# Patient Record
Sex: Male | Born: 1950 | Race: Black or African American | Hispanic: No | Marital: Single | Smoking: Current every day smoker
Health system: Southern US, Community
[De-identification: ages and names within clinical notes are randomized; demographics above are authoritative.]

## PROBLEM LIST (undated history)

## (undated) DIAGNOSIS — I10 Essential (primary) hypertension: Secondary | ICD-10-CM

## (undated) DIAGNOSIS — J9611 Chronic respiratory failure with hypoxia: Secondary | ICD-10-CM

## (undated) DIAGNOSIS — F209 Schizophrenia, unspecified: Secondary | ICD-10-CM

## (undated) DIAGNOSIS — J449 Chronic obstructive pulmonary disease, unspecified: Secondary | ICD-10-CM

## (undated) HISTORY — PX: OTHER SURGICAL HISTORY: SHX169

## (undated) HISTORY — DX: Essential (primary) hypertension: I10

## (undated) HISTORY — PX: COLONOSCOPY: SHX174

## (undated) HISTORY — DX: Schizophrenia, unspecified: F20.9

---

## 2000-12-11 ENCOUNTER — Encounter: Payer: Self-pay | Admitting: Cardiology

## 2000-12-11 ENCOUNTER — Ambulatory Visit (HOSPITAL_COMMUNITY): Admission: RE | Admit: 2000-12-11 | Discharge: 2000-12-11 | Payer: Self-pay | Admitting: Cardiology

## 2001-03-12 ENCOUNTER — Encounter: Payer: Self-pay | Admitting: Emergency Medicine

## 2001-03-12 ENCOUNTER — Emergency Department (HOSPITAL_COMMUNITY): Admission: EM | Admit: 2001-03-12 | Discharge: 2001-03-12 | Payer: Self-pay | Admitting: Emergency Medicine

## 2001-03-12 ENCOUNTER — Encounter: Payer: Self-pay | Admitting: Orthopedic Surgery

## 2001-03-28 ENCOUNTER — Emergency Department (HOSPITAL_COMMUNITY): Admission: EM | Admit: 2001-03-28 | Discharge: 2001-03-28 | Payer: Self-pay | Admitting: Emergency Medicine

## 2001-04-09 ENCOUNTER — Inpatient Hospital Stay (HOSPITAL_COMMUNITY): Admission: EM | Admit: 2001-04-09 | Discharge: 2001-04-10 | Payer: Self-pay

## 2001-06-17 ENCOUNTER — Encounter (HOSPITAL_COMMUNITY): Admission: RE | Admit: 2001-06-17 | Discharge: 2001-07-17 | Payer: Self-pay | Admitting: Orthopedic Surgery

## 2002-04-02 ENCOUNTER — Ambulatory Visit (HOSPITAL_COMMUNITY): Admission: RE | Admit: 2002-04-02 | Discharge: 2002-04-02 | Payer: Self-pay | Admitting: Internal Medicine

## 2002-12-24 ENCOUNTER — Other Ambulatory Visit: Payer: Self-pay

## 2003-02-14 ENCOUNTER — Other Ambulatory Visit: Payer: Self-pay

## 2003-05-03 ENCOUNTER — Other Ambulatory Visit: Payer: Self-pay

## 2003-11-27 ENCOUNTER — Emergency Department: Payer: Self-pay | Admitting: Unknown Physician Specialty

## 2003-12-11 ENCOUNTER — Emergency Department: Payer: Self-pay | Admitting: Emergency Medicine

## 2004-01-24 ENCOUNTER — Emergency Department: Payer: Self-pay | Admitting: Emergency Medicine

## 2005-10-22 ENCOUNTER — Emergency Department (HOSPITAL_COMMUNITY): Admission: EM | Admit: 2005-10-22 | Discharge: 2005-10-23 | Payer: Self-pay | Admitting: Emergency Medicine

## 2005-10-23 ENCOUNTER — Emergency Department (HOSPITAL_COMMUNITY): Admission: EM | Admit: 2005-10-23 | Discharge: 2005-10-23 | Payer: Self-pay | Admitting: Emergency Medicine

## 2008-04-27 ENCOUNTER — Emergency Department: Payer: Self-pay | Admitting: Emergency Medicine

## 2008-06-21 ENCOUNTER — Ambulatory Visit: Payer: Self-pay | Admitting: Family Medicine

## 2008-12-01 ENCOUNTER — Inpatient Hospital Stay: Payer: Self-pay | Admitting: Psychiatry

## 2009-07-23 ENCOUNTER — Emergency Department: Payer: Self-pay | Admitting: Emergency Medicine

## 2012-01-31 ENCOUNTER — Emergency Department: Payer: Self-pay | Admitting: Emergency Medicine

## 2012-01-31 LAB — RAPID INFLUENZA A&B ANTIGENS

## 2013-05-22 ENCOUNTER — Emergency Department: Payer: Self-pay | Admitting: Emergency Medicine

## 2013-05-22 LAB — COMPREHENSIVE METABOLIC PANEL
Albumin: 3.7 g/dL (ref 3.4–5.0)
Alkaline Phosphatase: 114 U/L
Anion Gap: 3 — ABNORMAL LOW (ref 7–16)
BUN: 9 mg/dL (ref 7–18)
Bilirubin,Total: 0.3 mg/dL (ref 0.2–1.0)
CO2: 32 mmol/L (ref 21–32)
CREATININE: 0.92 mg/dL (ref 0.60–1.30)
Calcium, Total: 9.5 mg/dL (ref 8.5–10.1)
Chloride: 107 mmol/L (ref 98–107)
EGFR (Non-African Amer.): >60
Glucose: 109 mg/dL — ABNORMAL HIGH (ref 65–99)
Osmolality: 282 (ref 275–301)
POTASSIUM: 4.4 mmol/L (ref 3.5–5.1)
SGOT(AST): 34 U/L (ref 15–37)
SGPT (ALT): 44 U/L (ref 12–78)
SODIUM: 142 mmol/L (ref 136–145)
Total Protein: 7.3 g/dL (ref 6.4–8.2)

## 2013-05-22 LAB — CBC
HCT: 47.4 % (ref 40.0–52.0)
HGB: 15.2 g/dL (ref 13.0–18.0)
MCH: 29.1 pg (ref 26.0–34.0)
MCHC: 32.1 g/dL (ref 32.0–36.0)
MCV: 91 fL (ref 80–100)
PLATELETS: 237 10*3/uL (ref 150–440)
RBC: 5.23 10*6/uL (ref 4.40–5.90)
RDW: 13.1 % (ref 11.5–14.5)
WBC: 8.7 10*3/uL (ref 3.8–10.6)

## 2013-05-22 LAB — ACETAMINOPHEN LEVEL

## 2013-05-22 LAB — ETHANOL: Ethanol %: <0.003 % (ref 0.000–0.080)

## 2013-05-22 LAB — SALICYLATE LEVEL: SALICYLATES, SERUM: 3.3 mg/dL — AB

## 2013-05-23 LAB — DRUG SCREEN, URINE

## 2013-05-23 LAB — URINALYSIS, COMPLETE
Bacteria: NONE SEEN
Bilirubin,UR: NEGATIVE
Blood: NEGATIVE
Glucose,UR: NEGATIVE mg/dL (ref 0–75)
KETONE: NEGATIVE
Leukocyte Esterase: NEGATIVE
NITRITE: NEGATIVE
PROTEIN: NEGATIVE
Ph: 6 (ref 4.5–8.0)
RBC,UR: NONE SEEN /HPF (ref 0–5)
Specific Gravity: 1.013 (ref 1.003–1.030)

## 2013-11-09 LAB — COMPREHENSIVE METABOLIC PANEL
Albumin: 4.1 g/dL (ref 3.4–5.0)
Alkaline Phosphatase: 113 U/L
Anion Gap: 5 — ABNORMAL LOW (ref 7–16)
BUN: 9 mg/dL (ref 7–18)
Bilirubin,Total: 0.3 mg/dL (ref 0.2–1.0)
Calcium, Total: 9.3 mg/dL (ref 8.5–10.1)
Chloride: 107 mmol/L (ref 98–107)
Co2: 31 mmol/L (ref 21–32)
Creatinine: 1.1 mg/dL (ref 0.60–1.30)
EGFR (African American): >60
EGFR (Non-African Amer.): >60
Glucose: 94 mg/dL (ref 65–99)
Osmolality: 283 (ref 275–301)
Potassium: 4.3 mmol/L (ref 3.5–5.1)
SGOT(AST): 56 U/L — ABNORMAL HIGH (ref 15–37)
SGPT (ALT): 36 U/L
Sodium: 143 mmol/L (ref 136–145)
Total Protein: 7.2 g/dL (ref 6.4–8.2)

## 2013-11-09 LAB — ETHANOL: Ethanol: <3 mg/dL

## 2013-11-09 LAB — DRUG SCREEN, URINE

## 2013-11-09 LAB — CBC
HCT: 51.5 % (ref 40.0–52.0)
HGB: 16.8 g/dL (ref 13.0–18.0)
MCH: 29.8 pg (ref 26.0–34.0)
MCHC: 32.5 g/dL (ref 32.0–36.0)
MCV: 92 fL (ref 80–100)
Platelet: 220 10*3/uL (ref 150–440)
RBC: 5.62 10*6/uL (ref 4.40–5.90)
RDW: 13.1 % (ref 11.5–14.5)
WBC: 9 10*3/uL (ref 3.8–10.6)

## 2013-11-09 LAB — URINALYSIS, COMPLETE
BACTERIA: NONE SEEN
Bilirubin,UR: NEGATIVE
Blood: NEGATIVE
GLUCOSE, UR: NEGATIVE mg/dL (ref 0–75)
KETONE: NEGATIVE
Leukocyte Esterase: NEGATIVE
Nitrite: NEGATIVE
Ph: 6 (ref 4.5–8.0)
Protein: NEGATIVE
RBC,UR: 1 /HPF (ref 0–5)
Specific Gravity: 1.014 (ref 1.003–1.030)
Squamous Epithelial: <1
WBC UR: <1 /HPF (ref 0–5)

## 2013-11-09 LAB — ACETAMINOPHEN LEVEL

## 2013-11-09 LAB — SALICYLATE LEVEL: Salicylates, Serum: 2.5 mg/dL

## 2013-11-10 ENCOUNTER — Inpatient Hospital Stay: Payer: Self-pay | Admitting: Psychiatry

## 2013-11-17 LAB — LIPID PANEL
Cholesterol: 134 mg/dL (ref 0–200)
HDL Cholesterol: 56 mg/dL (ref 40–60)
Ldl Cholesterol, Calc: 51 mg/dL (ref 0–100)
Triglycerides: 135 mg/dL (ref 0–200)
VLDL CHOLESTEROL, CALC: 27 mg/dL (ref 5–40)

## 2013-11-17 LAB — HEMOGLOBIN A1C: Hemoglobin A1C: 5.5 % (ref 4.2–6.3)

## 2013-11-17 LAB — TSH: Thyroid Stimulating Horm: 0.931 u[IU]/mL

## 2014-04-16 ENCOUNTER — Ambulatory Visit
Admit: 2014-04-16 | Disposition: A | Discharge status: Home / Self Care | Payer: Self-pay | Attending: Family Medicine | Admitting: Family Medicine

## 2014-05-08 NOTE — Consult Note (Signed)
PATIENT NAME:  Carlos Nelson, Carlos Nelson MR#:  [id number] DATE OF BIRTH:  08/25/1950  DATE OF CONSULTATION:  05/23/2013  CONSULTING PHYSICIAN:  Mariavictoria Nottingham K. Franchot Mimes, MD  PLACE OF DICTATION: [facility] Emergency Room/BHU, room #2  AGE: 64 years.  SEX: Male.  RACE: African-American.  SUBJECTIVE: The patient is a 64 year old African-American male, not employed, and has been having mental illness for many years. The patient has been living in his current group home for 5 years. The patient reports that he got into an argument with a staff member. He likes the place that he lives. He reports that he refused to take trazodone because Seroquel helps him and he says Seroquel is a good medicine that helps him rest at night, since we were talking about the same.  He had aggressive behavior at the group home.  OBJECTIVE: The patient is alert and oriented, with prompting and help. Calm, pleasant, and cooperative. Cheerful and smiling. No agitation. He admits that sometimes he hears voices, but he is not going to hurt himself or others. Admits that people try to ask him to do things and according to information, he did have delusional thoughts. Denies ideas or plans to hurt himself or others, but does admit getting agitated and upset when people ask him to do things that he does like. Insight and judgment guarded versus poor.  IMPRESSION: Schizophrenia, chronic, paranoid, in partial remission .  PLAN: Continue to adjust medications as follows: Will add Seroquel 200 mg p.o. q.a.m. in addition to Seroquel 400 mg at bedtime. Mr. Sharen Hones is to evaluate the patient on Monday; that is, 05/25/2013, and call back the group home, as he must be stabilized by that time with the help of increasing Seroquel.     ____________________________ Wallace Cullens. Franchot Mimes, MD skc:lm D: 05/23/2013 15:56:20 ET T: 05/23/2013 21:18:26 ET JOB#: [id number]  cc: Arlyn Leak K. Franchot Mimes, MD, <Dictator> Dewain Penning MD ELECTRONICALLY SIGNED 05/25/2013 1:57

## 2014-05-08 NOTE — H&P (Signed)
PATIENT NAME:  LAVIN, PETTEWAY MR#:  [id number] DATE OF BIRTH:  1950-01-26  DATE OF ADMISSION:  11/10/2013   REFERRING PHYSICIAN:  Emergency room M.D.   ATTENDING PHYSICIAN:  Orson Slick, M.D.   IDENTIFYING DATA:  Mr. Shankman is a 64 year old male with history of schizophrenia.   CHIEF COMPLAINT:  "I'm fine."   HISTORY OF PRESENT ILLNESS:  Mr. Mikhail has been a resident of a group home for the past 5 years. He reportedly stopped taking some of his medications at the group home, namely Haldol, but continued to take Seroquel for the past 60 days. He gradually decompensated and became increasingly psychotic with erratic behavior, hallucinating, argumentative, trying to leave the group home in the middle of the night and hallucinating. The patient has been talking to himself constantly yelling at his voices. He was hard to redirect. The patient also complained of depression and feeling anxious. He denied any thoughts of hurting himself or others. The patient was brought to [facility] Emergency Room and was admitted to psychiatry unit. In the hospital he continues to refuse Haldol, but is ready to take the Seroquel. He says that Haldol makes his mind fuzzy. The patient in general denies any problems. He feels that he is ready to return to the group home any time.   PAST PSYCHIATRIC HISTORY: The patient has multiple hospitalizations at [facility]; last time in 2007. He has been in the care of Dr. Kasandra Knudsen in the past, but is unable to tell me who his psychiatrist is now. He was hospitalized at [facility] in 2010 for a similar presentation, psychotic in the context of treatment noncompliance. At that time he was discharged on a combination of Risperdal, trazodone and metoprolol.  The patient was treated with Geodon in the hospital, but his insurance company did not cover the [place] at that time.  The patient is currently on a combination of Seroquel  and Haldol which he refuses.  There is a history of other medication trials, but the patient is unable to name his medicines. There is a history of substance abuse remote. 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   FAMILY PSYCHIATRIC HISTORY: None reported.   PAST MEDICAL HISTORY: Hypertension, history of positive PPD treated with isoniazid.     ALLERGIES: No known drug allergies.   MEDICATIONS ON ADMISSION: Haldol 10 mg twice daily, Norvasc 10 mg daily, Seroquel 100 mg twice daily plus 400 at bedtime.   SOCIAL HISTORY:  Prior to moving into a group home 5 or so years ago, the patient used to live with his sister and his children, but during times of noncompliance he was aggressive towards his family and he ended up in a group home. Apparently, he has a guardian now, which is our department of social services. He lives on [facility] and is allowed to return there.   REVIEW OF SYSTEMS: CONSTITUTIONAL: No fevers or chills. No weight changes.  EYES: No double or blurred vision.  ENT: No hearing loss.  RESPIRATORY: No shortness of breath or cough.  CARDIOVASCULAR: No chest pain or orthopnea.  GASTROINTESTINAL: No abdominal pain, nausea, vomiting or diarrhea.  GENITOURINARY: No incontinence or frequency.  ENDOCRINE: No heat or cold intolerance.  LYMPHATIC: No anemia or easy bruising.  INTEGUMENTARY: No acne or rash.  MUSCULOSKELETAL: No muscle or joint pain.  NEUROLOGIC: No tingling or weakness.  PSYCHIATRIC:  See history of  present illness for details.   PHYSICAL EXAMINATION: VITAL SIGNS: Blood pressure 131/84, pulse 109, respirations 20, temperature 98.5.  GENERAL: This is a well-developed elderly gentleman in no acute distress.  HEENT: The pupils are equal, round and reactive to light. Sclerae anicteric.  NECK: Supple. No thyromegaly.  LUNGS: Clear to auscultation. No dullness to percussion.  HEART: Regular rhythm and rate. No murmurs,  rubs or gallops.  ABDOMEN: Soft, nontender, nondistended. Positive bowel sounds.  MUSCULOSKELETAL:  Normal muscle strength in all extremities.  SKIN: No rashes or bruises.  LYMPHATIC: No cervical adenopathy.  NEUROLOGIC: Cranial nerves II through XII are intact.   LABORATORY DATA: Chemistries are within normal limits. Blood alcohol level is 0. LFTs within normal limits except for AST of 56. Urine tox screen negative for substances.   CBC within normal limits.   Urinalysis is not suggestive of urinary tract infection. Serum acetaminophen and salicylates are low.   MENTAL STATUS EXAMINATION:  On admission the patient is alert and oriented to person and place only. He is super disorganized, rambling. Speech is very difficult to understand. Thinking is disorganized. The patient is really unable to answer most of our questions.  He makes it sure that we understand that he does not want to take Haldol. Seroquel is okay.  He will negotiate other medicines, but never Haldol. He denies thoughts of hurting himself or others. He denies delusions, paranoia or hallucinations but has been observed talking to himself. He is rather restless on the unit walking up and down the hall. Tries to participate in groups, but it does not go well. He complains of feeling anxious and believes that Haldol makes it even worse. His cognition is impossible to assess. He is probably of average intelligence and fund of knowledge. His insight and judgment are extremely poor.   SUICIDE RISK ASSESSMENT:  This is a patient with a long history of psychosis, possibly mood instability, who came to the hospital floridly psychotic in the context of medication noncompliance.   INITIAL DIAGNOSIS:  AXIS I: Schizophrenia.   AXIS II: None.   AXIS III: Hypertension.   PLAN: The patient was admitted to [facility] for safety, stabilization and medication management. He was initially placed on  suicide precautions and was closely monitored for any unsafe behaviors. He underwent full psychiatric and risk assessment. He receives pharmacotherapy, individual and group psychotherapy, substance abuse counseling, and support from therapeutic milieu.    1.  Psychosis: We will continue Seroquel and restart Risperdal that was useful in the past.  It is not Haldol.  2.  Insomnia: In the past the patient had enormous difficulty sleeping.  Will monitor and recommend other sleeping aid or increase Seroquel at night if necessary.  3.  Hypertension. We will continue antihypertensive.   DISPOSITION: He will likely return to the group home. Will have to contact the guardian.    ____________________________ Wardell Honour. Bary Leriche, MD jbp:jw D: 11/10/2013 17:42:00 ET T: 11/10/2013 19:32:34 ET JOB#: [id number]  cc: Jolanta B. Bary Leriche, MD, <Dictator> Clovis Fredrickson MD ELECTRONICALLY SIGNED 11/23/2013 6:27

## 2014-05-08 NOTE — Consult Note (Signed)
Brief Consult Note: Diagnosis: Schizophrenia, Paranoid Type.   Patient was seen by consultant.   Recommend further assessment or treatment.   Orders entered.   Comments: Pt seen for follow up. He appeared anxious and has somewhat rambling speech. Stated that he was able to sleep well last night. Does not like the Trazodone. He has some conflict at the group home. He is not having any aggressive behavior at this time. He will be evaluated by the staff from group home. He denied Si/HI or plans.   Plan.  Adjust his meds Seroquel 100mg  po BID and 400mg  po qhs.  D/C Trazodone.  Will d/c when stable.  Electronic Signatures: Jeronimo Norma (MD)  (Signed 12-May-15 10:34)  Authored: Brief Consult Note   Last Updated: 12-May-15 10:34 by Jeronimo Norma (MD)

## 2015-05-24 ENCOUNTER — Ambulatory Visit (INDEPENDENT_AMBULATORY_CARE_PROVIDER_SITE_OTHER): Payer: Medicare Other | Admitting: Urology

## 2015-05-24 ENCOUNTER — Encounter: Payer: Self-pay | Admitting: Urology

## 2015-05-24 VITALS — BP 174/100 | HR 102 | Ht 64.0 in | Wt 127.3 lb

## 2015-05-24 DIAGNOSIS — R972 Elevated prostate specific antigen [PSA]: Secondary | ICD-10-CM

## 2015-05-24 MED ORDER — FLEET ENEMA 7-19 GM/118ML RE ENEM: 1.0000 | ENEMA | Freq: Once | RECTAL | Status: DC

## 2015-05-24 MED ORDER — SULFAMETHOXAZOLE-TRIMETHOPRIM 800-160 MG PO TABS: 1.0000 | ORAL_TABLET | Freq: Two times a day (BID) | ORAL | Status: DC

## 2015-05-24 NOTE — Progress Notes (Signed)
05/24/2015 3:49 PM   [name] 1950-10-20 [id number]  Referring provider: No referring provider defined for this encounter.  Chief Complaint  Patient presents with  . New Patient (Initial Visit)    elevated PSA    HPI:  1 - Elevated PSA - No FHX prostate cancer. PSA 4.9 03/2015 by PCP labs, recheck still elevated at 4.8 04/2015. DRE 05/2015 20gm and diffusely firm.   PMH sig for schizophrenia / bipolar / lives in assisted living (only for med monitoring, independent in all ADL's and is his own POA), COPD (not limiting), HTN. NO CV disease / blood thinners. No chest or abdominal surgeries.  Today "Nicole Kindred" is seen as new patient for above.     PMH: Past Medical History  Diagnosis Date  . Hypertension   . Schizophrenia [facility])     Surgical History: No past surgical history on file.  Home Medications:    Medication List       This list is accurate as of: 05/24/15  3:49 PM.  Always use your most recent med list.               albuterol 108 (90 Base) MCG/ACT inhaler  Commonly known as:  PROVENTIL HFA;VENTOLIN HFA  Inhale 1 puff into the lungs every 6 (six) hours as needed for wheezing or shortness of breath.     amLODipine 10 MG tablet  Commonly known as:  NORVASC  Take 10 mg by mouth daily.     lisinopril 10 MG tablet  Commonly known as:  PRINIVIL,ZESTRIL  Take 10 mg by mouth daily.     LORazepam 1 MG tablet  Commonly known as:  ATIVAN  Take 1 mg by mouth every 6 (six) hours as needed for anxiety.     metoprolol 50 MG tablet  Commonly known as:  LOPRESSOR  Take 50 mg by mouth 2 (two) times daily.     QUEtiapine 25 MG tablet  Commonly known as:  SEROQUEL  Take 25 mg by mouth at bedtime.     QUEtiapine 400 MG 24 hr tablet  Commonly known as:  SEROQUEL XR  Take 400 mg by mouth at bedtime.     traZODone 50 MG tablet  Commonly known as:  DESYREL  Take 50 mg by mouth at bedtime.     trihexyphenidyl 5 MG tablet  Commonly known as:  ARTANE  Take 5 mg  by mouth 3 (three) times daily with meals.     umeclidinium-vilanterol 62.5-25 MCG/INH Aepb  Commonly known as:  ANORO ELLIPTA  Inhale 1 puff into the lungs daily.        Allergies: No Known Allergies  Family History: No family history on file.  Social History:  has no tobacco, alcohol, and drug history on file.    Review of Systems  Gastrointestinal (upper)  : Negative for upper GI symptoms  Gastrointestinal (lower) : Negative for lower GI symptoms  Constitutional : Negative for symptoms  Skin: Negative for skin symptoms  Eyes: Negative for eye symptoms  Ear/Nose/Throat : Negative for Ear/Nose/Throat symptoms  Hematologic/Lymphatic: Negative for Hematologic/Lymphatic symptoms  Cardiovascular : Negative for cardiovascular symptoms  Respiratory : Negative for respiratory symptoms  Endocrine: Negative for endocrine symptoms  Musculoskeletal: Negative for musculoskeletal symptoms  Neurological: Negative for neurological symptoms  Psychologic: Negative for psychiatric symptoms  Physical Exam: There were no vitals taken for this visit.  Constitutional:  Alert and oriented, No acute distress. HEENT: [place] AT, moist mucus membranes.  Trachea midline, no masses. Cardiovascular: No clubbing, cyanosis, or edema. Respiratory: Normal respiratory effort, no increased work of breathing. GI: Abdomen is soft, nontender, nondistended, no abdominal masses GU: No CVA tenderness. DRE 20gm difusely firm, no frank nodules. Non-fixed.  Skin: No rashes, bruises or suspicious lesions. Lymph: No cervical or inguinal adenopathy. Neurologic: Grossly intact, no focal deficits, moving all 4 extremities. Psychiatric: Normal mood and affect.  Laboratory Data: Lab Results  Component Value Date   WBC 9.0 11/09/2013   HGB 16.8 11/09/2013   HCT 51.5 11/09/2013   MCV 92 11/09/2013   PLT 220 11/09/2013    Lab Results   Component Value Date   CREATININE 1.10 11/09/2013    No results found for: PSA  No results found for: TESTOSTERONE  Lab Results  Component Value Date   HGBA1C 5.5 11/17/2013    Urinalysis    Component Value Date/Time   COLORURINE Yellow 11/09/2013 1525   APPEARANCEUR Clear 11/09/2013 1525   LABSPEC 1.014 11/09/2013 1525   PHURINE 6.0 11/09/2013 1525   GLUCOSEU Negative 11/09/2013 1525   HGBUR Negative 11/09/2013 1525   BILIRUBINUR Negative 11/09/2013 1525   KETONESUR Negative 11/09/2013 1525   PROTEINUR Negative 11/09/2013 1525   NITRITE Negative 11/09/2013 1525   LEUKOCYTESUR Negative 11/09/2013 1525     Assessment & Plan:    1 - Elevated PSA - significant and persistant elevation in man with well controlled comorbidity. Dissussed options of surveillance, biopsy and he opts for biopsy. RIsks, benefits discussed as well as BX course and natural history of prostate cancer.  Bactrim and fleet's [id number] as he will get through facility.  2 - RTC next avail prostate biopsy.     Alexis Frock, [facility] [address] [place], [place] [address] [phone]

## 2015-06-16 ENCOUNTER — Ambulatory Visit: Payer: Medicare Other

## 2015-06-21 ENCOUNTER — Encounter: Payer: Self-pay | Admitting: Urology

## 2015-06-21 ENCOUNTER — Other Ambulatory Visit: Payer: Self-pay | Admitting: Urology

## 2015-06-21 ENCOUNTER — Ambulatory Visit (INDEPENDENT_AMBULATORY_CARE_PROVIDER_SITE_OTHER): Payer: Medicare Other | Admitting: Urology

## 2015-06-21 VITALS — BP 130/83 | HR 91 | Ht 64.0 in | Wt 120.0 lb

## 2015-06-21 DIAGNOSIS — R972 Elevated prostate specific antigen [PSA]: Secondary | ICD-10-CM

## 2015-06-21 MED ORDER — GENTAMICIN SULFATE 40 MG/ML IJ SOLN
80.0000 mg | Freq: Once | INTRAMUSCULAR | Status: AC
  Administered 2015-06-21: 80 mg via INTRAMUSCULAR

## 2015-06-21 NOTE — Progress Notes (Signed)
05/24/2015 3:49 PM   Carlos Nelson 1950/03/28 [id number]  Referring provider: No referring provider defined for this encounter.  Chief Complaint  Patient presents with  . New Patient (Initial Visit)    elevated PSA    HPI:  1 - Elevated PSA - No FHX prostate cancer. PSA 4.9 03/2015 by PCP labs, recheck still elevated at 4.8 04/2015. DRE 05/2015 20gm and diffusely firm. PBX 06/2015 TRUS 35 gm no median lobe.   PMH sig for schizophrenia / bipolar / lives in assisted living (only for med monitoring, independent in all ADL's and is his own POA), COPD (not limiting), HTN. NO CV disease / blood thinners. No chest or abdominal surgeries.  Today "Carlos Nelson" is seen to proceed with prostate biopsy. He took ABX and enema as RX'd.     PMH: Past Medical History  Diagnosis Date  . Hypertension   . Schizophrenia [facility])     Surgical History: No past surgical history on file.  Home Medications:    Medication List       This list is accurate as of: 05/24/15  3:49 PM.  Always use your most recent med list.               albuterol 108 (90 Base) MCG/ACT inhaler  Commonly known as:  PROVENTIL HFA;VENTOLIN HFA  Inhale 1 puff into the lungs every 6 (six) hours as needed for wheezing or shortness of breath.     amLODipine 10 MG tablet  Commonly known as:  NORVASC  Take 10 mg by mouth daily.     lisinopril 10 MG tablet  Commonly known as:  PRINIVIL,ZESTRIL  Take 10 mg by mouth daily.     LORazepam 1 MG tablet  Commonly known as:  ATIVAN  Take 1 mg by mouth every 6 (six) hours as needed for anxiety.     metoprolol 50 MG tablet  Commonly known as:  LOPRESSOR  Take 50 mg by mouth 2 (two) times daily.     QUEtiapine 25 MG tablet  Commonly known as:  SEROQUEL  Take 25 mg by mouth at bedtime.     QUEtiapine 400 MG 24 hr tablet  Commonly known as:  SEROQUEL XR  Take 400 mg by mouth at bedtime.     traZODone 50 MG tablet  Commonly known as:  DESYREL  Take 50 mg by mouth at  bedtime.     trihexyphenidyl 5 MG tablet  Commonly known as:  ARTANE  Take 5 mg by mouth 3 (three) times daily with meals.     umeclidinium-vilanterol 62.5-25 MCG/INH Aepb  Commonly known as:  ANORO ELLIPTA  Inhale 1 puff into the lungs daily.        Allergies: No Known Allergies  Family History: No family history on file.  Social History:  has no tobacco, alcohol, and drug history on file.    Review of Systems  Gastrointestinal (upper)  : Negative for upper GI symptoms  Gastrointestinal (lower) : Negative for lower GI symptoms  Constitutional : Negative for symptoms  Skin: Negative for skin symptoms  Eyes: Negative for eye symptoms  Ear/Nose/Throat : Negative for Ear/Nose/Throat symptoms  Hematologic/Lymphatic: Negative for Hematologic/Lymphatic symptoms  Cardiovascular : Negative for cardiovascular symptoms  Respiratory : Negative for respiratory symptoms  Endocrine: Negative for endocrine symptoms  Musculoskeletal: Negative for musculoskeletal symptoms  Neurological: Negative for neurological symptoms  Psychologic: Negative for psychiatric symptoms            Physical Exam: There  were no vitals taken for this visit.  Constitutional:  Alert and oriented, No acute distress. HEENT: [place] AT, moist mucus membranes.  Trachea midline, no masses. Cardiovascular: No clubbing, cyanosis, or edema. Respiratory: Normal respiratory effort, no increased work of breathing. GI: Abdomen is soft, nontender, nondistended, no abdominal masses GU: No CVA tenderness. DRE 20gm difusely firm, no frank nodules. Non-fixed.  Skin: No rashes, bruises or suspicious lesions. Lymph: No cervical or inguinal adenopathy. Neurologic: Grossly intact, no focal deficits, moving all 4 extremities. Psychiatric: Normal mood and affect.   Prostate Biopsy Procedure   Informed consent was obtained after discussing risks/benefits of the procedure.  A time out was performed to  ensure correct patient identity.  Pre-Procedure: - Last PSA Level: No results found for: PSA - Gentamicin given prophylactically  -Transrectal Ultrasound performed revealing a 35 gm prostate with no median lobe -No significant hypoechoic or median lobe noted  Procedure: - Prostate block performed using 10 cc 1% lidocaine and biopsies taken from sextant areas, a total of 12 under ultrasound guidance.  Post-Procedure: - Patient tolerated the procedure well - He was counseled to seek immediate medical attention if experiences any severe pain, significant bleeding, or fevers - Return in one week to discuss biopsy results    Assessment & Plan:    1 - Elevated PSA - s/p BX today as per above. Warned to contact MD ofr new fevers or urinary retnetion. Expected hematuria / hematochezia discussed.   2 - RTC 2-3 weeks any MD for post-biopsy discussion    Alexis Frock, [place] [address], [place], [place] [address] [phone]

## 2015-06-24 LAB — PATHOLOGY REPORT

## 2015-06-27 ENCOUNTER — Other Ambulatory Visit: Payer: Self-pay | Admitting: Urology

## 2015-07-05 ENCOUNTER — Telehealth: Payer: Self-pay

## 2015-07-05 ENCOUNTER — Ambulatory Visit: Payer: Medicare Other | Admitting: Urology

## 2015-07-05 NOTE — Telephone Encounter (Signed)
Per Dr. Louis Meckel called patient to notify of prostate biopsy results, patient is in [facility], spoke with Mariann Laster (patient's RN) and notified her that results were negative with one core showing High grade PIN. Patient's apt today can be cancelled and rescheduled for 70months with a PSA. She verbalized understanding and stated that Shanon Brow patient's driver would call back to schedule follow up apt.

## 2015-12-13 ENCOUNTER — Other Ambulatory Visit: Payer: Self-pay

## 2015-12-13 DIAGNOSIS — R972 Elevated prostate specific antigen [PSA]: Secondary | ICD-10-CM

## 2015-12-15 ENCOUNTER — Encounter: Payer: Self-pay | Admitting: Urology

## 2015-12-15 ENCOUNTER — Other Ambulatory Visit: Payer: Medicare Other

## 2015-12-19 ENCOUNTER — Ambulatory Visit: Payer: Medicare Other | Admitting: Urology

## 2015-12-19 ENCOUNTER — Encounter: Payer: Self-pay | Admitting: Urology

## 2016-12-12 ENCOUNTER — Encounter: Payer: Self-pay | Admitting: *Deleted

## 2016-12-13 ENCOUNTER — Ambulatory Visit
Admission: RE | Admit: 2016-12-13 | Discharge: 2016-12-13 | Disposition: A | Discharge status: Home / Self Care | Payer: Medicare Other | Source: Ambulatory Visit | Attending: Gastroenterology | Admitting: Gastroenterology

## 2016-12-13 ENCOUNTER — Ambulatory Visit: Payer: Medicare Other | Admitting: Anesthesiology

## 2016-12-13 ENCOUNTER — Encounter: Payer: Self-pay | Admitting: *Deleted

## 2016-12-13 ENCOUNTER — Encounter
Admission: RE | Disposition: A | Discharge status: Home / Self Care | Payer: Self-pay | Source: Ambulatory Visit | Attending: Gastroenterology

## 2016-12-13 DIAGNOSIS — F209 Schizophrenia, unspecified: Secondary | ICD-10-CM | POA: Insufficient documentation

## 2016-12-13 DIAGNOSIS — D124 Benign neoplasm of descending colon: Secondary | ICD-10-CM | POA: Diagnosis not present

## 2016-12-13 DIAGNOSIS — I10 Essential (primary) hypertension: Secondary | ICD-10-CM | POA: Insufficient documentation

## 2016-12-13 DIAGNOSIS — I6930 Unspecified sequelae of cerebral infarction: Secondary | ICD-10-CM | POA: Insufficient documentation

## 2016-12-13 DIAGNOSIS — Q438 Other specified congenital malformations of intestine: Secondary | ICD-10-CM | POA: Insufficient documentation

## 2016-12-13 DIAGNOSIS — F17200 Nicotine dependence, unspecified, uncomplicated: Secondary | ICD-10-CM | POA: Insufficient documentation

## 2016-12-13 DIAGNOSIS — K635 Polyp of colon: Secondary | ICD-10-CM | POA: Diagnosis not present

## 2016-12-13 DIAGNOSIS — K210 Gastro-esophageal reflux disease with esophagitis: Secondary | ICD-10-CM | POA: Diagnosis not present

## 2016-12-13 DIAGNOSIS — Z1211 Encounter for screening for malignant neoplasm of colon: Secondary | ICD-10-CM | POA: Diagnosis present

## 2016-12-13 DIAGNOSIS — J449 Chronic obstructive pulmonary disease, unspecified: Secondary | ICD-10-CM | POA: Insufficient documentation

## 2016-12-13 DIAGNOSIS — Z79899 Other long term (current) drug therapy: Secondary | ICD-10-CM | POA: Diagnosis not present

## 2016-12-13 HISTORY — PX: ESOPHAGOGASTRODUODENOSCOPY (EGD) WITH PROPOFOL: SHX5813

## 2016-12-13 HISTORY — PX: COLONOSCOPY WITH PROPOFOL: SHX5780

## 2016-12-13 SURGERY — ESOPHAGOGASTRODUODENOSCOPY (EGD) WITH PROPOFOL
Anesthesia: General

## 2016-12-13 MED ORDER — PROPOFOL 500 MG/50ML IV EMUL
INTRAVENOUS | Status: AC
  Filled 2016-12-13: qty 50

## 2016-12-13 MED ORDER — EPHEDRINE SULFATE 50 MG/ML IJ SOLN
INTRAMUSCULAR | Status: DC | PRN
  Administered 2016-12-13: 10 mg via INTRAVENOUS

## 2016-12-13 MED ORDER — PROPOFOL 500 MG/50ML IV EMUL
INTRAVENOUS | Status: DC | PRN
  Administered 2016-12-13: 10 ug/kg/min via INTRAVENOUS

## 2016-12-13 MED ORDER — FENTANYL CITRATE (PF) 100 MCG/2ML IJ SOLN
INTRAMUSCULAR | Status: DC | PRN
  Administered 2016-12-13: 50 ug via INTRAVENOUS

## 2016-12-13 MED ORDER — LIDOCAINE HCL (CARDIAC) 20 MG/ML IV SOLN
INTRAVENOUS | Status: DC | PRN
  Administered 2016-12-13: 100 mg via INTRAVENOUS

## 2016-12-13 MED ORDER — MIDAZOLAM HCL 2 MG/2ML IJ SOLN
INTRAMUSCULAR | Status: DC | PRN
  Administered 2016-12-13: 1 mg via INTRAVENOUS

## 2016-12-13 MED ORDER — MIDAZOLAM HCL 2 MG/2ML IJ SOLN
INTRAMUSCULAR | Status: AC
Start: 2016-12-13 — End: ?
  Filled 2016-12-13: qty 2

## 2016-12-13 MED ORDER — PROPOFOL 10 MG/ML IV BOLUS
INTRAVENOUS | Status: DC | PRN
  Administered 2016-12-13: 40 mg via INTRAVENOUS

## 2016-12-13 MED ORDER — EPHEDRINE SULFATE 50 MG/ML IJ SOLN
INTRAMUSCULAR | Status: AC
  Filled 2016-12-13: qty 1

## 2016-12-13 MED ORDER — METOPROLOL TARTRATE 5 MG/5ML IV SOLN
INTRAVENOUS | Status: AC
  Filled 2016-12-13: qty 5

## 2016-12-13 MED ORDER — SODIUM CHLORIDE 0.9 % IV SOLN
INTRAVENOUS | Status: DC
  Administered 2016-12-13 (×2): via INTRAVENOUS

## 2016-12-13 MED ORDER — FENTANYL CITRATE (PF) 100 MCG/2ML IJ SOLN
INTRAMUSCULAR | Status: AC
  Filled 2016-12-13: qty 2

## 2016-12-13 NOTE — Op Note (Signed)
[facility] Patient Name: Carlos Nelson Procedure Date: 12/13/2016 1:22 PM MRN: [id number] Account #: [id number] Date of Birth: 12-14-1950 Admit Type: Outpatient Age: 66 Room: [facility] ENDO ROOM 4 Gender: Male Note Status: Finalized Procedure:            Colonoscopy Indications:          Screening for colorectal malignant neoplasm Providers:            Lollie Sails, MD Referring MD:         Venetia Maxon. Elijio Miles, MD (Referring MD) Medicines:            Monitored Anesthesia Care Complications:        No immediate complications. Procedure:            Pre-Anesthesia Assessment:                       - ASA Grade Assessment: III - A patient with severe                        systemic disease.                       After obtaining informed consent, the colonoscope was                        passed under direct vision. Throughout the procedure,                        the patient's blood pressure, pulse, and oxygen                        saturations were monitored continuously. The                        Colonoscope was introduced through the anus and                        advanced to the the cecum, identified by appendiceal                        orifice and ileocecal valve. The colonoscopy was                        unusually difficult due to poor bowel prep with stool                        present and significant looping. Successful completion                        of the procedure was aided by changing the patient to a                        supine position, changing the patient to a prone                        position, using manual pressure, withdrawing and                        reinserting the scope and lavage. The patient tolerated  the procedure well. The quality of the bowel                        preparation was poor. Findings:      A 2 mm polyp was found in the distal sigmoid colon. The polyp was       sessile.  The polyp was removed with a cold biopsy forceps. Resection and       retrieval were complete.      A 4 mm polyp was found in the descending colon. The polyp was       semi-pedunculated. The polyp was removed with a cold biopsy forceps.       Resection and retrieval were complete. Impression:           - Preparation of the colon was poor.                       - One 2 mm polyp in the distal sigmoid colon, removed                        with a cold biopsy forceps. Resected and retrieved.                       - One 4 mm polyp in the descending colon, removed with                        a cold biopsy forceps. Resected and retrieved. Recommendation:       - Discharge patient to home.                       - Await pathology results.                       - Telephone GI clinic for pathology results in 1 week. Procedure Code(s):    --- Professional ---                       [phone], Colonoscopy, flexible; with biopsy, single or                        multiple Diagnosis Code(s):    --- Professional ---                       Z12.11, Encounter for screening for malignant neoplasm                        of colon                       D12.5, Benign neoplasm of sigmoid colon                       D12.4, Benign neoplasm of descending colon CPT copyright 2016 American Medical Association. All rights reserved. The codes documented in this report are preliminary and upon coder review may  be revised to meet current compliance requirements. Lollie Sails, MD 12/13/2016 2:36:21 PM This report has been signed electronically. Number of Addenda: 0 Note Initiated On: 12/13/2016 1:22 PM Scope Withdrawal Time: 0 hours 12 minutes 44 seconds  Total Procedure Duration: 0 hours 44 minutes 19 seconds       [name]  [facility]

## 2016-12-13 NOTE — Anesthesia Preprocedure Evaluation (Signed)
Anesthesia Evaluation  Patient identified by MRN, date of birth, ID band Patient awake    Reviewed: Allergy & Precautions, H&P , NPO status , Patient's Chart, lab work & pertinent test results  History of Anesthesia Complications Negative for: history of anesthetic complications  Airway Mallampati: III  TM Distance: >3 FB Neck ROM: full    Dental  (+) Chipped, Poor Dentition, Missing   Pulmonary neg shortness of breath, COPD, Current Smoker,           Cardiovascular Exercise Tolerance: Good hypertension, (-) angina(-) Past MI and (-) DOE      Neuro/Psych PSYCHIATRIC DISORDERS Schizophrenia CVA, Residual Symptoms    GI/Hepatic negative GI ROS, Neg liver ROS, neg GERD  ,  Endo/Other  negative endocrine ROS  Renal/GU negative Renal ROS  negative genitourinary   Musculoskeletal   Abdominal   Peds  Hematology negative hematology ROS (+)   Anesthesia Other Findings Past Medical History: No date: Hypertension No date: Schizophrenia ([place])  Past Surgical History: No date: COLONOSCOPY No date: left arm surgery  BMI    Body Mass Index:  23.34 kg/m      Reproductive/Obstetrics negative OB ROS                             Anesthesia Physical Anesthesia Plan  ASA: III  Anesthesia Plan: General   Post-op Pain Management:    Induction: Intravenous  PONV Risk Score and Plan: Propofol infusion  Airway Management Planned: Natural Airway and Nasal Cannula  Additional Equipment:   Intra-op Plan:   Post-operative Plan:   Informed Consent: I have reviewed the patients History and Physical, chart, labs and discussed the procedure including the risks, benefits and alternatives for the proposed anesthesia with the patient or authorized representative who has indicated his/her understanding and acceptance.   Dental Advisory Given  Plan Discussed with: Anesthesiologist, CRNA and  Surgeon  Anesthesia Plan Comments: (Patient consented for risks of anesthesia including but not limited to:  - adverse reactions to medications - risk of intubation if required - damage to teeth, lips or other oral mucosa - sore throat or hoarseness - Damage to heart, brain, lungs or loss of life  Patient voiced understanding.)        Anesthesia Quick Evaluation

## 2016-12-13 NOTE — Anesthesia Procedure Notes (Signed)
Date/Time: 12/13/2016 1:15 PM Performed by: Allean Found, CRNA Pre-anesthesia Checklist: Patient identified, Emergency Drugs available, Suction available, Patient being monitored and Timeout performed Patient Re-evaluated:Patient Re-evaluated prior to induction Oxygen Delivery Method: Nasal cannula Placement Confirmation: positive ETCO2

## 2016-12-13 NOTE — Op Note (Signed)
[facility] Patient Name: Carlos Nelson Procedure Date: 12/13/2016 1:23 PM MRN: [id number] Account #: [id number] Date of Birth: [birth year] Admit Type: Outpatient Age: 66 Room: [facility] ENDO ROOM 4 Gender: Male Note Status: Finalized Procedure:            Upper GI endoscopy Indications:          Follow-up of gastro-esophageal reflux disease Providers:            Lollie Sails, MD Referring MD:         Venetia Maxon. Elijio Miles, MD (Referring MD) Medicines:            Monitored Anesthesia Care Complications:        No immediate complications. Procedure:            Pre-Anesthesia Assessment:                       - ASA Grade Assessment: III - A patient with severe                        systemic disease.                       After obtaining informed consent, the endoscope was                        passed under direct vision. Throughout the procedure,                        the patient's blood pressure, pulse, and oxygen                        saturations were monitored continuously. The Endoscope                        was introduced through the mouth, and advanced to the                        third part of duodenum. The upper GI endoscopy was                        accomplished without difficulty. The patient tolerated                        the procedure well. Findings:      The Z-line was regular. Biopsies were taken with a cold forceps for       histology.      The exam of the esophagus was otherwise normal.      The entire examined stomach was normal.      The cardia and gastric fundus were normal on retroflexion.      The examined duodenum was normal. Impression:           - Z-line regular. Biopsied.                       - Normal stomach.                       - Normal examined duodenum. Recommendation:       - Await pathology results. Procedure Code(s):    --- Professional ---  86825, Esophagogastroduodenoscopy,  flexible, transoral;                        with biopsy, single or multiple Diagnosis Code(s):    --- Professional ---                       K21.9, Gastro-esophageal reflux disease without                        esophagitis CPT copyright 2016 American Medical Association. All rights reserved. The codes documented in this report are preliminary and upon coder review may  be revised to meet current compliance requirements. Lollie Sails, MD 12/13/2016 1:43:47 PM This report has been signed electronically. Number of Addenda: 0 Note Initiated On: 12/13/2016 1:23 PM      [facility]

## 2016-12-13 NOTE — Transfer of Care (Signed)
Immediate Anesthesia Transfer of Care Note  Patient: Carlos Nelson  Procedure(s) Performed: ESOPHAGOGASTRODUODENOSCOPY (EGD) WITH PROPOFOL (N/A ) COLONOSCOPY WITH PROPOFOL (N/A )  Patient Location: PACU  Anesthesia Type:MAC  Level of Consciousness: awake  Airway & Oxygen Therapy: Patient Spontanous Breathing  Post-op Assessment: Report given to RN and Post -op Vital signs reviewed and stable  Post vital signs: Reviewed and stable  Last Vitals:  Vitals:   12/13/16 1238 12/13/16 1440  BP: (!) 124/95 114/76  Pulse: 80 83  Resp: 20 17  Temp: (!) 35.9 C 36.6 C  SpO2: 99% 96%    Last Pain:  Vitals:   12/13/16 1440  TempSrc: Tympanic  PainSc: Asleep         Complications: No apparent anesthesia complications

## 2016-12-13 NOTE — Anesthesia Post-op Follow-up Note (Signed)
Anesthesia QCDR form completed.        

## 2016-12-13 NOTE — H&P (Signed)
Outpatient short stay form Pre-procedure 12/13/2016 1:06 PM Lollie Sails MD  Primary Physician: Dr Elijio Miles  Reason for visit:  EGD and colonoscopy  History of present illness:  Patient is a 66 year old male presenting today as above. His personal history of Esophageal reflux and he is presenting for colon cancer screening as well. Patient does take a daily NSAID, meloxicam, but is not currently taking a proton pump inhibitor. He tolerated his prep well. He takes no aspirin or blood thinning agents.    Current Facility-Administered Medications:  .  0.9 %  sodium chloride infusion, , Intravenous, Continuous, Lollie Sails, MD, Last Rate: 20 mL/hr at 12/13/16 1257  Medications Prior to Admission  Medication Sig Dispense Refill Last Dose  . albuterol (PROVENTIL HFA;VENTOLIN HFA) 108 (90 Base) MCG/ACT inhaler Inhale 1 puff into the lungs every 6 (six) hours as needed for wheezing or shortness of breath.   12/12/2016 at Unknown time  . amLODipine (NORVASC) 10 MG tablet Take 10 mg by mouth daily.   12/12/2016 at Unknown time  . lisinopril (PRINIVIL,ZESTRIL) 10 MG tablet Take 10 mg by mouth daily.   12/12/2016 at Unknown time  . LORazepam (ATIVAN) 1 MG tablet Take 1 mg by mouth every 6 (six) hours as needed for anxiety.   12/12/2016 at Unknown time  . metoprolol (LOPRESSOR) 50 MG tablet Take 50 mg by mouth 2 (two) times daily.   12/12/2016 at Unknown time  . QUEtiapine (SEROQUEL XR) 400 MG 24 hr tablet Take 400 mg by mouth at bedtime.   12/12/2016 at Unknown time  . umeclidinium-vilanterol (ANORO ELLIPTA) 62.5-25 MCG/INH AEPB Inhale 1 puff into the lungs daily.   12/12/2016 at Unknown time  . QUEtiapine (SEROQUEL) 25 MG tablet Take 25 mg by mouth at bedtime.   Taking  . sodium phosphate (FLEET) 7-19 GM/118ML ENEM Place 133 mLs (1 enema total) rectally once. On morning of prostate biopsy (Patient not taking: Reported on 06/21/2015) 1 enema 0 Not Taking  . sulfamethoxazole-trimethoprim  (BACTRIM DS,SEPTRA DS) 800-160 MG tablet Take 1 tablet by mouth 2 (two) times daily. X 3 days. Begin day prior to prostate biopsy 6 tablet 0 Taking  . traZODone (DESYREL) 50 MG tablet Take 50 mg by mouth at bedtime.   Not Taking at Unknown time  . trihexyphenidyl (ARTANE) 5 MG tablet Take 5 mg by mouth 3 (three) times daily with meals.   Taking     No Known Allergies   Past Medical History:  Diagnosis Date  . Hypertension   . Schizophrenia ([place])     Review of systems:      Physical Exam    Heart and lungs: Regular rate and rhythm without rub or gallop, lungs are bilaterally clear.    HEENT: Normocephalic atraumatic eyes are anicteric    Other:     Pertinant exam for procedure: Soft mild fullness sensation to patient however his abdomen is soft nontender bowel sounds are positive normoactive.    Planned proceedures: EGD, colonoscopy and indicated procedures. I have discussed the risks benefits and complications of procedures to include not limited to bleeding, infection, perforation and the risk of sedation and the patient wishes to proceed.    Lollie Sails, MD Gastroenterology 12/13/2016  1:06 PM

## 2016-12-14 NOTE — Anesthesia Postprocedure Evaluation (Signed)
Anesthesia Post Note  Patient: Carlos Nelson  Procedure(s) Performed: ESOPHAGOGASTRODUODENOSCOPY (EGD) WITH PROPOFOL (N/A ) COLONOSCOPY WITH PROPOFOL (N/A )  Patient location during evaluation: PACU Anesthesia Type: General Level of consciousness: awake and alert Pain management: pain level controlled Vital Signs Assessment: post-procedure vital signs reviewed and stable Respiratory status: spontaneous breathing, nonlabored ventilation, respiratory function stable and patient connected to nasal cannula oxygen Cardiovascular status: blood pressure returned to baseline and stable Postop Assessment: no apparent nausea or vomiting Anesthetic complications: no     Last Vitals:  Vitals:   12/13/16 1510 12/13/16 1520  BP: (!) 138/96 (!) 162/88  Pulse: 74 74  Resp: 20 (!) 21  Temp:    SpO2: 94% 96%    Last Pain:  Vitals:   12/13/16 1450  TempSrc:   PainSc: Asleep                 Precious Haws [name]

## 2016-12-27 LAB — SURGICAL PATHOLOGY

## 2018-04-05 ENCOUNTER — Encounter: Payer: Self-pay | Admitting: Emergency Medicine

## 2018-04-05 ENCOUNTER — Emergency Department: Payer: Medicare Other

## 2018-04-05 ENCOUNTER — Emergency Department
Admission: EM | Admit: 2018-04-05 | Discharge: 2018-04-05 | Disposition: A | Discharge status: Home / Self Care | Payer: Medicare Other | Attending: Emergency Medicine | Admitting: Emergency Medicine

## 2018-04-05 ENCOUNTER — Other Ambulatory Visit: Payer: Self-pay

## 2018-04-05 DIAGNOSIS — T783XXA Angioneurotic edema, initial encounter: Secondary | ICD-10-CM | POA: Insufficient documentation

## 2018-04-05 DIAGNOSIS — R600 Localized edema: Secondary | ICD-10-CM | POA: Diagnosis present

## 2018-04-05 DIAGNOSIS — I10 Essential (primary) hypertension: Secondary | ICD-10-CM | POA: Insufficient documentation

## 2018-04-05 DIAGNOSIS — F17210 Nicotine dependence, cigarettes, uncomplicated: Secondary | ICD-10-CM | POA: Insufficient documentation

## 2018-04-05 HISTORY — DX: Chronic obstructive pulmonary disease, unspecified: J44.9

## 2018-04-05 LAB — CBC WITH DIFFERENTIAL/PLATELET
ABS IMMATURE GRANULOCYTES: 0.04 10*3/uL (ref 0.00–0.07)
BASOS PCT: 0 %
Basophils Absolute: 0 10*3/uL (ref 0.0–0.1)
Eosinophils Absolute: 0 10*3/uL (ref 0.0–0.5)
Eosinophils Relative: 0 %
HCT: 44.6 % (ref 39.0–52.0)
HEMOGLOBIN: 14.8 g/dL (ref 13.0–17.0)
Immature Granulocytes: 0 %
LYMPHS PCT: 17 %
Lymphs Abs: 2.1 10*3/uL (ref 0.7–4.0)
MCH: 30 pg (ref 26.0–34.0)
MCHC: 33.2 g/dL (ref 30.0–36.0)
MCV: 90.5 fL (ref 80.0–100.0)
MONO ABS: 0.8 10*3/uL (ref 0.1–1.0)
MONOS PCT: 6 %
NEUTROS PCT: 77 %
Neutro Abs: 8.9 10*3/uL — ABNORMAL HIGH (ref 1.7–7.7)
PLATELETS: 205 10*3/uL (ref 150–400)
RBC: 4.93 MIL/uL (ref 4.22–5.81)
RDW: 12.3 % (ref 11.5–15.5)
WBC: 11.8 10*3/uL — AB (ref 4.0–10.5)
nRBC: 0 % (ref 0.0–0.2)

## 2018-04-05 LAB — COMPREHENSIVE METABOLIC PANEL
ALBUMIN: 4 g/dL (ref 3.5–5.0)
ALK PHOS: 54 U/L (ref 38–126)
ALT: 32 U/L (ref 0–44)
AST: 32 U/L (ref 15–41)
Anion gap: 9 (ref 5–15)
BUN: 19 mg/dL (ref 8–23)
CALCIUM: 9.4 mg/dL (ref 8.9–10.3)
CHLORIDE: 101 mmol/L (ref 98–111)
CO2: 26 mmol/L (ref 22–32)
CREATININE: 1.46 mg/dL — AB (ref 0.61–1.24)
GFR calc Af Amer: 56 mL/min — ABNORMAL LOW (ref >60)
GFR calc non Af Amer: 49 mL/min — ABNORMAL LOW (ref >60)
GLUCOSE: 133 mg/dL — AB (ref 70–99)
Potassium: 4.4 mmol/L (ref 3.5–5.1)
SODIUM: 136 mmol/L (ref 135–145)
Total Bilirubin: 1.1 mg/dL (ref 0.3–1.2)
Total Protein: 7 g/dL (ref 6.5–8.1)

## 2018-04-05 MED ORDER — DIPHENHYDRAMINE HCL 25 MG PO TABS: 25.0000 mg | ORAL_TABLET | Freq: Four times a day (QID) | ORAL | 0 refills | Status: DC | PRN

## 2018-04-05 MED ORDER — PREDNISONE 10 MG (21) PO TBPK: ORAL_TABLET | ORAL | 0 refills | Status: DC

## 2018-04-05 MED ORDER — METHYLPREDNISOLONE SODIUM SUCC 125 MG IJ SOLR
125.0000 mg | Freq: Once | INTRAMUSCULAR | Status: AC
  Administered 2018-04-05: 125 mg via INTRAVENOUS
  Filled 2018-04-05: qty 2

## 2018-04-05 MED ORDER — IPRATROPIUM-ALBUTEROL 0.5-2.5 (3) MG/3ML IN SOLN
3.0000 mL | Freq: Once | RESPIRATORY_TRACT | Status: AC
  Administered 2018-04-05: 3 mL via RESPIRATORY_TRACT
  Filled 2018-04-05: qty 3

## 2018-04-05 MED ORDER — FAMOTIDINE IN NACL 20-0.9 MG/50ML-% IV SOLN
20.0000 mg | Freq: Once | INTRAVENOUS | Status: AC
  Administered 2018-04-05: 20 mg via INTRAVENOUS
  Filled 2018-04-05: qty 50

## 2018-04-05 MED ORDER — AMLODIPINE BESYLATE 10 MG PO TABS: 10.0000 mg | ORAL_TABLET | Freq: Every day | ORAL | 11 refills | Status: DC

## 2018-04-05 MED ORDER — DIPHENHYDRAMINE HCL 50 MG/ML IJ SOLN
50.0000 mg | Freq: Once | INTRAMUSCULAR | Status: AC
Start: 2018-04-05 — End: 2018-04-05
  Administered 2018-04-05: 50 mg via INTRAVENOUS
  Filled 2018-04-05: qty 1

## 2018-04-05 NOTE — ED Triage Notes (Signed)
Pt to ED via POV with caregiver from family care home. Pt has swelling in his lips face. Pt states that he is short of breath but that this has been going on for a while. Pt is in NAD.

## 2018-04-05 NOTE — ED Provider Notes (Signed)
[facility] Emergency Department Provider Note       Time seen: ----------------------------------------- 2:48 PM on 04/05/2018 -----------------------------------------   I have reviewed the triage vital signs and the nursing notes.  HISTORY   Chief Complaint No chief complaint on file.    HPI Carlos Nelson is a 68 y.o. male with a history of hypertension and schizophrenia who presents to the ED for swelling to his lips and face.  Patient states he is short of breath that has been going on for a while.  He does take an ACE inhibitor, also is taking a new medicine over the past 2 weeks for tardive dyskinesia.  Family or caregivers are not sure if this could be the cause.  He has not had any recent illness or other complaints.  Past Medical History:  Diagnosis Date  . Hypertension   . Schizophrenia [facility])     Patient Active Problem List   Diagnosis Date Noted  . Elevated PSA 05/24/2015    Past Surgical History:  Procedure Laterality Date  . COLONOSCOPY    . COLONOSCOPY WITH PROPOFOL N/A 12/13/2016   Procedure: COLONOSCOPY WITH PROPOFOL;  Surgeon: Lollie Sails, MD;  Location[facility] ENDOSCOPY;  Service: Endoscopy;  Laterality: N/A;  . ESOPHAGOGASTRODUODENOSCOPY (EGD) WITH PROPOFOL N/A 12/13/2016   Procedure: ESOPHAGOGASTRODUODENOSCOPY (EGD) WITH PROPOFOL;  Surgeon: Lollie Sails, MD;  Location: [facility] ENDOSCOPY;  Service: Endoscopy;  Laterality: N/A;  . left arm surgery      Allergies Patient has no known allergies.  Social History Social History   Tobacco Use  . Smoking status: Current Every Day Smoker    Packs/day: 1.00    Types: Cigarettes  . Smokeless tobacco: Never Used  Substance Use Topics  . Alcohol use: Yes    Alcohol/week: 0.0 standard drinks    Comment: beer  . Drug use: Yes    Comment: marjuana   Review of Systems Constitutional: Negative for fever. ENT: Positive for lip swelling, facial  swelling Cardiovascular: Negative for chest pain. Respiratory: Positive for shortness of breath Gastrointestinal: Negative for abdominal pain, vomiting and diarrhea. Musculoskeletal: Negative for back pain. Skin: Positive for hives Neurological: Negative for headaches, focal weakness or numbness.  All systems negative/normal/unremarkable except as stated in the HPI  ____________________________________________   PHYSICAL EXAM:  VITAL SIGNS: ED Triage Vitals  Enc Vitals Group     BP      Pulse      Resp      Temp      Temp src      SpO2      Weight      Height      Head Circumference      Peak Flow      Pain Score      Pain Loc      Pain Edu?      Excl. in [place]?    Constitutional: Alert and oriented. Well appearing and in no distress. Eyes: Conjunctivae are normal. Normal extraocular movements. ENT      Head: Normocephalic and atraumatic.  Mild periorbital edema is noted, worse on the right      Nose: No congestion/rhinnorhea.      Mouth/Throat: Mucous membranes are moist.,  Lower lip diffuse swelling is noted      Neck: No stridor. Cardiovascular: Normal rate, regular rhythm. No murmurs, rubs, or gallops. Respiratory: Mild wheezing is noted, no tachypnea Gastrointestinal: Soft and nontender. Normal bowel sounds Musculoskeletal: Nontender with normal range of motion  in extremities. No lower extremity tenderness nor edema. Neurologic:  Normal speech and language. No gross focal neurologic deficits are appreciated.  Skin: Urticaria is appreciated on the skin Psychiatric: Mood and affect are normal. Speech and behavior are normal.  ___________________________________________  ED COURSE:  As part of my medical decision making, I reviewed the following data within the [place] History obtained from family if available, nursing notes, old chart and ekg, as well as notes from prior ED visits. Patient presented for angioedema, we will assess with labs and  imaging as indicated at this time.   Procedures ____________________________________________   LABS (pertinent positives/negatives)  Labs Reviewed  CBC WITH DIFFERENTIAL/PLATELET - Abnormal; Notable for the following components:      Result Value   WBC 11.8 (*)    Neutro Abs 8.9 (*)    All other components within normal limits  COMPREHENSIVE METABOLIC PANEL - Abnormal; Notable for the following components:   Glucose, Bld 133 (*)    Creatinine, Ser 1.46 (*)    GFR calc non Af Amer 49 (*)    GFR calc Af Amer 56 (*)    All other components within normal limits    RADIOLOGY Images were viewed by me  Chest x-ray IMPRESSION: No acute abnormality noted. ____________________________________________   DIFFERENTIAL DIAGNOSIS   Angioedema, allergic reaction, pneumonia  FINAL ASSESSMENT AND PLAN  Angioedema   Plan: The patient had presented for facial and lip swelling. Patient's labs did not reveal any acute process. Patient's imaging was reassuring.  He did not have any worsening in his angioedema.  I will stop his benazepril and also stop his new tardive dyskinesia medication in case that is worsening his symptoms.  He be discharged with Benadryl and steroids.   Laurence Aly, MD    Note: This note was generated in part or whole with voice recognition software. Voice recognition is usually quite accurate but there are transcription errors that can and very often do occur. I apologize for any typographical errors that were not detected and corrected.     Earleen Newport, MD 04/05/18 1739

## 2018-08-16 ENCOUNTER — Encounter: Payer: Self-pay | Admitting: Emergency Medicine

## 2018-08-16 ENCOUNTER — Other Ambulatory Visit: Payer: Self-pay

## 2018-08-16 ENCOUNTER — Emergency Department
Admission: EM | Admit: 2018-08-16 | Discharge: 2018-08-17 | Disposition: A | Discharge status: Home / Self Care | Payer: Medicare Other | Attending: Emergency Medicine | Admitting: Emergency Medicine

## 2018-08-16 DIAGNOSIS — Z79899 Other long term (current) drug therapy: Secondary | ICD-10-CM | POA: Diagnosis not present

## 2018-08-16 DIAGNOSIS — Z20828 Contact with and (suspected) exposure to other viral communicable diseases: Secondary | ICD-10-CM | POA: Insufficient documentation

## 2018-08-16 DIAGNOSIS — R440 Auditory hallucinations: Secondary | ICD-10-CM | POA: Diagnosis not present

## 2018-08-16 DIAGNOSIS — R462 Strange and inexplicable behavior: Secondary | ICD-10-CM

## 2018-08-16 DIAGNOSIS — F209 Schizophrenia, unspecified: Secondary | ICD-10-CM | POA: Diagnosis present

## 2018-08-16 DIAGNOSIS — F23 Brief psychotic disorder: Secondary | ICD-10-CM | POA: Diagnosis not present

## 2018-08-16 LAB — CBC
HCT: 47.1 % (ref 39.0–52.0)
Hemoglobin: 15.9 g/dL (ref 13.0–17.0)
MCH: 29.9 pg (ref 26.0–34.0)
MCHC: 33.8 g/dL (ref 30.0–36.0)
MCV: 88.7 fL (ref 80.0–100.0)
Platelets: 276 10*3/uL (ref 150–400)
RBC: 5.31 MIL/uL (ref 4.22–5.81)
RDW: 12.1 % (ref 11.5–15.5)
WBC: 11.4 10*3/uL — ABNORMAL HIGH (ref 4.0–10.5)
nRBC: 0 % (ref 0.0–0.2)

## 2018-08-16 LAB — COMPREHENSIVE METABOLIC PANEL
ALT: 87 U/L — ABNORMAL HIGH (ref 0–44)
AST: 47 U/L — ABNORMAL HIGH (ref 15–41)
Albumin: 4.4 g/dL (ref 3.5–5.0)
Alkaline Phosphatase: 76 U/L (ref 38–126)
Anion gap: 13 (ref 5–15)
BUN: 19 mg/dL (ref 8–23)
CO2: 29 mmol/L (ref 22–32)
Calcium: 10.1 mg/dL (ref 8.9–10.3)
Chloride: 98 mmol/L (ref 98–111)
Creatinine, Ser: 1.33 mg/dL — ABNORMAL HIGH (ref 0.61–1.24)
GFR calc Af Amer: >60 mL/min (ref >60)
GFR calc non Af Amer: 54 mL/min — ABNORMAL LOW (ref >60)
Glucose, Bld: 116 mg/dL — ABNORMAL HIGH (ref 70–99)
Potassium: 3.4 mmol/L — ABNORMAL LOW (ref 3.5–5.1)
Sodium: 140 mmol/L (ref 135–145)
Total Bilirubin: 1 mg/dL (ref 0.3–1.2)
Total Protein: 7.8 g/dL (ref 6.5–8.1)

## 2018-08-16 LAB — URINE DRUG SCREEN, QUALITATIVE (ARMC ONLY)
Amphetamines, Ur Screen: NOT DETECTED
Barbiturates, Ur Screen: NOT DETECTED
Benzodiazepine, Ur Scrn: NOT DETECTED
Cannabinoid 50 Ng, Ur ~~LOC~~: NOT DETECTED
Cocaine Metabolite,Ur ~~LOC~~: NOT DETECTED
MDMA (Ecstasy)Ur Screen: NOT DETECTED
Methadone Scn, Ur: NOT DETECTED
Opiate, Ur Screen: NOT DETECTED
Phencyclidine (PCP) Ur S: NOT DETECTED
Tricyclic, Ur Screen: POSITIVE — AB

## 2018-08-16 LAB — SARS CORONAVIRUS 2 BY RT PCR (HOSPITAL ORDER, PERFORMED IN ~~LOC~~ HOSPITAL LAB): SARS Coronavirus 2: NEGATIVE

## 2018-08-16 LAB — SALICYLATE LEVEL: Salicylate Lvl: <7 mg/dL (ref 2.8–30.0)

## 2018-08-16 LAB — ETHANOL: Alcohol, Ethyl (B): <10 mg/dL (ref <10)

## 2018-08-16 LAB — ACETAMINOPHEN LEVEL: Acetaminophen (Tylenol), Serum: <10 ug/mL — ABNORMAL LOW (ref 10–30)

## 2018-08-16 MED ORDER — TRIHEXYPHENIDYL HCL 5 MG PO TABS
5.0000 mg | ORAL_TABLET | Freq: Three times a day (TID) | ORAL | Status: DC
  Administered 2018-08-17: 5 mg via ORAL
  Filled 2018-08-16 (×4): qty 1

## 2018-08-16 MED ORDER — UMECLIDINIUM-VILANTEROL 62.5-25 MCG/INH IN AEPB
1.0000 | INHALATION_SPRAY | Freq: Every day | RESPIRATORY_TRACT | Status: DC
  Administered 2018-08-17: 1 via RESPIRATORY_TRACT
  Filled 2018-08-16: qty 14

## 2018-08-16 MED ORDER — LORAZEPAM 1 MG PO TABS: 1.0000 mg | ORAL_TABLET | Freq: Four times a day (QID) | ORAL | Status: DC | PRN

## 2018-08-16 MED ORDER — AMLODIPINE BESYLATE 5 MG PO TABS
10.0000 mg | ORAL_TABLET | Freq: Every day | ORAL | Status: DC
  Administered 2018-08-16 – 2018-08-17 (×2): 10 mg via ORAL
  Filled 2018-08-16 (×2): qty 2

## 2018-08-16 MED ORDER — SODIUM CHLORIDE 0.9 % IV BOLUS
1000.0000 mL | Freq: Once | INTRAVENOUS | Status: AC
  Administered 2018-08-16: 1000 mL via INTRAVENOUS

## 2018-08-16 MED ORDER — QUETIAPINE FUMARATE 25 MG PO TABS
25.0000 mg | ORAL_TABLET | Freq: Every day | ORAL | Status: DC
  Filled 2018-08-16: qty 1

## 2018-08-16 MED ORDER — ALBUTEROL SULFATE (2.5 MG/3ML) 0.083% IN NEBU: 2.5000 mg | INHALATION_SOLUTION | Freq: Four times a day (QID) | RESPIRATORY_TRACT | Status: DC | PRN

## 2018-08-16 MED ORDER — DIVALPROEX SODIUM ER 250 MG PO TB24
250.0000 mg | ORAL_TABLET | Freq: Every day | ORAL | Status: DC
  Administered 2018-08-16 – 2018-08-17 (×2): 250 mg via ORAL
  Filled 2018-08-16 (×2): qty 1

## 2018-08-16 MED ORDER — TRAZODONE HCL 50 MG PO TABS
50.0000 mg | ORAL_TABLET | Freq: Every day | ORAL | Status: DC
  Filled 2018-08-16: qty 1

## 2018-08-16 MED ORDER — ACETAMINOPHEN 325 MG PO TABS: 650.0000 mg | ORAL_TABLET | ORAL | Status: DC | PRN

## 2018-08-16 MED ORDER — METOPROLOL TARTRATE 50 MG PO TABS
50.0000 mg | ORAL_TABLET | Freq: Two times a day (BID) | ORAL | Status: DC
  Administered 2018-08-16 – 2018-08-17 (×2): 50 mg via ORAL
  Filled 2018-08-16 (×3): qty 1

## 2018-08-16 MED ORDER — QUETIAPINE FUMARATE ER 200 MG PO TB24
400.0000 mg | ORAL_TABLET | Freq: Every day | ORAL | Status: DC
  Filled 2018-08-16 (×2): qty 2

## 2018-08-16 MED ORDER — FLEET ENEMA 7-19 GM/118ML RE ENEM
1.0000 | ENEMA | Freq: Once | RECTAL | Status: AC
  Administered 2018-08-16: 1 via RECTAL
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
## 2018-08-16 NOTE — BH Assessment (Signed)
Writer made several attempts to contact patient's Group Home but have been unsuccessful. The numbers are not correct in the system. The one return phone call([phone]) was from someone who retired from group home work approximately two years ago.  Write called [facility] Emergency Dispatch [phone]) and request law enforcement to go to the home and have [place] staff to contact ER.
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
## 2018-08-16 NOTE — ED Notes (Signed)
Pt assisted to Toilet to have BM.
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
## 2018-08-16 NOTE — ED Notes (Signed)
IVC psych dispo

## 2018-08-16 NOTE — Consult Note (Signed)
[facility] Face-to-Face Psychiatry Consult   Reason for Consult:  Hallucinations Referring Physician:  EDP Patient Identification: SEMISI BIELA MRN:  [id number] Principal Diagnosis: <principal problem not specified> Diagnosis:  Active Problems:   * No active hospital problems. *   Total Time spent with patient: 45 minutes  Subjective:   Carlos Nelson is a 69 y.o. male patient reports that he came to the hospital because he is having some hallucinations.  He reports that he has had hallucinations in the past but it was in the 1970s.  Patient states "I have lost my mind.  I lost my mind back in the 70s to."  Patient reports that he has not slept in approximately 4 weeks but when he does sleep that is usually 4 at the max about 30 minutes.  He reports that he has been living at [facility] for the last 11 years.  Patient reports that he does not like dogs because dogs do not like him.  Patient feels that he needs to be admitted to the hospital due to his hallucinations and reports that he has not had any in a while and this scares him.  HPI:  Per Dr. Joni Fears: 68 y.o. male with a history of COPD hypertension and schizophrenia who is brought to the ED under involuntary commitment.  He reports last night "I think I went crazy, I tore the house up."  Reports that he does not drink.  Last cocaine use was 8 months ago.  He has been using marijuana daily for the past 4 months.  Denies any pain or other symptoms.  He endorses disorganized thoughts and auditory hallucinations.  Patient is seen by this provider face-to-face.  Patient is pleasant and cooperative during evaluation.  Patient makes minimal eye contact and his speech is very difficult to understand and he has to be asked repeatedly to repeat the same answers.  Due to patient's risk factors with age and reporting sudden onset of auditory hallucinations that are scaring him and is concerning to the patient feel that patient meets  inpatient criteria.  Past Psychiatric History: Hallucinations  Risk to Self:   Risk to Others:   Prior Inpatient Therapy:   Prior Outpatient Therapy:    Past Medical History:  Past Medical History:  Diagnosis Date  . COPD (chronic obstructive pulmonary disease) ([place])   . Hypertension   . Schizophrenia [facility])     Past Surgical History:  Procedure Laterality Date  . COLONOSCOPY    . COLONOSCOPY WITH PROPOFOL N/A 12/13/2016   Procedure: COLONOSCOPY WITH PROPOFOL;  Surgeon: Lollie Sails, MD;  Location: [facility] ENDOSCOPY;  Service: Endoscopy;  Laterality: N/A;  . ESOPHAGOGASTRODUODENOSCOPY (EGD) WITH PROPOFOL N/A 12/13/2016   Procedure: ESOPHAGOGASTRODUODENOSCOPY (EGD) WITH PROPOFOL;  Surgeon: Lollie Sails, MD;  Location: [facility] ENDOSCOPY;  Service: Endoscopy;  Laterality: N/A;  . left arm surgery     Family History:  Family History  Problem Relation Age of Onset  . Prostate cancer Neg Hx   . Bladder Cancer Neg Hx    Family Psychiatric  History: Denies Social History:  Social History   Substance and Sexual Activity  Alcohol Use Yes  . Alcohol/week: 0.0 standard drinks   Comment: beer     Social History   Substance and Sexual Activity  Drug Use Yes   Comment: marjuana    Social History   Socioeconomic History  . Marital status: Single    Spouse name: Not on file  . Number  of children: Not on file  . Years of education: Not on file  . Highest education level: Not on file  Occupational History  . Not on file  Social Needs  . Financial resource strain: Not on file  . Food insecurity    Worry: Not on file    Inability: Not on file  . Transportation needs    Medical: Not on file    Non-medical: Not on file  Tobacco Use  . Smoking status: Current Every Day Smoker    Packs/day: 1.00    Types: Cigarettes  . Smokeless tobacco: Never Used  Substance and Sexual Activity  . Alcohol use: Yes    Alcohol/week: 0.0 standard drinks    Comment: beer  . Drug use:  Yes    Comment: marjuana  . Sexual activity: Not on file  Lifestyle  . Physical activity    Days per week: Not on file    Minutes per session: Not on file  . Stress: Not on file  Relationships  . Social Herbalist on phone: Not on file    Gets together: Not on file    Attends religious service: Not on file    Active member of club or organization: Not on file    Attends meetings of clubs or organizations: Not on file    Relationship status: Not on file  Other Topics Concern  . Not on file  Social History Narrative  . Not on file   Additional Social History:    Allergies:   Allergies  Allergen Reactions  . Ace Inhibitors Swelling    Labs:  Results for orders placed or performed during the hospital encounter of 08/16/18 (from the past 48 hour(s))  Comprehensive metabolic panel     Status: Abnormal   Collection Time: 08/16/18 11:13 AM  Result Value Ref Range   Sodium 140 135 - 145 mmol/L   Potassium 3.4 (L) 3.5 - 5.1 mmol/L   Chloride 98 98 - 111 mmol/L   CO2 29 22 - 32 mmol/L   Glucose, Bld 116 (H) 70 - 99 mg/dL   BUN 19 8 - 23 mg/dL   Creatinine, Ser 1.33 (H) 0.61 - 1.24 mg/dL   Calcium 10.1 8.9 - 10.3 mg/dL   Total Protein 7.8 6.5 - 8.1 g/dL   Albumin 4.4 3.5 - 5.0 g/dL   AST 47 (H) 15 - 41 U/L   ALT 87 (H) 0 - 44 U/L   Alkaline Phosphatase 76 38 - 126 U/L   Total Bilirubin 1.0 0.3 - 1.2 mg/dL   GFR calc non Af Amer 54 (L) >60 mL/min   GFR calc Af Amer >60 >60 mL/min   Anion gap 13 5 - 15    Comment: Performed at [facility], [address] [place], [place] [address]  Ethanol     Status: None   Collection Time: 08/16/18 11:13 AM  Result Value Ref Range   Alcohol, Ethyl (B) <10 <10 mg/dL    Comment: (NOTE) Lowest detectable limit for serum alcohol is 10 mg/dL. For medical purposes only. Performed at [facility], [address] [place], [place] [address]   Salicylate level     Status: None   Collection Time: 08/16/18  11:13 AM  Result Value Ref Range   Salicylate Lvl <0.1 2.8 - 30.0 mg/dL    Comment: Performed at [facility], [place], [place] [address]  Acetaminophen level     Status: Abnormal  Collection Time: 08/16/18 11:13 AM  Result Value Ref Range   Acetaminophen (Tylenol), Serum <10 (L) 10 - 30 ug/mL    Comment: (NOTE) Therapeutic concentrations vary significantly. A range of 10-30 ug/mL  may be an effective concentration for many patients. However, some  are best treated at concentrations outside of this range. Acetaminophen concentrations >150 ug/mL at 4 hours after ingestion  and >50 ug/mL at 12 hours after ingestion are often associated with  toxic reactions. Performed at [facility], [address] [place], [place] [address]   cbc     Status: Abnormal   Collection Time: 08/16/18 11:13 AM  Result Value Ref Range   WBC 11.4 (H) 4.0 - 10.5 K/uL   RBC 5.31 4.22 - 5.81 MIL/uL   Hemoglobin 15.9 13.0 - 17.0 g/dL   HCT 47.1 39.0 - 52.0 %   MCV 88.7 80.0 - 100.0 fL   MCH 29.9 26.0 - 34.0 pg   MCHC 33.8 30.0 - 36.0 g/dL   RDW 12.1 11.5 - 15.5 %   Platelets 276 150 - 400 K/uL   nRBC 0.0 0.0 - 0.2 %    Comment: Performed at [facility], [address] [place], [place] [address]    No current facility-administered medications for this encounter.    Current Outpatient Medications  Medication Sig Dispense Refill  . acetaminophen (TYLENOL) 650 MG CR tablet Take 650 mg by mouth every 4 (four) hours as needed for pain.    Marland Kitchen albuterol (PROVENTIL HFA;VENTOLIN HFA) 108 (90 Base) MCG/ACT inhaler Inhale 1 puff into the lungs every 6 (six) hours as needed for wheezing or shortness of breath.    Marland Kitchen amLODipine (NORVASC) 10 MG tablet Take 1 tablet (10 mg total) by mouth daily. 30 tablet 11  . diphenhydrAMINE (BENADRYL) 25 MG tablet Take 1 tablet (25 mg total) by mouth every 6 (six) hours as needed. 30 tablet 0  . divalproex (DEPAKOTE ER) 250 MG 24 hr  tablet Take 250 mg by mouth daily.    Marland Kitchen LORazepam (ATIVAN) 1 MG tablet Take 1 mg by mouth every 6 (six) hours as needed for anxiety.    . metoprolol (LOPRESSOR) 50 MG tablet Take 50 mg by mouth 2 (two) times daily.    . predniSONE (STERAPRED UNI-PAK 21 TAB) 10 MG (21) TBPK tablet Dispense steroid taper pack as directed 21 tablet 0  . QUEtiapine (SEROQUEL XR) 400 MG 24 hr tablet Take 400 mg by mouth at bedtime.    Marland Kitchen QUEtiapine (SEROQUEL) 25 MG tablet Take 25 mg by mouth at bedtime.    . sodium phosphate (FLEET) 7-19 GM/118ML ENEM Place 133 mLs (1 enema total) rectally once. On morning of prostate biopsy (Patient not taking: Reported on 06/21/2015) 1 enema 0  . sulfamethoxazole-trimethoprim (BACTRIM DS,SEPTRA DS) 800-160 MG tablet Take 1 tablet by mouth 2 (two) times daily. X 3 days. Begin day prior to prostate biopsy 6 tablet 0  . traZODone (DESYREL) 50 MG tablet Take 50 mg by mouth at bedtime.    . trihexyphenidyl (ARTANE) 5 MG tablet Take 5 mg by mouth 3 (three) times daily with meals.    Marland Kitchen umeclidinium-vilanterol (ANORO ELLIPTA) 62.5-25 MCG/INH AEPB Inhale 1 puff into the lungs daily.      Musculoskeletal: Strength & Muscle Tone: decreased Gait & Station: Remained in bed during evaluation Patient leans: N/A  Psychiatric Specialty Exam: Physical Exam  Nursing note and vitals reviewed. Constitutional: He is oriented to person, place, and time. He appears  well-developed and well-nourished.  Respiratory: Effort normal.  Musculoskeletal: Normal range of motion.  Neurological: He is alert and oriented to person, place, and time.    Review of Systems  Constitutional: Negative.   HENT: Negative.   Eyes: Negative.   Respiratory: Negative.   Cardiovascular: Negative.   Gastrointestinal: Negative.   Genitourinary: Negative.   Musculoskeletal: Negative.   Skin: Negative.   Neurological: Negative.   Endo/Heme/Allergies: Negative.   Psychiatric/Behavioral: Positive for depression and  hallucinations.    Blood pressure (!) 115/97, pulse (!) 148, temperature 98 F (36.7 C), temperature source Oral, resp. rate 16, SpO2 94 %.There is no height or weight on file to calculate BMI.  General Appearance: Disheveled  Eye Contact:  Minimal  Speech:  Garbled and Slow  Volume:  Decreased  Mood:  Depressed  Affect:  Flat  Thought Process:  Linear and Descriptions of Associations: Circumstantial  Orientation:  Other:  person and place  Thought Content:  Hallucinations: Auditory  Suicidal Thoughts:  No  Homicidal Thoughts:  No  Memory:  Immediate;   Fair Recent;   Fair Remote;   Fair  Judgement:  Impaired  Insight:  Lacking  Psychomotor Activity:  Normal  Concentration:  Concentration: Fair  Recall:  Good  Fund of Knowledge:  Fair  Language:  Poor  Akathisia:  No  Handed:    AIMS (if indicated):     Assets:  Financial Resources/Insurance Housing Resilience  ADL's:  Unknown  Cognition:  Impaired,  Mild  Sleep:        Treatment Plan Summary: Daily contact with patient to assess and evaluate symptoms and progress in treatment and Medication management  Re-order home medications  Disposition: Recommend geri-psychiatric Inpatient admission when medically cleared.  [place], FNP 08/16/2018 12:26 PM

## 2018-08-16 NOTE — ED Notes (Signed)
Pt was "dressed out" while in triage. Items removed and bagged include one pair of socks, one pair of underwear, one tshirt, one pair of jeans, one belt, one pack of cigs with lighter, some loose change, $56 in cash. Pt was moved to [facility].

## 2018-08-17 DIAGNOSIS — F209 Schizophrenia, unspecified: Secondary | ICD-10-CM

## 2018-08-17 MED ORDER — LORAZEPAM 1 MG PO TABS: 1.0000 mg | ORAL_TABLET | Freq: Four times a day (QID) | ORAL | 0 refills | Status: DC | PRN

## 2018-08-17 MED ORDER — DIVALPROEX SODIUM ER 250 MG PO TB24: 250.0000 mg | ORAL_TABLET | Freq: Two times a day (BID) | ORAL | Status: DC

## 2018-08-17 MED ORDER — QUETIAPINE FUMARATE 25 MG PO TABS: 12.5000 mg | ORAL_TABLET | Freq: Two times a day (BID) | ORAL | 1 refills | Status: DC

## 2018-08-17 MED ORDER — DIVALPROEX SODIUM ER 250 MG PO TB24: 250.0000 mg | ORAL_TABLET | Freq: Two times a day (BID) | ORAL | 1 refills | Status: DC

## 2018-08-17 NOTE — ED Notes (Signed)
Pt currently in bed muttering to self and staring at ceiling

## 2018-08-17 NOTE — Consult Note (Signed)
[facility] Psych ED Discharge  08/17/2018 12:50 PM Carlos Nelson  MRN:  [id number] Principal Problem: Schizophrenia, chronic with acute exacerbation [facility]) Discharge Diagnoses: Principal Problem:   Schizophrenia, chronic with acute exacerbation ([place])  Subjective:  "I'm good".  Patient denies pain and hallucinations.  Calm and cooperative with no agitation.    Patient is seen by this provider face-to-face. Patient cooperative during evaluation, speech remains difficult to understand. Patient appearing stiff upon evaluation, asked if he needed help re-adjusting, he reported that he was fine and comfortable. Patient denies any suicidal or homicidal ideation at the time of this assessment. Patient denies any visual or auditory hallucinations at the time of this assessment. Medications recently adjusted, improvement followed adjustment. Plan to d/c home to group home, psychiatrically cleared.   Per TTS Notes Carlos Nelson is a 68 y.o. male patient reports that he came to the hospital because he is having some hallucinations.  He reports that he has had hallucinations in the past but it was in the 1970s.  Patient states "I have lost my mind.  I lost my mind back in the 70s to."  Patient reports that he has not slept in approximately 4 weeks but when he does sleep that is usually 4 at the max about 30 minutes.  He reports that he has been living at [place] [facility] for the last 11 years.  Patient reports that he does not like dogs because dogs do not like him.  Patient feels that he needs to be admitted to the hospital due to his hallucinations and reports that he has not had any in a while and this scares him.  HPI:  Per Dr. Joni Fears: 68 y.o.malewith a history of COPD hypertension and schizophrenia who is brought to the ED under involuntary commitment. He reports last night "I think Iwent crazy, I tore the house up."Reports that he does not drink. Last cocaine use was 8 months ago. He has  been using marijuana daily for the past 4 months. Denies any pain or other symptoms. He endorses disorganized thoughts and auditory hallucinations.  08/16/18: Patient is seen by this provider face-to-face.  Patient is pleasant and cooperative during evaluation.  Patient makes minimal eye contact and his speech is very difficult to understand and he has to be asked repeatedly to repeat the same answers.  Due to patient's risk factors with age and reporting sudden onset of auditory hallucinations that are scaring him and is concerning to the patient feel that patient meets inpatient criteria.  Total Time spent with patient: 30 minutes  Past Psychiatric History: Schizophrenia  Past Medical History:  Past Medical History:  Diagnosis Date  . COPD (chronic obstructive pulmonary disease) ([place])   . Hypertension   . Schizophrenia [facility])     Past Surgical History:  Procedure Laterality Date  . COLONOSCOPY    . COLONOSCOPY WITH PROPOFOL N/A 12/13/2016   Procedure: COLONOSCOPY WITH PROPOFOL;  Surgeon: Lollie Sails, MD;  Location: [facility] ENDOSCOPY;  Service: Endoscopy;  Laterality: N/A;  . ESOPHAGOGASTRODUODENOSCOPY (EGD) WITH PROPOFOL N/A 12/13/2016   Procedure: ESOPHAGOGASTRODUODENOSCOPY (EGD) WITH PROPOFOL;  Surgeon: Lollie Sails, MD;  Location: [facility] ENDOSCOPY;  Service: Endoscopy;  Laterality: N/A;  . left arm surgery     Family History:  Family History  Problem Relation Age of Onset  . Prostate cancer Neg Hx   . Bladder Cancer Neg Hx    Family Psychiatric  History: none  Social History:  Social History   Substance  and Sexual Activity  Alcohol Use Yes  . Alcohol/week: 0.0 standard drinks   Comment: beer     Social History   Substance and Sexual Activity  Drug Use Yes   Comment: marjuana    Social History   Socioeconomic History  . Marital status: Single    Spouse name: Not on file  . Number of children: Not on file  . Years of education: Not on file  . Highest  education level: Not on file  Occupational History  . Not on file  Social Needs  . Financial resource strain: Not on file  . Food insecurity    Worry: Not on file    Inability: Not on file  . Transportation needs    Medical: Not on file    Non-medical: Not on file  Tobacco Use  . Smoking status: Current Every Day Smoker    Packs/day: 1.00    Types: Cigarettes  . Smokeless tobacco: Never Used  Substance and Sexual Activity  . Alcohol use: Yes    Alcohol/week: 0.0 standard drinks    Comment: beer  . Drug use: Yes    Comment: marjuana  . Sexual activity: Not on file  Lifestyle  . Physical activity    Days per week: Not on file    Minutes per session: Not on file  . Stress: Not on file  Relationships  . Social Herbalist on phone: Not on file    Gets together: Not on file    Attends religious service: Not on file    Active member of club or organization: Not on file    Attends meetings of clubs or organizations: Not on file    Relationship status: Not on file  Other Topics Concern  . Not on file  Social History Narrative  . Not on file    Has this patient used any form of tobacco in the last 30 days? (Cigarettes, Smokeless Tobacco, Cigars, and/or Pipes) NA  Current Medications: Current Facility-Administered Medications  Medication Dose Route Frequency Provider Last Rate Last Dose  . acetaminophen (TYLENOL) tablet 650 mg  650 mg Oral Q4H PRN Carrie Mew, MD      . albuterol (PROVENTIL) (2.5 MG/3ML) 0.083% nebulizer solution 2.5 mg  2.5 mg Inhalation Q6H PRN Carrie Mew, MD      . amLODipine (NORVASC) tablet 10 mg  10 mg Oral Daily Carrie Mew, MD   10 mg at 08/17/18 1004  . divalproex (DEPAKOTE ER) 24 hr tablet 250 mg  250 mg Oral BID Patrecia Pour, NP      . LORazepam (ATIVAN) tablet 1 mg  1 mg Oral Q6H PRN Carrie Mew, MD      . metoprolol tartrate (LOPRESSOR) tablet 50 mg  50 mg Oral BID Carrie Mew, MD   50 mg at 08/17/18  1003  . QUEtiapine (SEROQUEL XR) 24 hr tablet 400 mg  400 mg Oral QHS Carrie Mew, MD      . QUEtiapine (SEROQUEL) tablet 25 mg  25 mg Oral QHS Carrie Mew, MD      . traZODone (DESYREL) tablet 50 mg  50 mg Oral QHS Carrie Mew, MD      . trihexyphenidyl (ARTANE) tablet 5 mg  5 mg Oral TID WC Carrie Mew, MD   5 mg at 08/17/18 1004  . umeclidinium-vilanterol (ANORO ELLIPTA) 62.5-25 MCG/INH 1 puff  1 puff Inhalation Daily Carrie Mew, MD   1 puff at 08/17/18 1007   Current Outpatient  Medications  Medication Sig Dispense Refill  . acetaminophen (TYLENOL) 650 MG CR tablet Take 650 mg by mouth every 4 (four) hours as needed for pain.    Marland Kitchen albuterol (PROVENTIL HFA;VENTOLIN HFA) 108 (90 Base) MCG/ACT inhaler Inhale 1 puff into the lungs every 6 (six) hours as needed for wheezing or shortness of breath.    Marland Kitchen amLODipine (NORVASC) 10 MG tablet Take 1 tablet (10 mg total) by mouth daily. (Patient not taking: Reported on 08/17/2018) 30 tablet 11  . diphenhydrAMINE (BENADRYL) 25 MG tablet Take 1 tablet (25 mg total) by mouth every 6 (six) hours as needed. (Patient not taking: Reported on 08/17/2018) 30 tablet 0  . divalproex (DEPAKOTE ER) 250 MG 24 hr tablet Take 250 mg by mouth daily.    Marland Kitchen LORazepam (ATIVAN) 1 MG tablet Take 1 mg by mouth every 6 (six) hours as needed for anxiety.    . metoprolol (LOPRESSOR) 50 MG tablet Take 50 mg by mouth 2 (two) times daily.    . predniSONE (STERAPRED UNI-PAK 21 TAB) 10 MG (21) TBPK tablet Dispense steroid taper pack as directed (Patient not taking: Reported on 08/17/2018) 21 tablet 0  . QUEtiapine (SEROQUEL XR) 400 MG 24 hr tablet Take 400 mg by mouth at bedtime.    Marland Kitchen QUEtiapine (SEROQUEL) 25 MG tablet Take 25 mg by mouth at bedtime.    . sodium phosphate (FLEET) 7-19 GM/118ML ENEM Place 133 mLs (1 enema total) rectally once. On morning of prostate biopsy (Patient not taking: Reported on 06/21/2015) 1 enema 0  . sulfamethoxazole-trimethoprim  (BACTRIM DS,SEPTRA DS) 800-160 MG tablet Take 1 tablet by mouth 2 (two) times daily. X 3 days. Begin day prior to prostate biopsy (Patient not taking: Reported on 08/16/2018) 6 tablet 0  . traZODone (DESYREL) 50 MG tablet Take 50 mg by mouth at bedtime.    . trihexyphenidyl (ARTANE) 5 MG tablet Take 5 mg by mouth 3 (three) times daily with meals.    Marland Kitchen umeclidinium-vilanterol (ANORO ELLIPTA) 62.5-25 MCG/INH AEPB Inhale 1 puff into the lungs daily.     PTA Medications: (Not in a hospital admission)   Musculoskeletal: Strength & Muscle Tone: within normal limits Gait & Station: normal, patient remained in bed during this assessment Patient leans: N/A  Psychiatric Specialty Exam: Physical Exam  Nursing note and vitals reviewed. Constitutional: He is oriented to person, place, and time. He appears well-developed and well-nourished.  HENT:  Head: Normocephalic.  Neck: Normal range of motion.  Respiratory: Effort normal.  Musculoskeletal: Normal range of motion.  Neurological: He is alert and oriented to person, place, and time.  Psychiatric: His behavior is normal. Thought content normal. His mood appears anxious. His affect is blunt. His speech is slurred. Cognition and memory are normal. He expresses impulsivity.    Review of Systems  Psychiatric/Behavioral: The patient is nervous/anxious.   All other systems reviewed and are negative.   Blood pressure (!) 156/106, pulse 91, temperature 98 F (36.7 C), temperature source Oral, resp. rate (!) 25, SpO2 93 %.There is no height or weight on file to calculate BMI.  General Appearance: Fairly Groomed  Eye Contact:  Good  Speech:  Slurred and missing teeth  Volume:  Normal  Mood:  Anxious, mild  Affect:  Blunt  Thought Process:  Coherent and Descriptions of Associations: Intact  Orientation:  Full (Time, Place, and Person)  Thought Content:  WDL and Logical  Suicidal Thoughts:  No  Homicidal Thoughts:  No  Memory:  Immediate;  Fair Recent;   Fair Remote;   Fair  Judgement:  Fair  Insight:  Fair  Psychomotor Activity:  Normal and slightly stiff posture in bed, pt reports "I feel fine"  Concentration:  Concentration: Good and Attention Span: Good  Recall:  [employer] of Knowledge:  appropriate for age and knowledge   Language:  difficult to understand speech otherwise appropriate   Akathisia:  No  Handed:  Right  AIMS (if indicated):     Assets:  Desire for Improvement Leisure Time Physical Health Resilience  ADL's:  At baseline  Cognition:  WNL  Sleep:      Demographic Factors:  Male and Age 65 or older  Loss Factors: NA  Historical Factors: NA  Risk Reduction Factors:   Positive social support and Positive therapeutic relationship  Continued Clinical Symptoms:  Schizophrenia:   Paranoid or undifferentiated type  Cognitive Features That Contribute To Risk:  None    Suicide Risk:  Minimal: No identifiable suicidal ideation.  Patients presenting with no risk factors but with morbid ruminations; may be classified as minimal risk based on the severity of the depressive symptoms   Plan Of Care/Follow-up recommendations:  Schizophrenia, chronic with acute exacerbation: -Increased Depakote 250 mg daily to BID -Continued Seroquel 400 mg at bedtime -Discontinued the additional 25 mg of Seroquel at bedtime -Started Seroquel 12.5 mg TID for stabilization of mood throughout the day  Insomnia: -Continued Trazodone 50 mg at bedtime   Activity:  as tolerated Diet:  heart healthy  Disposition: Plan for discharge home    Waylan Boga, NP 08/17/2018, 12:50 PM

## 2018-08-17 NOTE — ED Notes (Signed)
Called Carlos Nelson and told stated pt was ready to be discharged- she stated it would be 15 minutes until she got here

## 2018-08-17 NOTE — ED Notes (Signed)
Vett EDT at bedside to assist pt to bathroom

## 2018-08-17 NOTE — ED Notes (Signed)
TTS at bedside. 

## 2018-08-17 NOTE — ED Notes (Signed)
Pt given breakfast tray

## 2018-08-17 NOTE — ED Notes (Signed)
[phone] called this RN back- this RN spoke to [employer] about change in pt's plan of care and notified her that pt was ready for discharge- she states to call that number back when pt was ready

## 2018-08-17 NOTE — ED Notes (Signed)
Pt belongings returned- clothes, shoes, money, and a pack of cigarettes with a lighter

## 2018-08-17 NOTE — ED Notes (Signed)
Pt stated he is not hungry at this time- lunch tray placed at bedside- pt given more grape juice with ice

## 2018-08-17 NOTE — ED Notes (Signed)
Patient redirected to bed.

## 2018-08-17 NOTE — ED Notes (Signed)
Attempted to call number on sticky note attached to chart [phone]- [phone]- given a message that says the call could not be completed

## 2018-08-17 NOTE — Discharge Instructions (Signed)
Managing Schizophrenia If you have been diagnosed with schizophrenia, you may be relieved to know why you have felt or behaved a certain way. Still, you may have questions about the treatment ahead, how to get the support you need, and how to deal with the condition day-to-day. With care and support, you can learn to manage your symptoms and live with schizophrenia. How to manage lifestyle changes Managing stress Stress is your body's reaction to life changes and events, both good and bad. For people with schizophrenia, stress can sometimes cause an episode to start (can be a trigger), so it is important to learn ways to manage stress. Your health care provider, therapist, or counselor may suggest some techniques such as:  Meditation, muscle relaxation, and breathing exercises.  Music therapy. This can include creating music or listening to music.  Life skills training. This training is focused on work, self-care, money, house management, and social skills. Other things you can do to manage stress include:  Keeping a stress diary. This can help you learn what causes your stress to start and how you can control your response to those triggers.  Exercising. Even a short daily walk can help.  Getting enough sleep.  Making a schedule to manage your time. Knowing what you will do from day to day helps you avoid feeling overwhelmed by tasks and deadlines.  Spending time on hobbies you enjoy that help you relax.  Medicines Antipsychotic medicines are usually prescribed to help manage this condition. Make sure you:  Talk with your pharmacist or health care provider about all medicines that you take, the possible side effects, and which medicines are safe to take together.  Make it your goal to take part in all treatment decisions (shared decision-making). Ask about possible side effects of medicines that your health care provider recommends, and tell him or her how you feel about having those  side effects. It is best if shared decision-making with your health care provider is part of your total treatment plan. Relationships Having the support of your family and friends can play a major role in the success of your treatment. The following steps can help you maintain healthy relationships:  Think about going to couples therapy, family therapy, or family education classes.  Create a written plan for your treatment, and include close family members and friends in the process.  Consider bringing your partner or another family member or friend to the appointments you have with your health care provider. How to recognize changes in your condition If you find that your condition is getting worse, talk to your health care provider right away. Watch for these signs:  You hear, see, taste, and things that others do not (hallucinate).  You cannot set aside persistent, unusual thoughts or beliefs, no matter what others may believe.  Your speech becomes unclear.  You withdraw from friends and family members.  You have racing thoughts and have trouble thinking clearly or staying focused.  You have poor personal hygiene, weight gain or weight loss, or changes in how you are sleeping or eating. Follow these instructions at home:  Take over-the-counter and prescription medicines only as told by your health care provider. Do not start new medicines or stop taking medicines before you ask your health care provider if it is safe to make those changes.  Avoid caffeine, alcohol, and drugs. They can affect how your medicine works and can make your symptoms worse.  Eat a healthy diet.  Look for support groups  in your area so you can meet other people with your condition. You will learn different methods that others have used to manage schizophrenia.  Keep all follow-up visits as told by your health care provider, therapist, or counselor. This is important. Where to find support Talking to  others  Reach out to trusted friends or family members, explain your condition, and let them know that you are working with a health care team.  Consider giving educational materials to friends and family.  If you are having trouble telling your friends and family about your condition, keep in mind that honest and open communication can make these conversations easier. Finances Be sure to check with your insurance carrier to find out what treatment options are covered by your plan. You may also be able to find financial assistance through not-for-profit organizations or with local government-based resources. If you are taking medicines, you may be able to get the generic form, which may be less expensive than brand-name medicine. Some makers of prescription medicines also offer help to patients who cannot afford the medicines that they need. Therapy and support groups  Make sure you find a counselor or therapist who is familiar with schizophrenia. Meet with your counselor or therapist once a week or more often if needed.  Find support programs for people with schizophrenia. Where to find more information [employer] on Mental Illness: www.nami.org Contact a health care provider if:  You are not able to take your medicines as prescribed.  Your symptoms get worse. Get help right away if:  You have serious thoughts about hurting yourself or others. If you ever feel like you may hurt yourself or others, or have thoughts about taking your own life, get help right away. You can go to your nearest emergency department or call:  Your local emergency services (911 in the [place]  A suicide crisis helpline, such as the [facility] at (856)042-2473. This is open 24 hours a day. Summary  Schizophrenia is a lifelong illness. It is best controlled with continuous treatment that includes medicine and therapy.  Learning ways to deal with stress may help your treatment  work better.  Having the support of your family and friends can help to make your treatment a success.  If you find that your condition is getting worse, talk to your health care provider right away. This information is not intended to replace advice given to you by your health care provider. Make sure you discuss any questions you have with your health care provider. Document Released: 05/03/2016 Document Revised: 04/25/2018 Document Reviewed: 05/03/2016 Elsevier Patient Education  2020 [employer].   Follow up with outpatient provider

## 2018-08-17 NOTE — ED Notes (Signed)
Attempted to call Carlos Nelson back at number provided x2- message states that call cannot be completed at this time

## 2018-08-17 NOTE — ED Notes (Addendum)
Attempted to call group home at number in chart- [phone]- with no answer x2

## 2018-08-17 NOTE — ED Provider Notes (Signed)
-----------------------------------------   6:02 AM on 08/17/2018 -----------------------------------------   Blood pressure (!) 147/99, pulse (!) 111, temperature 98 F (36.7 C), temperature source Oral, resp. rate (!) 29, SpO2 100 %.  The patient had no acute events since last update.  Calm and cooperative at this time.  Disposition is pending per Psychiatry/Behavioral Medicine team recommendations.     Alfred Levins, [place], MD 08/17/18 [phone]

## 2018-08-17 NOTE — BH Assessment (Addendum)
Referral information for Psychiatric Hospitalization faxed to;   [facility] [phone]),   . [facility] [phone] -or- [phone]), wait list  . [facility] ([phone]---[phone]),  . [facility] [phone], [phone], [phone] or [phone]),   . [facility] ([phone] or [phone]), wait list  . [facility] [phone] or [phone]),   . [facility] [phone]).

## 2018-08-20 ENCOUNTER — Observation Stay
Admission: EM | Admit: 2018-08-20 | Discharge: 2018-08-22 | Disposition: A | Discharge status: Home / Self Care | Payer: Medicare Other | Attending: Internal Medicine | Admitting: Internal Medicine

## 2018-08-20 ENCOUNTER — Other Ambulatory Visit: Payer: Self-pay

## 2018-08-20 ENCOUNTER — Emergency Department: Payer: Medicare Other

## 2018-08-20 DIAGNOSIS — R079 Chest pain, unspecified: Principal | ICD-10-CM | POA: Insufficient documentation

## 2018-08-20 DIAGNOSIS — R7989 Other specified abnormal findings of blood chemistry: Secondary | ICD-10-CM | POA: Diagnosis not present

## 2018-08-20 DIAGNOSIS — Z87891 Personal history of nicotine dependence: Secondary | ICD-10-CM | POA: Insufficient documentation

## 2018-08-20 DIAGNOSIS — R4689 Other symptoms and signs involving appearance and behavior: Secondary | ICD-10-CM | POA: Diagnosis present

## 2018-08-20 DIAGNOSIS — J449 Chronic obstructive pulmonary disease, unspecified: Secondary | ICD-10-CM | POA: Insufficient documentation

## 2018-08-20 DIAGNOSIS — Z79899 Other long term (current) drug therapy: Secondary | ICD-10-CM | POA: Insufficient documentation

## 2018-08-20 DIAGNOSIS — Z888 Allergy status to other drugs, medicaments and biological substances status: Secondary | ICD-10-CM | POA: Insufficient documentation

## 2018-08-20 DIAGNOSIS — F209 Schizophrenia, unspecified: Secondary | ICD-10-CM | POA: Diagnosis not present

## 2018-08-20 DIAGNOSIS — N183 Chronic kidney disease, stage 3 (moderate): Secondary | ICD-10-CM | POA: Insufficient documentation

## 2018-08-20 DIAGNOSIS — G4700 Insomnia, unspecified: Secondary | ICD-10-CM | POA: Diagnosis not present

## 2018-08-20 DIAGNOSIS — E876 Hypokalemia: Secondary | ICD-10-CM | POA: Diagnosis present

## 2018-08-20 DIAGNOSIS — R000 Tachycardia, unspecified: Secondary | ICD-10-CM | POA: Diagnosis present

## 2018-08-20 DIAGNOSIS — I129 Hypertensive chronic kidney disease with stage 1 through stage 4 chronic kidney disease, or unspecified chronic kidney disease: Secondary | ICD-10-CM | POA: Insufficient documentation

## 2018-08-20 LAB — CBC WITH DIFFERENTIAL/PLATELET
Abs Immature Granulocytes: 0.06 10*3/uL (ref 0.00–0.07)
Basophils Absolute: 0 10*3/uL (ref 0.0–0.1)
Basophils Relative: 0 %
Eosinophils Absolute: 0.1 10*3/uL (ref 0.0–0.5)
Eosinophils Relative: 0 %
HCT: 47.7 % (ref 39.0–52.0)
Hemoglobin: 16.2 g/dL (ref 13.0–17.0)
Immature Granulocytes: 0 %
Lymphocytes Relative: 21 %
Lymphs Abs: 3 10*3/uL (ref 0.7–4.0)
MCH: 29.8 pg (ref 26.0–34.0)
MCHC: 34 g/dL (ref 30.0–36.0)
MCV: 87.8 fL (ref 80.0–100.0)
Monocytes Absolute: 1.4 10*3/uL — ABNORMAL HIGH (ref 0.1–1.0)
Monocytes Relative: 10 %
Neutro Abs: 9.4 10*3/uL — ABNORMAL HIGH (ref 1.7–7.7)
Neutrophils Relative %: 69 %
Platelets: 269 10*3/uL (ref 150–400)
RBC: 5.43 MIL/uL (ref 4.22–5.81)
RDW: 11.9 % (ref 11.5–15.5)
WBC: 13.9 10*3/uL — ABNORMAL HIGH (ref 4.0–10.5)
nRBC: 0 % (ref 0.0–0.2)

## 2018-08-20 LAB — BASIC METABOLIC PANEL
Anion gap: 14 (ref 5–15)
BUN: 14 mg/dL (ref 8–23)
CO2: 30 mmol/L (ref 22–32)
Calcium: 10.6 mg/dL — ABNORMAL HIGH (ref 8.9–10.3)
Chloride: 93 mmol/L — ABNORMAL LOW (ref 98–111)
Creatinine, Ser: 1.25 mg/dL — ABNORMAL HIGH (ref 0.61–1.24)
GFR calc Af Amer: >60 mL/min (ref >60)
GFR calc non Af Amer: 58 mL/min — ABNORMAL LOW (ref >60)
Glucose, Bld: 113 mg/dL — ABNORMAL HIGH (ref 70–99)
Potassium: 3 mmol/L — ABNORMAL LOW (ref 3.5–5.1)
Sodium: 137 mmol/L (ref 135–145)

## 2018-08-20 LAB — TROPONIN I (HIGH SENSITIVITY): Troponin I (High Sensitivity): 24 ng/L — ABNORMAL HIGH (ref <18)

## 2018-08-20 LAB — VALPROIC ACID LEVEL: Valproic Acid Lvl: <10 ug/mL — ABNORMAL LOW (ref 50.0–100.0)

## 2018-08-20 MED ORDER — METOPROLOL TARTRATE 50 MG PO TABS
50.0000 mg | ORAL_TABLET | Freq: Two times a day (BID) | ORAL | Status: DC
  Administered 2018-08-20 – 2018-08-22 (×4): 50 mg via ORAL
  Filled 2018-08-20 (×4): qty 1

## 2018-08-20 MED ORDER — LACTATED RINGERS IV BOLUS
1000.0000 mL | Freq: Once | INTRAVENOUS | Status: AC
  Administered 2018-08-20: 22:00:00 1000 mL via INTRAVENOUS

## 2018-08-20 MED ORDER — QUETIAPINE FUMARATE ER 200 MG PO TB24
400.0000 mg | ORAL_TABLET | Freq: Every day | ORAL | Status: DC
  Administered 2018-08-20 – 2018-08-21 (×2): 400 mg via ORAL
  Filled 2018-08-20 (×3): qty 2

## 2018-08-20 MED ORDER — POTASSIUM CHLORIDE CRYS ER 20 MEQ PO TBCR
40.0000 meq | EXTENDED_RELEASE_TABLET | Freq: Once | ORAL | Status: AC
  Administered 2018-08-20: 40 meq via ORAL
  Filled 2018-08-20 (×2): qty 2

## 2018-08-20 MED ORDER — DIVALPROEX SODIUM ER 250 MG PO TB24
250.0000 mg | ORAL_TABLET | Freq: Two times a day (BID) | ORAL | Status: DC
  Administered 2018-08-21 – 2018-08-22 (×3): 250 mg via ORAL
  Filled 2018-08-20 (×4): qty 1

## 2018-08-20 MED ORDER — TRAZODONE HCL 50 MG PO TABS
50.0000 mg | ORAL_TABLET | Freq: Every day | ORAL | Status: DC
  Administered 2018-08-20 – 2018-08-21 (×2): 50 mg via ORAL
  Filled 2018-08-20 (×2): qty 1

## 2018-08-20 NOTE — ED Triage Notes (Signed)
Pt arrives to ED from [facility] via [facility] with c/c of chest pain and medication non compliance. Pt with Hx of schizophrenia. EMS reports Sinus tachycardia during transport. Upon arrival pt A&Ox4, NAD, no respiratory Sx evident.

## 2018-08-20 NOTE — ED Notes (Signed)
Pt took lopressor and LR bolus but refused to take potassium tablets stating "oh hell no man, I need something to make me sleep. I'm not taking that shit." This nurse explained that the potassium was to correct his low K level and that is has a muscle relaxing effect but the patient persisted in his refusal and demanded something to help him sleep. The patient then started talking about how he thought he might have an ear infection. EDP notified.

## 2018-08-20 NOTE — ED Provider Notes (Signed)
[facility] Emergency Department Provider Note   ____________________________________________   First MD Initiated Contact with Patient 08/20/18 1942     (approximate)  I have reviewed the triage vital signs and the nursing notes.   HISTORY  Chief Complaint Chest Pain    HPI Carlos Nelson is a 68 y.o. male with history of schizophrenia, hypertension, COPD who presents to the ED complaining of chest pain.  Patient reports he has burning discomfort along the right side of his chest and upper abdomen which is been constant over the past couple of days.  He states "it feels like I am bleeding in my stomach".  He denies any blood in his stool or any dark tarry stool, has not had any hematemesis.  History is limited as patient frequently speaks incoherently and difficult to redirect at times.  Spoke with patient's caretaker over the phone, who states that patient has been very erratic since stopping taking his medications about 1 week ago.  He was seen in the ED for similar complaints 3 days ago, evaluated by psychiatry and determined to be appropriate for discharge home.  She states that he agreed to take his medicines while in the hospital, but has again stopped taking them since he is returned home.  Earlier this evening, he was threatening towards other residents at the group home and caretaker reports concern for their safety.        Past Medical History:  Diagnosis Date  . COPD (chronic obstructive pulmonary disease) ([place])   . Hypertension   . Schizophrenia [facility])     Patient Active Problem List   Diagnosis Date Noted  . Schizophrenia, chronic with acute exacerbation ([place]) 08/17/2018  . Elevated PSA 05/24/2015    Past Surgical History:  Procedure Laterality Date  . COLONOSCOPY    . COLONOSCOPY WITH PROPOFOL N/A 12/13/2016   Procedure: COLONOSCOPY WITH PROPOFOL;  Surgeon: Lollie Sails, MD;  Location: [facility] ENDOSCOPY;  Service: Endoscopy;   Laterality: N/A;  . ESOPHAGOGASTRODUODENOSCOPY (EGD) WITH PROPOFOL N/A 12/13/2016   Procedure: ESOPHAGOGASTRODUODENOSCOPY (EGD) WITH PROPOFOL;  Surgeon: Lollie Sails, MD;  Location: [facility] ENDOSCOPY;  Service: Endoscopy;  Laterality: N/A;  . left arm surgery      Prior to Admission medications   Medication Sig Start Date End Date Taking? Authorizing Provider  albuterol (PROVENTIL HFA;VENTOLIN HFA) 108 (90 Base) MCG/ACT inhaler Inhale 1 puff into the lungs every 6 (six) hours as needed for wheezing or shortness of breath.    [provider]  divalproex (DEPAKOTE ER) 250 MG 24 hr tablet Take 1 tablet (250 mg total) by mouth 2 (two) times daily. 08/17/18   Patrecia Pour, NP  LORazepam (ATIVAN) 1 MG tablet Take 1 tablet (1 mg total) by mouth every 6 (six) hours as needed for anxiety. 08/17/18   Patrecia Pour, NP  metoprolol (LOPRESSOR) 50 MG tablet Take 50 mg by mouth 2 (two) times daily.    [provider]  QUEtiapine (SEROQUEL XR) 400 MG 24 hr tablet Take 400 mg by mouth at bedtime.    [provider]  QUEtiapine (SEROQUEL) 25 MG tablet Take 0.5 tablets (12.5 mg total) by mouth 2 (two) times daily. 08/17/18   Patrecia Pour, NP  traZODone (DESYREL) 50 MG tablet Take 50 mg by mouth at bedtime.    [provider]  trihexyphenidyl (ARTANE) 5 MG tablet Take 5 mg by mouth 3 (three) times daily with meals.    [provider]  umeclidinium-vilanterol (ANORO ELLIPTA) 62.5-25 MCG/INH AEPB Inhale 1 puff into the lungs daily.    [provider]    Allergies Ace inhibitors  Family History  Problem Relation Age of Onset  . Prostate cancer Neg Hx   . Bladder Cancer Neg Hx     Social History Social History   Tobacco Use  . Smoking status: Former Smoker    Packs/day: 1.00    Types: Cigarettes  . Smokeless tobacco: Never Used  Substance Use Topics  . Alcohol use: Yes    Alcohol/week: 0.0 standard drinks    Comment: beer  . Drug use: Yes     Types: Marijuana    Comment: marjuana    Review of Systems  Constitutional: No fever/chills Eyes: No visual changes. ENT: No sore throat. Cardiovascular: Positive for chest pain. Respiratory: Denies shortness of breath. Gastrointestinal: Positive for abdominal pain.  No nausea, no vomiting.  No diarrhea.  No constipation. Genitourinary: Negative for dysuria. Musculoskeletal: Negative for back pain. Skin: Negative for rash. Neurological: Negative for headaches, focal weakness or numbness.  ____________________________________________   PHYSICAL EXAM:  VITAL SIGNS: ED Triage Vitals  Enc Vitals Group     BP      Pulse      Resp      Temp      Temp src      SpO2      Weight      Height      Head Circumference      Peak Flow      Pain Score      Pain Loc      Pain Edu?      Excl. in [place]?     Constitutional: Alert and oriented. Eyes: Conjunctivae are normal. Head: Atraumatic. Nose: No congestion/rhinnorhea. Mouth/Throat: Mucous membranes are moist. Neck: Normal ROM Cardiovascular: Tachycardic, regular rhythm. Grossly normal heart sounds. Respiratory: Normal respiratory effort.  No retractions. Lungs CTAB. Gastrointestinal: Soft and nontender. No distention. Genitourinary: deferred Musculoskeletal: No lower extremity tenderness nor edema. Neurologic:  Normal speech and language. No gross focal neurologic deficits are appreciated. Skin:  Skin is warm, dry and intact. No rash noted. Psychiatric: Pressured and mumbling speech, erratic behavior and difficult to redirect.  ____________________________________________   LABS (all labs ordered are listed, but only abnormal results are displayed)  Labs Reviewed  CBC WITH DIFFERENTIAL/PLATELET - Abnormal; Notable for the following components:      Result Value   WBC 13.9 (*)    Neutro Abs 9.4 (*)    Monocytes Absolute 1.4 (*)    All other components within normal limits  BASIC METABOLIC PANEL - Abnormal; Notable  for the following components:   Potassium 3.0 (*)    Chloride 93 (*)    Glucose, Bld 113 (*)    Creatinine, Ser 1.25 (*)    Calcium 10.6 (*)    GFR calc non Af Amer 58 (*)    All other components within normal limits  VALPROIC ACID LEVEL - Abnormal; Notable for the following components:   Valproic Acid Lvl <10 (*)    All other components within normal limits  TROPONIN I (HIGH SENSITIVITY) - Abnormal; Notable for the following components:   Troponin I (High Sensitivity) 24 (*)    All other components within normal limits  TROPONIN I (HIGH SENSITIVITY)   ____________________________________________  EKG  ED ECG REPORT I, Blake Divine, the attending physician, personally viewed and interpreted this ECG.   Date: 08/20/2018  EKG Time: 19:41  Rate: 134  Rhythm: sinus tachycardia, right axis deviation  Axis: right axis deviation  Intervals:none  ST&T Change: None   PROCEDURES  Procedure(s) performed (including Critical Care):  Procedures   ____________________________________________   INITIAL IMPRESSION / ASSESSMENT AND PLAN / ED COURSE       68 year old male with history of schizophrenia presenting to the ED complaining of burning pain in his chest and upper abdomen, also with disorganized speech and behavior.  Per caretaker at the group home, patient has been refusing to take his medicines, increasingly disorganized and at times aggressive.  EKG without ischemic changes and symptoms would be very atypical for ACS.  He does have a mildly elevated troponin, will trend but if this is stable do not suspect ACS.  Do not suspect PE, patient's tachycardia more likely related to his agitation and heart rate is similar to prior ED visit.  Patient refusing chest x-ray at this time, but relatively low suspicion for pneumonia given he denies any fevers or cough.  Remainder of labs significant for hypokalemia, will replete.  Patient's caretaker is requesting he be evaluated by  psychiatry given his disorganized and aggressive behavior.  Patient is agreeable to this and while he appears disorganized, is not an immediate threat to himself or others, will hold off on involuntary commission at this time.  Patient turned over to Dr. Jacqualine Code pending additional labs and psychiatry evaluation.      ____________________________________________   FINAL CLINICAL IMPRESSION(S) / ED DIAGNOSES  Final diagnoses:  Chest pain, unspecified type  Schizophrenia, unspecified type [facility])  Disorganized behavior     ED Discharge Orders    None       Note:  This document was prepared using Dragon voice recognition software and may include unintentional dictation errors.   Blake Divine, MD 08/20/18 2130

## 2018-08-20 NOTE — Consult Note (Signed)
[facility] Face-to-Face Psychiatry Consult   Reason for Consult: Chest pain Referring Physician: Dr. Charna Archer Patient Identification: Carlos Nelson MRN:  [id number] Principal Diagnosis: <principal problem not specified> Diagnosis: Schizophrenia  Total Time spent with patient: 45 minutes  Subjective: "I ain't want to hurt nobody." Carlos Nelson is a 68 y.o. male patient presented to [facility] ED via EMS voluntarily.  The patient was seen face-to-face by this provider; chart reviewed and consulted with Dr. Jacqualine Code on 08/20/2018 due to the care of the patient. It was discussed with the EDP that the patient does not meet criteria to be admitted to the inpatient unit.   On evaluation the patient is alert and oriented x3, calm and cooperative, and mood-congruent with affect. The patient does not appear to be responding to internal or external stimuli. Neither is the patient presenting with any delusional thinking. The patient denies auditory or visual hallucinations. The patient denies any suicidal, homicidal, or self-harm ideations. The patient is not presenting with any psychotic or paranoid behaviors. During an encounter with the patient, he was able to answer some questions appropriately. Collateral was obtained from Cleora Fleet at [place]. who expresses concerns for patient's medications noncompliance. She discussed the patient be placed on injectables which will make caring for him better. This provider discussed with her to discussed that with his provider. Plan: The patient is not a safety risk to self or others and does not require psychiatric inpatient admission for stabilization and treatment.  HPI: Per Dr. Charna Archer; Carlos Nelson is a 67 y.o. male with history of schizophrenia, hypertension, COPD who presents to the ED complaining of chest pain.  Patient reports he has burning discomfort along the right side of his chest and upper abdomen which is been constant over  the past couple of days.  He states "it feels like I am bleeding in my stomach".  He denies any blood in his stool or any dark tarry stool, has not had any hematemesis.  History is limited as patient frequently speaks incoherently and difficult to redirect at times.  Spoke with patient's caretaker over the phone, who states that patient has been very erratic since stopping taking his medications about 1 week ago.  He was seen in the ED for similar complaints 3 days ago, evaluated by psychiatry and determined to be appropriate for discharge home.  She states that he agreed to take his medicines while in the hospital, but has again stopped taking them since he is returned home.  Earlier this evening, he was threatening towards other residents at the group home and caretaker reports concern for their safety. Past Psychiatric History:  Schizophrenia Risk to Self:  No Risk to Others:  No Prior Inpatient Therapy:  Yes Prior Outpatient Therapy:  Yes  Past Medical History:  Past Medical History:  Diagnosis Date  . COPD (chronic obstructive pulmonary disease) ([place])   . Hypertension   . Schizophrenia [facility])     Past Surgical History:  Procedure Laterality Date  . COLONOSCOPY    . COLONOSCOPY WITH PROPOFOL N/A 12/13/2016   Procedure: COLONOSCOPY WITH PROPOFOL;  Surgeon: Lollie Sails, MD;  Location: [facility] ENDOSCOPY;  Service: Endoscopy;  Laterality: N/A;  . ESOPHAGOGASTRODUODENOSCOPY (EGD) WITH PROPOFOL N/A 12/13/2016   Procedure: ESOPHAGOGASTRODUODENOSCOPY (EGD) WITH PROPOFOL;  Surgeon: Lollie Sails, MD;  Location: [facility] ENDOSCOPY;  Service: Endoscopy;  Laterality: N/A;  . left arm surgery     Family History:  Family History  Problem Relation Age of  Onset  . Prostate cancer Neg Hx   . Bladder Cancer Neg Hx    Family Psychiatric  History: History reviewed. No pertinent family psychiatric history Social History:  Social History   Substance and Sexual Activity  Alcohol Use Yes  .  Alcohol/week: 0.0 standard drinks   Comment: beer     Social History   Substance and Sexual Activity  Drug Use Yes  . Types: Marijuana   Comment: marjuana    Social History   Socioeconomic History  . Marital status: Single    Spouse name: Not on file  . Number of children: Not on file  . Years of education: Not on file  . Highest education level: Not on file  Occupational History  . Not on file  Social Needs  . Financial resource strain: Not on file  . Food insecurity    Worry: Not on file    Inability: Not on file  . Transportation needs    Medical: Not on file    Non-medical: Not on file  Tobacco Use  . Smoking status: Former Smoker    Packs/day: 1.00    Types: Cigarettes  . Smokeless tobacco: Never Used  Substance and Sexual Activity  . Alcohol use: Yes    Alcohol/week: 0.0 standard drinks    Comment: beer  . Drug use: Yes    Types: Marijuana    Comment: marjuana  . Sexual activity: Not on file  Lifestyle  . Physical activity    Days per week: Not on file    Minutes per session: Not on file  . Stress: Not on file  Relationships  . Social Herbalist on phone: Not on file    Gets together: Not on file    Attends religious service: Not on file    Active member of club or organization: Not on file    Attends meetings of clubs or organizations: Not on file    Relationship status: Not on file  Other Topics Concern  . Not on file  Social History Narrative  . Not on file   Additional Social History:    Allergies:   Allergies  Allergen Reactions  . Ace Inhibitors Swelling    Labs:  Results for orders placed or performed during the hospital encounter of 08/20/18 (from the past 48 hour(s))  CBC with Differential     Status: Abnormal   Collection Time: 08/20/18  8:13 PM  Result Value Ref Range   WBC 13.9 (H) 4.0 - 10.5 K/uL   RBC 5.43 4.22 - 5.81 MIL/uL   Hemoglobin 16.2 13.0 - 17.0 g/dL   HCT 47.7 39.0 - 52.0 %   MCV 87.8 80.0 - 100.0 fL    MCH 29.8 26.0 - 34.0 pg   MCHC 34.0 30.0 - 36.0 g/dL   RDW 11.9 11.5 - 15.5 %   Platelets 269 150 - 400 K/uL   nRBC 0.0 0.0 - 0.2 %   Neutrophils Relative % 69 %   Neutro Abs 9.4 (H) 1.7 - 7.7 K/uL   Lymphocytes Relative 21 %   Lymphs Abs 3.0 0.7 - 4.0 K/uL   Monocytes Relative 10 %   Monocytes Absolute 1.4 (H) 0.1 - 1.0 K/uL   Eosinophils Relative 0 %   Eosinophils Absolute 0.1 0.0 - 0.5 K/uL   Basophils Relative 0 %   Basophils Absolute 0.0 0.0 - 0.1 K/uL   Immature Granulocytes 0 %   Abs Immature Granulocytes 0.06 0.00 - 0.07  K/uL    Comment: Performed at [facility], [address] [place], [place] [address]  Basic metabolic panel     Status: Abnormal   Collection Time: 08/20/18  8:13 PM  Result Value Ref Range   Sodium 137 135 - 145 mmol/L   Potassium 3.0 (L) 3.5 - 5.1 mmol/L   Chloride 93 (L) 98 - 111 mmol/L   CO2 30 22 - 32 mmol/L   Glucose, Bld 113 (H) 70 - 99 mg/dL   BUN 14 8 - 23 mg/dL   Creatinine, Ser 1.25 (H) 0.61 - 1.24 mg/dL   Calcium 10.6 (H) 8.9 - 10.3 mg/dL   GFR calc non Af Amer 58 (L) >60 mL/min   GFR calc Af Amer >60 >60 mL/min   Anion gap 14 5 - 15    Comment: Performed at [facility], [place], [place] [address]  Troponin I (High Sensitivity)     Status: Abnormal   Collection Time: 08/20/18  8:13 PM  Result Value Ref Range   Troponin I (High Sensitivity) 24 (H) <18 ng/L    Comment: (NOTE) Elevated high sensitivity troponin I (hsTnI) values and significant  changes across serial measurements may suggest ACS but many other  chronic and acute conditions are known to elevate hsTnI results.  Refer to the "Links" section for chest pain algorithms and additional  guidance. Performed at [facility], [address] [place], [place] [address]   Valproic acid level     Status: Abnormal   Collection Time: 08/20/18  8:13 PM  Result Value Ref Range   Valproic Acid Lvl <10 (L) 50.0 - 100.0 ug/mL    Comment:  Performed at [facility], [address] [place], [place] [address]    Current Facility-Administered Medications  Medication Dose Route Frequency Provider Last Rate Last Dose  . [START ON 08/21/2018] divalproex (DEPAKOTE ER) 24 hr tablet 250 mg  250 mg Oral BID Blake Divine, MD      . metoprolol tartrate (LOPRESSOR) tablet 50 mg  50 mg Oral BID Blake Divine, MD   50 mg at 08/20/18 2149  . potassium chloride SA (K-DUR) CR tablet 40 mEq  40 mEq Oral Once Blake Divine, MD      . QUEtiapine (SEROQUEL XR) 24 hr tablet 400 mg  400 mg Oral QHS Delman Kitten, MD      . traZODone (DESYREL) tablet 50 mg  50 mg Oral Lenward Chancellor, MD   50 mg at 08/20/18 2329   Current Outpatient Medications  Medication Sig Dispense Refill  . albuterol (PROVENTIL HFA;VENTOLIN HFA) 108 (90 Base) MCG/ACT inhaler Inhale 1 puff into the lungs every 6 (six) hours as needed for wheezing or shortness of breath.    . chlorthalidone (HYGROTON) 25 MG tablet Take 12.5 mg by mouth daily.    . QUEtiapine (SEROQUEL XR) 400 MG 24 hr tablet Take 400 mg by mouth at bedtime.    . traZODone (DESYREL) 50 MG tablet Take 50 mg by mouth at bedtime.    . trihexyphenidyl (ARTANE) 5 MG tablet Take 5 mg by mouth 3 (three) times daily with meals.    Marland Kitchen umeclidinium-vilanterol (ANORO ELLIPTA) 62.5-25 MCG/INH AEPB Inhale 1 puff into the lungs daily.    . divalproex (DEPAKOTE ER) 250 MG 24 hr tablet Take 1 tablet (250 mg total) by mouth 2 (two) times daily. 60 tablet 1  . LORazepam (ATIVAN) 1 MG tablet Take 1 tablet (1 mg total) by  mouth every 6 (six) hours as needed for anxiety. 30 tablet 0  . QUEtiapine (SEROQUEL) 25 MG tablet Take 0.5 tablets (12.5 mg total) by mouth 2 (two) times daily. 60 tablet 1    Musculoskeletal: Strength & Muscle Tone: within normal limits Gait & Station: normal Patient leans: N/A  Psychiatric Specialty Exam: Physical Exam  Nursing note and vitals reviewed. Constitutional: He is oriented to person,  place, and time. He appears well-developed and well-nourished.  HENT:  Head: Normocephalic.  Eyes: Pupils are equal, round, and reactive to light. Conjunctivae are normal.  Neck: Normal range of motion. Neck supple.  Cardiovascular: Normal rate.  Respiratory: Effort normal.  Musculoskeletal: Normal range of motion.  Neurological: He is alert and oriented to person, place, and time.  Skin: Skin is warm and dry.    Review of Systems  Psychiatric/Behavioral: The patient is nervous/anxious.   All other systems reviewed and are negative.   Blood pressure (!) 128/91, pulse (!) 126, temperature 99.2 F (37.3 C), temperature source Oral, resp. rate (!) 32, height 5\' 4"  (1.626 m), weight 53.1 kg, SpO2 96 %.Body mass index is 20.08 kg/m.  General Appearance: Casual  Eye Contact:  Minimal  Speech:  Garbled and Pressured  Volume:  Increased  Mood:  Anxious  Affect:  Congruent  Thought Process:  Coherent  Orientation:  Full (Time, Place, and Person)  Thought Content:  Logical  Suicidal Thoughts:  No  Homicidal Thoughts:  No  Memory:  Immediate;   Good Recent;   Good Remote;   Good  Judgement:  Fair  Insight:  Fair  Psychomotor Activity:  Increased  Concentration:  Concentration: Good and Attention Span: Good  Recall:  Good  Fund of Knowledge:  Good  Language:  Good  Akathisia:  Negative  Handed:  Right  AIMS (if indicated):     Assets:  Desire for Improvement Financial Resources/Insurance Social Support  ADL's:  Intact  Cognition:  WNL  Sleep:   Not good     Treatment Plan Summary: Daily contact with patient to assess and evaluate symptoms and progress in treatment and Medication management  Disposition: No evidence of imminent risk to self or others at present.   Patient does not meet criteria for psychiatric inpatient admission. Supportive therapy provided about ongoing stressors. Discussed crisis plan, support from social network, calling 911, coming to the Emergency  Department, and calling Suicide Hotline.  Caroline Sauger, NP 08/20/2018 11:41 PM

## 2018-08-20 NOTE — ED Provider Notes (Signed)
-----------------------------------------   11:45 PM on 08/20/2018 -----------------------------------------  Assumed care of patient at shift change.  He has been evaluated by psychiatry and deemed psychiatrically stable and does not require involuntary commitment. Patient remains tachycardic to 120s despite oral metoprolol almost 2 hours ago. Troponin noted to be elevated at 24.  Remains hypertensive and tachypneic.  Will discuss with hospitalist Dr. Jannifer Franklin to evaluate patient in the emergency department for admission.   Paulette Blanch, MD 08/21/18 [phone]

## 2018-08-21 DIAGNOSIS — F209 Schizophrenia, unspecified: Secondary | ICD-10-CM

## 2018-08-21 DIAGNOSIS — R000 Tachycardia, unspecified: Secondary | ICD-10-CM | POA: Diagnosis not present

## 2018-08-21 DIAGNOSIS — I10 Essential (primary) hypertension: Secondary | ICD-10-CM

## 2018-08-21 DIAGNOSIS — R443 Hallucinations, unspecified: Secondary | ICD-10-CM

## 2018-08-21 DIAGNOSIS — Z9189 Other specified personal risk factors, not elsewhere classified: Secondary | ICD-10-CM

## 2018-08-21 DIAGNOSIS — Z9114 Patient's other noncompliance with medication regimen: Secondary | ICD-10-CM

## 2018-08-21 DIAGNOSIS — R0789 Other chest pain: Secondary | ICD-10-CM

## 2018-08-21 DIAGNOSIS — R4689 Other symptoms and signs involving appearance and behavior: Secondary | ICD-10-CM | POA: Diagnosis not present

## 2018-08-21 LAB — TROPONIN I (HIGH SENSITIVITY)
Troponin I (High Sensitivity): 21 ng/L — ABNORMAL HIGH (ref <18)
Troponin I (High Sensitivity): 19 ng/L — ABNORMAL HIGH (ref <18)

## 2018-08-21 LAB — URINE DRUG SCREEN, QUALITATIVE (ARMC ONLY)
Amphetamines, Ur Screen: NOT DETECTED
Barbiturates, Ur Screen: NOT DETECTED
Benzodiazepine, Ur Scrn: NOT DETECTED
Cannabinoid 50 Ng, Ur ~~LOC~~: NOT DETECTED
Cocaine Metabolite,Ur ~~LOC~~: NOT DETECTED
MDMA (Ecstasy)Ur Screen: NOT DETECTED
Methadone Scn, Ur: NOT DETECTED
Opiate, Ur Screen: NOT DETECTED
Phencyclidine (PCP) Ur S: NOT DETECTED
Tricyclic, Ur Screen: POSITIVE — AB

## 2018-08-21 LAB — TSH: TSH: 1.353 u[IU]/mL (ref 0.350–4.500)

## 2018-08-21 LAB — T4, FREE: Free T4: 0.98 ng/dL (ref 0.61–1.12)

## 2018-08-21 LAB — MRSA PCR SCREENING: MRSA by PCR: NEGATIVE

## 2018-08-21 LAB — POTASSIUM: Potassium: 3.9 mmol/L (ref 3.5–5.1)

## 2018-08-21 MED ORDER — ACETAMINOPHEN 650 MG RE SUPP: 650.0000 mg | Freq: Four times a day (QID) | RECTAL | Status: DC | PRN

## 2018-08-21 MED ORDER — DOCUSATE SODIUM 100 MG PO CAPS
100.0000 mg | ORAL_CAPSULE | Freq: Two times a day (BID) | ORAL | Status: DC
  Administered 2018-08-21 – 2018-08-22 (×3): 100 mg via ORAL
  Filled 2018-08-21 (×3): qty 1

## 2018-08-21 MED ORDER — ONDANSETRON HCL 4 MG/2ML IJ SOLN: 4.0000 mg | Freq: Four times a day (QID) | INTRAMUSCULAR | Status: DC | PRN

## 2018-08-21 MED ORDER — LORAZEPAM 1 MG PO TABS: 1.0000 mg | ORAL_TABLET | Freq: Four times a day (QID) | ORAL | Status: DC | PRN

## 2018-08-21 MED ORDER — SODIUM CHLORIDE 0.9% FLUSH
10.0000 mL | Freq: Two times a day (BID) | INTRAVENOUS | Status: DC
  Administered 2018-08-21: 10 mL via INTRAVENOUS

## 2018-08-21 MED ORDER — TRIHEXYPHENIDYL HCL 5 MG PO TABS
5.0000 mg | ORAL_TABLET | Freq: Three times a day (TID) | ORAL | Status: DC
  Administered 2018-08-21 – 2018-08-22 (×4): 5 mg via ORAL
  Filled 2018-08-21 (×6): qty 1

## 2018-08-21 MED ORDER — HALOPERIDOL LACTATE 5 MG/ML IJ SOLN
INTRAMUSCULAR | Status: AC
  Filled 2018-08-21: qty 1

## 2018-08-21 MED ORDER — ACETAMINOPHEN 325 MG PO TABS: 650.0000 mg | ORAL_TABLET | Freq: Four times a day (QID) | ORAL | Status: DC | PRN

## 2018-08-21 MED ORDER — UMECLIDINIUM-VILANTEROL 62.5-25 MCG/INH IN AEPB
1.0000 | INHALATION_SPRAY | Freq: Every day | RESPIRATORY_TRACT | Status: DC
  Administered 2018-08-21 – 2018-08-22 (×2): 1 via RESPIRATORY_TRACT
  Filled 2018-08-21: qty 14

## 2018-08-21 MED ORDER — CHLORTHALIDONE 25 MG PO TABS
12.5000 mg | ORAL_TABLET | Freq: Every day | ORAL | Status: DC
  Administered 2018-08-21 – 2018-08-22 (×2): 12.5 mg via ORAL
  Filled 2018-08-21 (×2): qty 0.5

## 2018-08-21 MED ORDER — ENOXAPARIN SODIUM 40 MG/0.4ML ~~LOC~~ SOLN
40.0000 mg | SUBCUTANEOUS | Status: DC
  Administered 2018-08-21 – 2018-08-22 (×2): 40 mg via SUBCUTANEOUS
  Filled 2018-08-21 (×2): qty 0.4

## 2018-08-21 MED ORDER — ALBUTEROL SULFATE (2.5 MG/3ML) 0.083% IN NEBU: 2.5000 mg | INHALATION_SOLUTION | RESPIRATORY_TRACT | Status: DC | PRN

## 2018-08-21 MED ORDER — ONDANSETRON HCL 4 MG PO TABS: 4.0000 mg | ORAL_TABLET | Freq: Four times a day (QID) | ORAL | Status: DC | PRN

## 2018-08-21 MED ORDER — QUETIAPINE FUMARATE 25 MG PO TABS
12.5000 mg | ORAL_TABLET | Freq: Two times a day (BID) | ORAL | Status: DC
  Administered 2018-08-21 – 2018-08-22 (×3): 12.5 mg via ORAL
  Filled 2018-08-21 (×3): qty 1

## 2018-08-21 MED ORDER — HALOPERIDOL LACTATE 5 MG/ML IJ SOLN
5.0000 mg | Freq: Once | INTRAMUSCULAR | Status: AC
  Administered 2018-08-21: 5 mg via INTRAMUSCULAR

## 2018-08-21 NOTE — Progress Notes (Signed)
[place] at [place] NAME: Carlos Nelson    MR#:  [id number]  DATE OF BIRTH:  10/06/1950  SUBJECTIVE:  CHIEF COMPLAINT:    REVIEW OF SYSTEMS:  CONSTITUTIONAL: No fever, fatigue or weakness.  EYES: No blurred or double vision.  EARS, NOSE, AND THROAT: No tinnitus or ear pain.  RESPIRATORY: No cough, shortness of breath, wheezing or hemoptysis.  CARDIOVASCULAR: No chest pain, orthopnea, edema.  GASTROINTESTINAL: No nausea, vomiting, diarrhea or abdominal pain.  GENITOURINARY: No dysuria, hematuria.  ENDOCRINE: No polyuria, nocturia,  HEMATOLOGY: No anemia, easy bruising or bleeding SKIN: No rash or lesion. MUSCULOSKELETAL: No joint pain or arthritis.   NEUROLOGIC: No tingling, numbness, weakness.  PSYCHIATRY: No anxiety or depression.   DRUG ALLERGIES:   Allergies  Allergen Reactions  . Ace Inhibitors Swelling    VITALS:  Blood pressure (!) 154/87, pulse 91, temperature 99.3 F (37.4 C), temperature source Oral, resp. rate 19, height 5\' 4"  (1.626 m), weight 53.1 kg, SpO2 98 %.  PHYSICAL EXAMINATION:  GENERAL:  68 y.o.-year-old patient lying in the bed with no acute distress.  EYES: Pupils equal, round, reactive to light and accommodation. No scleral icterus. Extraocular muscles intact.  HEENT: Head atraumatic, normocephalic. Oropharynx and nasopharynx clear.  NECK:  Supple, no jugular venous distention. No thyroid enlargement, no tenderness.  LUNGS: Normal breath sounds bilaterally, no wheezing, rales,rhonchi or crepitation. No use of accessory muscles of respiration.  CARDIOVASCULAR: S1, S2 normal. No murmurs, rubs, or gallops.  ABDOMEN: Soft, nontender, nondistended. Bowel sounds present. No organomegaly or mass.  EXTREMITIES: No pedal edema, cyanosis, or clubbing.  NEUROLOGIC: Cranial nerves II through XII are intact. Muscle strength 5/5 in all extremities. Sensation intact. Gait not checked.  PSYCHIATRIC: The patient  is alert and oriented x 3.  SKIN: No obvious rash, lesion, or ulcer.    LABORATORY PANEL:   CBC Recent Labs  Lab 08/20/18 2013  WBC 13.9*  HGB 16.2  HCT 47.7  PLT 269   ------------------------------------------------------------------------------------------------------------------  Chemistries  Recent Labs  Lab 08/16/18 1113 08/20/18 2013 08/21/18 1313  NA 140 137  --   K 3.4* 3.0* 3.9  CL 98 93*  --   CO2 29 30  --   GLUCOSE 116* 113*  --   BUN 19 14  --   CREATININE 1.33* 1.25*  --   CALCIUM 10.1 10.6*  --   AST 47*  --   --   ALT 87*  --   --   ALKPHOS 76  --   --   BILITOT 1.0  --   --    ------------------------------------------------------------------------------------------------------------------  Cardiac Enzymes No results for input(s): TROPONINI in the last 168 hours. ------------------------------------------------------------------------------------------------------------------  RADIOLOGY:  Dg Chest 2 View  Result Date: 08/20/2018 CLINICAL DATA:  Chest pain EXAM: CHEST - 2 VIEW COMPARISON:  04/05/2018 FINDINGS: Hyperinflation without focal opacity or pleural effusion. Normal cardiomediastinal silhouette. No pneumothorax. Chronic compression fractures of the mid to upper thoracic spine. IMPRESSION: No active cardiopulmonary disease. Electronically Signed   By: Donavan Foil M.D.   On: 08/20/2018 23:39    EKG:   Orders placed or performed during the hospital encounter of 08/20/18  . EKG 12-Lead  . EKG 12-Lead    ASSESSMENT AND PLAN:    This is a 68 year old male admitted for tachycardia. 1.    Chest pain with tachycardia: Sinus; persistent.  Probably demand ischemia  Troponins are non-trending continue to monitor telemetry.  Continue  metoprolol.  Tachycardia may be secondary to multiple psychiatric medications, or conversely lack of said medications that may contribute to his agitation.    Cardiology is seen and not recommending any cardiac  interventions at this time  2.  Hypertension: Intermittently controlled.  Elevated when patient is more agitated.    Cardiology is recommending to change chlorthalidone to amlodipine as the patient was hypokalemic at the time of presentation.  Labetalol as needed.  3.  COPD: Stable; continue Anoro Ellipta and albuterol as needed.  4.  CKD: Stage III; avoid nephrotoxic agents.  5.  Schizophrenia: Continue trazodone, Seroquel, trihexyphenidyl and Depakote Consult psychiatry  DVT prophylaxis Lovenox subcu  All the records are reviewed and case discussed with Care Management/Social Workerr. Management plans discussed with the patient, family and they are in agreement.  CODE STATUS: FC  TOTAL TIME TAKING CARE OF THIS PATIENT: 36  minutes.   POSSIBLE D/C IN 1-2  DAYS, DEPENDING ON CLINICAL CONDITION.  Note: This dictation was prepared with Dragon dictation along with smaller phrase technology. Any transcriptional errors that result from this process are unintentional.   Nicholes Mango M.D on 08/21/2018 at 4:05 PM  Between 7am to 6pm - Pager - [phone] After 6pm go to [email or web address] - password [email or web address] [facility]  Office  [phone]  CC: Primary care physician; Jodi Marble, MD

## 2018-08-21 NOTE — Consult Note (Signed)
PHARMACY CONSULT NOTE - FOLLOW UP  Pharmacy Consult for Electrolyte Monitoring and Replacement   Recent Labs: Potassium (mmol/L)  Date Value  08/21/2018 3.9  11/09/2013 4.3   Calcium (mg/dL)  Date Value  08/20/2018 10.6 (H)   Calcium, Total (mg/dL)  Date Value  11/09/2013 9.3   Albumin (g/dL)  Date Value  08/16/2018 4.4  11/09/2013 4.1   Sodium (mmol/L)  Date Value  08/20/2018 137  11/09/2013 143    Assessment: Patient presents with chest pain. Presently hypokalemic; potassium was replaced yesterday but will need repeat labs to assess need for additional replacement.   8/6 @ 1313: K 3.9. No replacements needed at this time.   Goal of Therapy:  Electrolytes WNL  Plan:  No replacements needed at this time.   Will follow electrolytes with AM labs.   Rowland Lathe ,PharmD Clinical Pharmacist 08/21/2018 2:22 PM

## 2018-08-21 NOTE — Progress Notes (Signed)
Pt in room becoming agitated, with hallucinations, talking to a person that was not in room, and then began to swing and kick the air. Attempted to redirect pt, but pt not responding to this nurse. MD notified, orders placed. Will continue to monitor.

## 2018-08-21 NOTE — Progress Notes (Signed)
Pt up from ED. Pt alert to name. Very fidigety, can not sit still. Telemetry placed and called to CCMD. Pt has money locked up in safe.

## 2018-08-21 NOTE — ED Notes (Signed)
P agrees to be admitted. Pt given juice and a food tray.

## 2018-08-21 NOTE — Care Management Obs Status (Signed)
[facility] NOTIFICATION   Patient Details  Name: Carlos Nelson MRN: [id number] Date of Birth: [birth year]   Medicare Observation Status Notification Given:  Yes    Cecil Cobbs 08/21/2018, 6:54 PM

## 2018-08-21 NOTE — TOC Initial Note (Signed)
Transition of Care [facility]) - Initial/Assessment Note    Patient Details  Name: Carlos Nelson MRN: [id number] Date of Birth: 11-12-1950  [facility] CM/SW Contact:    Ross Ludwig, LCSW Phone Number: 08/21/2018, 7:03 PM  Clinical Narrative:                  Patient is a 68 year old male who is alert and oriented x2.  Patient was sleeping, CSW completed assesment by reviewing patient's chart.  Patient is from a family care home, patient has schizophrenia, he has not been taking his meds like he is supposed to according to group home.  Patient informed group home that he wanted the police to take him to the hospital because he did not feel right with his meds.  Psych has been consulted and are following patient.  CSW to continue to follow patient's progress throughout discharge planning.  Expected Discharge Plan: Group Home Barriers to Discharge: Continued Medical Work up   Patient Goals and CMS Choice Patient states their goals for this hospitalization and ongoing recovery are:: To return back home      Expected Discharge Plan and Services Expected Discharge Plan: Group Home In-house Referral: Clinical Social Work     Living arrangements for the past 2 months: Group Home                                      Prior Living Arrangements/Services Living arrangements for the past 2 months: [place] Lives with:: Facility Resident Patient language and need for interpreter reviewed:: Yes Do you feel safe going back to the place where you live?: Yes      Need for Family Participation in Patient Care: Yes (Comment) Care giver support system in place?: Yes (comment)   Criminal Activity/Legal Involvement Pertinent to Current Situation/Hospitalization: No - Comment as needed  Activities of Daily Living Home Assistive Devices/Equipment: None ADL Screening (condition at time of admission) Is the patient deaf or have difficulty hearing?: No Does the patient  have difficulty seeing, even when wearing glasses/contacts?: No Does the patient have difficulty concentrating, remembering, or making decisions?: Yes Patient able to express need for assistance with ADLs?: Yes Does the patient have difficulty dressing or bathing?: No Independently performs ADLs?: Yes (appropriate for developmental age) Does the patient have difficulty walking or climbing stairs?: No Weakness of Legs: None Weakness of Arms/Hands: None  Permission Sought/Granted Permission sought to share information with : Facility Sport and exercise psychologist, Family Supports Permission granted to share information with : Yes, Release of Information Signed  Share Information with NAMEPAUL, TRETTIN Sister [phone] or Briant Cedar [phone]           Emotional Assessment Appearance:: Appears stated age   Affect (typically observed): Appropriate, Calm Orientation: : Oriented to Self, Oriented to Place, Oriented to  Time Alcohol / Substance Use: Tobacco Use Psych Involvement: Yes (comment)  Admission diagnosis:  Hypokalemia [E87.6] Tachycardia [R00.0] Disorganized behavior [R46.89] Schizophrenia, unspecified type ([place]) [F20.9] Chest pain, unspecified type [R07.9] Patient Active Problem List   Diagnosis Date Noted  . Tachycardia 08/21/2018  . Schizophrenia, chronic with acute exacerbation ([place]) 08/17/2018  . Elevated PSA 05/24/2015   PCP:  Jodi Marble, MD Pharmacy:   [place], [place] - [address] [place] [address] Phone: [phone] Fax: [phone]  Social Determinants of Health (SDOH) Interventions    Readmission Risk Interventions No flowsheet data found.

## 2018-08-21 NOTE — Consult Note (Signed)
[facility] Face-to-Face Psychiatry Consult   Reason for Consult:  Hallucinations Referring Physician:  Hospitalist Patient Identification: KYI ROMANELLO MRN:  [id number] Principal Diagnosis: <principal problem not specified> Diagnosis:  Active Problems:   Tachycardia   Total Time spent with patient: 45 minutes  Subjective:   Carlos Nelson is a 68 y.o. male patient reports that he came to the hospital due to abdominal issues. He will not admit to refusing his medications. He denies any suicidal or homicidal ideations and denies any hallucinations currently. He also denied any hallucinations last night or earlier today.  HPI:  Per Dr. Charna Archer: 68 y.o. male with history of schizophrenia, hypertension, COPD who presents to the ED complaining of chest pain.  Patient reports he has burning discomfort along the right side of his chest and upper abdomen which is been constant over the past couple of days.  He states "it feels like I am bleeding in my stomach".  He denies any blood in his stool or any dark tarry stool, has not had any hematemesis.  History is limited as patient frequently speaks incoherently and difficult to redirect at times.  Spoke with patient's caretaker over the phone, who states that patient has been very erratic since stopping taking his medications about 1 week ago.  He was seen in the ED for similar complaints 3 days ago, evaluated by psychiatry and determined to be appropriate for discharge home.  She states that he agreed to take his medicines while in the hospital, but has again stopped taking them since he is returned home.  Earlier this evening, he was threatening towards other residents at the group home and caretaker reports concern for their safety.  Patient is seen by this provider face-to face.Patient presents lying in bed and is calm and cooperative. He denies any suicidal or homicidal ideations and also states that he has been taking his medications at the group home. It  has been reported that he was having active hallucinations on the medical floor last night and this morning, but presents with none now. His medications have been restarted and he presents calm. His speech is difficult to understand as it is garbled. The RN reports that he has been calm and sleeping most of the day. She also reports that the group home staff called her and stating that he must be on a long acting injectable before he can return. Unless he changes and meets criteria for psychiatric admission, this should be done by his outpatient psychiatric provider. Patient was seen by this provider 3 days ago with similar presentation and the patient remained at the hospital and was monitored and then cleared to return to the group home. At the current moment he does not present with any imminent threat to himself or to others. At this time he does not meet inpatient criteria and is psychiatrically cleared. However, we will review the patient tomorrow to assess for progression and stabilization.   Past Psychiatric History: Schizophrenia  Risk to Self:  NO Risk to Others:  NO Prior Inpatient Therapy:   Prior Outpatient Therapy:    Past Medical History:  Past Medical History:  Diagnosis Date  . COPD (chronic obstructive pulmonary disease) ([place])   . Hypertension   . Schizophrenia [facility])     Past Surgical History:  Procedure Laterality Date  . COLONOSCOPY    . COLONOSCOPY WITH PROPOFOL N/A 12/13/2016   Procedure: COLONOSCOPY WITH PROPOFOL;  Surgeon: Lollie Sails, MD;  Location: [facility] ENDOSCOPY;  Service:  Endoscopy;  Laterality: N/A;  . ESOPHAGOGASTRODUODENOSCOPY (EGD) WITH PROPOFOL N/A 12/13/2016   Procedure: ESOPHAGOGASTRODUODENOSCOPY (EGD) WITH PROPOFOL;  Surgeon: Lollie Sails, MD;  Location: [facility] ENDOSCOPY;  Service: Endoscopy;  Laterality: N/A;  . left arm surgery     Family History:  Family History  Problem Relation Age of Onset  . Prostate cancer Neg Hx   . Bladder Cancer  Neg Hx    Family Psychiatric  History: None reported Social History:  Social History   Substance and Sexual Activity  Alcohol Use Yes  . Alcohol/week: 0.0 standard drinks   Comment: beer     Social History   Substance and Sexual Activity  Drug Use Yes  . Types: Marijuana   Comment: marjuana    Social History   Socioeconomic History  . Marital status: Single    Spouse name: Not on file  . Number of children: Not on file  . Years of education: Not on file  . Highest education level: Not on file  Occupational History  . Not on file  Social Needs  . Financial resource strain: Not on file  . Food insecurity    Worry: Not on file    Inability: Not on file  . Transportation needs    Medical: Not on file    Non-medical: Not on file  Tobacco Use  . Smoking status: Former Smoker    Packs/day: 1.00    Types: Cigarettes  . Smokeless tobacco: Never Used  Substance and Sexual Activity  . Alcohol use: Yes    Alcohol/week: 0.0 standard drinks    Comment: beer  . Drug use: Yes    Types: Marijuana    Comment: marjuana  . Sexual activity: Not on file  Lifestyle  . Physical activity    Days per week: Not on file    Minutes per session: Not on file  . Stress: Not on file  Relationships  . Social Herbalist on phone: Not on file    Gets together: Not on file    Attends religious service: Not on file    Active member of club or organization: Not on file    Attends meetings of clubs or organizations: Not on file    Relationship status: Not on file  Other Topics Concern  . Not on file  Social History Narrative  . Not on file   Additional Social History:    Allergies:   Allergies  Allergen Reactions  . Ace Inhibitors Swelling    Labs:  Results for orders placed or performed during the hospital encounter of 08/20/18 (from the past 48 hour(s))  CBC with Differential     Status: Abnormal   Collection Time: 08/20/18  8:13 PM  Result Value Ref Range   WBC  13.9 (H) 4.0 - 10.5 K/uL   RBC 5.43 4.22 - 5.81 MIL/uL   Hemoglobin 16.2 13.0 - 17.0 g/dL   HCT 47.7 39.0 - 52.0 %   MCV 87.8 80.0 - 100.0 fL   MCH 29.8 26.0 - 34.0 pg   MCHC 34.0 30.0 - 36.0 g/dL   RDW 11.9 11.5 - 15.5 %   Platelets 269 150 - 400 K/uL   nRBC 0.0 0.0 - 0.2 %   Neutrophils Relative % 69 %   Neutro Abs 9.4 (H) 1.7 - 7.7 K/uL   Lymphocytes Relative 21 %   Lymphs Abs 3.0 0.7 - 4.0 K/uL   Monocytes Relative 10 %   Monocytes Absolute 1.4 (H)  0.1 - 1.0 K/uL   Eosinophils Relative 0 %   Eosinophils Absolute 0.1 0.0 - 0.5 K/uL   Basophils Relative 0 %   Basophils Absolute 0.0 0.0 - 0.1 K/uL   Immature Granulocytes 0 %   Abs Immature Granulocytes 0.06 0.00 - 0.07 K/uL    Comment: Performed at [facility], [address] [place], [place] [address]  Basic metabolic panel     Status: Abnormal   Collection Time: 08/20/18  8:13 PM  Result Value Ref Range   Sodium 137 135 - 145 mmol/L   Potassium 3.0 (L) 3.5 - 5.1 mmol/L   Chloride 93 (L) 98 - 111 mmol/L   CO2 30 22 - 32 mmol/L   Glucose, Bld 113 (H) 70 - 99 mg/dL   BUN 14 8 - 23 mg/dL   Creatinine, Ser 1.25 (H) 0.61 - 1.24 mg/dL   Calcium 10.6 (H) 8.9 - 10.3 mg/dL   GFR calc non Af Amer 58 (L) >60 mL/min   GFR calc Af Amer >60 >60 mL/min   Anion gap 14 5 - 15    Comment: Performed at [facility], [place], [place] [address]  Troponin I (High Sensitivity)     Status: Abnormal   Collection Time: 08/20/18  8:13 PM  Result Value Ref Range   Troponin I (High Sensitivity) 24 (H) <18 ng/L    Comment: (NOTE) Elevated high sensitivity troponin I (hsTnI) values and significant  changes across serial measurements may suggest ACS but many other  chronic and acute conditions are known to elevate hsTnI results.  Refer to the "Links" section for chest pain algorithms and additional  guidance. Performed at [facility], [address] [place], [place] [address]   Valproic acid  level     Status: Abnormal   Collection Time: 08/20/18  8:13 PM  Result Value Ref Range   Valproic Acid Lvl <10 (L) 50.0 - 100.0 ug/mL    Comment: Performed at [facility], [address] [place], [place] [address]  TSH     Status: None   Collection Time: 08/20/18  8:13 PM  Result Value Ref Range   TSH 1.353 0.350 - 4.500 uIU/mL    Comment: Performed by a 3rd Generation assay with a functional sensitivity of <=0.01 uIU/mL. Performed at [facility], [address] [place], [place] [address]   T4, free     Status: None   Collection Time: 08/20/18  8:13 PM  Result Value Ref Range   Free T4 0.98 0.61 - 1.12 ng/dL    Comment: (NOTE) Biotin ingestion may interfere with free T4 tests. If the results are inconsistent with the TSH level, previous test results, or the clinical presentation, then consider biotin interference. If needed, order repeat testing after stopping biotin. Performed at [facility], [address] [place], [place] [address]   Urine Drug Screen, Qualitative     Status: Abnormal   Collection Time: 08/20/18  8:13 PM  Result Value Ref Range   Tricyclic, Ur Screen POSITIVE (A) NONE DETECTED   Amphetamines, Ur Screen NONE DETECTED NONE DETECTED   MDMA (Ecstasy)Ur Screen NONE DETECTED NONE DETECTED   Cocaine Metabolite,Ur [place] NONE DETECTED NONE DETECTED   Opiate, Ur Screen NONE DETECTED NONE DETECTED   Phencyclidine (PCP) Ur S NONE DETECTED NONE DETECTED   Cannabinoid 50 Ng, Ur [place] NONE DETECTED NONE DETECTED   Barbiturates, Ur Screen NONE DETECTED NONE DETECTED   Benzodiazepine, Ur Scrn  NONE DETECTED NONE DETECTED   Methadone Scn, Ur NONE DETECTED NONE DETECTED    Comment: (NOTE) Tricyclics + metabolites, urine    Cutoff 1000 ng/mL Amphetamines + metabolites, urine  Cutoff 1000 ng/mL MDMA (Ecstasy), urine              Cutoff 500 ng/mL Cocaine Metabolite, urine          Cutoff 300 ng/mL Opiate + metabolites, urine        Cutoff 300  ng/mL Phencyclidine (PCP), urine         Cutoff 25 ng/mL Cannabinoid, urine                 Cutoff 50 ng/mL Barbiturates + metabolites, urine  Cutoff 200 ng/mL Benzodiazepine, urine              Cutoff 200 ng/mL Methadone, urine                   Cutoff 300 ng/mL The urine drug screen provides only a preliminary, unconfirmed analytical test result and should not be used for non-medical purposes. Clinical consideration and professional judgment should be applied to any positive drug screen result due to possible interfering substances. A more specific alternate chemical method must be used in order to obtain a confirmed analytical result. Gas chromatography / mass spectrometry (GC/MS) is the preferred confirmat ory method. Performed at [facility], [place], [place] [address]   Troponin I (High Sensitivity)     Status: Abnormal   Collection Time: 08/21/18  4:58 AM  Result Value Ref Range   Troponin I (High Sensitivity) 21 (H) <18 ng/L    Comment: (NOTE) Elevated high sensitivity troponin I (hsTnI) values and significant  changes across serial measurements may suggest ACS but many other  chronic and acute conditions are known to elevate hsTnI results.  Refer to the "Links" section for chest pain algorithms and additional  guidance. Performed at [facility], [place], [place] [address]   Troponin I (High Sensitivity)     Status: Abnormal   Collection Time: 08/21/18  7:09 AM  Result Value Ref Range   Troponin I (High Sensitivity) 19 (H) <18 ng/L    Comment: (NOTE) Elevated high sensitivity troponin I (hsTnI) values and significant  changes across serial measurements may suggest ACS but many other  chronic and acute conditions are known to elevate hsTnI results.  Refer to the "Links" section for chest pain algorithms and additional  guidance. Performed at [facility], [address] [place], [place] [address]    Potassium     Status: None   Collection Time: 08/21/18  1:13 PM  Result Value Ref Range   Potassium 3.9 3.5 - 5.1 mmol/L    Comment: Performed at [facility], [address] [place], [place] [address]    Current Facility-Administered Medications  Medication Dose Route Frequency Provider Last Rate Last Dose  . acetaminophen (TYLENOL) tablet 650 mg  650 mg Oral Q6H PRN Harrie Foreman, MD       Or  . acetaminophen (TYLENOL) suppository 650 mg  650 mg Rectal Q6H PRN Harrie Foreman, MD      . albuterol (PROVENTIL) (2.5 MG/3ML) 0.083% nebulizer solution 2.5 mg  2.5 mg Nebulization Q4H PRN Harrie Foreman, MD      . chlorthalidone (HYGROTON) tablet 12.5 mg  12.5 mg Oral Daily Harrie Foreman, MD   12.5 mg at 08/21/18 1124  .  divalproex (DEPAKOTE ER) 24 hr tablet 250 mg  250 mg Oral BID Harrie Foreman, MD   250 mg at 08/21/18 1124  . docusate sodium (COLACE) capsule 100 mg  100 mg Oral BID Harrie Foreman, MD   100 mg at 08/21/18 1125  . enoxaparin (LOVENOX) injection 40 mg  40 mg Subcutaneous Q24H Harrie Foreman, MD   40 mg at 08/21/18 1128  . haloperidol lactate (HALDOL) 5 MG/ML injection           . LORazepam (ATIVAN) tablet 1 mg  1 mg Oral Q6H PRN Harrie Foreman, MD      . metoprolol tartrate (LOPRESSOR) tablet 50 mg  50 mg Oral BID Harrie Foreman, MD   50 mg at 08/21/18 1124  . ondansetron (ZOFRAN) tablet 4 mg  4 mg Oral Q6H PRN Harrie Foreman, MD       Or  . ondansetron [facility]) injection 4 mg  4 mg Intravenous Q6H PRN Harrie Foreman, MD      . QUEtiapine (SEROQUEL XR) 24 hr tablet 400 mg  400 mg Oral QHS Harrie Foreman, MD   400 mg at 08/20/18 2350  . QUEtiapine (SEROQUEL) tablet 12.5 mg  12.5 mg Oral BID BM Harrie Foreman, MD   12.5 mg at 08/21/18 1125  . traZODone (DESYREL) tablet 50 mg  50 mg Oral QHS Harrie Foreman, MD   50 mg at 08/20/18 2329  . trihexyphenidyl (ARTANE) tablet 5 mg  5 mg Oral TID WC Harrie Foreman,  MD   5 mg at 08/21/18 1128  . umeclidinium-vilanterol (ANORO ELLIPTA) 62.5-25 MCG/INH 1 puff  1 puff Inhalation Daily Harrie Foreman, MD   1 puff at 08/21/18 1130    Musculoskeletal: Strength & Muscle Tone: decreased Gait & Station: Remained in bed during evaluation Patient leans: N/A  Psychiatric Specialty Exam: Physical Exam  Nursing note and vitals reviewed. Constitutional: He appears well-developed and well-nourished.  Cardiovascular: Normal rate.  Respiratory: Effort normal.  Musculoskeletal: Normal range of motion.  Neurological: He is alert.    Review of Systems  Constitutional: Negative.   HENT: Negative.   Eyes: Negative.   Respiratory: Negative.   Cardiovascular: Negative.   Genitourinary: Negative.   Musculoskeletal: Negative.   Skin: Negative.   Neurological: Positive for tremors (Bilateral hands).  Endo/Heme/Allergies: Negative.   Psychiatric/Behavioral: Positive for hallucinations.    Blood pressure (!) 154/87, pulse 91, temperature 99.3 F (37.4 C), temperature source Oral, resp. rate 19, height 5\' 4"  (1.626 m), weight 53.1 kg, SpO2 98 %.Body mass index is 20.08 kg/m.  General Appearance: Disheveled  Eye Contact:  Fair  Speech:  Garbled, but somewhat understandable  Volume:  Decreased  Mood:  Euthymic  Affect:  Flat  Thought Process:  Coherent and Descriptions of Associations: Intact  Orientation:  Other:  person and place  Thought Content:  WDL  Suicidal Thoughts:  No  Homicidal Thoughts:  No  Memory:  Immediate;   Fair Recent;   Fair  Judgement:  Fair  Insight:  Fair  Psychomotor Activity:  Normal  Concentration:  Concentration: Fair  Recall:  [employer] of Knowledge:  Fair  Language:  Poor  Akathisia:  No  Handed:  Right  AIMS (if indicated):     Assets:  Communication Skills Desire for Improvement Financial Resources/Insurance Housing Resilience Social Support Transportation  ADL's:  Intact  Cognition:  WNL  Sleep:  Treatment Plan Summary: Daily contact with patient to assess and evaluate symptoms and progress in treatment and Medication management  Continue home medications  Disposition: Psychiatry will follow patient to monitor for progression and stabilization. At the moment the patient does not meet inpatient criteria.  [place], FNP 08/21/2018 4:36 PM

## 2018-08-21 NOTE — Consult Note (Signed)
PHARMACY CONSULT NOTE - FOLLOW UP  Pharmacy Consult for Electrolyte Monitoring and Replacement   Recent Labs: Potassium (mmol/L)  Date Value  08/20/2018 3.0 (L)  11/09/2013 4.3   Calcium (mg/dL)  Date Value  08/20/2018 10.6 (H)   Calcium, Total (mg/dL)  Date Value  11/09/2013 9.3   Albumin (g/dL)  Date Value  08/16/2018 4.4  11/09/2013 4.1   Sodium (mmol/L)  Date Value  08/20/2018 137  11/09/2013 143    Assessment: Patient presents with chest pain. Presently hypokalemic; potassium was replaced yesterday but will need repeat labs to assess need for additional replacement.   Goal of Therapy:  Electrolytes WNL  Plan:  Will recheck potassium.   Will follow electrolytes with AM labs.   Rowland Lathe ,PharmD Clinical Pharmacist 08/21/2018 12:30 PM

## 2018-08-21 NOTE — Consult Note (Signed)
Cardiology Consultation:   Patient ID: [name] MRN: [id number]; DOB: 1950-12-24  Admit date: 08/20/2018 Date of Consult: 08/21/2018  Primary Care Provider: Jodi Marble, MD Primary Cardiologist: No primary care provider on file. Primary Electrophysiologist:  None    Patient Profile:   Carlos Nelson is a 68 y.o. male with a hx of hypertension, schizophrenia, COPD who is being seen today for the evaluation of chest pain at the request of Dr. Margaretmary Eddy.  History of Present Illness:   [name] is a limited historian. He tells me that he had "lung pain" for an unclear amount of time. It is constant, mild, no clear aggravating or alleviating factors. When asked where the pain is, he points across the right side of his chest and right upper abdomen. He says he is feeling good right now.  On my interview, he is comfortable in bed, appears calm. He denies fever, chills, sputum. Denies melena or hematochezia. Otherwise unable to perform further review of systems as he answers incoherently.  I reviewed his chart and workup thus far. High sensitivity troponins are low risk based on the algorithm, as they were largely flat at 24-21-19. No rise/delta to suggest ACS. Telemetry shows sinus tachycardia. Chest x ray without active processes. ECG yesterday shows sinus tachycardia without acute changes.  Past Medical History:  Diagnosis Date  . COPD (chronic obstructive pulmonary disease) ([place])   . Hypertension   . Schizophrenia [facility])     Past Surgical History:  Procedure Laterality Date  . COLONOSCOPY    . COLONOSCOPY WITH PROPOFOL N/A 12/13/2016   Procedure: COLONOSCOPY WITH PROPOFOL;  Surgeon: Lollie Sails, MD;  Location: [facility] ENDOSCOPY;  Service: Endoscopy;  Laterality: N/A;  . ESOPHAGOGASTRODUODENOSCOPY (EGD) WITH PROPOFOL N/A 12/13/2016   Procedure: ESOPHAGOGASTRODUODENOSCOPY (EGD) WITH PROPOFOL;  Surgeon: Lollie Sails, MD;  Location: [facility] ENDOSCOPY;  Service:  Endoscopy;  Laterality: N/A;  . left arm surgery       Home Medications:  Prior to Admission medications   Medication Sig Start Date End Date Taking? Authorizing Provider  albuterol (PROVENTIL HFA;VENTOLIN HFA) 108 (90 Base) MCG/ACT inhaler Inhale 1 puff into the lungs every 6 (six) hours as needed for wheezing or shortness of breath.   Yes [provider]  chlorthalidone (HYGROTON) 25 MG tablet Take 12.5 mg by mouth daily.   Yes [provider]  QUEtiapine (SEROQUEL XR) 400 MG 24 hr tablet Take 400 mg by mouth at bedtime.   Yes [provider]  traZODone (DESYREL) 50 MG tablet Take 50 mg by mouth at bedtime.   Yes [provider]  trihexyphenidyl (ARTANE) 5 MG tablet Take 5 mg by mouth 3 (three) times daily with meals.   Yes [provider]  umeclidinium-vilanterol (ANORO ELLIPTA) 62.5-25 MCG/INH AEPB Inhale 1 puff into the lungs daily.   Yes [provider]  divalproex (DEPAKOTE ER) 250 MG 24 hr tablet Take 1 tablet (250 mg total) by mouth 2 (two) times daily. 08/17/18   Patrecia Pour, NP  LORazepam (ATIVAN) 1 MG tablet Take 1 tablet (1 mg total) by mouth every 6 (six) hours as needed for anxiety. 08/17/18   Patrecia Pour, NP  QUEtiapine (SEROQUEL) 25 MG tablet Take 0.5 tablets (12.5 mg total) by mouth 2 (two) times daily. 08/17/18   Patrecia Pour, NP    Inpatient Medications: Scheduled Meds: . chlorthalidone  12.5 mg Oral Daily  . divalproex  250 mg Oral BID  . docusate sodium  100 mg Oral BID  . enoxaparin (LOVENOX) injection  40 mg Subcutaneous Q24H  . haloperidol lactate      . metoprolol tartrate  50 mg Oral BID  . QUEtiapine  400 mg Oral QHS  . QUEtiapine  12.5 mg Oral BID BM  . traZODone  50 mg Oral QHS  . trihexyphenidyl  5 mg Oral TID WC  . umeclidinium-vilanterol  1 puff Inhalation Daily   Continuous Infusions:  PRN Meds: acetaminophen **OR** acetaminophen, albuterol, LORazepam, ondansetron **OR** ondansetron  (ZOFRAN) IV  Allergies:    Allergies  Allergen Reactions  . Ace Inhibitors Swelling    Social History:   Social History   Socioeconomic History  . Marital status: Single    Spouse name: Not on file  . Number of children: Not on file  . Years of education: Not on file  . Highest education level: Not on file  Occupational History  . Not on file  Social Needs  . Financial resource strain: Not on file  . Food insecurity    Worry: Not on file    Inability: Not on file  . Transportation needs    Medical: Not on file    Non-medical: Not on file  Tobacco Use  . Smoking status: Former Smoker    Packs/day: 1.00    Types: Cigarettes  . Smokeless tobacco: Never Used  Substance and Sexual Activity  . Alcohol use: Yes    Alcohol/week: 0.0 standard drinks    Comment: beer  . Drug use: Yes    Types: Marijuana    Comment: marjuana  . Sexual activity: Not on file  Lifestyle  . Physical activity    Days per week: Not on file    Minutes per session: Not on file  . Stress: Not on file  Relationships  . Social Herbalist on phone: Not on file    Gets together: Not on file    Attends religious service: Not on file    Active member of club or organization: Not on file    Attends meetings of clubs or organizations: Not on file    Relationship status: Not on file  . Intimate partner violence    Fear of current or ex partner: Not on file    Emotionally abused: Not on file    Physically abused: Not on file    Forced sexual activity: Not on file  Other Topics Concern  . Not on file  Social History Narrative  . Not on file    Family History:    Family History  Problem Relation Age of Onset  . Prostate cancer Neg Hx   . Bladder Cancer Neg Hx      ROS:  Please see the history of present illness.  Negative for fevers/chills Negative for sputum Negative for melena/hematochezia Cardio as per HPI All other ROS limited by patient's inability to give history.      Physical Exam/Data:   Vitals:   08/21/18 0330 08/21/18 0448 08/21/18 0741 08/21/18 1124  BP: 101/72 103/87 134/86   Pulse: (!) 105 85 (!) 108 (!) 109  Resp: 18 18 19    Temp:  98.6 F (37 C) 98.5 F (36.9 C)   TempSrc:  Oral Oral   SpO2: 96% 99% 97%   Weight:      Height:        Intake/Output Summary (Last 24 hours) at 08/21/2018 1130 Last data filed at 08/21/2018 1112 Gross per 24 hour  Intake  1000 ml  Output 300 ml  Net 700 ml   Last 3 Weights 08/20/2018 12/13/2016 06/21/2015  Weight (lbs) 117 lb 136 lb 120 lb  Weight (kg) 53.071 kg 61.689 kg 54.432 kg     Body mass index is 20.08 kg/m.  General:  Well nourished, well developed, in no acute distress HEENT: normal Lymph: no adenopathy Neck: no JVD Endocrine:  No thryomegaly Vascular:  RA and DP pulses 2+ bilaterally Cardiac:  tachycardic S1, S2; RRR; no murmur/rub/gallops Lungs:  clear to auscultation bilaterally, no wheezing, rhonchi or rales  Abd: soft, mildly TTP RUQ but not distended, no rebound or guarding Ext: no edema Musculoskeletal:  No deformities Skin: warm and dry  Neuro:  no focal abnormalities noted Psych:  Calm, limited historian  EKG:  The EKG was personally reviewed and demonstrates:  Sinus tachycardia Telemetry:  Telemetry was personally reviewed and demonstrates:  Sinus tachycardia  Relevant CV Studies: None  Laboratory Data:  High Sensitivity Troponin:   Recent Labs  Lab 08/20/18 2013 08/21/18 0458 08/21/18 0709  TROPONINIHS 24* 21* 19*     Cardiac EnzymesNo results for input(s): TROPONINI in the last 168 hours. No results for input(s): TROPIPOC in the last 168 hours.  Chemistry Recent Labs  Lab 08/16/18 1113 08/20/18 2013  NA 140 137  K 3.4* 3.0*  CL 98 93*  CO2 29 30  GLUCOSE 116* 113*  BUN 19 14  CREATININE 1.33* 1.25*  CALCIUM 10.1 10.6*  GFRNONAA 54* 58*  GFRAA >60 >60  ANIONGAP 13 14    Recent Labs  Lab 08/16/18 1113  PROT 7.8  ALBUMIN 4.4  AST 47*  ALT 87*   ALKPHOS 76  BILITOT 1.0   Hematology Recent Labs  Lab 08/16/18 1113 08/20/18 2013  WBC 11.4* 13.9*  RBC 5.31 5.43  HGB 15.9 16.2  HCT 47.1 47.7  MCV 88.7 87.8  MCH 29.9 29.8  MCHC 33.8 34.0  RDW 12.1 11.9  PLT 276 269   BNPNo results for input(s): BNP, PROBNP in the last 168 hours.  DDimer No results for input(s): DDIMER in the last 168 hours.   Radiology/Studies:  Dg Chest 2 View  Result Date: 08/20/2018 CLINICAL DATA:  Chest pain EXAM: CHEST - 2 VIEW COMPARISON:  04/05/2018 FINDINGS: Hyperinflation without focal opacity or pleural effusion. Normal cardiomediastinal silhouette. No pneumothorax. Chronic compression fractures of the mid to upper thoracic spine. IMPRESSION: No active cardiopulmonary disease. Electronically Signed   By: Donavan Foil M.D.   On: 08/20/2018 23:39    Assessment and Plan:   Chest pain: symptoms, location, related factors not suggestive of ACS. Flat hsTn, low level. Not consistent with ACS. -based on risk stratification, no further cardiac workup indicated at this time. -do not need to continue trending troponins -management of CV risk largely by BP control, below  Hypertension: currently at goal -tolerates chlorthalidone, however he was hypokalemic on presentation -would consider changing chlorthalidone to amlodipine to maintain long acting effect with less effect on kidneys/potassium -start on metoprolol this admission, but his rhythm is sinus tachycardia. Do not need to treat tachycardia directly  Risk for long QT: monitor based on his multiple psychiatric medications. Another reason to avoid hypokalemia  [facility] will sign off.   Medication Recommendations:  As above. Recommend changing chlorthalidone to amlodipine, does not need to continue metoprolol Other recommendations (labs, testing, etc):  None from cardiology perspective Follow up as an outpatient:  With primary care  For questions or updates, please contact [facility]  Please consult [email or web address] for contact info under   Signed, Buford Dresser, MD  08/21/2018 11:30 AM

## 2018-08-21 NOTE — H&P (Signed)
Carlos Nelson is an 68 y.o. male.   Chief Complaint: Chest pain HPI: The patient with past medical history of hypertension, COPD and schizophrenia presents to the emergency department from his group home reportedly due to chest pain.  The patient has not shared this with this examiner and he is a poor historian due to hallucinations and agitation.  Initial troponin in the emergency department was slightly above normal threshold.  The patient did not have EKG changes but due to his inability to elaborate on symptoms and his persistent tachycardia the emergency department staff call hospitalist service for further evaluation.  Past Medical History:  Diagnosis Date  . COPD (chronic obstructive pulmonary disease) ([place])   . Hypertension   . Schizophrenia [facility])     Past Surgical History:  Procedure Laterality Date  . COLONOSCOPY    . COLONOSCOPY WITH PROPOFOL N/A 12/13/2016   Procedure: COLONOSCOPY WITH PROPOFOL;  Surgeon: Lollie Sails, MD;  Location: [facility] ENDOSCOPY;  Service: Endoscopy;  Laterality: N/A;  . ESOPHAGOGASTRODUODENOSCOPY (EGD) WITH PROPOFOL N/A 12/13/2016   Procedure: ESOPHAGOGASTRODUODENOSCOPY (EGD) WITH PROPOFOL;  Surgeon: Lollie Sails, MD;  Location: [facility] ENDOSCOPY;  Service: Endoscopy;  Laterality: N/A;  . left arm surgery      Family History  Problem Relation Age of Onset  . Prostate cancer Neg Hx   . Bladder Cancer Neg Hx    Social History:  reports that he has quit smoking. His smoking use included cigarettes. He smoked 1.00 pack per day. He has never used smokeless tobacco. He reports current alcohol use. He reports current drug use. Drug: Marijuana.  Allergies:  Allergies  Allergen Reactions  . Ace Inhibitors Swelling    Medications Prior to Admission  Medication Sig Dispense Refill  . albuterol (PROVENTIL HFA;VENTOLIN HFA) 108 (90 Base) MCG/ACT inhaler Inhale 1 puff into the lungs every 6 (six) hours as needed for wheezing or shortness of breath.     . chlorthalidone (HYGROTON) 25 MG tablet Take 12.5 mg by mouth daily.    . QUEtiapine (SEROQUEL XR) 400 MG 24 hr tablet Take 400 mg by mouth at bedtime.    . traZODone (DESYREL) 50 MG tablet Take 50 mg by mouth at bedtime.    . trihexyphenidyl (ARTANE) 5 MG tablet Take 5 mg by mouth 3 (three) times daily with meals.    Marland Kitchen umeclidinium-vilanterol (ANORO ELLIPTA) 62.5-25 MCG/INH AEPB Inhale 1 puff into the lungs daily.    . divalproex (DEPAKOTE ER) 250 MG 24 hr tablet Take 1 tablet (250 mg total) by mouth 2 (two) times daily. 60 tablet 1  . LORazepam (ATIVAN) 1 MG tablet Take 1 tablet (1 mg total) by mouth every 6 (six) hours as needed for anxiety. 30 tablet 0  . QUEtiapine (SEROQUEL) 25 MG tablet Take 0.5 tablets (12.5 mg total) by mouth 2 (two) times daily. 60 tablet 1    Results for orders placed or performed during the hospital encounter of 08/20/18 (from the past 48 hour(s))  CBC with Differential     Status: Abnormal   Collection Time: 08/20/18  8:13 PM  Result Value Ref Range   WBC 13.9 (H) 4.0 - 10.5 K/uL   RBC 5.43 4.22 - 5.81 MIL/uL   Hemoglobin 16.2 13.0 - 17.0 g/dL   HCT 47.7 39.0 - 52.0 %   MCV 87.8 80.0 - 100.0 fL   MCH 29.8 26.0 - 34.0 pg   MCHC 34.0 30.0 - 36.0 g/dL   RDW 11.9 11.5 - 15.5 %  Platelets 269 150 - 400 K/uL   nRBC 0.0 0.0 - 0.2 %   Neutrophils Relative % 69 %   Neutro Abs 9.4 (H) 1.7 - 7.7 K/uL   Lymphocytes Relative 21 %   Lymphs Abs 3.0 0.7 - 4.0 K/uL   Monocytes Relative 10 %   Monocytes Absolute 1.4 (H) 0.1 - 1.0 K/uL   Eosinophils Relative 0 %   Eosinophils Absolute 0.1 0.0 - 0.5 K/uL   Basophils Relative 0 %   Basophils Absolute 0.0 0.0 - 0.1 K/uL   Immature Granulocytes 0 %   Abs Immature Granulocytes 0.06 0.00 - 0.07 K/uL    Comment: Performed at [facility], [address] [place], [place] [address]  Basic metabolic panel     Status: Abnormal   Collection Time: 08/20/18  8:13 PM  Result Value Ref Range   Sodium 137 135 - 145  mmol/L   Potassium 3.0 (L) 3.5 - 5.1 mmol/L   Chloride 93 (L) 98 - 111 mmol/L   CO2 30 22 - 32 mmol/L   Glucose, Bld 113 (H) 70 - 99 mg/dL   BUN 14 8 - 23 mg/dL   Creatinine, Ser 1.25 (H) 0.61 - 1.24 mg/dL   Calcium 10.6 (H) 8.9 - 10.3 mg/dL   GFR calc non Af Amer 58 (L) >60 mL/min   GFR calc Af Amer >60 >60 mL/min   Anion gap 14 5 - 15    Comment: Performed at [facility], [place], [place] [address]  Troponin I (High Sensitivity)     Status: Abnormal   Collection Time: 08/20/18  8:13 PM  Result Value Ref Range   Troponin I (High Sensitivity) 24 (H) <18 ng/L    Comment: (NOTE) Elevated high sensitivity troponin I (hsTnI) values and significant  changes across serial measurements may suggest ACS but many other  chronic and acute conditions are known to elevate hsTnI results.  Refer to the "Links" section for chest pain algorithms and additional  guidance. Performed at [facility], [address] [place], [place] [address]   Valproic acid level     Status: Abnormal   Collection Time: 08/20/18  8:13 PM  Result Value Ref Range   Valproic Acid Lvl <10 (L) 50.0 - 100.0 ug/mL    Comment: Performed at [facility], [address] [place], [place] [address]  TSH     Status: None   Collection Time: 08/20/18  8:13 PM  Result Value Ref Range   TSH 1.353 0.350 - 4.500 uIU/mL    Comment: Performed by a 3rd Generation assay with a functional sensitivity of <=0.01 uIU/mL. Performed at [facility], [address] [place], [place] [address]   T4, free     Status: None   Collection Time: 08/20/18  8:13 PM  Result Value Ref Range   Free T4 0.98 0.61 - 1.12 ng/dL    Comment: (NOTE) Biotin ingestion may interfere with free T4 tests. If the results are inconsistent with the TSH level, previous test results, or the clinical presentation, then consider biotin interference. If needed, order repeat testing after stopping  biotin. Performed at [facility], [address], [place],  [address]   Urine Drug Screen, Qualitative     Status: Abnormal   Collection Time: 08/20/18  8:13 PM  Result Value Ref Range   Tricyclic, Ur Screen POSITIVE (A) NONE DETECTED   Amphetamines, Ur Screen NONE DETECTED NONE DETECTED   MDMA (  Ecstasy)Ur Screen NONE DETECTED NONE DETECTED   Cocaine Metabolite,Ur [place] NONE DETECTED NONE DETECTED   Opiate, Ur Screen NONE DETECTED NONE DETECTED   Phencyclidine (PCP) Ur S NONE DETECTED NONE DETECTED   Cannabinoid 50 Ng, Ur [place] NONE DETECTED NONE DETECTED   Barbiturates, Ur Screen NONE DETECTED NONE DETECTED   Benzodiazepine, Ur Scrn NONE DETECTED NONE DETECTED   Methadone Scn, Ur NONE DETECTED NONE DETECTED    Comment: (NOTE) Tricyclics + metabolites, urine    Cutoff 1000 ng/mL Amphetamines + metabolites, urine  Cutoff 1000 ng/mL MDMA (Ecstasy), urine              Cutoff 500 ng/mL Cocaine Metabolite, urine          Cutoff 300 ng/mL Opiate + metabolites, urine        Cutoff 300 ng/mL Phencyclidine (PCP), urine         Cutoff 25 ng/mL Cannabinoid, urine                 Cutoff 50 ng/mL Barbiturates + metabolites, urine  Cutoff 200 ng/mL Benzodiazepine, urine              Cutoff 200 ng/mL Methadone, urine                   Cutoff 300 ng/mL The urine drug screen provides only a preliminary, unconfirmed analytical test result and should not be used for non-medical purposes. Clinical consideration and professional judgment should be applied to any positive drug screen result due to possible interfering substances. A more specific alternate chemical method must be used in order to obtain a confirmed analytical result. Gas chromatography / mass spectrometry (GC/MS) is the preferred confirmat ory method. Performed at [facility], [place], [place] [address]   Troponin I (High Sensitivity)     Status: Abnormal   Collection Time: 08/21/18  4:58 AM   Result Value Ref Range   Troponin I (High Sensitivity) 21 (H) <18 ng/L    Comment: (NOTE) Elevated high sensitivity troponin I (hsTnI) values and significant  changes across serial measurements may suggest ACS but many other  chronic and acute conditions are known to elevate hsTnI results.  Refer to the "Links" section for chest pain algorithms and additional  guidance. Performed at [facility], [address] [place], [place] [address]    Dg Chest 2 View  Result Date: 08/20/2018 CLINICAL DATA:  Chest pain EXAM: CHEST - 2 VIEW COMPARISON:  04/05/2018 FINDINGS: Hyperinflation without focal opacity or pleural effusion. Normal cardiomediastinal silhouette. No pneumothorax. Chronic compression fractures of the mid to upper thoracic spine. IMPRESSION: No active cardiopulmonary disease. Electronically Signed   By: Donavan Foil M.D.   On: 08/20/2018 23:39    Review of Systems  Constitutional: Negative for chills and fever.  HENT: Negative for sore throat and tinnitus.   Eyes: Negative for blurred vision and redness.  Respiratory: Negative for cough and shortness of breath.   Cardiovascular: Negative for chest pain, palpitations, orthopnea and PND.  Gastrointestinal: Negative for abdominal pain, diarrhea, nausea and vomiting.  Genitourinary: Negative for dysuria, frequency and urgency.  Musculoskeletal: Negative for joint pain and myalgias.  Skin: Negative for rash.       No lesions  Neurological: Negative for speech change, focal weakness and weakness.  Endo/Heme/Allergies: Does not bruise/bleed easily.       No temperature intolerance  Psychiatric/Behavioral: Negative for depression and suicidal ideas.    Blood pressure 103/87, pulse  85, temperature 98.6 F (37 C), temperature source Oral, resp. rate 18, height 5\' 4"  (1.626 m), weight 53.1 kg, SpO2 99 %. Physical Exam  Vitals reviewed. Constitutional: He is oriented to person, place, and time. He appears well-developed  and well-nourished. No distress.  HENT:  Head: Normocephalic and atraumatic.  Mouth/Throat: Oropharynx is clear and moist.  Eyes: Pupils are equal, round, and reactive to light. Conjunctivae and EOM are normal. No scleral icterus.  Neck: Normal range of motion. Neck supple. No JVD present. No tracheal deviation present. No thyromegaly present.  Cardiovascular: Normal rate, regular rhythm and normal heart sounds. Exam reveals no gallop and no friction rub.  No murmur heard. Respiratory: Effort normal and breath sounds normal. No respiratory distress.  GI: Soft. Bowel sounds are normal. He exhibits no distension. There is no abdominal tenderness.  Genitourinary:    Genitourinary Comments: Deferred   Musculoskeletal: Normal range of motion.        General: No edema.  Lymphadenopathy:    He has no cervical adenopathy.  Neurological: He is alert and oriented to person, place, and time. No cranial nerve deficit.  Skin: Skin is warm and dry. No rash noted. No erythema.  Psychiatric: He has a normal mood and affect. His behavior is normal. Judgment and thought content normal.     Assessment/Plan This is a 68 year old male admitted for tachycardia. 1.  Tachycardia: Sinus; persistent.  Continue to monitor telemetry.  Continue metoprolol.  Tachycardia may be secondary to multiple psychiatric medications, or conversely lack of said medications that may contribute to his agitation.  Consult cardiology for evaluation of elevated troponins 2.  Hypertension: Intermittently controlled.  Elevated when patient is more agitated.  Continue chlorthalidone.  Labetalol as needed. 3.  COPD: Stable; continue Anoro Ellipta and albuterol as needed. 4.  CKD: Stage III; avoid nephrotoxic agents. 5.  Schizophrenia: Continue trazodone, Seroquel, trihexyphenidyl and Depakote. 6.  DVT prophylaxis: Lovenox 7.  GI prophylaxis: None The patient is a full code.  Time spent on admission orders and patient care approximately  45 minutes   Harrie Foreman, MD 08/21/2018, 6:14 AM

## 2018-08-21 NOTE — ED Notes (Addendum)
ED TO INPATIENT HANDOFF REPORT  ED Nurse Name and Phone #:   Gershon Mussel RN    [phone]  S Name/Age/Gender Carlos Nelson 68 y.o. male Room/Bed: ED16A/ED16A  Code Status   Code Status: Not on file  Home/SNF/Other Group Home Patient oriented to: self, place, time and situation Is this baseline? Yes   Triage Complete: Triage complete  Chief Complaint Headache  Triage Note Pt arrives to ED from [facility] via [facility] with c/c of chest pain and medication non compliance. Pt with Hx of schizophrenia. EMS reports Sinus tachycardia during transport. Upon arrival pt A&Ox4, NAD, no respiratory Sx evident.     Allergies Allergies  Allergen Reactions  . Ace Inhibitors Swelling    Level of Care/Admitting Diagnosis ED Disposition    ED Disposition Condition [facility] Area: [place] [[id number]]  Level of Care: Telemetry [5]  Covid Evaluation: Confirmed COVID Negative  Diagnosis: Tachycardia [833825]  Admitting Physician: Harrie Foreman [[id number]]  Attending Physician: Harrie Foreman [phone]  PT Class (Do Not Modify): Observation [104]  PT Acc Code (Do Not Modify): Observation [10022]       B Medical/Surgery History Past Medical History:  Diagnosis Date  . COPD (chronic obstructive pulmonary disease) ([place])   . Hypertension   . Schizophrenia [facility])    Past Surgical History:  Procedure Laterality Date  . COLONOSCOPY    . COLONOSCOPY WITH PROPOFOL N/A 12/13/2016   Procedure: COLONOSCOPY WITH PROPOFOL;  Surgeon: Lollie Sails, MD;  Location: [facility] ENDOSCOPY;  Service: Endoscopy;  Laterality: N/A;  . ESOPHAGOGASTRODUODENOSCOPY (EGD) WITH PROPOFOL N/A 12/13/2016   Procedure: ESOPHAGOGASTRODUODENOSCOPY (EGD) WITH PROPOFOL;  Surgeon: Lollie Sails, MD;  Location: [facility] ENDOSCOPY;  Service: Endoscopy;  Laterality: N/A;  . left arm surgery       A IV Location/Drains/Wounds Patient Lines/Drains/Airways Status    Active Line/Drains/Airways    Name:   Placement date:   Placement time:   Site:   Days:   Peripheral IV 08/16/18 Right Forearm   08/16/18    1136    Forearm   5   Peripheral IV 08/20/18 Right Antecubital   08/20/18    2034    Antecubital   1          Intake/Output Last 24 hours  Intake/Output Summary (Last 24 hours) at 08/21/2018 0357 Last data filed at 08/21/2018 0117 Gross per 24 hour  Intake 1000 ml  Output -  Net 1000 ml    Labs/Imaging Results for orders placed or performed during the hospital encounter of 08/20/18 (from the past 48 hour(s))  CBC with Differential     Status: Abnormal   Collection Time: 08/20/18  8:13 PM  Result Value Ref Range   WBC 13.9 (H) 4.0 - 10.5 K/uL   RBC 5.43 4.22 - 5.81 MIL/uL   Hemoglobin 16.2 13.0 - 17.0 g/dL   HCT 47.7 39.0 - 52.0 %   MCV 87.8 80.0 - 100.0 fL   MCH 29.8 26.0 - 34.0 pg   MCHC 34.0 30.0 - 36.0 g/dL   RDW 11.9 11.5 - 15.5 %   Platelets 269 150 - 400 K/uL   nRBC 0.0 0.0 - 0.2 %   Neutrophils Relative % 69 %   Neutro Abs 9.4 (H) 1.7 - 7.7 K/uL   Lymphocytes Relative 21 %   Lymphs Abs 3.0 0.7 - 4.0 K/uL   Monocytes Relative 10 %   Monocytes Absolute 1.4 (  H) 0.1 - 1.0 K/uL   Eosinophils Relative 0 %   Eosinophils Absolute 0.1 0.0 - 0.5 K/uL   Basophils Relative 0 %   Basophils Absolute 0.0 0.0 - 0.1 K/uL   Immature Granulocytes 0 %   Abs Immature Granulocytes 0.06 0.00 - 0.07 K/uL    Comment: Performed at [facility], [address] [place], [place] [address]  Basic metabolic panel     Status: Abnormal   Collection Time: 08/20/18  8:13 PM  Result Value Ref Range   Sodium 137 135 - 145 mmol/L   Potassium 3.0 (L) 3.5 - 5.1 mmol/L   Chloride 93 (L) 98 - 111 mmol/L   CO2 30 22 - 32 mmol/L   Glucose, Bld 113 (H) 70 - 99 mg/dL   BUN 14 8 - 23 mg/dL   Creatinine, Ser 1.25 (H) 0.61 - 1.24 mg/dL   Calcium 10.6 (H) 8.9 - 10.3 mg/dL   GFR calc non Af Amer 58 (L) >60 mL/min   GFR calc Af Amer >60 >60 mL/min    Anion gap 14 5 - 15    Comment: Performed at [facility], [place], [place] [address]  Troponin I (High Sensitivity)     Status: Abnormal   Collection Time: 08/20/18  8:13 PM  Result Value Ref Range   Troponin I (High Sensitivity) 24 (H) <18 ng/L    Comment: (NOTE) Elevated high sensitivity troponin I (hsTnI) values and significant  changes across serial measurements may suggest ACS but many other  chronic and acute conditions are known to elevate hsTnI results.  Refer to the "Links" section for chest pain algorithms and additional  guidance. Performed at [facility], [address] [place], [place] [address]   Valproic acid level     Status: Abnormal   Collection Time: 08/20/18  8:13 PM  Result Value Ref Range   Valproic Acid Lvl <10 (L) 50.0 - 100.0 ug/mL    Comment: Performed at [facility], [address] [place], [place] [address]  TSH     Status: None   Collection Time: 08/20/18  8:13 PM  Result Value Ref Range   TSH 1.353 0.350 - 4.500 uIU/mL    Comment: Performed by a 3rd Generation assay with a functional sensitivity of <=0.01 uIU/mL. Performed at [facility], [address] [place], [place] [address]   T4, free     Status: None   Collection Time: 08/20/18  8:13 PM  Result Value Ref Range   Free T4 0.98 0.61 - 1.12 ng/dL    Comment: (NOTE) Biotin ingestion may interfere with free T4 tests. If the results are inconsistent with the TSH level, previous test results, or the clinical presentation, then consider biotin interference. If needed, order repeat testing after stopping biotin. Performed at [facility], [address] [place],  [address]   Urine Drug Screen, Qualitative     Status: Abnormal   Collection Time: 08/20/18  8:13 PM  Result Value Ref Range   Tricyclic, Ur Screen POSITIVE (A) NONE DETECTED   Amphetamines, Ur Screen NONE DETECTED NONE DETECTED   MDMA (Ecstasy)Ur Screen  NONE DETECTED NONE DETECTED   Cocaine Metabolite,Ur [place] NONE DETECTED NONE DETECTED   Opiate, Ur Screen NONE DETECTED NONE DETECTED   Phencyclidine (PCP) Ur S NONE DETECTED NONE DETECTED   Cannabinoid 50 Ng, Ur [place] NONE DETECTED NONE DETECTED   Barbiturates, Ur Screen NONE DETECTED NONE DETECTED   Benzodiazepine,  Ur Scrn NONE DETECTED NONE DETECTED   Methadone Scn, Ur NONE DETECTED NONE DETECTED    Comment: (NOTE) Tricyclics + metabolites, urine    Cutoff 1000 ng/mL Amphetamines + metabolites, urine  Cutoff 1000 ng/mL MDMA (Ecstasy), urine              Cutoff 500 ng/mL Cocaine Metabolite, urine          Cutoff 300 ng/mL Opiate + metabolites, urine        Cutoff 300 ng/mL Phencyclidine (PCP), urine         Cutoff 25 ng/mL Cannabinoid, urine                 Cutoff 50 ng/mL Barbiturates + metabolites, urine  Cutoff 200 ng/mL Benzodiazepine, urine              Cutoff 200 ng/mL Methadone, urine                   Cutoff 300 ng/mL The urine drug screen provides only a preliminary, unconfirmed analytical test result and should not be used for non-medical purposes. Clinical consideration and professional judgment should be applied to any positive drug screen result due to possible interfering substances. A more specific alternate chemical method must be used in order to obtain a confirmed analytical result. Gas chromatography / mass spectrometry (GC/MS) is the preferred confirmat ory method. Performed at [facility], [address] [place], [place] [address]    Dg Chest 2 View  Result Date: 08/20/2018 CLINICAL DATA:  Chest pain EXAM: CHEST - 2 VIEW COMPARISON:  04/05/2018 FINDINGS: Hyperinflation without focal opacity or pleural effusion. Normal cardiomediastinal silhouette. No pneumothorax. Chronic compression fractures of the mid to upper thoracic spine. IMPRESSION: No active cardiopulmonary disease. Electronically Signed   By: Donavan Foil M.D.   On: 08/20/2018 23:39     Pending Labs Unresulted Labs (From admission, onward)    Start     Ordered   Signed and Held  Creatinine, serum  (enoxaparin (LOVENOX)    CrCl >/= 30 ml/min)  Weekly,   R    Comments: while on enoxaparin therapy    Signed and Held          Vitals/Pain Today's Vitals   08/20/18 2230 08/21/18 0116 08/21/18 0130 08/21/18 0200  BP: (!) 154/107 109/73 105/80 129/90  Pulse: (!) 121 94 88   Resp: (!) 33 (!) 24    Temp:      TempSrc:      SpO2: 92% 96% 92%   Weight:      Height:      PainSc:  4       Isolation Precautions No active isolations  Medications Medications  divalproex (DEPAKOTE ER) 24 hr tablet 250 mg (has no administration in time range)  metoprolol tartrate (LOPRESSOR) tablet 50 mg (50 mg Oral Given 08/20/18 2149)  traZODone (DESYREL) tablet 50 mg (50 mg Oral Given 08/20/18 2329)  QUEtiapine (SEROQUEL XR) 24 hr tablet 400 mg (400 mg Oral Given 08/20/18 2350)  lactated ringers bolus 1,000 mL (0 mLs Intravenous Stopped 08/21/18 0117)  potassium chloride SA (K-DUR) CR tablet 40 mEq (40 mEq Oral Given 08/20/18 2352)    Mobility walks Low fall risk   Focused Assessments Cardiac Assessment Handoff:  Cardiac Rhythm: Sinus tachycardia No results found for: CKTOTAL, CKMB, CKMBINDEX, TROPONINI No results found for: DDIMER Does the Patient currently have chest pain? No   , Pulmonary Assessment Handoff:  Lung sounds:   O2  Device: Room Air     Pt is technically A&Ox4 but is often confuse or unintelligible when asked about lifestyle or medical history questions.    R Recommendations: See Admitting Provider Note  Report given to: Sharyn Lull RN on 2A  Additional Notes:

## 2018-08-22 DIAGNOSIS — F209 Schizophrenia, unspecified: Secondary | ICD-10-CM

## 2018-08-22 DIAGNOSIS — R4689 Other symptoms and signs involving appearance and behavior: Secondary | ICD-10-CM | POA: Diagnosis not present

## 2018-08-22 DIAGNOSIS — R443 Hallucinations, unspecified: Secondary | ICD-10-CM | POA: Diagnosis not present

## 2018-08-22 LAB — BASIC METABOLIC PANEL
Anion gap: 9 (ref 5–15)
BUN: 14 mg/dL (ref 8–23)
CO2: 31 mmol/L (ref 22–32)
Calcium: 9.8 mg/dL (ref 8.9–10.3)
Chloride: 99 mmol/L (ref 98–111)
Creatinine, Ser: 1.03 mg/dL (ref 0.61–1.24)
GFR calc Af Amer: >60 mL/min (ref >60)
GFR calc non Af Amer: >60 mL/min (ref >60)
Glucose, Bld: 89 mg/dL (ref 70–99)
Potassium: 3.4 mmol/L — ABNORMAL LOW (ref 3.5–5.1)
Sodium: 139 mmol/L (ref 135–145)

## 2018-08-22 LAB — ALBUMIN: Albumin: 3.8 g/dL (ref 3.5–5.0)

## 2018-08-22 LAB — MAGNESIUM: Magnesium: 1.8 mg/dL (ref 1.7–2.4)

## 2018-08-22 LAB — PHOSPHORUS: Phosphorus: 3.4 mg/dL (ref 2.5–4.6)

## 2018-08-22 MED ORDER — AMLODIPINE BESYLATE 5 MG PO TABS: 2.5000 mg | ORAL_TABLET | Freq: Every day | ORAL | Status: DC

## 2018-08-22 MED ORDER — MAGNESIUM SULFATE 2 GM/50ML IV SOLN
2.0000 g | Freq: Once | INTRAVENOUS | Status: AC
  Administered 2018-08-22: 2 g via INTRAVENOUS
  Filled 2018-08-22: qty 50

## 2018-08-22 MED ORDER — METOPROLOL TARTRATE 50 MG PO TABS: 50.0000 mg | ORAL_TABLET | Freq: Two times a day (BID) | ORAL | 0 refills | Status: DC

## 2018-08-22 MED ORDER — HALOPERIDOL 2 MG PO TABS: 2.5000 mg | ORAL_TABLET | Freq: Every day | ORAL | Status: DC

## 2018-08-22 MED ORDER — HALOPERIDOL 0.5 MG PO TABS: 2.5000 mg | ORAL_TABLET | Freq: Every day | ORAL | 0 refills | Status: DC

## 2018-08-22 MED ORDER — HALOPERIDOL 2 MG PO TABS
2.0000 mg | ORAL_TABLET | Freq: Every day | ORAL | Status: DC
  Filled 2018-08-22: qty 1

## 2018-08-22 MED ORDER — LORAZEPAM 1 MG PO TABS: 1.0000 mg | ORAL_TABLET | Freq: Four times a day (QID) | ORAL | 0 refills | Status: DC | PRN

## 2018-08-22 MED ORDER — POTASSIUM CHLORIDE CRYS ER 20 MEQ PO TBCR
20.0000 meq | EXTENDED_RELEASE_TABLET | Freq: Once | ORAL | Status: AC
  Administered 2018-08-22: 20 meq via ORAL
  Filled 2018-08-22: qty 1

## 2018-08-22 MED ORDER — AMLODIPINE BESYLATE 2.5 MG PO TABS: 2.5000 mg | ORAL_TABLET | Freq: Every day | ORAL | 0 refills | Status: DC

## 2018-08-22 NOTE — TOC Transition Note (Signed)
Transition of Care [facility]) - CM/SW Discharge Note   Patient Details  Name: Carlos Nelson MRN: [id number] Date of Birth: [birth year]  Transition of Care [facility]) CM/SW Contact:  Katrina Stack, RN Phone Number: 08/22/2018, 3:49 PM   Clinical Narrative:   CM spoke with Cleora Fleet - administrator of [facility].  Much trouble making contact as the numbers were incorrect in Epic. This CM updated care home number.  Patient to discharge back to care home.  Pam is agreeable to have home health nurse.  No agency preference.  Referral to called to and accepted by [facility].  Pam will transport patient. FL2 placed in the discharge folder. Pam declines having it faxed, says she will pick it up at discharge.  Hard scripts for the ativan and haldol are in the folder    Final next level of care: [place] Barriers to Discharge: No Barriers Identified   Patient Goals and CMS Choice Patient states their goals for this hospitalization and ongoing recovery are:: To return back home CMS Medicare.gov Compare Post Acute Care list provided to:: Other (Comment Required)(care home administrator and placed in medical record) Choice offered to / list presented to : (Care Home Administrator)  Discharge Placement                       Discharge Plan and Services In-house Referral: Clinical Social Work                        HH Arranged: RN [place] Agency: [place] Date [place]: 08/22/18   Representative spoke with at [place]: cheryl  Social Determinants of Health ([place]) Interventions     Readmission Risk Interventions No flowsheet data found.

## 2018-08-22 NOTE — Discharge Summary (Signed)
[place] [facility] [place] NAME: Carlos Nelson    MR#:  [id number]  DATE OF BIRTH:  1950-05-21  DATE OF ADMISSION:  08/20/2018 ADMITTING PHYSICIAN: Harrie Foreman, MD  DATE OF DISCHARGE:  08/22/18   PRIMARY CARE PHYSICIAN: Jodi Marble, MD    ADMISSION DIAGNOSIS:  Hypokalemia [E87.6] Tachycardia [R00.0] Disorganized behavior [R46.89] Schizophrenia, unspecified type ([place]) [F20.9] Chest pain, unspecified type [R07.9]  DISCHARGE DIAGNOSIS:  Active Problems:   Tachycardia   Schizophrenia ([place])  Chest pain SECONDARY DIAGNOSIS:   Past Medical History:  Diagnosis Date  . COPD (chronic obstructive pulmonary disease) ([place])   . Hypertension   . Schizophrenia [facility])     HOSPITAL COURSE:  HPI: The patient with past medical history of hypertension, COPD and schizophrenia presents to the emergency department from his group home reportedly due to chest pain.  The patient has not shared this with this examiner and he is a poor historian due to hallucinations and agitation.  Initial troponin in the emergency department was slightly above normal threshold.  The patient did not have EKG changes but due to his inability to elaborate on symptoms and his persistent tachycardia the emergency department staff call hospitalist service for further evaluation.  1.   Chest pain with tachycardia: Sinus; persistent.  Probably demand ischemia Troponins are non-trending continue to monitor telemetry. Continue metoprolol. Tachycardia may be secondary to multiple psychiatric medications, or conversely lack of said medications that may contribute to his agitation.   Cardiology is seen and not recommending any cardiac interventions at this time No chest pain or shortness of breath today clinically stable  2. Hypertension: Intermittently controlled. Elevated when patient is more agitated.   Cardiology is recommending to change chlorthalidone to  amlodipine as the patient was hypokalemic at the time of presentation.  Metoprolol   3. COPD: Stable; continue Anoro Ellipta and albuterol as needed.  4. CKD: Stage III; avoid nephrotoxic agents.  5. Schizophrenia: Continue trazodone, , trihexyphenidyl and Depakote, Ativan 1 mg every 6 hours as needed for anxiety and agitation Seen by psychiatry-recommended to discontinue Seroquel.  Start Haldol 2.5 mg once daily and plan is to start long-term antipsychotic injectable of Haldol decanoate 25 mg with his outpatient provider.  6.  Hypokalemia chlorthalidone discontinued potassium supplements given  DVT prophylaxis Lovenox subcu during the hospital course  Discharge patient to group home with home health DISCHARGE CONDITIONS:   Fair  CONSULTS OBTAINED:  Treatment Team:  Money, Lowry Ram, FNP   PROCEDURES none  DRUG ALLERGIES:   Allergies  Allergen Reactions  . Ace Inhibitors Swelling    DISCHARGE MEDICATIONS:   Allergies as of 08/22/2018      Reactions   Ace Inhibitors Swelling      Medication List    STOP taking these medications   chlorthalidone 25 MG tablet Commonly known as: HYGROTON     TAKE these medications   albuterol 108 (90 Base) MCG/ACT inhaler Commonly known as: VENTOLIN HFA Inhale 1 puff into the lungs every 6 (six) hours as needed for wheezing or shortness of breath.   amLODipine 2.5 MG tablet Commonly known as: NORVASC Take 1 tablet (2.5 mg total) by mouth daily.   divalproex 250 MG 24 hr tablet Commonly known as: DEPAKOTE ER Take 1 tablet (250 mg total) by mouth 2 (two) times daily.   haloperidol 0.5 MG tablet Commonly known as: HALDOL Take 5 tablets (2.5 mg total) by mouth daily. Start taking on:  August 23, 2018   LORazepam 1 MG tablet Commonly known as: ATIVAN Take 1 tablet (1 mg total) by mouth every 6 (six) hours as needed for anxiety.   metoprolol tartrate 50 MG tablet Commonly known as: LOPRESSOR Take 1 tablet (50 mg total) by  mouth 2 (two) times daily.   QUEtiapine 400 MG 24 hr tablet Commonly known as: SEROQUEL XR Take 400 mg by mouth at bedtime. What changed: Another medication with the same name was removed. Continue taking this medication, and follow the directions you see here.   traZODone 50 MG tablet Commonly known as: DESYREL Take 50 mg by mouth at bedtime.   trihexyphenidyl 5 MG tablet Commonly known as: ARTANE Take 5 mg by mouth 3 (three) times daily with meals.   umeclidinium-vilanterol 62.5-25 MCG/INH Aepb Commonly known as: ANORO ELLIPTA Inhale 1 puff into the lungs daily.        DISCHARGE INSTRUCTIONS:   Follow-up with primary care physician in 3 days Outpatient follow-up with psychiatry provider in 5 to 7 days  DIET:  Cardiac diet  DISCHARGE CONDITION:  Fair  ACTIVITY:  Activity as tolerated  OXYGEN:  Home Oxygen: No.   Oxygen Delivery: room air  DISCHARGE LOCATION:  group home   If you experience worsening of your admission symptoms, develop shortness of breath, life threatening emergency, suicidal or homicidal thoughts you must seek medical attention immediately by calling 911 or calling your MD immediately  if symptoms less severe.  You Must read complete instructions/literature along with all the possible adverse reactions/side effects for all the Medicines you take and that have been prescribed to you. Take any new Medicines after you have completely understood and accpet all the possible adverse reactions/side effects.   Please note  You were cared for by a hospitalist during your hospital stay. If you have any questions about your discharge medications or the care you received while you were in the hospital after you are discharged, you can call the unit and asked to speak with the hospitalist on call if the hospitalist that took care of you is not available. Once you are discharged, your primary care physician will handle any further medical issues. Please note  that NO REFILLS for any discharge medications will be authorized once you are discharged, as it is imperative that you return to your primary care physician (or establish a relationship with a primary care physician if you do not have one) for your aftercare needs so that they can reassess your need for medications and monitor your lab values.     Today  Chief Complaint  Patient presents with  . Chest Pain   Patient is resting comfortably denies any chest pain or shortness of breath Not hallucinating anymore and okay to discharge patient from cardiology and psychiatry standpoint to group home  ROS:  CONSTITUTIONAL: Denies fevers, chills. Denies any fatigue, weakness.  EYES: Denies blurry vision, double vision, eye pain. EARS, NOSE, THROAT: Denies tinnitus, ear pain, hearing loss. RESPIRATORY: Denies cough, wheeze, shortness of breath.  CARDIOVASCULAR: Denies chest pain, palpitations, edema.  GASTROINTESTINAL: Denies nausea, vomiting, diarrhea, abdominal pain. Denies bright red blood per rectum. GENITOURINARY: Denies dysuria, hematuria. ENDOCRINE: Denies nocturia or thyroid problems. HEMATOLOGIC AND LYMPHATIC: Denies easy bruising or bleeding. SKIN: Denies rash or lesion. MUSCULOSKELETAL: Denies pain in neck, back, shoulder, knees, hips or arthritic symptoms.  NEUROLOGIC: Denies paralysis, paresthesias.  PSYCHIATRIC: Denies anxiety or depressive symptoms.   VITAL SIGNS:  Blood pressure 106/80, pulse 92, temperature 98.1  F (36.7 C), temperature source Oral, resp. rate 19, height 5\' 4"  (1.626 m), weight 52 kg, SpO2 98 %.  I/O:    Intake/Output Summary (Last 24 hours) at 08/22/2018 1510 Last data filed at 08/22/2018 1300 Gross per 24 hour  Intake 720 ml  Output 2950 ml  Net -2230 ml    PHYSICAL EXAMINATION:  GENERAL:  68 y.o.-year-old patient lying in the bed with no acute distress.  EYES: Pupils equal, round, reactive to light and accommodation. No scleral icterus. Extraocular  muscles intact.  HEENT: Head atraumatic, normocephalic. Oropharynx and nasopharynx clear.  NECK:  Supple, no jugular venous distention. No thyroid enlargement, no tenderness.  LUNGS: Normal breath sounds bilaterally, no wheezing, rales,rhonchi or crepitation. No use of accessory muscles of respiration.  CARDIOVASCULAR: S1, S2 normal. No murmurs, rubs, or gallops.  ABDOMEN: Soft, non-tender, non-distended. Bowel sounds present. No organomegaly or mass.  EXTREMITIES: No pedal edema, cyanosis, or clubbing.  NEUROLOGIC: Cranial nerves II through XII are intact. Muscle strength 5/5 in all extremities. Sensation intact. Gait not checked.  PSYCHIATRIC: The patient is alert and oriented x 3.  SKIN: No obvious rash, lesion, or ulcer.   DATA REVIEW:   CBC Recent Labs  Lab 08/20/18 2013  WBC 13.9*  HGB 16.2  HCT 47.7  PLT 269    Chemistries  Recent Labs  Lab 08/16/18 1113  08/22/18 0450  NA 140   < > 139  K 3.4*   < > 3.4*  CL 98   < > 99  CO2 29   < > 31  GLUCOSE 116*   < > 89  BUN 19   < > 14  CREATININE 1.33*   < > 1.03  CALCIUM 10.1   < > 9.8  MG  --   --  1.8  AST 47*  --   --   ALT 87*  --   --   ALKPHOS 76  --   --   BILITOT 1.0  --   --    < > = values in this interval not displayed.    Cardiac Enzymes No results for input(s): TROPONINI in the last 168 hours.  Microbiology Results  Results for orders placed or performed during the hospital encounter of 08/20/18  MRSA PCR Screening     Status: None   Collection Time: 08/21/18  7:45 PM   Specimen: Nasal Mucosa; Nasopharyngeal  Result Value Ref Range Status   MRSA by PCR NEGATIVE NEGATIVE Final    Comment:        The GeneXpert MRSA Assay (FDA approved for NASAL specimens only), is one component of a comprehensive MRSA colonization surveillance program. It is not intended to diagnose MRSA infection nor to guide or monitor treatment for MRSA infections. Performed at [facility], [address]  [place], [place] [address]     RADIOLOGY:  Dg Chest 2 View  Result Date: 08/20/2018 CLINICAL DATA:  Chest pain EXAM: CHEST - 2 VIEW COMPARISON:  04/05/2018 FINDINGS: Hyperinflation without focal opacity or pleural effusion. Normal cardiomediastinal silhouette. No pneumothorax. Chronic compression fractures of the mid to upper thoracic spine. IMPRESSION: No active cardiopulmonary disease. Electronically Signed   By: Donavan Foil M.D.   On: 08/20/2018 23:39    EKG:   Orders placed or performed during the hospital encounter of 08/20/18  . EKG 12-Lead  . EKG 12-Lead      Management plans discussed with the patient, family and they are in agreement.  CODE  STATUS:     Code Status Orders  (From admission, onward)         Start     Ordered   08/21/18 0447  Full code  Continuous     08/21/18 0446        Code Status History    This patient has a current code status but no historical code status.   Advance Care Planning Activity      TOTAL TIME TAKING CARE OF THIS PATIENT: 45 minutes.   Note: This dictation was prepared with Dragon dictation along with smaller phrase technology. Any transcriptional errors that result from this process are unintentional.   @M[facility] @  on 08/22/2018 at 3:10 PM  Between 7am to 6pm - Pager - [phone]  After 6pm go to [email or web address] - password [email or web address] [facility]  Office  [phone]  CC: Primary care physician; Jodi Marble, MD

## 2018-08-22 NOTE — Consult Note (Signed)
[facility] Face-to-Face Psychiatry Consult   Reason for Consult:  Hallucinations Referring Physician:  Hospitalist Patient Identification: NAYIB REMER MRN:  [id number] Principal Diagnosis: schizophrenia Diagnosis:  Active Problems:   Tachycardia  Total Time spent with patient: 35 minutes  Subjective:   Carlos Nelson is a 68 y.o. male patient reports that he came to the hospital due to abdominal issues. He will not admit to refusing his medications. He denies any suicidal or homicidal ideations and denies any hallucinations currently. He also denied any hallucinations last night or earlier today.  HPI:  Per Dr. Charna Archer: 67 y.o. male with history of schizophrenia, hypertension, COPD who presents to the ED complaining of chest pain.  Patient reports he has burning discomfort along the right side of his chest and upper abdomen which is been constant over the past couple of days.  He states "it feels like I am bleeding in my stomach".  He denies any blood in his stool or any dark tarry stool, has not had any hematemesis.  History is limited as patient frequently speaks incoherently and difficult to redirect at times.  Spoke with patient's caretaker over the phone, who states that patient has been very erratic since stopping taking his medications about 1 week ago.  He was seen in the ED for similar complaints 3 days ago, evaluated by psychiatry and determined to be appropriate for discharge home.  She states that he agreed to take his medicines while in the hospital, but has again stopped taking them since he is returned home.  Earlier this evening, he was threatening towards other residents at the group home and caretaker reports concern for their safety.  08/22/18:  Patient is seen by this provider face-to face.Patient presents lying in bed requesting something to drink, calm and cooperative.  Difficult to understand at time due to missing teeth and slurring of speech.  He denies any suicidal or  homicidal ideations and denies hallucinations today.  Compliant with his medications in the hospital, not at his group home.  Evidently the group home wants a long term injectable started prior to discharge as he refuses medications frequently.  Haldol started today, if tolerated he can start a long term injectable of haldol decanoate.  No suicidal/homicidal ideations or substance abuse.    08/21/18:  Patient is seen by this provider face-to face.Patient presents lying in bed and is calm and cooperative. He denies any suicidal or homicidal ideations and also states that he has been taking his medications at the group home. It has been reported that he was having active hallucinations on the medical floor last night and this morning, but presents with none now. His medications have been restarted and he presents calm. His speech is difficult to understand as it is garbled. The RN reports that he has been calm and sleeping most of the day. She also reports that the group home staff called her and stating that he must be on a long acting injectable before he can return. Unless he changes and meets criteria for psychiatric admission, this should be done by his outpatient psychiatric provider. Patient was seen by this provider 3 days ago with similar presentation and the patient remained at the hospital and was monitored and then cleared to return to the group home. At the current moment he does not present with any imminent threat to himself or to others. At this time he does not meet inpatient criteria and is psychiatrically cleared. However, we will review the patient  tomorrow to assess for progression and stabilization.   Past Psychiatric History: Schizophrenia  Risk to Self:  NO Risk to Others:  NO Prior Inpatient Therapy:   Prior Outpatient Therapy:    Past Medical History:  Past Medical History:  Diagnosis Date  . COPD (chronic obstructive pulmonary disease) ([place])   . Hypertension   . Schizophrenia  [facility])     Past Surgical History:  Procedure Laterality Date  . COLONOSCOPY    . COLONOSCOPY WITH PROPOFOL N/A 12/13/2016   Procedure: COLONOSCOPY WITH PROPOFOL;  Surgeon: Lollie Sails, MD;  Location: [facility] ENDOSCOPY;  Service: Endoscopy;  Laterality: N/A;  . ESOPHAGOGASTRODUODENOSCOPY (EGD) WITH PROPOFOL N/A 12/13/2016   Procedure: ESOPHAGOGASTRODUODENOSCOPY (EGD) WITH PROPOFOL;  Surgeon: Lollie Sails, MD;  Location: [facility] ENDOSCOPY;  Service: Endoscopy;  Laterality: N/A;  . left arm surgery     Family History:  Family History  Problem Relation Age of Onset  . Prostate cancer Neg Hx   . Bladder Cancer Neg Hx    Family Psychiatric  History: None reported Social History:  Social History   Substance and Sexual Activity  Alcohol Use Yes  . Alcohol/week: 0.0 standard drinks   Comment: beer     Social History   Substance and Sexual Activity  Drug Use Yes  . Types: Marijuana   Comment: marjuana    Social History   Socioeconomic History  . Marital status: Single    Spouse name: Not on file  . Number of children: Not on file  . Years of education: Not on file  . Highest education level: Not on file  Occupational History  . Not on file  Social Needs  . Financial resource strain: Not on file  . Food insecurity    Worry: Not on file    Inability: Not on file  . Transportation needs    Medical: Not on file    Non-medical: Not on file  Tobacco Use  . Smoking status: Former Smoker    Packs/day: 1.00    Types: Cigarettes  . Smokeless tobacco: Never Used  Substance and Sexual Activity  . Alcohol use: Yes    Alcohol/week: 0.0 standard drinks    Comment: beer  . Drug use: Yes    Types: Marijuana    Comment: marjuana  . Sexual activity: Not on file  Lifestyle  . Physical activity    Days per week: Not on file    Minutes per session: Not on file  . Stress: Not on file  Relationships  . Social Herbalist on phone: Not on file    Gets together: Not  on file    Attends religious service: Not on file    Active member of club or organization: Not on file    Attends meetings of clubs or organizations: Not on file    Relationship status: Not on file  Other Topics Concern  . Not on file  Social History Narrative  . Not on file   Additional Social History:    Allergies:   Allergies  Allergen Reactions  . Ace Inhibitors Swelling    Labs:  Results for orders placed or performed during the hospital encounter of 08/20/18 (from the past 48 hour(s))  CBC with Differential     Status: Abnormal   Collection Time: 08/20/18  8:13 PM  Result Value Ref Range   WBC 13.9 (H) 4.0 - 10.5 K/uL   RBC 5.43 4.22 - 5.81 MIL/uL   Hemoglobin 16.2 13.0 -  17.0 g/dL   HCT 47.7 39.0 - 52.0 %   MCV 87.8 80.0 - 100.0 fL   MCH 29.8 26.0 - 34.0 pg   MCHC 34.0 30.0 - 36.0 g/dL   RDW 11.9 11.5 - 15.5 %   Platelets 269 150 - 400 K/uL   nRBC 0.0 0.0 - 0.2 %   Neutrophils Relative % 69 %   Neutro Abs 9.4 (H) 1.7 - 7.7 K/uL   Lymphocytes Relative 21 %   Lymphs Abs 3.0 0.7 - 4.0 K/uL   Monocytes Relative 10 %   Monocytes Absolute 1.4 (H) 0.1 - 1.0 K/uL   Eosinophils Relative 0 %   Eosinophils Absolute 0.1 0.0 - 0.5 K/uL   Basophils Relative 0 %   Basophils Absolute 0.0 0.0 - 0.1 K/uL   Immature Granulocytes 0 %   Abs Immature Granulocytes 0.06 0.00 - 0.07 K/uL    Comment: Performed at [facility], [address] [place], [place] [address]  Basic metabolic panel     Status: Abnormal   Collection Time: 08/20/18  8:13 PM  Result Value Ref Range   Sodium 137 135 - 145 mmol/L   Potassium 3.0 (L) 3.5 - 5.1 mmol/L   Chloride 93 (L) 98 - 111 mmol/L   CO2 30 22 - 32 mmol/L   Glucose, Bld 113 (H) 70 - 99 mg/dL   BUN 14 8 - 23 mg/dL   Creatinine, Ser 1.25 (H) 0.61 - 1.24 mg/dL   Calcium 10.6 (H) 8.9 - 10.3 mg/dL   GFR calc non Af Amer 58 (L) >60 mL/min   GFR calc Af Amer >60 >60 mL/min   Anion gap 14 5 - 15    Comment: Performed at [facility], [place], [place] [address]  Troponin I (High Sensitivity)     Status: Abnormal   Collection Time: 08/20/18  8:13 PM  Result Value Ref Range   Troponin I (High Sensitivity) 24 (H) <18 ng/L    Comment: (NOTE) Elevated high sensitivity troponin I (hsTnI) values and significant  changes across serial measurements may suggest ACS but many other  chronic and acute conditions are known to elevate hsTnI results.  Refer to the "Links" section for chest pain algorithms and additional  guidance. Performed at [facility], [address] [place], [place] [address]   Valproic acid level     Status: Abnormal   Collection Time: 08/20/18  8:13 PM  Result Value Ref Range   Valproic Acid Lvl <10 (L) 50.0 - 100.0 ug/mL    Comment: Performed at [facility], [address] [place], [place] [address]  TSH     Status: None   Collection Time: 08/20/18  8:13 PM  Result Value Ref Range   TSH 1.353 0.350 - 4.500 uIU/mL    Comment: Performed by a 3rd Generation assay with a functional sensitivity of <=0.01 uIU/mL. Performed at [facility], [address] [place], [place] [address]   T4, free     Status: None   Collection Time: 08/20/18  8:13 PM  Result Value Ref Range   Free T4 0.98 0.61 - 1.12 ng/dL    Comment: (NOTE) Biotin ingestion may interfere with free T4 tests. If the results are inconsistent with the TSH level, previous test results, or the clinical presentation, then consider biotin interference. If needed, order repeat testing after stopping biotin. Performed at [facility], [address], [place],  [address]   Urine  Drug Screen, Qualitative     Status: Abnormal   Collection Time: 08/20/18  8:13 PM  Result Value Ref Range   Tricyclic, Ur Screen POSITIVE (A) NONE DETECTED   Amphetamines, Ur Screen NONE DETECTED NONE DETECTED   MDMA (Ecstasy)Ur Screen NONE DETECTED NONE DETECTED   Cocaine Metabolite,Ur Why  NONE DETECTED NONE DETECTED   Opiate, Ur Screen NONE DETECTED NONE DETECTED   Phencyclidine (PCP) Ur S NONE DETECTED NONE DETECTED   Cannabinoid 50 Ng, Ur [place] NONE DETECTED NONE DETECTED   Barbiturates, Ur Screen NONE DETECTED NONE DETECTED   Benzodiazepine, Ur Scrn NONE DETECTED NONE DETECTED   Methadone Scn, Ur NONE DETECTED NONE DETECTED    Comment: (NOTE) Tricyclics + metabolites, urine    Cutoff 1000 ng/mL Amphetamines + metabolites, urine  Cutoff 1000 ng/mL MDMA (Ecstasy), urine              Cutoff 500 ng/mL Cocaine Metabolite, urine          Cutoff 300 ng/mL Opiate + metabolites, urine        Cutoff 300 ng/mL Phencyclidine (PCP), urine         Cutoff 25 ng/mL Cannabinoid, urine                 Cutoff 50 ng/mL Barbiturates + metabolites, urine  Cutoff 200 ng/mL Benzodiazepine, urine              Cutoff 200 ng/mL Methadone, urine                   Cutoff 300 ng/mL The urine drug screen provides only a preliminary, unconfirmed analytical test result and should not be used for non-medical purposes. Clinical consideration and professional judgment should be applied to any positive drug screen result due to possible interfering substances. A more specific alternate chemical method must be used in order to obtain a confirmed analytical result. Gas chromatography / mass spectrometry (GC/MS) is the preferred confirmat ory method. Performed at [facility], [place], [place] [address]   Troponin I (High Sensitivity)     Status: Abnormal   Collection Time: 08/21/18  4:58 AM  Result Value Ref Range   Troponin I (High Sensitivity) 21 (H) <18 ng/L    Comment: (NOTE) Elevated high sensitivity troponin I (hsTnI) values and significant  changes across serial measurements may suggest ACS but many other  chronic and acute conditions are known to elevate hsTnI results.  Refer to the "Links" section for chest pain algorithms and additional  guidance. Performed at  [facility], [place], [place] [address]   Troponin I (High Sensitivity)     Status: Abnormal   Collection Time: 08/21/18  7:09 AM  Result Value Ref Range   Troponin I (High Sensitivity) 19 (H) <18 ng/L    Comment: (NOTE) Elevated high sensitivity troponin I (hsTnI) values and significant  changes across serial measurements may suggest ACS but many other  chronic and acute conditions are known to elevate hsTnI results.  Refer to the "Links" section for chest pain algorithms and additional  guidance. Performed at [facility], [address] [place], [place] [address]   Potassium     Status: None   Collection Time: 08/21/18  1:13 PM  Result Value Ref Range   Potassium 3.9 3.5 - 5.1 mmol/L    Comment: Performed at [facility], [address], [place], [place] [address]  MRSA PCR Screening  Status: None   Collection Time: 08/21/18  7:45 PM   Specimen: Nasal Mucosa; Nasopharyngeal  Result Value Ref Range   MRSA by PCR NEGATIVE NEGATIVE    Comment:        The GeneXpert MRSA Assay (FDA approved for NASAL specimens only), is one component of a comprehensive MRSA colonization surveillance program. It is not intended to diagnose MRSA infection nor to guide or monitor treatment for MRSA infections. Performed at [facility], [address] [place], [place] [address]   Basic metabolic panel     Status: Abnormal   Collection Time: 08/22/18  4:50 AM  Result Value Ref Range   Sodium 139 135 - 145 mmol/L   Potassium 3.4 (L) 3.5 - 5.1 mmol/L   Chloride 99 98 - 111 mmol/L   CO2 31 22 - 32 mmol/L   Glucose, Bld 89 70 - 99 mg/dL   BUN 14 8 - 23 mg/dL   Creatinine, Ser 1.03 0.61 - 1.24 mg/dL   Calcium 9.8 8.9 - 10.3 mg/dL   GFR calc non Af Amer >60 >60 mL/min   GFR calc Af Amer >60 >60 mL/min   Anion gap 9 5 - 15    Comment: Performed at [facility], [address], [place], [place] [address]  Magnesium      Status: None   Collection Time: 08/22/18  4:50 AM  Result Value Ref Range   Magnesium 1.8 1.7 - 2.4 mg/dL    Comment: Performed at [facility], [address] [place], [place] [address]  Phosphorus     Status: None   Collection Time: 08/22/18  4:50 AM  Result Value Ref Range   Phosphorus 3.4 2.5 - 4.6 mg/dL    Comment: Performed at [facility], [address] [place], [place] [address]  Albumin     Status: None   Collection Time: 08/22/18  4:50 AM  Result Value Ref Range   Albumin 3.8 3.5 - 5.0 g/dL    Comment: Performed at [facility], [address] [place], [place] [address]    Current Facility-Administered Medications  Medication Dose Route Frequency Provider Last Rate Last Dose  . acetaminophen (TYLENOL) tablet 650 mg  650 mg Oral Q6H PRN Harrie Foreman, MD       Or  . acetaminophen (TYLENOL) suppository 650 mg  650 mg Rectal Q6H PRN Harrie Foreman, MD      . albuterol (PROVENTIL) (2.5 MG/3ML) 0.083% nebulizer solution 2.5 mg  2.5 mg Nebulization Q4H PRN Harrie Foreman, MD      . chlorthalidone (HYGROTON) tablet 12.5 mg  12.5 mg Oral Daily Harrie Foreman, MD   12.5 mg at 08/22/18 0851  . divalproex (DEPAKOTE ER) 24 hr tablet 250 mg  250 mg Oral BID Harrie Foreman, MD   250 mg at 08/22/18 0850  . docusate sodium (COLACE) capsule 100 mg  100 mg Oral BID Harrie Foreman, MD   100 mg at 08/22/18 0850  . enoxaparin (LOVENOX) injection 40 mg  40 mg Subcutaneous Q24H Harrie Foreman, MD   40 mg at 08/22/18 0850  . LORazepam (ATIVAN) tablet 1 mg  1 mg Oral Q6H PRN Harrie Foreman, MD      . metoprolol tartrate (LOPRESSOR) tablet 50 mg  50 mg Oral BID Harrie Foreman, MD   50 mg at 08/22/18 0851  . ondansetron (ZOFRAN) tablet 4 mg  4 mg Oral Q6H PRN Rosilyn Mings  S, MD       Or  . ondansetron (ZOFRAN) injection 4 mg  4 mg Intravenous Q6H PRN Harrie Foreman, MD      . QUEtiapine (SEROQUEL XR) 24 hr tablet 400 mg   400 mg Oral QHS Harrie Foreman, MD   400 mg at 08/21/18 2106  . QUEtiapine (SEROQUEL) tablet 12.5 mg  12.5 mg Oral BID BM Harrie Foreman, MD   12.5 mg at 08/22/18 0851  . sodium chloride flush (NS) 0.9 % injection 10 mL  10 mL Intravenous Q12H Gouru, Aruna, MD   10 mL at 08/21/18 2107  . traZODone (DESYREL) tablet 50 mg  50 mg Oral QHS Harrie Foreman, MD   50 mg at 08/21/18 2106  . trihexyphenidyl (ARTANE) tablet 5 mg  5 mg Oral TID WC Harrie Foreman, MD   5 mg at 08/22/18 1248  . umeclidinium-vilanterol (ANORO ELLIPTA) 62.5-25 MCG/INH 1 puff  1 puff Inhalation Daily Harrie Foreman, MD   1 puff at 08/22/18 1941    Musculoskeletal: Strength & Muscle Tone: decreased Gait & Station: Remained in bed during evaluation Patient leans: N/A  Psychiatric Specialty Exam: Physical Exam  Nursing note and vitals reviewed. Constitutional: He is oriented to person, place, and time. He appears well-developed and well-nourished.  HENT:  Head: Normocephalic.  Cardiovascular: Normal rate.  Respiratory: Effort normal.  Musculoskeletal: Normal range of motion.  Neurological: He is alert and oriented to person, place, and time.  Psychiatric: His behavior is normal. Judgment and thought content normal. His mood appears anxious. His affect is blunt. His speech is slurred. Cognition and memory are impaired.    Review of Systems  Constitutional: Negative.   HENT: Negative.   Eyes: Negative.   Respiratory: Negative.   Cardiovascular: Negative.   Genitourinary: Negative.   Musculoskeletal: Negative.   Skin: Negative.   Neurological: Positive for tremors (Bilateral hands).  Endo/Heme/Allergies: Negative.   Psychiatric/Behavioral: Positive for memory loss. The patient is nervous/anxious.     Blood pressure 106/80, pulse 92, temperature 98.1 F (36.7 C), temperature source Oral, resp. rate 19, height 5\' 4"  (1.626 m), weight 52 kg, SpO2 98 %.Body mass index is 19.69 kg/m.  General  Appearance: Disheveled  Eye Contact:  Fair  Speech:  Garbled, but somewhat understandable  Volume:  Decreased  Mood:  Euthymic  Affect:  Flat  Thought Process:  Coherent and Descriptions of Associations: Intact  Orientation:  Other:  person and place  Thought Content:  WDL  Suicidal Thoughts:  No  Homicidal Thoughts:  No  Memory:  Immediate;   Fair Recent;   Fair  Judgement:  Fair  Insight:  Fair  Psychomotor Activity:  Normal  Concentration:  Concentration: Fair  Recall:  [employer] of Knowledge:  Fair  Language:  Poor  Akathisia:  No  Handed:  Right  AIMS (if indicated):     Assets:  Communication Skills Desire for Improvement Financial Resources/Insurance Housing Resilience Social Support Transportation  ADL's:  Intact  Cognition:  WNL  Sleep:        Treatment Plan Summary: Schizophrenia: -Continue Seroquel 400 mg at bedtime -Discontinue Seroquel 12.5 mg BID  -Start Haldol 2.5 mg daily, plan to start long term antipsychotic injectable of Haldol decanoate 25 mg with his outpatient provider  EPS: -Continue Artane 5 mg TID   Mood stabilization: -Continue Depakote 250 mg BID  Anxiety/agitation: -Continue Ativan 1 mg every six hours PRN  Insomnia: -Continue Trazodone 50 mg  at bedtime  Disposition: Discharge to group home with continuation of Haldol with the purpose of starting Haldol decanoate from his psychiatric outpatient provider   Waylan Boga, NP 08/22/2018 12:52 PM

## 2018-08-22 NOTE — NC FL2 (Signed)
[place] LEVEL OF CARE SCREENING TOOL     IDENTIFICATION  Patient Name: Carlos Nelson Birthdate: 03-Dec-1950 Sex: male Admission Date (Current Location): 08/20/2018  [place] and [place] Number:  Engineering geologist and Address:  [facility], [address] [place], [place] [address]      Provider Number: [id number]  Attending Physician Name and Address:  Nicholes Mango, MD  Relative Name and Phone Number:       Current Level of Care: ([place]) Recommended Level of Care: [place] Prior Approval Number:    Date Approved/Denied:   PASRR Number:    Discharge Plan: ([facility])    Current Diagnoses: Patient Active Problem List   Diagnosis Date Noted  . Schizophrenia ([place])   . Tachycardia 08/21/2018  . Schizophrenia, chronic with acute exacerbation ([place]) 08/17/2018  . Elevated PSA 05/24/2015    Orientation RESPIRATION BLADDER Height & Weight     Self  Normal Continent Weight: 52 kg Height:  5\' 4"  (162.6 cm)  BEHAVIORAL SYMPTOMS/MOOD NEUROLOGICAL BOWEL NUTRITION STATUS  (Arrived to ED combative.  No behaviors observed on nursing unit)   Continent Diet  AMBULATORY STATUS COMMUNICATION OF NEEDS Skin     Verbally                         Personal Care Assistance Level of Assistance              Functional Limitations Info             SPECIAL CARE FACTORS FREQUENCY                       Contractures Contractures Info: Not present    Additional Factors Info  Code Status Code Status Info: full             Current Medications (08/22/2018):  This is the current hospital active medication list Current Facility-Administered Medications  Medication Dose Route Frequency Provider Last Rate Last Dose  . acetaminophen (TYLENOL) tablet 650 mg  650 mg Oral Q6H PRN Harrie Foreman, MD       Or  . acetaminophen (TYLENOL) suppository 650 mg  650 mg  Rectal Q6H PRN Harrie Foreman, MD      . albuterol (PROVENTIL) (2.5 MG/3ML) 0.083% nebulizer solution 2.5 mg  2.5 mg Nebulization Q4H PRN Harrie Foreman, MD      . chlorthalidone (HYGROTON) tablet 12.5 mg  12.5 mg Oral Daily Harrie Foreman, MD   12.5 mg at 08/22/18 0851  . divalproex (DEPAKOTE ER) 24 hr tablet 250 mg  250 mg Oral BID Harrie Foreman, MD   250 mg at 08/22/18 0850  . docusate sodium (COLACE) capsule 100 mg  100 mg Oral BID Harrie Foreman, MD   100 mg at 08/22/18 0850  . enoxaparin (LOVENOX) injection 40 mg  40 mg Subcutaneous Q24H Harrie Foreman, MD   40 mg at 08/22/18 0850  . [START ON 08/23/2018] haloperidol (HALDOL) tablet 2.5 mg  2.5 mg Oral Daily Lord, Jamison Y, NP      . LORazepam (ATIVAN) tablet 1 mg  1 mg Oral Q6H PRN Harrie Foreman, MD      . metoprolol tartrate (LOPRESSOR) tablet 50 mg  50 mg Oral BID Harrie Foreman, MD   50 mg at 08/22/18 0851  . ondansetron (ZOFRAN) tablet 4 mg  4 mg Oral Q6H PRN Harrie Foreman, MD       Or  . ondansetron [facility]) injection 4 mg  4 mg Intravenous Q6H PRN Harrie Foreman, MD      . QUEtiapine (SEROQUEL XR) 24 hr tablet 400 mg  400 mg Oral QHS Harrie Foreman, MD   400 mg at 08/21/18 2106  . sodium chloride flush (NS) 0.9 % injection 10 mL  10 mL Intravenous Q12H Gouru, Aruna, MD   10 mL at 08/21/18 2107  . traZODone (DESYREL) tablet 50 mg  50 mg Oral QHS Harrie Foreman, MD   50 mg at 08/21/18 2106  . trihexyphenidyl (ARTANE) tablet 5 mg  5 mg Oral TID WC Harrie Foreman, MD   5 mg at 08/22/18 1248  . umeclidinium-vilanterol (ANORO ELLIPTA) 62.5-25 MCG/INH 1 puff  1 puff Inhalation Daily Harrie Foreman, MD   1 puff at 08/22/18 9179     Discharge Medications: Please see discharge summary for a list of discharge medications.  Relevant Imaging Results:  Relevant Lab Results:   Additional Information Yachet Mattson Carlota Raspberry RN  Katrina Stack, RN

## 2018-08-22 NOTE — Discharge Instructions (Signed)
Follow-up with primary care physician in 3 days Outpatient follow-up with psychiatry provider in 5 to 7 days

## 2018-08-22 NOTE — Progress Notes (Signed)
Patient discharged back to group home. Paperwork given to and went over with Cleora Fleet from the group home. IV and tele d/c'd prior to discharge.

## 2018-08-22 NOTE — Consult Note (Addendum)
PHARMACY CONSULT NOTE - FOLLOW UP  Pharmacy Consult for Electrolyte Monitoring and Replacement   Recent Labs: Potassium (mmol/L)  Date Value  08/22/2018 3.4 (L)  11/09/2013 4.3   Magnesium (mg/dL)  Date Value  08/22/2018 1.8   Calcium (mg/dL)  Date Value  08/22/2018 9.8   Calcium, Total (mg/dL)  Date Value  11/09/2013 9.3   Albumin (g/dL)  Date Value  08/22/2018 3.8  11/09/2013 4.1   Phosphorus (mg/dL)  Date Value  08/22/2018 3.4   Sodium (mmol/L)  Date Value  08/22/2018 139  11/09/2013 143  Corrected Ca: 9.96  Assessment: Patient presents with chest pain. Presently hypokalemic and magnesium is slightly low (per our protocol).  8/6 @ 1313: K 3.9. No replacements needed at this time.  8/7 @ 0450: K 3.4 and Mg 1.8. Will need to gently replace electrolytes.   Goal of Therapy:  Electrolytes WNL  Plan:  Will order KCL PO 20 mEq x 1 dose.   Will order Magnesium 2 g x1 dose.    Will follow electrolytes with AM labs.   Rowland Lathe ,PharmD Clinical Pharmacist 08/22/2018 7:41 AM

## 2018-09-07 ENCOUNTER — Encounter: Payer: Self-pay | Admitting: Emergency Medicine

## 2018-09-07 ENCOUNTER — Emergency Department
Admission: EM | Admit: 2018-09-07 | Discharge: 2018-09-07 | Disposition: A | Discharge status: Home / Self Care | Payer: Medicare Other | Attending: Emergency Medicine | Admitting: Emergency Medicine

## 2018-09-07 ENCOUNTER — Emergency Department: Payer: Medicare Other

## 2018-09-07 ENCOUNTER — Emergency Department
Admission: EM | Admit: 2018-09-07 | Discharge: 2018-09-07 | Disposition: A | Discharge status: Home / Self Care | Payer: Medicare Other | Source: Home / Self Care | Attending: Emergency Medicine | Admitting: Emergency Medicine

## 2018-09-07 ENCOUNTER — Other Ambulatory Visit: Payer: Self-pay

## 2018-09-07 DIAGNOSIS — R079 Chest pain, unspecified: Secondary | ICD-10-CM | POA: Diagnosis not present

## 2018-09-07 DIAGNOSIS — R0789 Other chest pain: Secondary | ICD-10-CM | POA: Insufficient documentation

## 2018-09-07 DIAGNOSIS — J449 Chronic obstructive pulmonary disease, unspecified: Secondary | ICD-10-CM | POA: Insufficient documentation

## 2018-09-07 DIAGNOSIS — I10 Essential (primary) hypertension: Secondary | ICD-10-CM | POA: Insufficient documentation

## 2018-09-07 DIAGNOSIS — Z87891 Personal history of nicotine dependence: Secondary | ICD-10-CM | POA: Insufficient documentation

## 2018-09-07 DIAGNOSIS — Z79899 Other long term (current) drug therapy: Secondary | ICD-10-CM | POA: Insufficient documentation

## 2018-09-07 DIAGNOSIS — F209 Schizophrenia, unspecified: Secondary | ICD-10-CM | POA: Insufficient documentation

## 2018-09-07 DIAGNOSIS — Z888 Allergy status to other drugs, medicaments and biological substances status: Secondary | ICD-10-CM | POA: Insufficient documentation

## 2018-09-07 LAB — TROPONIN I (HIGH SENSITIVITY)
Troponin I (High Sensitivity): 17 ng/L (ref <18)
Troponin I (High Sensitivity): 14 ng/L (ref <18)

## 2018-09-07 LAB — CBC
HCT: 43.5 % (ref 39.0–52.0)
Hemoglobin: 14.2 g/dL (ref 13.0–17.0)
MCH: 29.7 pg (ref 26.0–34.0)
MCHC: 32.6 g/dL (ref 30.0–36.0)
MCV: 91 fL (ref 80.0–100.0)
Platelets: 304 10*3/uL (ref 150–400)
RBC: 4.78 MIL/uL (ref 4.22–5.81)
RDW: 12.6 % (ref 11.5–15.5)
WBC: 11.3 10*3/uL — ABNORMAL HIGH (ref 4.0–10.5)
nRBC: 0 % (ref 0.0–0.2)

## 2018-09-07 LAB — BASIC METABOLIC PANEL
Anion gap: 7 (ref 5–15)
BUN: 6 mg/dL — ABNORMAL LOW (ref 8–23)
CO2: 29 mmol/L (ref 22–32)
Calcium: 9.5 mg/dL (ref 8.9–10.3)
Chloride: 103 mmol/L (ref 98–111)
Creatinine, Ser: 0.96 mg/dL (ref 0.61–1.24)
GFR calc Af Amer: >60 mL/min (ref >60)
GFR calc non Af Amer: >60 mL/min (ref >60)
Glucose, Bld: 80 mg/dL (ref 70–99)
Potassium: 4 mmol/L (ref 3.5–5.1)
Sodium: 139 mmol/L (ref 135–145)

## 2018-09-07 MED ORDER — SODIUM CHLORIDE 0.9% FLUSH: 3.0000 mL | Freq: Once | INTRAVENOUS | Status: DC

## 2018-09-07 NOTE — ED Triage Notes (Signed)
Pt presents via EMS c/o right sided chest pain. Reports pain worse with exertion. Reports dyspnea with exertion. EMS reports pt walked out of group home with am, unknown if compliant with psych meds per EMS report.

## 2018-09-07 NOTE — ED Provider Notes (Signed)
[facility] Emergency Department Provider Note   ____________________________________________    I have reviewed the triage vital signs and the nursing notes.   HISTORY  Chief Complaint Chest Pain and Mental Health Problem     HPI Carlos Nelson is a 68 y.o. male with a history of schizophrenia, hypertension, COPD who told triage nurse that he is here for chest pain although he was seen here earlier today and had normal work-up.  He admits to me that he is here because he does not like his care home.  He has no SI or HI.  He denies chest pain to me now.  No fevers or chills or cough.  No shortness of breath.  Otherwise feels well  Past Medical History:  Diagnosis Date  . COPD (chronic obstructive pulmonary disease) ([place])   . Hypertension   . Schizophrenia [facility])     Patient Active Problem List   Diagnosis Date Noted  . Schizophrenia ([place])   . Tachycardia 08/21/2018  . Schizophrenia, chronic with acute exacerbation ([place]) 08/17/2018  . Elevated PSA 05/24/2015    Past Surgical History:  Procedure Laterality Date  . COLONOSCOPY    . COLONOSCOPY WITH PROPOFOL N/A 12/13/2016   Procedure: COLONOSCOPY WITH PROPOFOL;  Surgeon: Lollie Sails, MD;  Location: [facility] ENDOSCOPY;  Service: Endoscopy;  Laterality: N/A;  . ESOPHAGOGASTRODUODENOSCOPY (EGD) WITH PROPOFOL N/A 12/13/2016   Procedure: ESOPHAGOGASTRODUODENOSCOPY (EGD) WITH PROPOFOL;  Surgeon: Lollie Sails, MD;  Location: [facility] ENDOSCOPY;  Service: Endoscopy;  Laterality: N/A;  . left arm surgery      Prior to Admission medications   Medication Sig Start Date End Date Taking? Authorizing Provider  albuterol (PROVENTIL HFA;VENTOLIN HFA) 108 (90 Base) MCG/ACT inhaler Inhale 1 puff into the lungs every 6 (six) hours as needed for wheezing or shortness of breath.    [provider]  amLODipine (NORVASC) 2.5 MG tablet Take 1 tablet (2.5 mg total) by mouth daily. 08/22/18   Nicholes Mango, MD  divalproex (DEPAKOTE ER) 250 MG 24 hr tablet Take 1 tablet (250 mg total) by mouth 2 (two) times daily. 08/17/18   Patrecia Pour, NP  haloperidol (HALDOL) 0.5 MG tablet Take 5 tablets (2.5 mg total) by mouth daily. 08/23/18   Gouru, Illene Silver, MD  LORazepam (ATIVAN) 1 MG tablet Take 1 tablet (1 mg total) by mouth every 6 (six) hours as needed for anxiety. 08/22/18   Nicholes Mango, MD  metoprolol tartrate (LOPRESSOR) 50 MG tablet Take 1 tablet (50 mg total) by mouth 2 (two) times daily. 08/22/18   Nicholes Mango, MD  QUEtiapine (SEROQUEL XR) 400 MG 24 hr tablet Take 400 mg by mouth at bedtime.    [provider]  traZODone (DESYREL) 50 MG tablet Take 50 mg by mouth at bedtime.    [provider]  trihexyphenidyl (ARTANE) 5 MG tablet Take 5 mg by mouth 3 (three) times daily with meals.    [provider]  umeclidinium-vilanterol (ANORO ELLIPTA) 62.5-25 MCG/INH AEPB Inhale 1 puff into the lungs daily.    [provider]     Allergies Ace inhibitors  Family History  Problem Relation Age of Onset  . Prostate cancer Neg Hx   . Bladder Cancer Neg Hx     Social History Social History   Tobacco Use  . Smoking status: Former Smoker    Packs/day: 1.00    Types: Cigarettes  . Smokeless tobacco: Never Used  Substance Use Topics  .  Alcohol use: Yes    Alcohol/week: 0.0 standard drinks    Comment: beer  . Drug use: Yes    Types: Marijuana    Comment: marjuana    Review of Systems  Constitutional: No fever/chills Eyes: No visual changes.  ENT: No sore throat. Cardiovascular: As above Respiratory: Denies shortness of breath. Gastrointestinal: No abdominal pain.  No nausea, no vomiting.   Genitourinary: Negative for dysuria. Musculoskeletal: Negative for back pain. Skin: Negative for rash. Neurological: Negative for headaches   ____________________________________________   PHYSICAL EXAM:  VITAL SIGNS: ED Triage Vitals [09/07/18 1956]  Enc  Vitals Group     BP (!) 133/94     Pulse Rate 95     Resp 16     Temp 98.7 F (37.1 C)     Temp src      SpO2 98 %     Weight 53 kg (116 lb 13.5 oz)     Height 1.626 m (5\' 4" )     Head Circumference      Peak Flow      Pain Score 4     Pain Loc      Pain Edu?      Excl. in [place]?     Constitutional: Alert and oriented.   .Nose: No congestion/rhinnorhea. Mouth/Throat: Mucous membranes are moist.    Cardiovascular: Normal rate, regular rhythm. Grossly normal heart sounds.  Good peripheral circulation. Respiratory: Normal respiratory effort.  No retractions. Lungs CTAB. Gastrointestinal: Soft and nontender. No distention.  No CVA tenderness.  Musculoskeletal: .  Warm and well perfused Neurologic:  Normal speech and language. No gross focal neurologic deficits are appreciated.  Skin:  Skin is warm, dry and intact. No rash noted. Psychiatric: Mood and affect are normal. Speech and behavior are normal.  ____________________________________________   LABS (all labs ordered are listed, but only abnormal results are displayed)  Labs Reviewed  TROPONIN I (HIGH SENSITIVITY)   ____________________________________________  EKG  ED ECG REPORT I, Lavonia Drafts, the attending physician, personally viewed and interpreted this ECG.  Date: 09/07/2018  Rhythm: normal sinus rhythm QRS Axis: normal Intervals: Abnormal ST/T Wave abnormalities: Nonspecific changes Narrative Interpretation: no evidence of acute ischemia  ____________________________________________  RADIOLOGY  None ____________________________________________   PROCEDURES  Procedure(s) performed: No  Procedures   Critical Care performed: No ____________________________________________   INITIAL IMPRESSION / ASSESSMENT AND PLAN / ED COURSE  Pertinent labs & imaging results that were available during my care of the patient were reviewed by me and considered in my medical decision making (see chart for  details).  Discussed with patient that emergency department is not the place to help him with switching to different care home.  His repeat troponin here is improved from prior, his EKG is unchanged.  He denies chest pain at this time.  Appropriate for discharge    ____________________________________________   FINAL CLINICAL IMPRESSION(S) / ED DIAGNOSES  Final diagnoses:  Atypical chest pain        Note:  This document was prepared using Dragon voice recognition software and may include unintentional dictation errors.   Lavonia Drafts, MD 09/07/18 2230

## 2018-09-07 NOTE — ED Triage Notes (Signed)
Pt brought to ED and dropped off by [employer] officer; pt from [facility]; pt c/ chest pain, says he thinks he's bleeding inside; pt was seen here earlier this am for the same thing and discharged back to the care home; pt returns tonight stating the main reason for his visit tonight is that he doesn't like where he's living; pt calm and cooperative; talking in complete sentences;

## 2018-09-07 NOTE — ED Notes (Signed)
[employer] will transport Carlos Nelson back to his group home. Spoke with Carlos Nelson who will talk with his sister tomorrow to try and find a different living arrangement. Ginger Ale and Catheryn Bacon given

## 2018-09-07 NOTE — ED Notes (Signed)
Meal tray provided per pt request.  

## 2018-09-07 NOTE — ED Provider Notes (Signed)
[facility] Emergency Department Provider Note       Time seen: ----------------------------------------- 11:36 AM on 09/07/2018 -----------------------------------------   I have reviewed the triage vital signs and the nursing notes.  HISTORY   Chief Complaint Chest Pain    HPI Carlos Nelson is a 68 y.o. male with a history of COPD, hypertension, schizophrenia who presents to the ED for right-sided chest pain.  Patient reports the pain is worse with exertion, does report some dyspnea with exertion at times.  He walked out of his group home this morning.  Pain is 5 out of 10 in his chest.  Past Medical History:  Diagnosis Date  . COPD (chronic obstructive pulmonary disease) ([place])   . Hypertension   . Schizophrenia [facility])     Patient Active Problem List   Diagnosis Date Noted  . Schizophrenia ([place])   . Tachycardia 08/21/2018  . Schizophrenia, chronic with acute exacerbation ([place]) 08/17/2018  . Elevated PSA 05/24/2015    Past Surgical History:  Procedure Laterality Date  . COLONOSCOPY    . COLONOSCOPY WITH PROPOFOL N/A 12/13/2016   Procedure: COLONOSCOPY WITH PROPOFOL;  Surgeon: Lollie Sails, MD;  Location: [facility] ENDOSCOPY;  Service: Endoscopy;  Laterality: N/A;  . ESOPHAGOGASTRODUODENOSCOPY (EGD) WITH PROPOFOL N/A 12/13/2016   Procedure: ESOPHAGOGASTRODUODENOSCOPY (EGD) WITH PROPOFOL;  Surgeon: Lollie Sails, MD;  Location: [facility] ENDOSCOPY;  Service: Endoscopy;  Laterality: N/A;  . left arm surgery      Allergies Ace inhibitors  Social History Social History   Tobacco Use  . Smoking status: Former Smoker    Packs/day: 1.00    Types: Cigarettes  . Smokeless tobacco: Never Used  Substance Use Topics  . Alcohol use: Yes    Alcohol/week: 0.0 standard drinks    Comment: beer  . Drug use: Yes    Types: Marijuana    Comment: marjuana    Review of Systems Constitutional: Negative for fever. Cardiovascular: Positive for  chest pain Respiratory: Negative for shortness of breath. Gastrointestinal: Negative for abdominal pain, vomiting and diarrhea. Musculoskeletal: Negative for back pain. Skin: Negative for rash. Neurological: Negative for headaches, focal weakness or numbness.  All systems negative/normal/unremarkable except as stated in the HPI  ____________________________________________   PHYSICAL EXAM:  VITAL SIGNS: ED Triage Vitals  Enc Vitals Group     BP 09/07/18 1132 (!) 155/98     Pulse Rate 09/07/18 1132 75     Resp 09/07/18 1132 14     Temp 09/07/18 1132 98.1 F (36.7 C)     Temp src --      SpO2 09/07/18 1132 98 %     Weight 09/07/18 1133 117 lb (53.1 kg)     Height --      Head Circumference --      Peak Flow --      Pain Score 09/07/18 1133 5     Pain Loc --      Pain Edu? --      Excl. in [place]? --     Constitutional: Alert and oriented. Well appearing and in no distress. Eyes: Conjunctivae are normal. Normal extraocular movements. ENT      Head: Normocephalic and atraumatic.      Nose: No congestion/rhinnorhea.      Mouth/Throat: Mucous membranes are moist.      Neck: No stridor. Cardiovascular: Normal rate, regular rhythm. No murmurs, rubs, or gallops. Respiratory: Normal respiratory effort without tachypnea nor retractions. Breath sounds are clear and equal bilaterally. No  wheezes/rales/rhonchi. Gastrointestinal: Soft and nontender. Normal bowel sounds Musculoskeletal: Nontender with normal range of motion in extremities. No lower extremity tenderness nor edema. Neurologic:  Normal speech and language. No gross focal neurologic deficits are appreciated.  Skin:  Skin is warm, dry and intact. No rash noted. Psychiatric: Mood and affect are normal. Speech and behavior are normal.  ____________________________________________  EKG: Interpreted by me.  Sinus rhythm with rate of 78 bpm, normal PR interval, normal axis, normal  QT  ____________________________________________  ED COURSE:  As part of my medical decision making, I reviewed the following data within the [place] History obtained from family if available, nursing notes, old chart and ekg, as well as notes from prior ED visits. Patient presented for right-sided chest pain, we will assess with labs and imaging as indicated at this time.   Procedures  FAWWAZ LOSCO was evaluated in Emergency Department on 09/07/2018 for the symptoms described in the history of present illness. He was evaluated in the context of the global COVID-19 pandemic, which necessitated consideration that the patient might be at risk for infection with the SARS-CoV-2 virus that causes COVID-19. Institutional protocols and algorithms that pertain to the evaluation of patients at risk for COVID-19 are in a state of rapid change based on information released by regulatory bodies including the CDC and federal and state organizations. These policies and algorithms were followed during the patient's care in the ED.  ____________________________________________   LABS (pertinent positives/negatives)  Labs Reviewed  BASIC METABOLIC PANEL - Abnormal; Notable for the following components:      Result Value   BUN 6 (*)    All other components within normal limits  CBC - Abnormal; Notable for the following components:   WBC 11.3 (*)    All other components within normal limits  TROPONIN I (HIGH SENSITIVITY)  TROPONIN I (HIGH SENSITIVITY)    RADIOLOGY Images were viewed by me  Chest x-ray IMPRESSION:  No active cardiopulmonary disease.  ____________________________________________   DIFFERENTIAL DIAGNOSIS   Musculoskeletal pain, GERD, anxiety, COPD, MI, PE schizophrenia  FINAL ASSESSMENT AND PLAN  Chest pain   Plan: The patient had presented for right-sided chest pain. Patient's labs did not reveal any acute process. Patient's imaging was unremarkable.   He appears cleared for outpatient follow-up, no clear etiology for his chest pain.  Will refer to cardiology.   Laurence Aly, MD    Note: This note was generated in part or whole with voice recognition software. Voice recognition is usually quite accurate but there are transcription errors that can and very often do occur. I apologize for any typographical errors that were not detected and corrected.     Earleen Newport, MD 09/07/18 [phone]

## 2018-09-25 ENCOUNTER — Emergency Department
Admission: EM | Admit: 2018-09-25 | Discharge: 2018-09-26 | Disposition: A | Discharge status: Home / Self Care | Payer: Medicare Other | Attending: Student in an Organized Health Care Education/Training Program | Admitting: Student in an Organized Health Care Education/Training Program

## 2018-09-25 ENCOUNTER — Emergency Department: Payer: Medicare Other

## 2018-09-25 ENCOUNTER — Encounter: Payer: Self-pay | Admitting: Emergency Medicine

## 2018-09-25 DIAGNOSIS — F29 Unspecified psychosis not due to a substance or known physiological condition: Secondary | ICD-10-CM | POA: Insufficient documentation

## 2018-09-25 DIAGNOSIS — R4182 Altered mental status, unspecified: Secondary | ICD-10-CM | POA: Diagnosis not present

## 2018-09-25 DIAGNOSIS — J449 Chronic obstructive pulmonary disease, unspecified: Secondary | ICD-10-CM | POA: Insufficient documentation

## 2018-09-25 DIAGNOSIS — Z87891 Personal history of nicotine dependence: Secondary | ICD-10-CM | POA: Insufficient documentation

## 2018-09-25 DIAGNOSIS — I10 Essential (primary) hypertension: Secondary | ICD-10-CM | POA: Insufficient documentation

## 2018-09-25 DIAGNOSIS — F209 Schizophrenia, unspecified: Secondary | ICD-10-CM | POA: Insufficient documentation

## 2018-09-25 DIAGNOSIS — Z79899 Other long term (current) drug therapy: Secondary | ICD-10-CM | POA: Diagnosis not present

## 2018-09-25 DIAGNOSIS — R462 Strange and inexplicable behavior: Secondary | ICD-10-CM

## 2018-09-25 LAB — URINALYSIS, COMPLETE (UACMP) WITH MICROSCOPIC
Bacteria, UA: NONE SEEN
Bilirubin Urine: NEGATIVE
Glucose, UA: NEGATIVE mg/dL
Hgb urine dipstick: NEGATIVE
Ketones, ur: NEGATIVE mg/dL
Leukocytes,Ua: NEGATIVE
Nitrite: NEGATIVE
Protein, ur: NEGATIVE mg/dL
Specific Gravity, Urine: 1.003 — ABNORMAL LOW (ref 1.005–1.030)
Squamous Epithelial / HPF: NONE SEEN (ref 0–5)
pH: 7 (ref 5.0–8.0)

## 2018-09-25 LAB — CBC WITH DIFFERENTIAL/PLATELET
Abs Immature Granulocytes: 0.03 10*3/uL (ref 0.00–0.07)
Basophils Absolute: 0 10*3/uL (ref 0.0–0.1)
Basophils Relative: 0 %
Eosinophils Absolute: 0.1 10*3/uL (ref 0.0–0.5)
Eosinophils Relative: 1 %
HCT: 41.5 % (ref 39.0–52.0)
Hemoglobin: 13.7 g/dL (ref 13.0–17.0)
Immature Granulocytes: 0 %
Lymphocytes Relative: 27 %
Lymphs Abs: 2.1 10*3/uL (ref 0.7–4.0)
MCH: 29.9 pg (ref 26.0–34.0)
MCHC: 33 g/dL (ref 30.0–36.0)
MCV: 90.6 fL (ref 80.0–100.0)
Monocytes Absolute: 0.7 10*3/uL (ref 0.1–1.0)
Monocytes Relative: 9 %
Neutro Abs: 4.8 10*3/uL (ref 1.7–7.7)
Neutrophils Relative %: 63 %
Platelets: 203 10*3/uL (ref 150–400)
RBC: 4.58 MIL/uL (ref 4.22–5.81)
RDW: 12.6 % (ref 11.5–15.5)
WBC: 7.7 10*3/uL (ref 4.0–10.5)
nRBC: 0 % (ref 0.0–0.2)

## 2018-09-25 LAB — COMPREHENSIVE METABOLIC PANEL
ALT: 19 U/L (ref 0–44)
AST: 22 U/L (ref 15–41)
Albumin: 3.7 g/dL (ref 3.5–5.0)
Alkaline Phosphatase: 66 U/L (ref 38–126)
Anion gap: 7 (ref 5–15)
BUN: 11 mg/dL (ref 8–23)
CO2: 28 mmol/L (ref 22–32)
Calcium: 9.5 mg/dL (ref 8.9–10.3)
Chloride: 105 mmol/L (ref 98–111)
Creatinine, Ser: 0.98 mg/dL (ref 0.61–1.24)
GFR calc Af Amer: >60 mL/min (ref >60)
GFR calc non Af Amer: >60 mL/min (ref >60)
Glucose, Bld: 80 mg/dL (ref 70–99)
Potassium: 3.9 mmol/L (ref 3.5–5.1)
Sodium: 140 mmol/L (ref 135–145)
Total Bilirubin: 0.5 mg/dL (ref 0.3–1.2)
Total Protein: 6.2 g/dL — ABNORMAL LOW (ref 6.5–8.1)

## 2018-09-25 LAB — TROPONIN I (HIGH SENSITIVITY): Troponin I (High Sensitivity): 9 ng/L (ref <18)

## 2018-09-25 MED ORDER — LORAZEPAM 1 MG PO TABS
1.0000 mg | ORAL_TABLET | Freq: Four times a day (QID) | ORAL | Status: DC | PRN
  Administered 2018-09-26: 01:00:00 1 mg via ORAL
  Filled 2018-09-25: qty 1

## 2018-09-25 MED ORDER — METOPROLOL TARTRATE 25 MG PO TABS
50.0000 mg | ORAL_TABLET | Freq: Two times a day (BID) | ORAL | Status: DC
  Administered 2018-09-25 – 2018-09-26 (×2): 50 mg via ORAL
  Filled 2018-09-25 (×3): qty 1

## 2018-09-25 MED ORDER — AMLODIPINE BESYLATE 5 MG PO TABS
2.5000 mg | ORAL_TABLET | Freq: Every day | ORAL | Status: DC
  Administered 2018-09-25 – 2018-09-26 (×2): 2.5 mg via ORAL
  Filled 2018-09-25 (×2): qty 1

## 2018-09-25 MED ORDER — MIDAZOLAM HCL 5 MG/5ML IJ SOLN
2.0000 mg | Freq: Once | INTRAMUSCULAR | Status: AC
  Administered 2018-09-25: 2 mg via INTRAMUSCULAR
  Filled 2018-09-25: qty 5

## 2018-09-25 MED ORDER — HALOPERIDOL LACTATE 5 MG/ML IJ SOLN
5.0000 mg | Freq: Once | INTRAMUSCULAR | Status: AC
  Administered 2018-09-25: 15:00:00 5 mg via INTRAMUSCULAR
  Filled 2018-09-25: qty 1

## 2018-09-25 MED ORDER — QUETIAPINE FUMARATE ER 200 MG PO TB24
400.0000 mg | ORAL_TABLET | Freq: Every day | ORAL | Status: DC
  Administered 2018-09-25: 23:00:00 400 mg via ORAL
  Filled 2018-09-25 (×2): qty 2

## 2018-09-25 MED ORDER — TRAZODONE HCL 50 MG PO TABS
50.0000 mg | ORAL_TABLET | Freq: Every day | ORAL | Status: DC
  Administered 2018-09-25: 21:00:00 50 mg via ORAL
  Filled 2018-09-25: qty 1

## 2018-09-25 MED ORDER — HALOPERIDOL 0.5 MG PO TABS
2.5000 mg | ORAL_TABLET | Freq: Every day | ORAL | Status: DC
  Administered 2018-09-25 – 2018-09-26 (×2): 2.5 mg via ORAL
  Filled 2018-09-25 (×2): qty 1

## 2018-09-25 NOTE — ED Notes (Signed)
This Rn spoke with Shantay richmond from [place]; st takes BP meds in the morning, afternoon and night. Supervisor st pt was given his BP medication in the morning.

## 2018-09-25 NOTE — ED Notes (Signed)
This Rn attempted 2x PIV w/o success. Pt combative at this time. Unable to establish PIV.

## 2018-09-25 NOTE — ED Notes (Addendum)
Patient OK to transfer to [facility] per Dr. Joni Fears  Patient had clothes packed in hospital bag from when he was admitted to ED; removed shirt, pants, underwear, socks, and shoes on arrival

## 2018-09-25 NOTE — ED Notes (Addendum)
This RN spoke with Carlos Nelson for TTS consult. Carlos Nelson will do a face-to-face assessment.

## 2018-09-25 NOTE — ED Notes (Signed)
EDT IT trainer in room 12 at this time.
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
## 2018-09-25 NOTE — ED Notes (Signed)
Changed patient from hospital gown into proper attire for the [place].

## 2018-09-25 NOTE — ED Notes (Signed)
Pt given meal tray and water at this time. Lydia NT remains at bedside.

## 2018-09-25 NOTE — ED Notes (Signed)
EDT Scott at bedside at this time. Sitter in place.

## 2018-09-25 NOTE — ED Triage Notes (Signed)
Pt to ED by EMS from [facility]. Per staff pt with AMS. Staff state that pt is confused and not acting normal.

## 2018-09-25 NOTE — ED Notes (Signed)
Report given to Maudie Mercury in quad

## 2018-09-25 NOTE — ED Provider Notes (Addendum)
[facility] Emergency Department Provider Note    First MD Initiated Contact with Patient 09/25/18 1419     (approximate)  I have reviewed the triage vital signs and the nursing notes.   HISTORY  Chief Complaint Altered Mental Status  Level V Caveat:  schizophrenia  HPI Carlos Nelson is a 68 y.o. male presents the ER with chief complaint of "chest pain for 3 months "but reportedly has been acting confused and altered after mechanical fall today.  Was reportedly trying to sit on the dressers.  Group home reporting bizarre behavior.  Reportedly compliant with his medications.    Past Medical History:  Diagnosis Date  . COPD (chronic obstructive pulmonary disease) ([place])   . Hypertension   . Schizophrenia ([place])    Family History  Problem Relation Age of Onset  . Prostate cancer Neg Hx   . Bladder Cancer Neg Hx    Past Surgical History:  Procedure Laterality Date  . COLONOSCOPY    . COLONOSCOPY WITH PROPOFOL N/A 12/13/2016   Procedure: COLONOSCOPY WITH PROPOFOL;  Surgeon: Lollie Sails, MD;  Location: [facility] ENDOSCOPY;  Service: Endoscopy;  Laterality: N/A;  . ESOPHAGOGASTRODUODENOSCOPY (EGD) WITH PROPOFOL N/A 12/13/2016   Procedure: ESOPHAGOGASTRODUODENOSCOPY (EGD) WITH PROPOFOL;  Surgeon: Lollie Sails, MD;  Location: [facility] ENDOSCOPY;  Service: Endoscopy;  Laterality: N/A;  . left arm surgery     Patient Active Problem List   Diagnosis Date Noted  . Altered mental status 09/25/2018  . Schizophrenia ([place])   . Tachycardia 08/21/2018  . Schizophrenia, chronic with acute exacerbation ([place]) 08/17/2018  . Elevated PSA 05/24/2015      Prior to Admission medications   Medication Sig Start Date End Date Taking? Authorizing Provider  albuterol (PROVENTIL HFA;VENTOLIN HFA) 108 (90 Base) MCG/ACT inhaler Inhale 1 puff into the lungs every 6 (six) hours as needed for wheezing or shortness of breath.    [provider]  amLODipine  (NORVASC) 2.5 MG tablet Take 1 tablet (2.5 mg total) by mouth daily. 08/22/18   Nicholes Mango, MD  divalproex (DEPAKOTE ER) 250 MG 24 hr tablet Take 1 tablet (250 mg total) by mouth 2 (two) times daily. 08/17/18   Patrecia Pour, NP  haloperidol (HALDOL) 0.5 MG tablet Take 5 tablets (2.5 mg total) by mouth daily. 08/23/18   Gouru, Illene Silver, MD  LORazepam (ATIVAN) 1 MG tablet Take 1 tablet (1 mg total) by mouth every 6 (six) hours as needed for anxiety. 08/22/18   Nicholes Mango, MD  metoprolol tartrate (LOPRESSOR) 50 MG tablet Take 1 tablet (50 mg total) by mouth 2 (two) times daily. 08/22/18   Nicholes Mango, MD  QUEtiapine (SEROQUEL XR) 400 MG 24 hr tablet Take 400 mg by mouth at bedtime.    [provider]  traZODone (DESYREL) 50 MG tablet Take 50 mg by mouth at bedtime.    [provider]  trihexyphenidyl (ARTANE) 5 MG tablet Take 5 mg by mouth 3 (three) times daily with meals.    [provider]  umeclidinium-vilanterol (ANORO ELLIPTA) 62.5-25 MCG/INH AEPB Inhale 1 puff into the lungs daily.    [provider]    Allergies Ace inhibitors    Social History Social History   Tobacco Use  . Smoking status: Former Smoker    Packs/day: 1.00    Types: Cigarettes  . Smokeless tobacco: Never Used  Substance Use Topics  . Alcohol use: Yes    Alcohol/week: 0.0 standard drinks    Comment:  beer  . Drug use: Yes    Types: Marijuana    Comment: marjuana    Review of Systems Patient denies headaches, rhinorrhea, blurry vision, numbness, shortness of breath, chest pain, edema, cough, abdominal pain, nausea, vomiting, diarrhea, dysuria, fevers, rashes or hallucinations unless otherwise stated above in HPI. ____________________________________________   PHYSICAL EXAM:  VITAL SIGNS: Vitals:   09/25/18 1800 09/25/18 1830  BP: (!) 167/96 (!) 170/98  Pulse:    Resp:    Temp:    SpO2:      Constitutional: Alert, pleasant and in NAD  Eyes: Conjunctivae are normal.   Head: Atraumatic. Nose: No congestion/rhinnorhea. Mouth/Throat: Mucous membranes are moist.   Neck: No stridor. Painless ROM.  Cardiovascular: Normal rate, regular rhythm. Grossly normal heart sounds.  Good peripheral circulation. Respiratory: Normal respiratory effort.  No retractions. Lungs CTAB. Gastrointestinal: Soft and nontender. No distention. No abdominal bruits. No CVA tenderness. Genitourinary: deferred Musculoskeletal: No lower extremity tenderness nor edema.  No joint effusions. Neurologic:  Normal speech and language. No gross focal neurologic deficits are appreciated. No facial droop Skin:  Skin is warm, dry and intact. No rash noted. Psychiatric: Mood and affect are normal. Speech and behavior are normal.  ____________________________________________   LABS (all labs ordered are listed, but only abnormal results are displayed)  Results for orders placed or performed during the hospital encounter of 09/25/18 (from the past 24 hour(s))  CBC with Differential/Platelet     Status: None   Collection Time: 09/25/18  3:31 PM  Result Value Ref Range   WBC 7.7 4.0 - 10.5 K/uL   RBC 4.58 4.22 - 5.81 MIL/uL   Hemoglobin 13.7 13.0 - 17.0 g/dL   HCT 41.5 39.0 - 52.0 %   MCV 90.6 80.0 - 100.0 fL   MCH 29.9 26.0 - 34.0 pg   MCHC 33.0 30.0 - 36.0 g/dL   RDW 12.6 11.5 - 15.5 %   Platelets 203 150 - 400 K/uL   nRBC 0.0 0.0 - 0.2 %   Neutrophils Relative % 63 %   Neutro Abs 4.8 1.7 - 7.7 K/uL   Lymphocytes Relative 27 %   Lymphs Abs 2.1 0.7 - 4.0 K/uL   Monocytes Relative 9 %   Monocytes Absolute 0.7 0.1 - 1.0 K/uL   Eosinophils Relative 1 %   Eosinophils Absolute 0.1 0.0 - 0.5 K/uL   Basophils Relative 0 %   Basophils Absolute 0.0 0.0 - 0.1 K/uL   Immature Granulocytes 0 %   Abs Immature Granulocytes 0.03 0.00 - 0.07 K/uL  Comprehensive metabolic panel     Status: Abnormal   Collection Time: 09/25/18  3:31 PM  Result Value Ref Range   Sodium 140 135 - 145 mmol/L    Potassium 3.9 3.5 - 5.1 mmol/L   Chloride 105 98 - 111 mmol/L   CO2 28 22 - 32 mmol/L   Glucose, Bld 80 70 - 99 mg/dL   BUN 11 8 - 23 mg/dL   Creatinine, Ser 0.98 0.61 - 1.24 mg/dL   Calcium 9.5 8.9 - 10.3 mg/dL   Total Protein 6.2 (L) 6.5 - 8.1 g/dL   Albumin 3.7 3.5 - 5.0 g/dL   AST 22 15 - 41 U/L   ALT 19 0 - 44 U/L   Alkaline Phosphatase 66 38 - 126 U/L   Total Bilirubin 0.5 0.3 - 1.2 mg/dL   GFR calc non Af Amer >60 >60 mL/min   GFR calc Af Amer >60 >60 mL/min  Anion gap 7 5 - 15  Urinalysis, Complete w Microscopic     Status: Abnormal   Collection Time: 09/25/18  3:31 PM  Result Value Ref Range   Color, Urine COLORLESS (A) YELLOW   APPearance CLEAR (A) CLEAR   Specific Gravity, Urine 1.003 (L) 1.005 - 1.030   pH 7.0 5.0 - 8.0   Glucose, UA NEGATIVE NEGATIVE mg/dL   Hgb urine dipstick NEGATIVE NEGATIVE   Bilirubin Urine NEGATIVE NEGATIVE   Ketones, ur NEGATIVE NEGATIVE mg/dL   Protein, ur NEGATIVE NEGATIVE mg/dL   Nitrite NEGATIVE NEGATIVE   Leukocytes,Ua NEGATIVE NEGATIVE   WBC, UA 0-5 0 - 5 WBC/hpf   Bacteria, UA NONE SEEN NONE SEEN   Squamous Epithelial / LPF NONE SEEN 0 - 5  Troponin I (High Sensitivity)     Status: None   Collection Time: 09/25/18  3:31 PM  Result Value Ref Range   Troponin I (High Sensitivity) 9 <18 ng/L   ____________________________________________  EKG My review and personal interpretation at Time: 14:29   Indication: ams  Rate: 70  Rhythm: sinus Axis: normal Other: normal intervals, no stemi ____________________________________________  RADIOLOGY  I personally reviewed all radiographic images ordered to evaluate for the above acute complaints and reviewed radiology reports and findings.  These findings were personally discussed with the patient.  Please see medical record for radiology report.  ____________________________________________   PROCEDURES  Procedure(s) performed:  .Critical Care Performed by: Merlyn Lot, MD  Authorized by: Merlyn Lot, MD   Critical care provider statement:    Critical care time (minutes):  30   Critical care time was exclusive of:  Separately billable procedures and treating other patients   Critical care was necessary to treat or prevent imminent or life-threatening deterioration of the following conditions:  CNS failure or compromise   Critical care was time spent personally by me on the following activities:  Development of treatment plan with patient or surrogate, discussions with consultants, evaluation of patient's response to treatment, examination of patient, obtaining history from patient or surrogate, ordering and performing treatments and interventions, ordering and review of laboratory studies, ordering and review of radiographic studies, pulse oximetry, re-evaluation of patient's condition and review of old charts      Critical Care performed: no ____________________________________________   INITIAL IMPRESSION / [place] / ED COURSE  Pertinent labs & imaging results that were available during my care of the patient were reviewed by me and considered in my medical decision making (see chart for details).   DDX: Dehydration, sepsis, pna, uti, hypoglycemia, cva, drug effect, withdrawal, encephalitis   Carlos Nelson is a 68 y.o. who presents to the ED with bizarre behavior and fall as reported above but patient reporting 3 months of chest pain.  EKG does not show any evidence of acute ischemia.  His troponin is negative.  Is not consistent with ACS.  No evidence of traumatic injury.  Blood work is reassuring.  I do worry that this is worsening his underlying schizophrenia.  Will consult psychiatry. Clinical Course as of Sep 25 1955  Thu Sep 25, 2018  1459 Patient becoming increasingly combative and agitated.  For his safety and appropriately evaluate the patient will provide IM calming agent.   [PR]    Clinical Course User Index [PR]  Merlyn Lot, MD    The patient was evaluated in Emergency Department today for the symptoms described in the history of present illness. He/she was evaluated in the context of  the global COVID-19 pandemic, which necessitated consideration that the patient might be at risk for infection with the SARS-CoV-2 virus that causes COVID-19. Institutional protocols and algorithms that pertain to the evaluation of patients at risk for COVID-19 are in a state of rapid change based on information released by regulatory bodies including the CDC and federal and state organizations. These policies and algorithms were followed during the patient's care in the ED.  As part of my medical decision making, I reviewed the following data within the [facility] notes reviewed and incorporated, Labs reviewed, notes from prior ED visits and [place] Controlled Substance Database   ____________________________________________   FINAL CLINICAL IMPRESSION(S) / ED DIAGNOSES  Final diagnoses:  Bizarre behavior      NEW MEDICATIONS STARTED DURING THIS VISIT:  New Prescriptions   No medications on file     Note:  This document was prepared using Dragon voice recognition software and may include unintentional dictation errors.    Merlyn Lot, MD 09/25/18 1641    Merlyn Lot, MD 09/25/18 [phone]

## 2018-09-25 NOTE — ED Notes (Addendum)
Pt out of bed, posey alarm in place. RN Collyn in room to assist pt. Pt back safety back in bed with alarm on. This Rn spoke with charge nurse to place a sitter in room.

## 2018-09-25 NOTE — Consult Note (Signed)
[facility] Consult   Reason for Consult:  Altered mental status Referring Physician:  EDP Patient Identification: Carlos Nelson MRN:  [id number] Principal Diagnosis: Altered mental status Diagnosis:  Principal Problem:   Altered mental status   Total Time spent with patient: 30 minutes  Subjective:   Carlos Nelson is a 68 y.o. male patient denies suicidal or homicidal thoughts and denies visual hallucinations. Did admit to auditory hallucinations. He states remembering having a fall.  HPI:  Per Dr. Quentin Cornwall: 68 y.o. male presents the ER with chief complaint of "chest pain for 3 months "but reportedly has been acting confused and altered after mechanical fall today.  Was reportedly trying to sit on the dressers.  Group home reporting bizarre behavior.  Reportedly compliant with his medications.  Patient is seen by this provider face-to-face. Patient is lying in bed calm and cooperative at this time. The sitter reports that the patient woke up swinging at the air, but never said anything. His speech is garbled and only one word answers are understandable. He answered yes to having a fall. He answered no to suicidal or homicidal ideations and hallucinations. Unable to reach anyone at [facility]. It was reported that he was at [facility], but he moved from there on August 19th 2020. Feel that patient needs to be monitored overnight as report at triage was of bizarre and unusual behavior.  Past Psychiatric History: Schizophrenia  Risk to Self:   Risk to Others:   Prior Inpatient Therapy:   Prior Outpatient Therapy:    Past Medical History:  Past Medical History:  Diagnosis Date  . COPD (chronic obstructive pulmonary disease) ([place])   . Hypertension   . Schizophrenia [facility])     Past Surgical History:  Procedure Laterality Date  . COLONOSCOPY    . COLONOSCOPY WITH PROPOFOL N/A 12/13/2016   Procedure: COLONOSCOPY WITH PROPOFOL;  Surgeon:  Lollie Sails, MD;  Location: [facility] ENDOSCOPY;  Service: Endoscopy;  Laterality: N/A;  . ESOPHAGOGASTRODUODENOSCOPY (EGD) WITH PROPOFOL N/A 12/13/2016   Procedure: ESOPHAGOGASTRODUODENOSCOPY (EGD) WITH PROPOFOL;  Surgeon: Lollie Sails, MD;  Location[facility] ENDOSCOPY;  Service: Endoscopy;  Laterality: N/A;  . left arm surgery     Family History:  Family History  Problem Relation Age of Onset  . Prostate cancer Neg Hx   . Bladder Cancer Neg Hx    Family Psychiatric  History: Unknown Social History:  Social History   Substance and Sexual Activity  Alcohol Use Yes  . Alcohol/week: 0.0 standard drinks   Comment: beer     Social History   Substance and Sexual Activity  Drug Use Yes  . Types: Marijuana   Comment: marjuana    Social History   Socioeconomic History  . Marital status: Single    Spouse name: Not on file  . Number of children: Not on file  . Years of education: Not on file  . Highest education level: Not on file  Occupational History  . Not on file  Social Needs  . Financial resource strain: Not on file  . Food insecurity    Worry: Not on file    Inability: Not on file  . Transportation needs    Medical: Not on file    Non-medical: Not on file  Tobacco Use  . Smoking status: Former Smoker    Packs/day: 1.00    Types: Cigarettes  . Smokeless tobacco: Never Used  Substance and Sexual Activity  . Alcohol use: Yes  Alcohol/week: 0.0 standard drinks    Comment: beer  . Drug use: Yes    Types: Marijuana    Comment: marjuana  . Sexual activity: Not on file  Lifestyle  . Physical activity    Days per week: Not on file    Minutes per session: Not on file  . Stress: Not on file  Relationships  . Social Herbalist on phone: Not on file    Gets together: Not on file    Attends religious service: Not on file    Active member of club or organization: Not on file    Attends meetings of clubs or organizations: Not on file    Relationship  status: Not on file  Other Topics Concern  . Not on file  Social History Narrative  . Not on file   Additional Social History:    Allergies:   Allergies  Allergen Reactions  . Ace Inhibitors Swelling    Labs:  Results for orders placed or performed during the hospital encounter of 09/25/18 (from the past 48 hour(s))  CBC with Differential/Platelet     Status: None   Collection Time: 09/25/18  3:31 PM  Result Value Ref Range   WBC 7.7 4.0 - 10.5 K/uL   RBC 4.58 4.22 - 5.81 MIL/uL   Hemoglobin 13.7 13.0 - 17.0 g/dL   HCT 41.5 39.0 - 52.0 %   MCV 90.6 80.0 - 100.0 fL   MCH 29.9 26.0 - 34.0 pg   MCHC 33.0 30.0 - 36.0 g/dL   RDW 12.6 11.5 - 15.5 %   Platelets 203 150 - 400 K/uL   nRBC 0.0 0.0 - 0.2 %   Neutrophils Relative % 63 %   Neutro Abs 4.8 1.7 - 7.7 K/uL   Lymphocytes Relative 27 %   Lymphs Abs 2.1 0.7 - 4.0 K/uL   Monocytes Relative 9 %   Monocytes Absolute 0.7 0.1 - 1.0 K/uL   Eosinophils Relative 1 %   Eosinophils Absolute 0.1 0.0 - 0.5 K/uL   Basophils Relative 0 %   Basophils Absolute 0.0 0.0 - 0.1 K/uL   Immature Granulocytes 0 %   Abs Immature Granulocytes 0.03 0.00 - 0.07 K/uL    Comment: Performed at [facility], [address] [place], [place] [address]  Comprehensive metabolic panel     Status: Abnormal   Collection Time: 09/25/18  3:31 PM  Result Value Ref Range   Sodium 140 135 - 145 mmol/L   Potassium 3.9 3.5 - 5.1 mmol/L   Chloride 105 98 - 111 mmol/L   CO2 28 22 - 32 mmol/L   Glucose, Bld 80 70 - 99 mg/dL   BUN 11 8 - 23 mg/dL   Creatinine, Ser 0.98 0.61 - 1.24 mg/dL   Calcium 9.5 8.9 - 10.3 mg/dL   Total Protein 6.2 (L) 6.5 - 8.1 g/dL   Albumin 3.7 3.5 - 5.0 g/dL   AST 22 15 - 41 U/L   ALT 19 0 - 44 U/L   Alkaline Phosphatase 66 38 - 126 U/L   Total Bilirubin 0.5 0.3 - 1.2 mg/dL   GFR calc non Af Amer >60 >60 mL/min   GFR calc Af Amer >60 >60 mL/min   Anion gap 7 5 - 15    Comment: Performed at [facility], [address], [place], [place] [address]  Urinalysis, Complete w Microscopic     Status: Abnormal   Collection Time: 09/25/18  3:31  PM  Result Value Ref Range   Color, Urine COLORLESS (A) YELLOW   APPearance CLEAR (A) CLEAR   Specific Gravity, Urine 1.003 (L) 1.005 - 1.030   pH 7.0 5.0 - 8.0   Glucose, UA NEGATIVE NEGATIVE mg/dL   Hgb urine dipstick NEGATIVE NEGATIVE   Bilirubin Urine NEGATIVE NEGATIVE   Ketones, ur NEGATIVE NEGATIVE mg/dL   Protein, ur NEGATIVE NEGATIVE mg/dL   Nitrite NEGATIVE NEGATIVE   Leukocytes,Ua NEGATIVE NEGATIVE   WBC, UA 0-5 0 - 5 WBC/hpf   Bacteria, UA NONE SEEN NONE SEEN   Squamous Epithelial / LPF NONE SEEN 0 - 5    Comment: Performed at [facility], [place], [place] [address]  Troponin I (High Sensitivity)     Status: None   Collection Time: 09/25/18  3:31 PM  Result Value Ref Range   Troponin I (High Sensitivity) 9 <18 ng/L    Comment: (NOTE) Elevated high sensitivity troponin I (hsTnI) values and significant  changes across serial measurements may suggest ACS but many other  chronic and acute conditions are known to elevate hsTnI results.  Refer to the "Links" section for chest pain algorithms and additional  guidance. Performed at [facility], [address] [place], [place] [address]     No current facility-administered medications for this encounter.    Current Outpatient Medications  Medication Sig Dispense Refill  . albuterol (PROVENTIL HFA;VENTOLIN HFA) 108 (90 Base) MCG/ACT inhaler Inhale 1 puff into the lungs every 6 (six) hours as needed for wheezing or shortness of breath.    Marland Kitchen amLODipine (NORVASC) 2.5 MG tablet Take 1 tablet (2.5 mg total) by mouth daily. 30 tablet 0  . divalproex (DEPAKOTE ER) 250 MG 24 hr tablet Take 1 tablet (250 mg total) by mouth 2 (two) times daily. 60 tablet 1  . haloperidol (HALDOL) 0.5 MG tablet Take 5 tablets (2.5 mg total) by mouth daily. 15 tablet 0  . LORazepam  (ATIVAN) 1 MG tablet Take 1 tablet (1 mg total) by mouth every 6 (six) hours as needed for anxiety. 15 tablet 0  . metoprolol tartrate (LOPRESSOR) 50 MG tablet Take 1 tablet (50 mg total) by mouth 2 (two) times daily. 60 tablet 0  . QUEtiapine (SEROQUEL XR) 400 MG 24 hr tablet Take 400 mg by mouth at bedtime.    . traZODone (DESYREL) 50 MG tablet Take 50 mg by mouth at bedtime.    . trihexyphenidyl (ARTANE) 5 MG tablet Take 5 mg by mouth 3 (three) times daily with meals.    Marland Kitchen umeclidinium-vilanterol (ANORO ELLIPTA) 62.5-25 MCG/INH AEPB Inhale 1 puff into the lungs daily.      Musculoskeletal: Strength & Muscle Tone: decreased Gait & Station: Remained in bed during evaluation Patient leans: N/A  Psychiatric Specialty Exam: Physical Exam  Nursing note and vitals reviewed. Constitutional: He appears well-developed and well-nourished.  Cardiovascular: Normal rate.  Respiratory: Effort normal.  Musculoskeletal: Normal range of motion.  Neurological: He is alert.    Review of Systems  Constitutional: Negative.   Respiratory: Negative.   Skin: Negative.   Psychiatric/Behavioral: Negative for hallucinations and suicidal ideas.    Blood pressure (!) 151/93, pulse 69, temperature 98.5 F (36.9 C), temperature source Oral, resp. rate 18, SpO2 96 %.There is no height or weight on file to calculate BMI.  General Appearance: Casual  Eye Contact:  Good  Speech:  Garbled and short one word answers are undertsandable  Volume:  Normal  Mood:  Euthymic  Affect:  Flat  Thought Process:  Unable to distinguish due to garbled speech  Orientation:  Other:  Unable to distinguish due to garbled speech  Thought Content:  Unable to distinguish due to garbled speech  Suicidal Thoughts:  No  Homicidal Thoughts:  No  Memory:  Unable to distinguish due to garbled speech  Judgement:  Other:  Unable to distinguish due to garbled speech  Insight:  Unable to distinguish due to garbled speech  Psychomotor  Activity:  Normal  Concentration:  Unable to distinguish due to garbled speech  Recall: Unable to distinguish due to garbled speech, but remember having a fall    Fund of Knowledge:  Unable to distinguish due to garbled speech  Language:  Poor  Akathisia:  No  Handed:    AIMS (if indicated):     Assets:  Housing Resilience Social Support  ADL's:  Unable to distinguish due to garbled speech  Cognition:  Unable to distinguish due to garbled speech  Sleep:        Treatment Plan Summary: Daily contact with patient to assess and evaluate symptoms and progress in treatment and Medication management  Restart home medications  Disposition: Observe patient overnight for stabilization  Lewis Shock, FNP 09/25/2018 5:47 PM

## 2018-09-25 NOTE — ED Notes (Signed)
Posey alarm in place and turned on.  

## 2018-09-25 NOTE — ED Notes (Signed)
Pt provided with water by this RN.  

## 2018-09-25 NOTE — ED Notes (Addendum)
This RN spoke with supervisor Darylene Price [phone] from [place]. Per [facility] supervisor pt transferred to [facility] 3 weeks ago. St pt AMD since yesterday, today at 230pm pt was found in his room st he doesn't feel "right".

## 2018-09-25 NOTE — ED Notes (Signed)
Psych at bedside.

## 2018-09-26 DIAGNOSIS — F209 Schizophrenia, unspecified: Secondary | ICD-10-CM

## 2018-09-26 NOTE — ED Notes (Signed)
VOL/Consult Completed/Moved to BHU-2

## 2018-09-26 NOTE — ED Notes (Signed)
Wash cloth given to wash hands before lunch.

## 2018-09-26 NOTE — ED Notes (Signed)
Pt discharged back to group home with group home staff.  VS stable. All belongings returned to patient. Pt denies SI/HI. Denies pain.  Discharge instructions reviewed with patient. Patient signed for discharge.

## 2018-09-26 NOTE — BH Assessment (Signed)
TTS has tried several times to call patient's group home at the number listed in patient's chart - [phone]. Apparently, the number belongs to a group home staff member for [facility] and she keeps telling this writer that she will have someone from the group home call to the hospital and she refuses to provide the direct number to the group home.

## 2018-09-26 NOTE — Discharge Instructions (Signed)
Follow up with Neurology for dementia evaluation

## 2018-09-26 NOTE — BH Assessment (Signed)
Assessment Note  Carlos Nelson is an 68 y.o. male who presents to the ER via EMS because his group home, [facility], is concerned about his behaviors. Per ER notes, the patient has had an increase of confusion and odd and bizarre behaviors.  Per report of the patient, he was brought to the ER due to shortens of breath. "I take one or two steps and I can't breathe."  Writer was unable to get much information from the patient. Patient was pleasant and calm and attempted to answer questions. However, he's speech was muffled and would mumble. He was able to share who is psychiatrist and reports that he takes his medications as prescribed.   Diagnosis: Schizophrenia  Past Medical History:  Past Medical History:  Diagnosis Date  . COPD (chronic obstructive pulmonary disease) ([place])   . Hypertension   . Schizophrenia [facility])     Past Surgical History:  Procedure Laterality Date  . COLONOSCOPY    . COLONOSCOPY WITH PROPOFOL N/A 12/13/2016   Procedure: COLONOSCOPY WITH PROPOFOL;  Surgeon: Lollie Sails, MD;  Location: [facility] ENDOSCOPY;  Service: Endoscopy;  Laterality: N/A;  . ESOPHAGOGASTRODUODENOSCOPY (EGD) WITH PROPOFOL N/A 12/13/2016   Procedure: ESOPHAGOGASTRODUODENOSCOPY (EGD) WITH PROPOFOL;  Surgeon: Lollie Sails, MD;  Location: [facility] ENDOSCOPY;  Service: Endoscopy;  Laterality: N/A;  . left arm surgery      Family History:  Family History  Problem Relation Age of Onset  . Prostate cancer Neg Hx   . Bladder Cancer Neg Hx     Social History:  reports that he has quit smoking. His smoking use included cigarettes. He smoked 1.00 pack per day. He has never used smokeless tobacco. He reports current alcohol use. He reports current drug use. Drug: Marijuana.  Additional Social History:  Alcohol / Drug Use Pain Medications: See PTA Prescriptions: See PTA Over the Counter: See PTA History of alcohol / drug use?: No history of alcohol / drug abuse Longest period of  sobriety (when/how long): None reported  CIWA: CIWA-Ar BP: 134/86 Pulse Rate: 68 COWS:    Allergies:  Allergies  Allergen Reactions  . Ace Inhibitors Swelling    Home Medications: (Not in a hospital admission)   OB/GYN Status:  No LMP for male patient.  General Assessment Data Location of Assessment: [facility] ED TTS Assessment: In system Is this a Tele or Face-to-Face Assessment?: Face-to-Face Is this an Initial Assessment or a Re-assessment for this encounter?: Initial Assessment Patient Accompanied by:: N/A Language Other than English: No Living Arrangements: In Group Home: (Comment: Name of Group Home)([facility]) What gender do you identify as?: Male Marital status: Single Pregnancy Status: No Living Arrangements: Group Home Can pt return to current living arrangement?: Yes Admission Status: Involuntary Petitioner: ED Attending Is patient capable of signing voluntary admission?: No(Under IVC) Referral Source: Self/Family/Friend Insurance type: Medicare A&B  Medical Screening Exam [facility] Walk-in ONLY) Medical Exam completed: Yes  Crisis Care Plan Living Arrangements: Group Home Legal Guardian: Other:(Self) Name of Psychiatrist: Dr. Rosine Door Name of Therapist: Unknown  Education Status Is patient currently in school?: No Is the patient employed, unemployed or receiving disability?: Unemployed, Receiving disability income  Risk to self with the past 6 months Suicidal Ideation: No Has patient been a risk to self within the past 6 months prior to admission? : No Suicidal Intent: No Has patient had any suicidal intent within the past 6 months prior to admission? : No Is patient at risk for suicide?: No Suicidal Plan?:  No Has patient had any suicidal plan within the past 6 months prior to admission? : No Access to Means: No What has been your use of drugs/alcohol within the last 12 months?: None reported Previous Attempts/Gestures: No How many times?:  0 Other Self Harm Risks: Reports of none Triggers for Past Attempts: None known Intentional Self Injurious Behavior: None Family Suicide History: Unknown Recent stressful life event(s): Other (Comment) Persecutory voices/beliefs?: No Depression: Yes Depression Symptoms: Isolating, Feeling angry/irritable Substance abuse history and/or treatment for substance abuse?: No Suicide prevention information given to non-admitted patients: Not applicable  Risk to Others within the past 6 months Homicidal Ideation: No Does patient have any lifetime risk of violence toward others beyond the six months prior to admission? : No Thoughts of Harm to Others: No Current Homicidal Intent: No Current Homicidal Plan: No Access to Homicidal Means: No Identified Victim: Reports of none History of harm to others?: No Assessment of Violence: None Noted Violent Behavior Description: Reports of none Does patient have access to weapons?: No Criminal Charges Pending?: No Does patient have a court date: No Is patient on probation?: No  Psychosis Hallucinations: Auditory, Visual Delusions: None noted  Mental Status Report Appearance/Hygiene: Unremarkable, In scrubs Eye Contact: Poor Motor Activity: Unable to assess Speech: Soft, Pressured Level of Consciousness: Alert Mood: Pleasant Affect: Appropriate to circumstance Anxiety Level: None Thought Processes: Relevant Judgement: Unable to Assess Orientation: Person, Place, Situation Obsessive Compulsive Thoughts/Behaviors: Unable to Assess  Cognitive Functioning Concentration: Unable to Assess Memory: Unable to Assess Is patient IDD: No Insight: Fair Impulse Control: Fair Appetite: Good Have you had any weight changes? : No Change Sleep: Unable to Assess Vegetative Symptoms: None  ADLScreening [facility] Assessment Services) Patient's cognitive ability adequate to safely complete daily activities?: Yes Patient able to express need for assistance  with ADLs?: Yes Independently performs ADLs?: Yes (appropriate for developmental age)  Prior Inpatient Therapy Prior Inpatient Therapy: Yes Prior Therapy Dates: 1970's Prior Therapy Facilty/Provider(s): [facility] Reason for Treatment: Schizophrenia  Prior Outpatient Therapy Prior Outpatient Therapy: Yes Prior Therapy Dates: Current Prior Therapy Facilty/Provider(s): Dr. Rosine Door Reason for Treatment: Schizophrenia Does patient have an ACCT team?: No Does patient have Intensive In-House Services?  : No Does patient have [facility] services? : No Does patient have P4CC services?: No  ADL Screening (condition at time of admission) Patient's cognitive ability adequate to safely complete daily activities?: Yes Is the patient deaf or have difficulty hearing?: No Does the patient have difficulty seeing, even when wearing glasses/contacts?: No Does the patient have difficulty concentrating, remembering, or making decisions?: No Patient able to express need for assistance with ADLs?: Yes Does the patient have difficulty dressing or bathing?: No Independently performs ADLs?: Yes (appropriate for developmental age) Does the patient have difficulty walking or climbing stairs?: No Weakness of Legs: None Weakness of Arms/Hands: None  Home Assistive Devices/Equipment Home Assistive Devices/Equipment: None  Therapy Consults (therapy consults require a physician order) PT Evaluation Needed: No OT Evalulation Needed: No SLP Evaluation Needed: No Abuse/Neglect Assessment (Assessment to be complete while patient is alone) Abuse/Neglect Assessment Can Be Completed: Yes Physical Abuse: Denies Verbal Abuse: Denies Sexual Abuse: Denies Exploitation of patient/patient's resources: Denies Self-Neglect: Denies Values / Beliefs Cultural Requests During Hospitalization: None Spiritual Requests During Hospitalization: None Consults Spiritual Care Consult Needed: No Social Work Consult Needed: No Armed forces training and education officer (For Healthcare) Does Patient Have a Medical Advance Directive?: No       Child/Adolescent Assessment Running Away Risk: Denies(Patient is an adult)  Disposition:  Disposition Initial Assessment Completed for this Encounter: Yes  On Site Evaluation by:   Reviewed with Physician:    Gunnar Fusi MS, LCAS, [facility], [place] Therapeutic Triage Specialist 09/26/2018 1:48 AM

## 2018-09-26 NOTE — Consult Note (Signed)
[facility] Psych ED Discharge  09/26/2018 4:04 PM Carlos Nelson  MRN:  [id number] Principal Problem: Schizophrenia, chronic with acute exacerbation [facility]) Discharge Diagnoses: Principal Problem:   Schizophrenia, chronic with acute exacerbation ([place])  Subjective: "I'm better."  Patient seen and evaluated by this provider in person.  He denies suicidal/homicidal ideations, hallucinations, and substance abuse.  He came to the ED yesterday with confusion and bizarre behaviors.  Today, he appears to be at his baseline.  No hallucinations or bizarre behaviors.  He does appear to be having some possible signs and symptoms of dementia with possible sundowning.  Group home was finally reached and their only concern was his inability to walk yesterday.  He is walking now without issues.  They also had some concerns about signs of dementia, agreeable to take patient to a neurologist for a thorough exam.  Total Time spent with patient: 30 minutes  Past Psychiatric History: schizophrenia  Past Medical History:  Past Medical History:  Diagnosis Date  . COPD (chronic obstructive pulmonary disease) ([place])   . Hypertension   . Schizophrenia [facility])     Past Surgical History:  Procedure Laterality Date  . COLONOSCOPY    . COLONOSCOPY WITH PROPOFOL N/A 12/13/2016   Procedure: COLONOSCOPY WITH PROPOFOL;  Surgeon: Lollie Sails, MD;  Location: [facility] ENDOSCOPY;  Service: Endoscopy;  Laterality: N/A;  . ESOPHAGOGASTRODUODENOSCOPY (EGD) WITH PROPOFOL N/A 12/13/2016   Procedure: ESOPHAGOGASTRODUODENOSCOPY (EGD) WITH PROPOFOL;  Surgeon: Lollie Sails, MD;  Location: [facility] ENDOSCOPY;  Service: Endoscopy;  Laterality: N/A;  . left arm surgery     Family History:  Family History  Problem Relation Age of Onset  . Prostate cancer Neg Hx   . Bladder Cancer Neg Hx    Family Psychiatric  History: none Social History:  Social History   Substance and Sexual Activity  Alcohol Use Yes  . Alcohol/week: 0.0  standard drinks   Comment: beer     Social History   Substance and Sexual Activity  Drug Use Yes  . Types: Marijuana   Comment: marjuana    Social History   Socioeconomic History  . Marital status: Single    Spouse name: Not on file  . Number of children: Not on file  . Years of education: Not on file  . Highest education level: Not on file  Occupational History  . Not on file  Social Needs  . Financial resource strain: Not on file  . Food insecurity    Worry: Not on file    Inability: Not on file  . Transportation needs    Medical: Not on file    Non-medical: Not on file  Tobacco Use  . Smoking status: Former Smoker    Packs/day: 1.00    Types: Cigarettes  . Smokeless tobacco: Never Used  Substance and Sexual Activity  . Alcohol use: Yes    Alcohol/week: 0.0 standard drinks    Comment: beer  . Drug use: Yes    Types: Marijuana    Comment: marjuana  . Sexual activity: Not on file  Lifestyle  . Physical activity    Days per week: Not on file    Minutes per session: Not on file  . Stress: Not on file  Relationships  . Social Herbalist on phone: Not on file    Gets together: Not on file    Attends religious service: Not on file    Active member of club or organization: Not on file  Attends meetings of clubs or organizations: Not on file    Relationship status: Not on file  Other Topics Concern  . Not on file  Social History Narrative  . Not on file    Has this patient used any form of tobacco in the last 30 days? (Cigarettes, Smokeless Tobacco, Cigars, and/or Pipes) NA  Current Medications: Current Facility-Administered Medications  Medication Dose Route Frequency Provider Last Rate Last Dose  . amLODipine (NORVASC) tablet 2.5 mg  2.5 mg Oral Daily Merlyn Lot, MD   2.5 mg at 09/26/18 1047  . haloperidol (HALDOL) tablet 2.5 mg  2.5 mg Oral Daily Merlyn Lot, MD   2.5 mg at 09/26/18 1047  . LORazepam (ATIVAN) tablet 1 mg  1 mg  Oral Q6H PRN Merlyn Lot, MD   1 mg at 09/26/18 0113  . metoprolol tartrate (LOPRESSOR) tablet 50 mg  50 mg Oral BID Merlyn Lot, MD   50 mg at 09/26/18 1047  . QUEtiapine (SEROQUEL XR) 24 hr tablet 400 mg  400 mg Oral QHS Merlyn Lot, MD   400 mg at 09/25/18 2234  . traZODone (DESYREL) tablet 50 mg  50 mg Oral QHS Merlyn Lot, MD   50 mg at 09/25/18 2110   Current Outpatient Medications  Medication Sig Dispense Refill  . acetaminophen (TYLENOL) 650 MG CR tablet Take 650 mg by mouth every 6 (six) hours as needed for pain or fever.    Marland Kitchen albuterol (PROVENTIL) (2.5 MG/3ML) 0.083% nebulizer solution Take 2.5 mg by nebulization every 4 (four) hours as needed for wheezing or shortness of breath.    Marland Kitchen albuterol (VENTOLIN HFA) 108 (90 Base) MCG/ACT inhaler Inhale 1 puff into the lungs every 6 (six) hours as needed for wheezing or shortness of breath.    Marland Kitchen amLODipine (NORVASC) 2.5 MG tablet Take 1 tablet (2.5 mg total) by mouth daily. 30 tablet 0  . chlorthalidone (HYGROTON) 25 MG tablet Take 12.5 mg by mouth daily.    . divalproex (DEPAKOTE ER) 250 MG 24 hr tablet Take 1 tablet (250 mg total) by mouth 2 (two) times daily. 60 tablet 1  . docusate sodium (COLACE) 100 MG capsule Take 100 mg by mouth 2 (two) times daily.    . haloperidol (HALDOL) 0.5 MG tablet Take 5 tablets (2.5 mg total) by mouth daily. 15 tablet 0  . Ipratropium-Albuterol (COMBIVENT RESPIMAT) 20-100 MCG/ACT AERS respimat Inhale 1 puff into the lungs every 6 (six) hours.    Marland Kitchen LORazepam (ATIVAN) 1 MG tablet Take 1 tablet (1 mg total) by mouth every 6 (six) hours as needed for anxiety. 15 tablet 0  . metoprolol tartrate (LOPRESSOR) 50 MG tablet Take 1 tablet (50 mg total) by mouth 2 (two) times daily. 60 tablet 0  . QUEtiapine (SEROQUEL XR) 400 MG 24 hr tablet Take 400 mg by mouth at bedtime.    . traZODone (DESYREL) 50 MG tablet Take 50 mg by mouth at bedtime.    . trihexyphenidyl (ARTANE) 5 MG tablet Take 5 mg by  mouth 3 (three) times daily with meals.    Marland Kitchen umeclidinium-vilanterol (ANORO ELLIPTA) 62.5-25 MCG/INH AEPB Inhale 1 puff into the lungs daily.     PTA Medications: (Not in a hospital admission)   Musculoskeletal: Strength & Muscle Tone: within normal limits Gait & Station: normal Patient leans: N/A  Psychiatric Specialty Exam: Physical Exam  Nursing note and vitals reviewed. Constitutional: He appears well-developed and well-nourished.  HENT:  Head: Normocephalic.  Neck: Normal range of motion.  Respiratory: Effort normal.  Musculoskeletal: Normal range of motion.  Neurological: He is alert. No cranial nerve deficit.  Psychiatric: His speech is normal and behavior is normal. Judgment and thought content normal. His mood appears anxious. His affect is blunt. Cognition and memory are impaired.    Review of Systems  Psychiatric/Behavioral: Positive for memory loss. The patient is nervous/anxious.   All other systems reviewed and are negative.   Blood pressure (!) 135/98, pulse 74, temperature 98.2 F (36.8 C), temperature source Oral, resp. rate 16, SpO2 100 %.There is no height or weight on file to calculate BMI.  General Appearance: Casual  Eye Contact:  Good  Speech:  Difficult to understand at times r/t to no teeth  Volume:  Normal  Mood:  Anxious  Affect:  Flat  Thought Process:  Coherent and Descriptions of Associations: Intact  Orientation:  Full (Time, Place, and Person)  Thought Content:  WDL and Logical  Suicidal Thoughts:  No  Homicidal Thoughts:  No  Memory:  Immediate;   Fair Recent;   Fair Remote;   Fair  Judgement:  Fair  Insight:  Fair  Psychomotor Activity:  Normal  Concentration:  Concentration: Fair and Attention Span: Fair  Recall:  [employer] of Knowledge:  Fair  Language:  Fair  Akathisia:  No  Handed:  Right  AIMS (if indicated):     Assets:  Housing Leisure Time Resilience Social Support  ADL's:  Intact  Cognition:  WNL  Sleep:         Demographic Factors:  Male and Age 55 or older  Loss Factors: NA  Historical Factors: NA  Risk Reduction Factors:   Sense of responsibility to family, Living with another person, especially a relative, Positive social support and Positive therapeutic relationship  Continued Clinical Symptoms:  None   Cognitive Features That Contribute To Risk:  None    Suicide Risk:  Minimal: No identifiable suicidal ideation.  Patients presenting with no risk factors but with morbid ruminations; may be classified as minimal risk based on the severity of the depressive symptoms   Plan Of Care/Follow-up recommendations:  Schizophrenia, paranoid type: -Continue haldol 2.5 mg daily -Continue Seroquel 400 mg at bedtime  R/o dementia: -Neuro consult  Anxiety: -Continue Ativan 1 mg every six hours PRN  Insomnia: -Continue Trazodone 50 mg at bedtime Activity:  as tolerated Diet:  heart healthy diet  Disposition: discharge to group home Waylan Boga, NP 09/26/2018, 4:04 PM

## 2018-11-10 ENCOUNTER — Emergency Department: Payer: Medicare Other

## 2018-11-10 ENCOUNTER — Other Ambulatory Visit: Payer: Self-pay

## 2018-11-10 ENCOUNTER — Encounter: Payer: Self-pay | Admitting: Emergency Medicine

## 2018-11-10 ENCOUNTER — Emergency Department
Admission: EM | Admit: 2018-11-10 | Discharge: 2018-11-10 | Disposition: A | Discharge status: Home / Self Care | Payer: Medicare Other | Attending: Emergency Medicine | Admitting: Emergency Medicine

## 2018-11-10 DIAGNOSIS — I10 Essential (primary) hypertension: Secondary | ICD-10-CM | POA: Insufficient documentation

## 2018-11-10 DIAGNOSIS — J449 Chronic obstructive pulmonary disease, unspecified: Secondary | ICD-10-CM | POA: Diagnosis not present

## 2018-11-10 DIAGNOSIS — R2681 Unsteadiness on feet: Secondary | ICD-10-CM | POA: Insufficient documentation

## 2018-11-10 DIAGNOSIS — Z79899 Other long term (current) drug therapy: Secondary | ICD-10-CM | POA: Diagnosis not present

## 2018-11-10 DIAGNOSIS — Z87891 Personal history of nicotine dependence: Secondary | ICD-10-CM | POA: Diagnosis not present

## 2018-11-10 DIAGNOSIS — R0789 Other chest pain: Secondary | ICD-10-CM | POA: Diagnosis present

## 2018-11-10 DIAGNOSIS — F209 Schizophrenia, unspecified: Secondary | ICD-10-CM

## 2018-11-10 LAB — TSH: TSH: 1.707 u[IU]/mL (ref 0.350–4.500)

## 2018-11-10 LAB — CBC WITH DIFFERENTIAL/PLATELET
Abs Immature Granulocytes: 0.03 10*3/uL (ref 0.00–0.07)
Basophils Absolute: 0 10*3/uL (ref 0.0–0.1)
Basophils Relative: 0 %
Eosinophils Absolute: 0.1 10*3/uL (ref 0.0–0.5)
Eosinophils Relative: 1 %
HCT: 41.3 % (ref 39.0–52.0)
Hemoglobin: 13.6 g/dL (ref 13.0–17.0)
Immature Granulocytes: 1 %
Lymphocytes Relative: 27 %
Lymphs Abs: 1.6 10*3/uL (ref 0.7–4.0)
MCH: 29.8 pg (ref 26.0–34.0)
MCHC: 32.9 g/dL (ref 30.0–36.0)
MCV: 90.6 fL (ref 80.0–100.0)
Monocytes Absolute: 0.5 10*3/uL (ref 0.1–1.0)
Monocytes Relative: 9 %
Neutro Abs: 3.5 10*3/uL (ref 1.7–7.7)
Neutrophils Relative %: 62 %
Platelets: 223 10*3/uL (ref 150–400)
RBC: 4.56 MIL/uL (ref 4.22–5.81)
RDW: 13.2 % (ref 11.5–15.5)
WBC: 5.8 10*3/uL (ref 4.0–10.5)
nRBC: 0 % (ref 0.0–0.2)

## 2018-11-10 LAB — URINALYSIS, COMPLETE (UACMP) WITH MICROSCOPIC
Bacteria, UA: NONE SEEN
Bilirubin Urine: NEGATIVE
Glucose, UA: NEGATIVE mg/dL
Hgb urine dipstick: NEGATIVE
Ketones, ur: NEGATIVE mg/dL
Leukocytes,Ua: NEGATIVE
Nitrite: NEGATIVE
Protein, ur: NEGATIVE mg/dL
Specific Gravity, Urine: 1.01 (ref 1.005–1.030)
pH: 7 (ref 5.0–8.0)

## 2018-11-10 LAB — URINE DRUG SCREEN, QUALITATIVE (ARMC ONLY)
Amphetamines, Ur Screen: NOT DETECTED
Barbiturates, Ur Screen: NOT DETECTED
Benzodiazepine, Ur Scrn: NOT DETECTED
Cannabinoid 50 Ng, Ur ~~LOC~~: NOT DETECTED
Cocaine Metabolite,Ur ~~LOC~~: NOT DETECTED
MDMA (Ecstasy)Ur Screen: NOT DETECTED
Methadone Scn, Ur: NOT DETECTED
Opiate, Ur Screen: NOT DETECTED
Phencyclidine (PCP) Ur S: NOT DETECTED
Tricyclic, Ur Screen: POSITIVE — AB

## 2018-11-10 LAB — COMPREHENSIVE METABOLIC PANEL
ALT: 19 U/L (ref 0–44)
AST: 20 U/L (ref 15–41)
Albumin: 3.7 g/dL (ref 3.5–5.0)
Alkaline Phosphatase: 67 U/L (ref 38–126)
Anion gap: 9 (ref 5–15)
BUN: 10 mg/dL (ref 8–23)
CO2: 29 mmol/L (ref 22–32)
Calcium: 9.7 mg/dL (ref 8.9–10.3)
Chloride: 101 mmol/L (ref 98–111)
Creatinine, Ser: 0.95 mg/dL (ref 0.61–1.24)
GFR calc Af Amer: >60 mL/min (ref >60)
GFR calc non Af Amer: >60 mL/min (ref >60)
Glucose, Bld: 87 mg/dL (ref 70–99)
Potassium: 4 mmol/L (ref 3.5–5.1)
Sodium: 139 mmol/L (ref 135–145)
Total Bilirubin: 0.4 mg/dL (ref 0.3–1.2)
Total Protein: 6.9 g/dL (ref 6.5–8.1)

## 2018-11-10 LAB — TROPONIN I (HIGH SENSITIVITY): Troponin I (High Sensitivity): 5 ng/L (ref <18)

## 2018-11-10 NOTE — ED Notes (Signed)
Reports having bed dream, writer not able to understand verbage patient utilizing. States "something when into him in his dream". Patient reports does not usually have bad dreams, usually never remember any dream". States this dream woke him out of his sleep. Patient denies HV/SI/HI. 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

## 2018-11-10 NOTE — ED Notes (Signed)
Patient to ct head accompanied by ED tech

## 2018-11-10 NOTE — ED Notes (Signed)
PT  VOL  CONSULT  DONE

## 2018-11-10 NOTE — ED Notes (Signed)
3rd call to cab company eta now 45mins.

## 2018-11-10 NOTE — ED Notes (Signed)
2nd call to cab company eta 20 minutes.

## 2018-11-10 NOTE — ED Notes (Signed)
Pt dressed out into burgundy tshirt and blue paper scrub pants with this tech and Stephen,RN in the rm. Pt belongings consist of a gray jacket, white tennis shoes, blue sweat pants, a gray long sleeve shirt, a white tshirt, an orange lighter, white socks and gray boxers. Pt calm and cooperative while dressing out. Pt denies having any money, a wallet or cell phone with him.

## 2018-11-10 NOTE — ED Notes (Signed)
PT  VOL °

## 2018-11-10 NOTE — ED Notes (Signed)
Patient off unit to MRI.

## 2018-11-10 NOTE — ED Provider Notes (Signed)
[facility] Emergency Department Provider Note   ____________________________________________   First MD Initiated Contact with Patient 11/10/18 1511     (approximate)  I have reviewed the triage vital signs and the nursing notes.   HISTORY  Chief Complaint No chief complaint on file.    HPI Carlos Nelson is a 68 y.o. male who comes from group home apparently complaining of bad dreams.  He has very broken speech and tells me that he has been tripping over his feet for last day or so.  Psychiatry sees him if he also complained of needing to see a psychiatrist.  Psychiatry called his group home and found out that his chief complaint actually was chest pain which he told the triage nurse but not made.  In fact he denied any chest pain or shortness of breath.  He had also complained of some other things previously.  Psychiatry felt he could go back home if he ruled out.  I talked to neurology about his speech and his complaint of trouble walking and Dr. Doy Mince asked for an MRI of the brain which we will get.  She felt that the MRI was okay he could go home.  Per psychiatry and his caregivers he is otherwise at his baseline.      Past Medical History:  Diagnosis Date   COPD (chronic obstructive pulmonary disease) ([place])    Hypertension    Schizophrenia ([place])     Patient Active Problem List   Diagnosis Date Noted   Altered mental status 09/25/2018   Schizophrenia ([place])    Tachycardia 08/21/2018   Schizophrenia, chronic with acute exacerbation ([place]) 08/17/2018   Elevated PSA 05/24/2015    Past Surgical History:  Procedure Laterality Date   COLONOSCOPY     COLONOSCOPY WITH PROPOFOL N/A 12/13/2016   Procedure: COLONOSCOPY WITH PROPOFOL;  Surgeon: Lollie Sails, MD;  Location: [facility] ENDOSCOPY;  Service: Endoscopy;  Laterality: N/A;   ESOPHAGOGASTRODUODENOSCOPY (EGD) WITH PROPOFOL N/A 12/13/2016   Procedure: ESOPHAGOGASTRODUODENOSCOPY  (EGD) WITH PROPOFOL;  Surgeon: Lollie Sails, MD;  Location: [facility] ENDOSCOPY;  Service: Endoscopy;  Laterality: N/A;   left arm surgery      Prior to Admission medications   Medication Sig Start Date End Date Taking? Authorizing Provider  amLODipine (NORVASC) 2.5 MG tablet Take 1 tablet (2.5 mg total) by mouth daily. 08/22/18  Yes Gouru, Illene Silver, MD  chlorthalidone (HYGROTON) 25 MG tablet Take 12.5 mg by mouth daily.   Yes [provider]  divalproex (DEPAKOTE ER) 250 MG 24 hr tablet Take 1 tablet (250 mg total) by mouth 2 (two) times daily. 08/17/18  Yes Patrecia Pour, NP  docusate sodium (COLACE) 100 MG capsule Take 100 mg by mouth 2 (two) times daily.   Yes [provider]  haloperidol (HALDOL) 0.5 MG tablet Take 5 tablets (2.5 mg total) by mouth daily. 08/23/18  Yes Gouru, Aruna, MD  Ipratropium-Albuterol (COMBIVENT RESPIMAT) 20-100 MCG/ACT AERS respimat Inhale 1 puff into the lungs every 6 (six) hours.   Yes [provider]  LORazepam (ATIVAN) 1 MG tablet Take 1 tablet (1 mg total) by mouth every 6 (six) hours as needed for anxiety. 08/22/18  Yes Gouru, Illene Silver, MD  metoprolol tartrate (LOPRESSOR) 50 MG tablet Take 1 tablet (50 mg total) by mouth 2 (two) times daily. 08/22/18  Yes Gouru, Illene Silver, MD  QUEtiapine (SEROQUEL XR) 400 MG 24 hr tablet Take 400 mg by mouth at bedtime.   Yes [provider]  traZODone (DESYREL) 50 MG tablet Take 50 mg by mouth at bedtime.   Yes [provider]  trihexyphenidyl (ARTANE) 5 MG tablet Take 5 mg by mouth 3 (three) times daily with meals.   Yes [provider]  umeclidinium-vilanterol (ANORO ELLIPTA) 62.5-25 MCG/INH AEPB Inhale 1 puff into the lungs daily.   Yes [provider]  acetaminophen (TYLENOL) 650 MG CR tablet Take 650 mg by mouth every 6 (six) hours as needed for pain or fever.    [provider]  albuterol (PROVENTIL) (2.5 MG/3ML) 0.083% nebulizer solution Take 2.5 mg by nebulization  every 4 (four) hours as needed for wheezing or shortness of breath.    [provider]  albuterol (VENTOLIN HFA) 108 (90 Base) MCG/ACT inhaler Inhale 1 puff into the lungs every 6 (six) hours as needed for wheezing or shortness of breath.    [provider]    Allergies Ace inhibitors  Family History  Problem Relation Age of Onset   Prostate cancer Neg Hx    Bladder Cancer Neg Hx     Social History Social History   Tobacco Use   Smoking status: Former Smoker    Packs/day: 1.00    Types: Cigarettes   Smokeless tobacco: Never Used  Substance Use Topics   Alcohol use: Yes    Alcohol/week: 0.0 standard drinks    Comment: beer   Drug use: Yes    Types: Marijuana    Comment: marjuana    Review of Systems  Constitutional: No fever/chills Eyes: No visual changes. ENT: No sore throat. Cardiovascular: Denies chest pain. Respiratory: Denies shortness of breath. Gastrointestinal: No abdominal pain.  No nausea, no vomiting.  No diarrhea.  No constipation. Genitourinary: Negative for dysuria. Musculoskeletal: Negative for back pain. Skin: Negative for rash. Neurological: See HPI  ____________________________________________   PHYSICAL EXAM:  VITAL SIGNS: ED Triage Vitals  Enc Vitals Group     BP 11/10/18 1449 (!) 143/91     Pulse Rate 11/10/18 1449 97     Resp 11/10/18 1449 18     Temp 11/10/18 1449 97.8 F (36.6 C)     Temp Source 11/10/18 1449 Oral     SpO2 11/10/18 1449 95 %     Weight 11/10/18 1445 120 lb (54.4 kg)     Height 11/10/18 1445 5\' 4"  (1.626 m)     Head Circumference --      Peak Flow --      Pain Score 11/10/18 1445 0     Pain Loc --      Pain Edu? --      Excl. in [place]? --    Constitutional: Alert and oriented. Well appearing and in no acute distress. Eyes: Conjunctivae are normal. PER. EOMI. Head: Atraumatic. Nose: No congestion/rhinnorhea. Mouth/Throat: Mucous membranes are moist.  Oropharynx non-erythematous. Neck: No  stridor.  Cardiovascular: Normal rate, regular rhythm. Grossly normal heart sounds.  Good peripheral circulation. Respiratory: Normal respiratory effort.  No retractions. Lungs CTAB. Gastrointestinal: Soft and nontender. No distention. No abdominal bruits. No CVA tenderness. Musculoskeletal: No lower extremity tenderness nor edema.  Neurologic: Speech is somewhat broken and hard to understand.  Patient has difficulty understanding and following commands but I cannot find any evidence of cerebellar ataxia motor weakness or numbness. Skin:  Skin is warm, dry and intact. No rash noted.   ____________________________________________   LABS (all labs ordered are listed, but only abnormal results are displayed)  Labs Reviewed  URINALYSIS, COMPLETE (UACMP) WITH MICROSCOPIC -  Abnormal; Notable for the following components:      Result Value   Color, Urine STRAW (*)    APPearance CLEAR (*)    All other components within normal limits  URINE DRUG SCREEN, QUALITATIVE ([facility] ONLY) - Abnormal; Notable for the following components:   Tricyclic, Ur Screen POSITIVE (*)    All other components within normal limits  COMPREHENSIVE METABOLIC PANEL  CBC WITH DIFFERENTIAL/PLATELET  TSH  FOLATE RBC  VITAMIN B12  TROPONIN I (HIGH SENSITIVITY)  TROPONIN I (HIGH SENSITIVITY)   ____________________________________________  EKG  EKG read interpreted by me shows normal sinus rhythm rate of 93 slight rightward axis otherwise no acute changes ____________________________________________  RADIOLOGY  ED MD interpretation CT of the head read by radiology as no acute disease.  I reviewed the films. MRI of the head read by radiology as no acute disease Official radiology report(s): Dg Chest 1 View  Result Date: 11/10/2018 CLINICAL DATA:  Chest pain EXAM: CHEST  1 VIEW COMPARISON:  Radiograph 09/25/2018 CT 01/31/2012 FINDINGS: Chronic emphysematous features better seen on comparison cross-sectional imaging.  No consolidation, features of edema, pneumothorax, or effusion. Pulmonary vascularity is normally distributed. The cardiomediastinal contours are unremarkable. No acute osseous or soft tissue abnormality. IMPRESSION: No acute cardiopulmonary abnormality. Emphysematous features, better assessed on remote cross-sectional comparison. Electronically Signed   By: Lovena Le M.D.   On: 11/10/2018 18:32   Ct Head Wo Contrast  Result Date: 11/10/2018 CLINICAL DATA:  Clumsiness with legs, difficulty with speech, history COPD, hypertension, schizophrenia EXAM: CT HEAD WITHOUT CONTRAST TECHNIQUE: Contiguous axial images were obtained from the base of the skull through the vertex without intravenous contrast. Sagittal and coronal MPR images reconstructed from axial data set. COMPARISON:  09/25/2018 FINDINGS: Brain: Mild generalized atrophy. Normal ventricular morphology. No midline shift or mass effect. Minimal small vessel chronic ischemic changes of deep cerebral white matter. No intracranial hemorrhage, mass lesion, or evidence of acute infarction. No extra-axial fluid collections. Vascular: Mild atherosclerotic calcification of internal carotid arteries at skull base. No hyperdense vessels. Skull: Intact Sinuses/Orbits: Clear Other: N/A IMPRESSION: Atrophy with minimal small vessel chronic ischemic changes of deep cerebral white matter. No acute intracranial abnormalities. Electronically Signed   By: Lavonia Dana M.D.   On: 11/10/2018 17:02   Mr Brain Wo Contrast  Result Date: 11/10/2018 CLINICAL DATA:  Auditory hallucinations. Mental status changes. Dizziness. EXAM: MRI HEAD WITHOUT CONTRAST TECHNIQUE: Multiplanar, multiecho pulse sequences of the brain and surrounding structures were obtained without intravenous contrast. COMPARISON:  Head CT same day FINDINGS: Brain: Diffusion imaging does not show any acute or subacute infarction. The brainstem and cerebellum appear normal. Cerebral hemispheres show moderate  generalized volume loss with moderate chronic small-vessel ischemic change of the white matter. No cortical or large vessel territory infarction. No mass lesion, hemorrhage, hydrocephalus or extra-axial collection. Vascular: Major vessels at the base of the brain show flow. Skull and upper cervical spine: Negative Sinuses/Orbits: Clear/normal Other: None IMPRESSION: No acute finding. Moderate cerebral atrophy and moderate chronic small-vessel change of the cerebral hemispheric white matter. Electronically Signed   By: Nelson Chimes M.D.   On: 11/10/2018 18:36    ____________________________________________   PROCEDURES  Procedure(s) performed (including Critical Care):  Procedures   ____________________________________________   INITIAL IMPRESSION / ASSESSMENT AND PLAN / ED COURSE  On discharge patient is awake alert feels well looks well and is waiting for his ride. ISIAS WEIDEMAN was evaluated in Emergency Department on 11/10/2018 for the symptoms described in  the history of present illness. He was evaluated in the context of the global COVID-19 pandemic, which necessitated consideration that the patient might be at risk for infection with the SARS-CoV-2 virus that causes COVID-19. Institutional protocols and algorithms that pertain to the evaluation of patients at risk for COVID-19 are in a state of rapid change based on information released by regulatory bodies including the CDC and federal and state organizations. These policies and algorithms were followed during the patient's care in the ED.            ____________________________________________   FINAL CLINICAL IMPRESSION(S) / ED DIAGNOSES  Final diagnoses:  Schizophrenia, unspecified type [facility])     ED Discharge Orders    None       Note:  This document was prepared using Dragon voice recognition software and may include unintentional dictation errors.    Nena Polio, MD 11/10/18 [id number]

## 2018-11-10 NOTE — ED Notes (Signed)
Patient discharged to home

## 2018-11-10 NOTE — ED Notes (Signed)
CT completed

## 2018-11-10 NOTE — ED Notes (Signed)
Call received from group home unable to come and get patient. Requested patient get a cab back home. Cab vocher obtained and called at 2125.

## 2018-11-10 NOTE — ED Triage Notes (Signed)
Pt presents to ED via [facility] from [place]. This RN spoke with Angelena Sole [phone], administrator for [facility]. Per Tomi Bamberger pt complained to her that he was having chest pain, pt states to this RN that he was having bad dreams and is supposed to see a new doctor tomorrow. Pt states he came to ED today due to having "bad dreams". Pt denies active SI/HI at this time. Pt states he does want to see the psychiatrist. Pt denies active pain at this time.    Pt now denies wanting to see the psychiatrist because "I'm just going to see one tomorrow". Pt is however agreeable to see psychiatrist today.

## 2018-11-10 NOTE — Consult Note (Signed)
[place] Psychiatry Consult   Reason for Consult: Bad dreams/history of schizophrenia Referring Physician: Conni Slipper Patient Identification: Carlos Nelson MRN:  [id number] Principal Diagnosis: <principal problem not specified> Diagnosis:  Active Problems:   * No active hospital problems. *   Total Time spent with patient: 30 minutes  Subjective:   Carlos Nelson is a 68 y.o. male patient presented with various complaints including bad dreams and dizziness.  HPI: Is difficult to understand as he mumbles and speaks quickly.  Patient is able to deny any suicidal or homicidal ideation.  Patient denies being distressed by audio hallucinations although does acknowledge occasional hallucinations.  Patient states he had a bad dream last night which prompted him to come in.  Gave conflicting reports as he had told the emergency medical doctor that he had dizziness.  Patient denies any recent aggression or agitation and is calm and cooperative with Probation officer during the course of the evaluation.  Collateral was obtained from the group home, [facility] [phone].  Tomi Bamberger reports that patient requests to go to emergency room due to chest pain and difficulty breathing.  She states that patient is often difficult to understand and will mumble.  She denies that the patient has had any behavioral concerns or psychiatric complaints.  She states that patient has been in good behavioral control and is compliant with his daily medication.  States she is unsure what he means by having bad dreams but states that patient is awaiting to have an appointment with a dementia doctor tomorrow.  Patient also has an upcoming psychiatry appointment in the beginning of next month.  Psychiatrically she feels that patient is at his baseline.    Past Psychiatric History: Patient reports approximately 20 prior hospitalizations for psychiatric reasons.  He reports last one being a long long time ago.  Patient is  compliant with his psychiatric medication according to collateral.  Risk to Self:  No Risk to Others:  No Prior Inpatient Therapy:  Yes Prior Outpatient Therapy:  Yes  Past Medical History:  Past Medical History:  Diagnosis Date  . COPD (chronic obstructive pulmonary disease) ([place])   . Hypertension   . Schizophrenia [facility])     Past Surgical History:  Procedure Laterality Date  . COLONOSCOPY    . COLONOSCOPY WITH PROPOFOL N/A 12/13/2016   Procedure: COLONOSCOPY WITH PROPOFOL;  Surgeon: Lollie Sails, MD;  Location[facility] ENDOSCOPY;  Service: Endoscopy;  Laterality: N/A;  . ESOPHAGOGASTRODUODENOSCOPY (EGD) WITH PROPOFOL N/A 12/13/2016   Procedure: ESOPHAGOGASTRODUODENOSCOPY (EGD) WITH PROPOFOL;  Surgeon: Lollie Sails, MD;  Location: [facility] ENDOSCOPY;  Service: Endoscopy;  Laterality: N/A;  . left arm surgery     Family History:  Family History  Problem Relation Age of Onset  . Prostate cancer Neg Hx   . Bladder Cancer Neg Hx    Family Psychiatric  History: Unable to obtain Social History:  Social History   Substance and Sexual Activity  Alcohol Use Yes  . Alcohol/week: 0.0 standard drinks   Comment: beer     Social History   Substance and Sexual Activity  Drug Use Yes  . Types: Marijuana   Comment: marjuana    Social History   Socioeconomic History  . Marital status: Single    Spouse name: Not on file  . Number of children: Not on file  . Years of education: Not on file  . Highest education level: Not on file  Occupational History  . Not on file  Social  Needs  . Financial resource strain: Not on file  . Food insecurity    Worry: Not on file    Inability: Not on file  . Transportation needs    Medical: Not on file    Non-medical: Not on file  Tobacco Use  . Smoking status: Former Smoker    Packs/day: 1.00    Types: Cigarettes  . Smokeless tobacco: Never Used  Substance and Sexual Activity  . Alcohol use: Yes    Alcohol/week: 0.0 standard  drinks    Comment: beer  . Drug use: Yes    Types: Marijuana    Comment: marjuana  . Sexual activity: Not on file  Lifestyle  . Physical activity    Days per week: Not on file    Minutes per session: Not on file  . Stress: Not on file  Relationships  . Social Herbalist on phone: Not on file    Gets together: Not on file    Attends religious service: Not on file    Active member of club or organization: Not on file    Attends meetings of clubs or organizations: Not on file    Relationship status: Not on file  Other Topics Concern  . Not on file  Social History Narrative  . Not on file   Additional Social History: [facility].    Allergies:   Allergies  Allergen Reactions  . Ace Inhibitors Swelling    Labs:  Results for orders placed or performed during the hospital encounter of 11/10/18 (from the past 48 hour(s))  Comprehensive metabolic panel     Status: None   Collection Time: 11/10/18  2:51 PM  Result Value Ref Range   Sodium 139 135 - 145 mmol/L   Potassium 4.0 3.5 - 5.1 mmol/L   Chloride 101 98 - 111 mmol/L   CO2 29 22 - 32 mmol/L   Glucose, Bld 87 70 - 99 mg/dL   BUN 10 8 - 23 mg/dL   Creatinine, Ser 0.95 0.61 - 1.24 mg/dL   Calcium 9.7 8.9 - 10.3 mg/dL   Total Protein 6.9 6.5 - 8.1 g/dL   Albumin 3.7 3.5 - 5.0 g/dL   AST 20 15 - 41 U/L   ALT 19 0 - 44 U/L   Alkaline Phosphatase 67 38 - 126 U/L   Total Bilirubin 0.4 0.3 - 1.2 mg/dL   GFR calc non Af Amer >60 >60 mL/min   GFR calc Af Amer >60 >60 mL/min   Anion gap 9 5 - 15    Comment: Performed at [facility] [address] [place], [place] [address]  CBC with Differential     Status: None   Collection Time: 11/10/18  2:51 PM  Result Value Ref Range   WBC 5.8 4.0 - 10.5 K/uL   RBC 4.56 4.22 - 5.81 MIL/uL   Hemoglobin 13.6 13.0 - 17.0 g/dL   HCT 41.3 39.0 - 52.0 %   MCV 90.6 80.0 - 100.0 fL   MCH 29.8 26.0 - 34.0 pg   MCHC 32.9 30.0 - 36.0  g/dL   RDW 13.2 11.5 - 15.5 %   Platelets 223 150 - 400 K/uL   nRBC 0.0 0.0 - 0.2 %   Neutrophils Relative % 62 %   Neutro Abs 3.5 1.7 - 7.7 K/uL   Lymphocytes Relative 27 %   Lymphs Abs 1.6 0.7 - 4.0 K/uL   Monocytes Relative 9 %  Monocytes Absolute 0.5 0.1 - 1.0 K/uL   Eosinophils Relative 1 %   Eosinophils Absolute 0.1 0.0 - 0.5 K/uL   Basophils Relative 0 %   Basophils Absolute 0.0 0.0 - 0.1 K/uL   Immature Granulocytes 1 %   Abs Immature Granulocytes 0.03 0.00 - 0.07 K/uL    Comment: Performed at [facility], [address] [place], [place] [address]  Urinalysis, Complete w Microscopic     Status: Abnormal   Collection Time: 11/10/18  2:51 PM  Result Value Ref Range   Color, Urine STRAW (A) YELLOW   APPearance CLEAR (A) CLEAR   Specific Gravity, Urine 1.010 1.005 - 1.030   pH 7.0 5.0 - 8.0   Glucose, UA NEGATIVE NEGATIVE mg/dL   Hgb urine dipstick NEGATIVE NEGATIVE   Bilirubin Urine NEGATIVE NEGATIVE   Ketones, ur NEGATIVE NEGATIVE mg/dL   Protein, ur NEGATIVE NEGATIVE mg/dL   Nitrite NEGATIVE NEGATIVE   Leukocytes,Ua NEGATIVE NEGATIVE   WBC, UA 0-5 0 - 5 WBC/hpf   Bacteria, UA NONE SEEN NONE SEEN   Squamous Epithelial / LPF 0-5 0 - 5    Comment: Performed at [facility], [address], [place], [place] [address]  Urine Drug Screen, Qualitative     Status: Abnormal   Collection Time: 11/10/18  2:51 PM  Result Value Ref Range   Tricyclic, Ur Screen POSITIVE (A) NONE DETECTED   Amphetamines, Ur Screen NONE DETECTED NONE DETECTED   MDMA (Ecstasy)Ur Screen NONE DETECTED NONE DETECTED   Cocaine Metabolite,Ur [place] NONE DETECTED NONE DETECTED   Opiate, Ur Screen NONE DETECTED NONE DETECTED   Phencyclidine (PCP) Ur S NONE DETECTED NONE DETECTED   Cannabinoid 50 Ng, Ur [place] NONE DETECTED NONE DETECTED   Barbiturates, Ur Screen NONE DETECTED NONE DETECTED   Benzodiazepine, Ur Scrn NONE DETECTED NONE DETECTED   Methadone Scn, Ur NONE DETECTED NONE  DETECTED    Comment: (NOTE) Tricyclics + metabolites, urine    Cutoff 1000 ng/mL Amphetamines + metabolites, urine  Cutoff 1000 ng/mL MDMA (Ecstasy), urine              Cutoff 500 ng/mL Cocaine Metabolite, urine          Cutoff 300 ng/mL Opiate + metabolites, urine        Cutoff 300 ng/mL Phencyclidine (PCP), urine         Cutoff 25 ng/mL Cannabinoid, urine                 Cutoff 50 ng/mL Barbiturates + metabolites, urine  Cutoff 200 ng/mL Benzodiazepine, urine              Cutoff 200 ng/mL Methadone, urine                   Cutoff 300 ng/mL The urine drug screen provides only a preliminary, unconfirmed analytical test result and should not be used for non-medical purposes. Clinical consideration and professional judgment should be applied to any positive drug screen result due to possible interfering substances. A more specific alternate chemical method must be used in order to obtain a confirmed analytical result. Gas chromatography / mass spectrometry (GC/MS) is the preferred confirmat ory method. Performed at [facility], [address] [place],  [address]     No current facility-administered medications for this encounter.    Current Outpatient Medications  Medication Sig Dispense Refill  . acetaminophen (TYLENOL) 650 MG CR tablet Take 650 mg by mouth every 6 (six)  hours as needed for pain or fever.    Marland Kitchen albuterol (PROVENTIL) (2.5 MG/3ML) 0.083% nebulizer solution Take 2.5 mg by nebulization every 4 (four) hours as needed for wheezing or shortness of breath.    Marland Kitchen albuterol (VENTOLIN HFA) 108 (90 Base) MCG/ACT inhaler Inhale 1 puff into the lungs every 6 (six) hours as needed for wheezing or shortness of breath.    Marland Kitchen amLODipine (NORVASC) 2.5 MG tablet Take 1 tablet (2.5 mg total) by mouth daily. 30 tablet 0  . chlorthalidone (HYGROTON) 25 MG tablet Take 12.5 mg by mouth daily.    . divalproex (DEPAKOTE ER) 250 MG 24 hr tablet Take 1 tablet (250 mg total) by  mouth 2 (two) times daily. 60 tablet 1  . docusate sodium (COLACE) 100 MG capsule Take 100 mg by mouth 2 (two) times daily.    . haloperidol (HALDOL) 0.5 MG tablet Take 5 tablets (2.5 mg total) by mouth daily. 15 tablet 0  . Ipratropium-Albuterol (COMBIVENT RESPIMAT) 20-100 MCG/ACT AERS respimat Inhale 1 puff into the lungs every 6 (six) hours.    Marland Kitchen LORazepam (ATIVAN) 1 MG tablet Take 1 tablet (1 mg total) by mouth every 6 (six) hours as needed for anxiety. 15 tablet 0  . metoprolol tartrate (LOPRESSOR) 50 MG tablet Take 1 tablet (50 mg total) by mouth 2 (two) times daily. 60 tablet 0  . QUEtiapine (SEROQUEL XR) 400 MG 24 hr tablet Take 400 mg by mouth at bedtime.    . traZODone (DESYREL) 50 MG tablet Take 50 mg by mouth at bedtime.    . trihexyphenidyl (ARTANE) 5 MG tablet Take 5 mg by mouth 3 (three) times daily with meals.    Marland Kitchen umeclidinium-vilanterol (ANORO ELLIPTA) 62.5-25 MCG/INH AEPB Inhale 1 puff into the lungs daily.      Musculoskeletal: Strength & Muscle Tone: within normal limits Gait & Station: normal Patient leans: N/A  Psychiatric Specialty Exam: Physical Exam  Review of Systems  Constitutional: Negative for fever.  HENT: Negative for hearing loss.   Eyes: Negative for blurred vision.  Cardiovascular: Negative for leg swelling.  Skin: Negative for itching.    Blood pressure (!) 143/91, pulse 97, temperature 97.8 F (36.6 C), temperature source Oral, resp. rate 18, height 5\' 4"  (1.626 m), weight 54.4 kg, SpO2 95 %.Body mass index is 20.6 kg/m.  General Appearance: Fairly Groomed  Eye Contact:  Fair  Speech:  Garbled and Slow  Volume:  Decreased  Mood:  Euthymic  Affect:  Full Range  Thought Process:  Linear  Orientation:  Negative  Thought Content:  Logical  Suicidal Thoughts:  No  Homicidal Thoughts:  No  Memory:  Immediate;   Fair  Judgement:  Fair  Insight:  Shallow  Psychomotor Activity:  Normal  Concentration:  Concentration: Fair  Recall:  Poor  Fund  of Knowledge:  Poor  Language:  Fair  Akathisia:  No  Handed:  Right  AIMS (if indicated):     Assets:  Desire for Improvement Housing Resilience  ADL's:  Impaired  Cognition:  Impaired,  Moderate  Sleep:        Treatment Plan Summary:  Assessment: 68 year old male with history of schizophrenia who presents with odd somatic complaints as well as psychiatric complaint of bad dreams.  Patient is difficult to understand and presents oddly.  However collateral from the group home supported that the patient is most likely at baseline at this time.  Patient has an upcoming appointment for dementia work-up.  In light  of patient being in good behavioral control and compliant with medications he is deemed safe for discharge back to the group home.  Diagnosis: Schizophrenia  Disposition: No evidence of imminent risk to self or others at present.   Patient does not meet criteria for psychiatric inpatient admission. Discussed crisis plan, support from social network, calling 911, coming to the Emergency Department, and calling Suicide Hotline.  Dixie Dials, MD 11/10/2018 4:41 PM

## 2018-11-10 NOTE — ED Notes (Signed)
[facility] Called at [phone] as per care taker at facility will arrange to have patient picked up

## 2018-11-10 NOTE — ED Notes (Signed)
Dinner provided.

## 2018-11-10 NOTE — Discharge Instructions (Addendum)
Please follow-up with his care providers as planned and return for any further problems.  Lab tests etc. show no evidence of any heart attack.

## 2018-11-11 LAB — VITAMIN B12: Vitamin B-12: 242 pg/mL (ref 180–914)

## 2018-11-14 LAB — FOLATE RBC
Folate, Hemolysate: 391 ng/mL
Folate, RBC: 927 ng/mL (ref >498)
Hematocrit: 42.2 % (ref 37.5–51.0)

## 2019-01-09 ENCOUNTER — Other Ambulatory Visit: Payer: Self-pay

## 2019-01-09 DIAGNOSIS — I10 Essential (primary) hypertension: Secondary | ICD-10-CM | POA: Diagnosis not present

## 2019-01-09 DIAGNOSIS — Z87891 Personal history of nicotine dependence: Secondary | ICD-10-CM | POA: Insufficient documentation

## 2019-01-09 DIAGNOSIS — J449 Chronic obstructive pulmonary disease, unspecified: Secondary | ICD-10-CM | POA: Insufficient documentation

## 2019-01-09 DIAGNOSIS — Z79899 Other long term (current) drug therapy: Secondary | ICD-10-CM | POA: Insufficient documentation

## 2019-01-09 DIAGNOSIS — F419 Anxiety disorder, unspecified: Secondary | ICD-10-CM | POA: Diagnosis present

## 2019-01-09 NOTE — ED Notes (Signed)
First Nurse Note:  Pt in via [facility] from [facility]; per EMS, call out for headache which patient denied upon EMS arrival to scene.  Care Home staff requested he still be evaluated.    NAD noted at this time.

## 2019-01-09 NOTE — ED Triage Notes (Signed)
Pt arrives to ED via [facility] from [facility] facility with c/o anxiety and "nervousness", but cannot explain further or provide more details. Pt denies c/o CP or SHOB. No fever; no N/V/D. Pt is A&O, in NAD, ambulatory with steady gait; RR even, regular and unlabored.

## 2019-01-10 ENCOUNTER — Emergency Department
Admission: EM | Admit: 2019-01-10 | Discharge: 2019-01-10 | Disposition: A | Discharge status: Home / Self Care | Payer: Medicare Other | Attending: Student in an Organized Health Care Education/Training Program | Admitting: Student in an Organized Health Care Education/Training Program

## 2019-01-10 DIAGNOSIS — F419 Anxiety disorder, unspecified: Secondary | ICD-10-CM

## 2019-01-10 LAB — CBC WITH DIFFERENTIAL/PLATELET
Abs Immature Granulocytes: 0.03 10*3/uL (ref 0.00–0.07)
Basophils Absolute: 0 10*3/uL (ref 0.0–0.1)
Basophils Relative: 0 %
Eosinophils Absolute: 0.1 10*3/uL (ref 0.0–0.5)
Eosinophils Relative: 1 %
HCT: 43.9 % (ref 39.0–52.0)
Hemoglobin: 15.1 g/dL (ref 13.0–17.0)
Immature Granulocytes: 0 %
Lymphocytes Relative: 24 %
Lymphs Abs: 2.1 10*3/uL (ref 0.7–4.0)
MCH: 29.3 pg (ref 26.0–34.0)
MCHC: 34.4 g/dL (ref 30.0–36.0)
MCV: 85.1 fL (ref 80.0–100.0)
Monocytes Absolute: 1.1 10*3/uL — ABNORMAL HIGH (ref 0.1–1.0)
Monocytes Relative: 12 %
Neutro Abs: 5.4 10*3/uL (ref 1.7–7.7)
Neutrophils Relative %: 63 %
Platelets: 204 10*3/uL (ref 150–400)
RBC: 5.16 MIL/uL (ref 4.22–5.81)
RDW: 13.2 % (ref 11.5–15.5)
WBC: 8.7 10*3/uL (ref 4.0–10.5)
nRBC: 0 % (ref 0.0–0.2)

## 2019-01-10 LAB — COMPREHENSIVE METABOLIC PANEL
ALT: 25 U/L (ref 0–44)
AST: 21 U/L (ref 15–41)
Albumin: 4.1 g/dL (ref 3.5–5.0)
Alkaline Phosphatase: 98 U/L (ref 38–126)
Anion gap: 11 (ref 5–15)
BUN: 15 mg/dL (ref 8–23)
CO2: 27 mmol/L (ref 22–32)
Calcium: 9.7 mg/dL (ref 8.9–10.3)
Chloride: 104 mmol/L (ref 98–111)
Creatinine, Ser: 1.07 mg/dL (ref 0.61–1.24)
GFR calc Af Amer: >60 mL/min (ref >60)
GFR calc non Af Amer: >60 mL/min (ref >60)
Glucose, Bld: 119 mg/dL — ABNORMAL HIGH (ref 70–99)
Potassium: 4 mmol/L (ref 3.5–5.1)
Sodium: 142 mmol/L (ref 135–145)
Total Bilirubin: 0.6 mg/dL (ref 0.3–1.2)
Total Protein: 7.2 g/dL (ref 6.5–8.1)

## 2019-01-10 LAB — TROPONIN I (HIGH SENSITIVITY)
Troponin I (High Sensitivity): 22 ng/L — ABNORMAL HIGH (ref <18)
Troponin I (High Sensitivity): 22 ng/L — ABNORMAL HIGH (ref <18)

## 2019-01-10 MED ORDER — ASPIRIN 81 MG PO CHEW
324.0000 mg | CHEWABLE_TABLET | Freq: Once | ORAL | Status: AC
  Administered 2019-01-10: 324 mg via ORAL
  Filled 2019-01-10: qty 4

## 2019-01-10 NOTE — ED Notes (Signed)
No answer x3 at [facility] when called to give report and arrange for post-d/c transportation. [employer] notified and is sending an officer to the facility at this time to make them aware of their need to contact the ED to arrange d/c accomodations.

## 2019-01-10 NOTE — ED Notes (Signed)
Pt given cup of water- pt voices no other needs at this time

## 2019-01-10 NOTE — ED Provider Notes (Signed)
[facility] Emergency Department Provider Note    First MD Initiated Contact with Patient 01/10/19 0315     (approximate)  I have reviewed the triage vital signs and the nursing notes.   HISTORY  Chief Complaint Anxiety    HPI Carlos Nelson is a 68 y.o. male with the below listed past medical history presents the ER brief episode of feeling anxious and stated he was not happy in his nursing facility.  Denies any complaints at this time.  No chest pain or shortness of breath.  No nausea or vomiting.  Has no additional concerns.  Denies any SI or HI.    Past Medical History:  Diagnosis Date  . COPD (chronic obstructive pulmonary disease) ([place])   . Hypertension   . Schizophrenia ([place])    Family History  Problem Relation Age of Onset  . Prostate cancer Neg Hx   . Bladder Cancer Neg Hx    Past Surgical History:  Procedure Laterality Date  . COLONOSCOPY    . COLONOSCOPY WITH PROPOFOL N/A 12/13/2016   Procedure: COLONOSCOPY WITH PROPOFOL;  Surgeon: Lollie Sails, MD;  Location: [facility] ENDOSCOPY;  Service: Endoscopy;  Laterality: N/A;  . ESOPHAGOGASTRODUODENOSCOPY (EGD) WITH PROPOFOL N/A 12/13/2016   Procedure: ESOPHAGOGASTRODUODENOSCOPY (EGD) WITH PROPOFOL;  Surgeon: Lollie Sails, MD;  Location: [facility] ENDOSCOPY;  Service: Endoscopy;  Laterality: N/A;  . left arm surgery     Patient Active Problem List   Diagnosis Date Noted  . Altered mental status 09/25/2018  . Schizophrenia ([place])   . Tachycardia 08/21/2018  . Schizophrenia, chronic with acute exacerbation ([place]) 08/17/2018  . Elevated PSA 05/24/2015      Prior to Admission medications   Medication Sig Start Date End Date Taking? Authorizing Provider  acetaminophen (TYLENOL) 650 MG CR tablet Take 650 mg by mouth every 6 (six) hours as needed for pain or fever.    [provider]  albuterol (PROVENTIL) (2.5 MG/3ML) 0.083% nebulizer solution Take 2.5 mg by nebulization every  4 (four) hours as needed for wheezing or shortness of breath.    [provider]  albuterol (VENTOLIN HFA) 108 (90 Base) MCG/ACT inhaler Inhale 1 puff into the lungs every 6 (six) hours as needed for wheezing or shortness of breath.    [provider]  amLODipine (NORVASC) 2.5 MG tablet Take 1 tablet (2.5 mg total) by mouth daily. 08/22/18   Gouru, Illene Silver, MD  chlorthalidone (HYGROTON) 25 MG tablet Take 12.5 mg by mouth daily.    [provider]  divalproex (DEPAKOTE ER) 250 MG 24 hr tablet Take 1 tablet (250 mg total) by mouth 2 (two) times daily. 08/17/18   Patrecia Pour, NP  docusate sodium (COLACE) 100 MG capsule Take 100 mg by mouth 2 (two) times daily.    [provider]  haloperidol (HALDOL) 0.5 MG tablet Take 5 tablets (2.5 mg total) by mouth daily. 08/23/18   Gouru, Illene Silver, MD  Ipratropium-Albuterol (COMBIVENT RESPIMAT) 20-100 MCG/ACT AERS respimat Inhale 1 puff into the lungs every 6 (six) hours.    [provider]  LORazepam (ATIVAN) 1 MG tablet Take 1 tablet (1 mg total) by mouth every 6 (six) hours as needed for anxiety. 08/22/18   Nicholes Mango, MD  metoprolol tartrate (LOPRESSOR) 50 MG tablet Take 1 tablet (50 mg total) by mouth 2 (two) times daily. 08/22/18   Nicholes Mango, MD  QUEtiapine (SEROQUEL XR) 400 MG 24 hr tablet Take 400 mg by mouth at bedtime.  [provider]  traZODone (DESYREL) 50 MG tablet Take 50 mg by mouth at bedtime.    [provider]  trihexyphenidyl (ARTANE) 5 MG tablet Take 5 mg by mouth 3 (three) times daily with meals.    [provider]  umeclidinium-vilanterol (ANORO ELLIPTA) 62.5-25 MCG/INH AEPB Inhale 1 puff into the lungs daily.    [provider]    Allergies Ace inhibitors    Social History Social History   Tobacco Use  . Smoking status: Former Smoker    Packs/day: 1.00    Types: Cigarettes  . Smokeless tobacco: Never Used  Substance Use Topics  . Alcohol use: Yes     Alcohol/week: 0.0 standard drinks    Comment: beer  . Drug use: Yes    Types: Marijuana    Comment: marjuana    Review of Systems Patient denies headaches, rhinorrhea, blurry vision, numbness, shortness of breath, chest pain, edema, cough, abdominal pain, nausea, vomiting, diarrhea, dysuria, fevers, rashes or hallucinations unless otherwise stated above in HPI. ____________________________________________   PHYSICAL EXAM:  VITAL SIGNS: Vitals:   01/10/19 0340 01/10/19 0435  BP: (!) 159/103 (!) 159/96  Pulse: 98 99  Resp: 17 16  Temp:    SpO2: 97% 99%    Constitutional: Alert pleasant and in NAD Eyes: Conjunctivae are normal.  Head: Atraumatic. Nose: No congestion/rhinnorhea. Mouth/Throat: Mucous membranes are moist.   Neck: No stridor. Painless ROM.  Cardiovascular: Normal rate, regular rhythm. Grossly normal heart sounds.  Good peripheral circulation. Respiratory: Normal respiratory effort.  No retractions. Lungs CTAB. Gastrointestinal: Soft and nontender. No distention. No abdominal bruits. No CVA tenderness. Genitourinary:  Musculoskeletal: No lower extremity tenderness nor edema.  No joint effusions. Neurologic:  Normal speech and language. No gross focal neurologic deficits are appreciated. No facial droop Skin:  Skin is warm, dry and intact. No rash noted. Psychiatric: Mood and affect are normal. Speech and behavior are normal.  ____________________________________________   LABS (all labs ordered are listed, but only abnormal results are displayed)  Results for orders placed or performed during the hospital encounter of 01/10/19 (from the past 24 hour(s))  CBC with Differential     Status: Abnormal   Collection Time: 01/10/19  3:27 AM  Result Value Ref Range   WBC 8.7 4.0 - 10.5 K/uL   RBC 5.16 4.22 - 5.81 MIL/uL   Hemoglobin 15.1 13.0 - 17.0 g/dL   HCT 43.9 39.0 - 52.0 %   MCV 85.1 80.0 - 100.0 fL   MCH 29.3 26.0 - 34.0 pg   MCHC 34.4 30.0 - 36.0 g/dL    RDW 13.2 11.5 - 15.5 %   Platelets 204 150 - 400 K/uL   nRBC 0.0 0.0 - 0.2 %   Neutrophils Relative % 63 %   Neutro Abs 5.4 1.7 - 7.7 K/uL   Lymphocytes Relative 24 %   Lymphs Abs 2.1 0.7 - 4.0 K/uL   Monocytes Relative 12 %   Monocytes Absolute 1.1 (H) 0.1 - 1.0 K/uL   Eosinophils Relative 1 %   Eosinophils Absolute 0.1 0.0 - 0.5 K/uL   Basophils Relative 0 %   Basophils Absolute 0.0 0.0 - 0.1 K/uL   Immature Granulocytes 0 %   Abs Immature Granulocytes 0.03 0.00 - 0.07 K/uL  Comprehensive metabolic panel     Status: Abnormal   Collection Time: 01/10/19  3:27 AM  Result Value Ref Range   Sodium 142 135 - 145 mmol/L   Potassium 4.0 3.5 - 5.1  mmol/L   Chloride 104 98 - 111 mmol/L   CO2 27 22 - 32 mmol/L   Glucose, Bld 119 (H) 70 - 99 mg/dL   BUN 15 8 - 23 mg/dL   Creatinine, Ser 1.07 0.61 - 1.24 mg/dL   Calcium 9.7 8.9 - 10.3 mg/dL   Total Protein 7.2 6.5 - 8.1 g/dL   Albumin 4.1 3.5 - 5.0 g/dL   AST 21 15 - 41 U/L   ALT 25 0 - 44 U/L   Alkaline Phosphatase 98 38 - 126 U/L   Total Bilirubin 0.6 0.3 - 1.2 mg/dL   GFR calc non Af Amer >60 >60 mL/min   GFR calc Af Amer >60 >60 mL/min   Anion gap 11 5 - 15  Troponin I (High Sensitivity)     Status: Abnormal   Collection Time: 01/10/19  3:27 AM  Result Value Ref Range   Troponin I (High Sensitivity) 22 (H) <18 ng/L  Troponin I (High Sensitivity)     Status: Abnormal   Collection Time: 01/10/19  5:31 AM  Result Value Ref Range   Troponin I (High Sensitivity) 22 (H) <18 ng/L   ____________________________________________  EKG My review and personal interpretation at Time: 3:34   Indication: med screening  Rate: 95  Rhythm: sinus Axis: normal Other: normal intervals, no stemi ____________________________________________  RADIOLOGY  I personally reviewed all radiographic images ordered to evaluate for the above acute complaints and reviewed radiology reports and findings.  These findings were personally discussed with the  patient.  Please see medical record for radiology report.  ____________________________________________   PROCEDURES  Procedure(s) performed:  Procedures    Critical Care performed: no ____________________________________________   INITIAL IMPRESSION / ASSESSMENT AND PLAN / ED COURSE  Pertinent labs & imaging results that were available during my care of the patient were reviewed by me and considered in my medical decision making (see chart for details).   DDX: anxiety, electrolyte abn, anemia,   Carlos Nelson is a 69 y.o. who presents to the ED with complaint of anxiety but now does not have any complaints.  Screening blood work showed mildly elevated troponin without delta.  Is not complaining of any chest pain at this time.  Does not seem consistent with ACS.  No evidence of acute ischemia on his EKG.  He does have a history of schizophrenia but does not appear to be acutely paranoid no SI or HI.  Do not see any indication for IVC.  At this point I think he be appropriate for discharge back to facility.     The patient was evaluated in Emergency Department today for the symptoms described in the history of present illness. He/she was evaluated in the context of the global COVID-19 pandemic, which necessitated consideration that the patient might be at risk for infection with the SARS-CoV-2 virus that causes COVID-19. Institutional protocols and algorithms that pertain to the evaluation of patients at risk for COVID-19 are in a state of rapid change based on information released by regulatory bodies including the CDC and federal and state organizations. These policies and algorithms were followed during the patient's care in the ED.  As part of my medical decision making, I reviewed the following data within the [facility] notes reviewed and incorporated, Labs reviewed, notes from prior ED visits and [place] Controlled Substance  Database   ____________________________________________   FINAL CLINICAL IMPRESSION(S) / ED DIAGNOSES  Final diagnoses:  Anxiety  NEW MEDICATIONS STARTED DURING THIS VISIT:  New Prescriptions   No medications on file     Note:  This document was prepared using Dragon voice recognition software and may include unintentional dictation errors.    Merlyn Lot, MD 01/10/19 [phone]

## 2019-05-20 ENCOUNTER — Emergency Department: Payer: Medicare Other

## 2019-05-20 ENCOUNTER — Other Ambulatory Visit: Payer: Self-pay

## 2019-05-20 ENCOUNTER — Inpatient Hospital Stay
Admission: EM | Admit: 2019-05-20 | Discharge: 2019-05-22 | DRG: 193 | Disposition: A | Discharge status: Home / Self Care | Payer: Medicare Other | Attending: Internal Medicine | Admitting: Internal Medicine

## 2019-05-20 DIAGNOSIS — I10 Essential (primary) hypertension: Secondary | ICD-10-CM | POA: Diagnosis present

## 2019-05-20 DIAGNOSIS — J189 Pneumonia, unspecified organism: Secondary | ICD-10-CM | POA: Diagnosis present

## 2019-05-20 DIAGNOSIS — J440 Chronic obstructive pulmonary disease with acute lower respiratory infection: Secondary | ICD-10-CM | POA: Diagnosis present

## 2019-05-20 DIAGNOSIS — Z87891 Personal history of nicotine dependence: Secondary | ICD-10-CM | POA: Diagnosis not present

## 2019-05-20 DIAGNOSIS — J9621 Acute and chronic respiratory failure with hypoxia: Secondary | ICD-10-CM

## 2019-05-20 DIAGNOSIS — Z23 Encounter for immunization: Secondary | ICD-10-CM

## 2019-05-20 DIAGNOSIS — F209 Schizophrenia, unspecified: Secondary | ICD-10-CM | POA: Diagnosis present

## 2019-05-20 DIAGNOSIS — Z888 Allergy status to other drugs, medicaments and biological substances status: Secondary | ICD-10-CM | POA: Diagnosis not present

## 2019-05-20 DIAGNOSIS — Z79899 Other long term (current) drug therapy: Secondary | ICD-10-CM

## 2019-05-20 DIAGNOSIS — Z20822 Contact with and (suspected) exposure to covid-19: Secondary | ICD-10-CM | POA: Diagnosis present

## 2019-05-20 DIAGNOSIS — J9622 Acute and chronic respiratory failure with hypercapnia: Secondary | ICD-10-CM | POA: Diagnosis present

## 2019-05-20 DIAGNOSIS — J9601 Acute respiratory failure with hypoxia: Secondary | ICD-10-CM | POA: Diagnosis present

## 2019-05-20 DIAGNOSIS — J441 Chronic obstructive pulmonary disease with (acute) exacerbation: Secondary | ICD-10-CM | POA: Diagnosis present

## 2019-05-20 DIAGNOSIS — R0781 Pleurodynia: Secondary | ICD-10-CM | POA: Diagnosis present

## 2019-05-20 DIAGNOSIS — J9600 Acute respiratory failure, unspecified whether with hypoxia or hypercapnia: Secondary | ICD-10-CM | POA: Diagnosis present

## 2019-05-20 LAB — BLOOD GAS, ARTERIAL
Acid-Base Excess: 2.5 mmol/L — ABNORMAL HIGH (ref 0.0–2.0)
Acid-Base Excess: 4.3 mmol/L — ABNORMAL HIGH (ref 0.0–2.0)
Bicarbonate: 28.4 mmol/L — ABNORMAL HIGH (ref 20.0–28.0)
Bicarbonate: 29.8 mmol/L — ABNORMAL HIGH (ref 20.0–28.0)
Delivery systems: POSITIVE
Delivery systems: POSITIVE
Expiratory PAP: 5
Expiratory PAP: 6
FIO2: 0.3
FIO2: 0.3
Inspiratory PAP: 12
Inspiratory PAP: 12
O2 Saturation: 96.6 %
O2 Saturation: 97.4 %
Patient temperature: 37
Patient temperature: 37
pCO2 arterial: 48 mmHg (ref 32.0–48.0)
pCO2 arterial: 47 mmHg (ref 32.0–48.0)
pH, Arterial: 7.38 (ref 7.350–7.450)
pH, Arterial: 7.41 (ref 7.350–7.450)
pO2, Arterial: 88 mmHg (ref 83.0–108.0)
pO2, Arterial: 94 mmHg (ref 83.0–108.0)

## 2019-05-20 LAB — HEPATIC FUNCTION PANEL
ALT: 25 U/L (ref 0–44)
AST: 23 U/L (ref 15–41)
Albumin: 3.7 g/dL (ref 3.5–5.0)
Alkaline Phosphatase: 68 U/L (ref 38–126)
Bilirubin, Direct: 0.1 mg/dL (ref 0.0–0.2)
Indirect Bilirubin: 0.4 mg/dL (ref 0.3–0.9)
Total Bilirubin: 0.5 mg/dL (ref 0.3–1.2)
Total Protein: 6.7 g/dL (ref 6.5–8.1)

## 2019-05-20 LAB — BLOOD GAS, VENOUS
Acid-Base Excess: 4 mmol/L — ABNORMAL HIGH (ref 0.0–2.0)
Bicarbonate: 32.2 mmol/L — ABNORMAL HIGH (ref 20.0–28.0)
O2 Saturation: 43.4 %
Patient temperature: 37
pCO2, Ven: 64 mmHg — ABNORMAL HIGH (ref 44.0–60.0)
pH, Ven: 7.31 (ref 7.250–7.430)
pO2, Ven: <31 mmHg — CL (ref 32.0–45.0)

## 2019-05-20 LAB — CBC
HCT: 45.5 % (ref 39.0–52.0)
Hemoglobin: 15.4 g/dL (ref 13.0–17.0)
MCH: 30.2 pg (ref 26.0–34.0)
MCHC: 33.8 g/dL (ref 30.0–36.0)
MCV: 89.2 fL (ref 80.0–100.0)
Platelets: 207 10*3/uL (ref 150–400)
RBC: 5.1 MIL/uL (ref 4.22–5.81)
RDW: 13.4 % (ref 11.5–15.5)
WBC: 6.9 10*3/uL (ref 4.0–10.5)
nRBC: 0 % (ref 0.0–0.2)

## 2019-05-20 LAB — TROPONIN I (HIGH SENSITIVITY)
Troponin I (High Sensitivity): 7 ng/L (ref <18)
Troponin I (High Sensitivity): 7 ng/L (ref <18)
Troponin I (High Sensitivity): 6 ng/L (ref <18)

## 2019-05-20 LAB — BASIC METABOLIC PANEL
Anion gap: 9 (ref 5–15)
BUN: 16 mg/dL (ref 8–23)
CO2: 26 mmol/L (ref 22–32)
Calcium: 9 mg/dL (ref 8.9–10.3)
Chloride: 104 mmol/L (ref 98–111)
Creatinine, Ser: 1.13 mg/dL (ref 0.61–1.24)
GFR calc Af Amer: >60 mL/min (ref >60)
GFR calc non Af Amer: >60 mL/min (ref >60)
Glucose, Bld: 93 mg/dL (ref 70–99)
Potassium: 3.7 mmol/L (ref 3.5–5.1)
Sodium: 139 mmol/L (ref 135–145)

## 2019-05-20 LAB — HIV ANTIBODY (ROUTINE TESTING W REFLEX): HIV Screen 4th Generation wRfx: NONREACTIVE

## 2019-05-20 LAB — BRAIN NATRIURETIC PEPTIDE: B Natriuretic Peptide: 27 pg/mL (ref 0.0–100.0)

## 2019-05-20 LAB — LACTIC ACID, PLASMA: Lactic Acid, Venous: 1.4 mmol/L (ref 0.5–1.9)

## 2019-05-20 LAB — RESPIRATORY PANEL BY RT PCR (FLU A&B, COVID)
Influenza A by PCR: NEGATIVE
Influenza B by PCR: NEGATIVE
SARS Coronavirus 2 by RT PCR: NEGATIVE

## 2019-05-20 LAB — STREP PNEUMONIAE URINARY ANTIGEN: Strep Pneumo Urinary Antigen: NEGATIVE

## 2019-05-20 MED ORDER — QUETIAPINE FUMARATE 300 MG PO TABS
800.0000 mg | ORAL_TABLET | Freq: Every day | ORAL | Status: DC
  Administered 2019-05-20 – 2019-05-21 (×2): 800 mg via ORAL
  Filled 2019-05-20 (×3): qty 1

## 2019-05-20 MED ORDER — IPRATROPIUM-ALBUTEROL 0.5-2.5 (3) MG/3ML IN SOLN
3.0000 mL | Freq: Once | RESPIRATORY_TRACT | Status: AC
  Administered 2019-05-20: 05:00:00 3 mL via RESPIRATORY_TRACT
  Filled 2019-05-20: qty 3

## 2019-05-20 MED ORDER — LORAZEPAM 1 MG PO TABS: 1.0000 mg | ORAL_TABLET | Freq: Four times a day (QID) | ORAL | Status: DC | PRN

## 2019-05-20 MED ORDER — SODIUM CHLORIDE 0.9 % IV SOLN
500.0000 mg | INTRAVENOUS | Status: DC
  Administered 2019-05-21: 10:00:00 500 mg via INTRAVENOUS
  Filled 2019-05-20: qty 500

## 2019-05-20 MED ORDER — TRIHEXYPHENIDYL HCL 5 MG PO TABS
5.0000 mg | ORAL_TABLET | Freq: Three times a day (TID) | ORAL | Status: DC
  Administered 2019-05-20 – 2019-05-22 (×6): 5 mg via ORAL
  Filled 2019-05-20 (×9): qty 1

## 2019-05-20 MED ORDER — PNEUMOCOCCAL VAC POLYVALENT 25 MCG/0.5ML IJ INJ: 0.5000 mL | INJECTION | INTRAMUSCULAR | Status: DC

## 2019-05-20 MED ORDER — SODIUM CHLORIDE 0.9 % IV SOLN
2.0000 g | Freq: Once | INTRAVENOUS | Status: AC
  Administered 2019-05-20: 08:00:00 2 g via INTRAVENOUS
  Filled 2019-05-20: qty 20

## 2019-05-20 MED ORDER — AMLODIPINE BESYLATE 5 MG PO TABS
5.0000 mg | ORAL_TABLET | Freq: Every day | ORAL | Status: DC
  Administered 2019-05-21 – 2019-05-22 (×2): 5 mg via ORAL
  Filled 2019-05-20 (×2): qty 1

## 2019-05-20 MED ORDER — VITAMIN B-12 1000 MCG PO TABS
1000.0000 ug | ORAL_TABLET | Freq: Every day | ORAL | Status: DC
  Administered 2019-05-21 – 2019-05-22 (×2): 1000 ug via ORAL
  Filled 2019-05-20 (×2): qty 1

## 2019-05-20 MED ORDER — METHYLPREDNISOLONE SODIUM SUCC 40 MG IJ SOLR
40.0000 mg | Freq: Two times a day (BID) | INTRAMUSCULAR | Status: DC
  Administered 2019-05-20 – 2019-05-21 (×2): 40 mg via INTRAVENOUS
  Filled 2019-05-20 (×2): qty 1

## 2019-05-20 MED ORDER — DIVALPROEX SODIUM ER 250 MG PO TB24: 250.0000 mg | ORAL_TABLET | Freq: Two times a day (BID) | ORAL | Status: DC

## 2019-05-20 MED ORDER — HYDRALAZINE HCL 20 MG/ML IJ SOLN: 5.0000 mg | INTRAMUSCULAR | Status: DC | PRN

## 2019-05-20 MED ORDER — IPRATROPIUM-ALBUTEROL 0.5-2.5 (3) MG/3ML IN SOLN
3.0000 mL | RESPIRATORY_TRACT | Status: DC
  Administered 2019-05-20 – 2019-05-21 (×8): 3 mL via RESPIRATORY_TRACT
  Filled 2019-05-20 (×7): qty 3

## 2019-05-20 MED ORDER — SODIUM CHLORIDE 0.9 % IV SOLN
500.0000 mg | Freq: Once | INTRAVENOUS | Status: AC
  Administered 2019-05-20: 500 mg via INTRAVENOUS
  Filled 2019-05-20: qty 500

## 2019-05-20 MED ORDER — BENAZEPRIL HCL 20 MG PO TABS
20.0000 mg | ORAL_TABLET | Freq: Every day | ORAL | Status: DC
  Administered 2019-05-21 – 2019-05-22 (×2): 20 mg via ORAL
  Filled 2019-05-20 (×2): qty 1

## 2019-05-20 MED ORDER — DM-GUAIFENESIN ER 30-600 MG PO TB12
1.0000 | ORAL_TABLET | Freq: Two times a day (BID) | ORAL | Status: DC
  Administered 2019-05-20 – 2019-05-22 (×4): 1 via ORAL
  Filled 2019-05-20 (×4): qty 1

## 2019-05-20 MED ORDER — TRAZODONE HCL 100 MG PO TABS
300.0000 mg | ORAL_TABLET | Freq: Every day | ORAL | Status: DC
  Administered 2019-05-20 – 2019-05-21 (×2): 300 mg via ORAL
  Filled 2019-05-20 (×2): qty 3

## 2019-05-20 MED ORDER — SODIUM CHLORIDE 0.9 % IV SOLN
2.0000 g | INTRAVENOUS | Status: DC
  Administered 2019-05-21 – 2019-05-22 (×2): 2 g via INTRAVENOUS
  Filled 2019-05-20 (×2): qty 2
  Filled 2019-05-20: qty 20

## 2019-05-20 MED ORDER — HALOPERIDOL 2 MG PO TABS
2.5000 mg | ORAL_TABLET | Freq: Every day | ORAL | Status: DC
  Administered 2019-05-21 – 2019-05-22 (×2): 2.5 mg via ORAL
  Filled 2019-05-20 (×2): qty 1

## 2019-05-20 MED ORDER — HALOPERIDOL 5 MG PO TABS: 2.5000 mg | ORAL_TABLET | Freq: Every day | ORAL | Status: DC

## 2019-05-20 MED ORDER — AMLODIPINE BESY-BENAZEPRIL HCL 5-20 MG PO CAPS: 1.0000 | ORAL_CAPSULE | Freq: Every day | ORAL | Status: DC

## 2019-05-20 MED ORDER — TRIHEXYPHENIDYL HCL 5 MG PO TABS: 5.0000 mg | ORAL_TABLET | Freq: Three times a day (TID) | ORAL | Status: DC

## 2019-05-20 MED ORDER — HYDROXYZINE HCL 25 MG PO TABS: 50.0000 mg | ORAL_TABLET | Freq: Four times a day (QID) | ORAL | Status: DC | PRN

## 2019-05-20 MED ORDER — ONDANSETRON HCL 4 MG/2ML IJ SOLN: 4.0000 mg | Freq: Three times a day (TID) | INTRAMUSCULAR | Status: DC | PRN

## 2019-05-20 MED ORDER — DIVALPROEX SODIUM ER 500 MG PO TB24
500.0000 mg | ORAL_TABLET | Freq: Every day | ORAL | Status: DC
  Administered 2019-05-20 – 2019-05-21 (×2): 500 mg via ORAL
  Filled 2019-05-20 (×3): qty 1

## 2019-05-20 MED ORDER — IPRATROPIUM-ALBUTEROL 0.5-2.5 (3) MG/3ML IN SOLN
3.0000 mL | Freq: Once | RESPIRATORY_TRACT | Status: AC
  Administered 2019-05-20: 3 mL via RESPIRATORY_TRACT
  Filled 2019-05-20: qty 3

## 2019-05-20 MED ORDER — ACETAMINOPHEN 325 MG PO TABS: 650.0000 mg | ORAL_TABLET | Freq: Four times a day (QID) | ORAL | Status: DC | PRN

## 2019-05-20 MED ORDER — ENOXAPARIN SODIUM 40 MG/0.4ML ~~LOC~~ SOLN
40.0000 mg | SUBCUTANEOUS | Status: DC
  Administered 2019-05-20 – 2019-05-22 (×3): 40 mg via SUBCUTANEOUS
  Filled 2019-05-20 (×3): qty 0.4

## 2019-05-20 MED ORDER — ALBUTEROL SULFATE (2.5 MG/3ML) 0.083% IN NEBU: 2.5000 mg | INHALATION_SOLUTION | RESPIRATORY_TRACT | Status: DC | PRN

## 2019-05-20 MED ORDER — DONEPEZIL HCL 5 MG PO TABS
5.0000 mg | ORAL_TABLET | Freq: Every day | ORAL | Status: DC
  Administered 2019-05-20 – 2019-05-21 (×2): 5 mg via ORAL
  Filled 2019-05-20 (×3): qty 1

## 2019-05-20 NOTE — ED Triage Notes (Signed)
Pt arrives to ED from ? Group home via [facility] with c/c of SoB and COPD exacerbation. EMS reports Sx ongoing for 2 days. Transport vitals p107, 184/115, 88% on room air, 100% on CPAP. 18G placed in right forearm. Pt given 125 solumedrol and duoneb treatment. Upon arrival, pt A&Ox4. NAD. Reports feeling better on CPAP. Dr Ellender Hose at bedside.

## 2019-05-20 NOTE — ED Notes (Signed)
Per Dr. Blaine Hamper okay to wean patient off BiPap. RT made aware at this time.

## 2019-05-20 NOTE — ED Notes (Signed)
This RN to bedside, urinal emptied at this time, explained delay to patient at this time. Pt states understanding. Pt denies further needs. VSS.

## 2019-05-20 NOTE — ED Notes (Signed)
Medication administered per order. Repeat trop collected by this RN. Pt tolerated well. Pt denies any needs at this time. Resting comfortably in bed in BiPap at this time.

## 2019-05-20 NOTE — H&P (Signed)
History and Physical    Carlos Nelson [id number] DOB: 12-22-1950 DOA: 05/20/2019  Referring MD/NP/PA:   PCP: Jodi Marble, MD   Patient coming from:  The patient is coming from group home.  At baseline, pt is paritially dependent for most of ADL.        Chief Complaint: Shortness of breath  HPI: Carlos Nelson is a 69 y.o. male with medical history significant of hypertension, COPD, schizophrenia, who presents with shortness breath.  Patient has been having shortness breath in the past 2 days, which has been progressively worsening. Pt has cough, mostly dry cough.  He also reports pleuritic chest pain, which is located in the front chest, 6 out of 10 severity, aggravated by deep breath, nonradiating.  Patient was found to have oxygen saturation 87% on room air, which improved to 96% on BiPAP.  No nausea, vomiting, diarrhea, abdominal pain, symptoms of UTI or unilateral weakness.  ED Course: pt was found to have WBC 6.9, troponin VII, BNP 27, negative Covid PCR, electrolytes renal function okay, GFR>60, temperature normal, blood pressure 147/98, heart rate 95, RR 22, oxygen saturation 87% on RA and 96% on BiPAP.  Chest x-ray with patchy infiltration in the left basilar area.  Patient is admitted to progressive bed as inpatient.  Review of Systems:   General: no fevers, chills, no body weight gain, has fatigue HEENT: no blurry vision, hearing changes or sore throat Respiratory: has dyspnea, coughing, wheezing CV: has chest pain, no palpitations GI: no nausea, vomiting, abdominal pain, diarrhea, constipation GU: no dysuria, burning on urination, increased urinary frequency, hematuria  Ext: no leg edema Neuro: no unilateral weakness, numbness, or tingling, no vision change or hearing loss Skin: no rash, no skin tear. MSK: No muscle spasm, no deformity, no limitation of range of movement in spin Heme: No easy bruising.  Travel history: No recent long distant  travel. Psychiatry: No suicidal or homicidal ideations.  Allergy:  Allergies  Allergen Reactions  . Ace Inhibitors Swelling    Ok with benazapril, per grouphome    Past Medical History:  Diagnosis Date  . COPD (chronic obstructive pulmonary disease) ([place])   . Hypertension   . Schizophrenia [facility])     Past Surgical History:  Procedure Laterality Date  . COLONOSCOPY    . COLONOSCOPY WITH PROPOFOL N/A 12/13/2016   Procedure: COLONOSCOPY WITH PROPOFOL;  Surgeon: Lollie Sails, MD;  Location[facility] END[facility]COPY;  Service: Endoscopy;  Laterality: N/A;  . ESOPHAGOGASTRODUODENOSCOPY (EGD) WITH PROPOFOL N/A 12/13/2016   Procedure: ESOPHAGOGASTRODUODENOSCOPY (EGD) WITH PROPOFOL;  Surgeon: Lollie Sails, MD;  Location: [facility] ENDOSCOPY;  Service: Endoscopy;  Laterality: N/A;  . left arm surgery      Social History:  reports that he has quit smoking. His smoking use included cigarettes. He smoked 1.00 pack per day. He has never used smokeless tobacco. He reports current alcohol use. He reports current drug use. Drug: Marijuana.  Family History:  Family History  Problem Relation Age of Onset  . Prostate cancer Neg Hx   . Bladder Cancer Neg Hx      Prior to Admission medications   Medication Sig Start Date End Date Taking? Authorizing Provider  acetaminophen (TYLENOL) 650 MG CR tablet Take 650 mg by mouth every 6 (six) hours as needed for pain or fever.    [provider]  albuterol (PROVENTIL) (2.5 MG/3ML) 0.083% nebulizer solution Take 2.5 mg by nebulization every 4 (four) hours as needed for wheezing or shortness of  breath.    [provider]  albuterol (VENTOLIN HFA) 108 (90 Base) MCG/ACT inhaler Inhale 1 puff into the lungs every 6 (six) hours as needed for wheezing or shortness of breath.    [provider]  amLODipine (NORVASC) 2.5 MG tablet Take 1 tablet (2.5 mg total) by mouth daily. 08/22/18   Gouru, Illene Silver, MD  chlorthalidone (HYGROTON) 25 MG tablet  Take 12.5 mg by mouth daily.    [provider]  divalproex (DEPAKOTE ER) 250 MG 24 hr tablet Take 1 tablet (250 mg total) by mouth 2 (two) times daily. 08/17/18   Patrecia Pour, NP  docusate sodium (COLACE) 100 MG capsule Take 100 mg by mouth 2 (two) times daily.    [provider]  haloperidol (HALDOL) 0.5 MG tablet Take 5 tablets (2.5 mg total) by mouth daily. 08/23/18   Gouru, Illene Silver, MD  Ipratropium-Albuterol (COMBIVENT RESPIMAT) 20-100 MCG/ACT AERS respimat Inhale 1 puff into the lungs every 6 (six) hours.    [provider]  LORazepam (ATIVAN) 1 MG tablet Take 1 tablet (1 mg total) by mouth every 6 (six) hours as needed for anxiety. 08/22/18   Nicholes Mango, MD  metoprolol tartrate (LOPRESSOR) 50 MG tablet Take 1 tablet (50 mg total) by mouth 2 (two) times daily. 08/22/18   Nicholes Mango, MD  QUEtiapine (SEROQUEL XR) 400 MG 24 hr tablet Take 400 mg by mouth at bedtime.    [provider]  traZODone (DESYREL) 50 MG tablet Take 50 mg by mouth at bedtime.    [provider]  trihexyphenidyl (ARTANE) 5 MG tablet Take 5 mg by mouth 3 (three) times daily with meals.    [provider]  umeclidinium-vilanterol (ANORO ELLIPTA) 62.5-25 MCG/INH AEPB Inhale 1 puff into the lungs daily.    [provider]    Physical Exam: Vitals:   05/20/19 1230 05/20/19 1300 05/20/19 1330 05/20/19 1400  BP: (!) 153/91 (!) 145/96 (!) 145/95 (!) 160/89  Pulse: 91 91 90 90  Resp: 20 17 (!) 21 17  Temp:      TempSrc:      SpO2: 96% 100% 99% 100%  Weight:      Height:       General: Not in acute distress HEENT:       Eyes: PERRL, EOMI, no scleral icterus.       ENT: No discharge from the ears and nose, no pharynx injection, no tonsillar enlargement.        Neck: No JVD, no bruit, no mass felt. Heme: No neck lymph node enlargement. Cardiac: S1/S2, RRR, No murmurs, No gallops or rubs. Respiratory: Has wheezing bilaterally GI: Soft, nondistended, nontender,  no rebound pain, no organomegaly, BS present. GU: No hematuria Ext: No pitting leg edema bilaterally. 2+DP/PT pulse bilaterally. Musculoskeletal: No joint deformities, No joint redness or warmth, no limitation of ROM in spin. Skin: No rashes.  Neuro: Alert, oriented X3, cranial nerves II-XII grossly intact, moves all extremities normally.  Psych: Patient is calm currently, No suicidal or hemocidal ideation.  Labs on Admission: I have personally reviewed following labs and imaging studies  CBC: Recent Labs  Lab 05/20/19 0455  WBC 6.9  HGB 15.4  HCT 45.5  MCV 89.2  PLT [id number]   Basic Metabolic Panel: Recent Labs  Lab 05/20/19 0629  NA 139  K 3.7  CL 104  CO2 26  GLUCOSE 93  BUN 16  CREATININE 1.13  CALCIUM 9.0   GFR: Estimated Creatinine Clearance: 48.8  mL/min (by C-G formula based on SCr of 1.13 mg/dL). Liver Function Tests: Recent Labs  Lab 05/20/19 0629  AST 23  ALT 25  ALKPHOS 68  BILITOT 0.5  PROT 6.7  ALBUMIN 3.7   No results for input(s): LIPASE, AMYLASE in the last 168 hours. No results for input(s): AMMONIA in the last 168 hours. Coagulation Profile: No results for input(s): INR, PROTIME in the last 168 hours. Cardiac Enzymes: No results for input(s): CKTOTAL, CKMB, CKMBINDEX, TROPONINI in the last 168 hours. BNP (last 3 results) No results for input(s): PROBNP in the last 8760 hours. HbA1C: No results for input(s): HGBA1C in the last 72 hours. CBG: No results for input(s): GLUCAP in the last 168 hours. Lipid Profile: No results for input(s): CHOL, HDL, LDLCALC, TRIG, CHOLHDL, LDLDIRECT in the last 72 hours. Thyroid Function Tests: No results for input(s): TSH, T4TOTAL, FREET4, T3FREE, THYROIDAB in the last 72 hours. Anemia Panel: No results for input(s): VITAMINB12, FOLATE, FERRITIN, TIBC, IRON, RETICCTPCT in the last 72 hours. Urine analysis:    Component Value Date/Time   COLORURINE STRAW (A) 11/10/2018 1451   APPEARANCEUR CLEAR (A)  11/10/2018 1451   APPEARANCEUR Clear 11/09/2013 1525   LABSPEC 1.010 11/10/2018 1451   LABSPEC 1.014 11/09/2013 1525   PHURINE 7.0 11/10/2018 1451   GLUCOSEU NEGATIVE 11/10/2018 1451   GLUCOSEU Negative 11/09/2013 1525   HGBUR NEGATIVE 11/10/2018 1451   BILIRUBINUR NEGATIVE 11/10/2018 1451   BILIRUBINUR Negative 11/09/2013 [place] 11/10/2018 1451   PROTEINUR NEGATIVE 11/10/2018 1451   NITRITE NEGATIVE 11/10/2018 1451   LEUKOCYTESUR NEGATIVE 11/10/2018 1451   LEUKOCYTESUR Negative 11/09/2013 1525   Sepsis Labs: @LABRCNTIP (procalcitonin:4,lacticidven:4) ) Recent Results (from the past 240 hour(s))  Respiratory Panel by RT PCR (Flu A&B, Covid) - Nasopharyngeal Swab     Status: None   Collection Time: 05/20/19  4:55 AM   Specimen: Nasopharyngeal Swab  Result Value Ref Range Status   SARS Coronavirus 2 by RT PCR NEGATIVE NEGATIVE Final    Comment: (NOTE) SARS-CoV-2 target nucleic acids are NOT DETECTED. The SARS-CoV-2 RNA is generally detectable in upper respiratoy specimens during the acute phase of infection. The lowest concentration of SARS-CoV-2 viral copies this assay can detect is 131 copies/mL. A negative result does not preclude SARS-Cov-2 infection and should not be used as the sole basis for treatment or other patient management decisions. A negative result may occur with  improper specimen collection/handling, submission of specimen other than nasopharyngeal swab, presence of viral mutation(s) within the areas targeted by this assay, and inadequate number of viral copies (<131 copies/mL). A negative result must be combined with clinical observations, patient history, and epidemiological information. The expected result is Negative. Fact Sheet for Patients:  PinkCheek.be Fact Sheet for Healthcare Providers:  GravelBags.it This test is not yet ap proved or cleared by the [place] FDA and   has been authorized for detection and/or diagnosis of SARS-CoV-2 by FDA under an Emergency Use Authorization (EUA). This EUA will remain  in effect (meaning this test can be used) for the duration of the COVID-19 declaration under Section 564(b)(1) of the Act, 21 U.S.C. section 360bbb-3(b)(1), unless the authorization is terminated or revoked sooner.    Influenza A by PCR NEGATIVE NEGATIVE Final   Influenza B by PCR NEGATIVE NEGATIVE Final    Comment: (NOTE) The Xpert Xpress SARS-CoV-2/FLU/RSV assay is intended as an aid in  the diagnosis of influenza from Nasopharyngeal swab specimens and  should not be used as  a sole basis for treatment. Nasal washings and  aspirates are unacceptable for Xpert Xpress SARS-CoV-2/FLU/RSV  testing. Fact Sheet for Patients: PinkCheek.be Fact Sheet for Healthcare Providers: GravelBags.it This test is not yet approved or cleared by the [place] FDA and  has been authorized for detection and/or diagnosis of SARS-CoV-2 by  FDA under an Emergency Use Authorization (EUA). This EUA will remain  in effect (meaning this test can be used) for the duration of the  Covid-19 declaration under Section 564(b)(1) of the Act, 21  U.S.C. section 360bbb-3(b)(1), unless the authorization is  terminated or revoked. Performed at [facility], [address] [place], [place] [address]      Radiological Exams on Admission: DG Chest Portable 1 View  Result Date: 05/20/2019 CLINICAL DATA:  Shortness of breath, concern for COPD exacerbation EXAM: PORTABLE CHEST 1 VIEW COMPARISON:  Radiograph 11/10/2018, CT 01/31/2012 FINDINGS: Relative apical lucency with some coarse interstitial changes compatible with features of emphysema better seen on comparison CT. There is some more patchy opacity in the left lung base as well as diffuse mild airways thickening. Chronic left perihilar scarring is noted.  Cardiomediastinal contours are stable. No pneumothorax or visible effusion. No acute osseous or soft tissue abnormality. Degenerative changes are present in the imaged spine and shoulders. Telemetry leads overlie the chest. IMPRESSION: 1. Patchy opacity in the left lung base as well as diffuse mild airways thickening, could reflect acute infection or inflammation on more chronic bronchitic changes. 2. Emphysematous changes, better seen on comparison CT. Electronically Signed   By: Lovena Le M.D.   On: 05/20/2019 05:51     EKG: Independently reviewed.  Sinus rhythm, QTC 464, bilateral atrial enlargement, low voltage Assessment/Plan Principal Problem:   COPD exacerbation (HCC) Active Problems:   Schizophrenia (HCC)   Hypertension   Acute on chronic respiratory failure with hypoxia (HCC)   Pleuritic chest pain   CAP (community acquired pneumonia)  Acute on chronic respiratory failure with hypoxia due to COPD exacerbation and possible CAP: Chest x-ray showed patchy infiltration in the left basilar area, indicating possible pneumonia.   - Will admit to progressive unit as inpatient - continue BiPAP -Solu-Medrol 40 mg IV tid -Rocephin and azithromycin started -Mucinex for cough  -Incentive spirometry -Urine S. pneumococcal and Legionella antigen -Follow up blood culture x2, sputum culture -Nasal cannula oxygen as needed to maintain O2 saturation 93% or greater when pt is off BiPAP  HTN:  -Lotrel -hydralazine prn  Pleuritic chest pain: Likely due to possible CAP. Trop 7 -->6. -Prn tylenol  Schizophrenia ([place]): pt is calm and cooperative on admission -Continue home medications: Haldol, hydroxyzine, Ativan, Seroquel, artane, Depakote   DVT ppx: SQ Lovenox Code Status: Full code Family Communication: not done, no family member is at bed side. I called her sister using the number in Epic, but nobody picked up the phone. Disposition Plan:  Anticipate discharge back to previous group  home environment Consults called:  none Admission status:  progressive unit as inpt    Status is: Inpatient Remains inpatient appropriate because:Inpatient level of care appropriate due to severity of illness Dispo: The patient is from: Group home              Anticipated d/c is to: Group home              Anticipated d/c date is: 2 days              Patient currently is not medically stable to  d/c.    Inpatient status:  # Patient requires inpatient status due to high intensity of service, high risk for further deterioration and high frequency of surveillance required.  I certify that at the point of admission it is my clinical judgment that the patient will require inpatient hospital care spanning beyond 2 midnights from the point of admission.  . This patient has multiple chronic comorbidities including hypertension, COPD, schizophrenia, who presents with shortness breath. . Now patient has presenting with acute on chronic respiratory failure with hypoxia due to COPD exacerbation and possible CAP.  Marland Kitchen The worrisome physical exam findings include wheezing on auscultation . The initial radiographic and laboratory data are worrisome because of patchy infiltration on left lower base by CXR . Current medical needs: please see my assessment and plan . Predictability of an adverse outcome (risk): Patient has multiple comorbidities as listed above. Now presents with  acute on chronic respiratory failure with hypoxia due to COPD exacerbation and possible CAP, requiring BiPAP. Patient's presentation is highly complicated.  Patient is at high risk of deteriorating.  Will need to be treated in hospital for at least 2 days.            Date of Service 05/20/2019    [facility]   If 7PM-7AM, please contact night-coverage [email or web address] 05/20/2019, 2:56 PM

## 2019-05-20 NOTE — ED Notes (Signed)
Pt given meal tray at this time. Meds administered per order. Pt tolerated well.

## 2019-05-20 NOTE — ED Notes (Signed)
Pt visualized in NAD at this time, resting in bed. Denies any needs. Call bell remains within reach.

## 2019-05-20 NOTE — ED Notes (Signed)
Pt given gingerale at this time. Pt remains alert and visualized in NAD at this time.

## 2019-05-20 NOTE — ED Notes (Signed)
No change in patient condition. Pt continues to rest in bed with lights dimmed, continues to rest on Bipap, tolerating well. Call bell remains within reach. VSS at this time.

## 2019-05-20 NOTE — ED Notes (Signed)
This RN spoke with admitting MD, VORB for ABG at 1330 prior trial off of BiPap. RT made aware of pending ABG at this time.

## 2019-05-20 NOTE — ED Notes (Signed)
This RN to bedside, introduced self to patient. Blood work collected by this Therapist, sports. Abx initiated by this RN per MD order. VORB to adjusted timed trop to 0830 due to recollect and first troponin being collected at 0630. Pt visualized in NAD at this time. Call bell within reach of patient at this time. Pt denies any needs at this time.

## 2019-05-20 NOTE — ED Notes (Signed)
Admitting MD made aware that patient's repeat ABG resulted. Awaiting orders to wean or have patient remain on BiPap.

## 2019-05-20 NOTE — ED Notes (Signed)
This RN to bedside, pt updated regarding plan of care, breathing tx initiated through BiPap as ordered. Pt appears in NAD at this time, resting in bed with NAD noted. Pt denies any needs. Call remains within reach at this time.

## 2019-05-20 NOTE — ED Notes (Signed)
Pt repositioned in bed by this RN and [employer], Therapist, sports. Pt tolerated well. Pt denies any needs. VSS. Pt states understanding to use call bell should further needs arise.

## 2019-05-20 NOTE — ED Notes (Signed)
This RN to bedside, pt provided with 2 more warm blankets, apologized and explained to patient would repeat ABG at 1630 per the doctor's orders. RT made aware of repeat ABG. Pt states understanding. Call bell remains within reach at this time.

## 2019-05-20 NOTE — ED Notes (Signed)
RT at bedside to transition patient to [facility].

## 2019-05-20 NOTE — ED Notes (Signed)
This RN to bedside, explained expecting RT at any time to redraw blood work then would confer with admitting MD about weaning off BiPap. Pt states understanding. Visualized resting in bed watching TV. Call bell remains within reach. Gave permission for this RN to contact his sister.

## 2019-05-20 NOTE — ED Notes (Signed)
Admitting MD made aware of patient's ABG resulting, per admitting MD, continue Bipap at this time.

## 2019-05-20 NOTE — ED Notes (Signed)
This RN to bedside at this time, apologized once again and explained delay in taking BiPap off of patient. Pt states understanding. Denies any needs, states he is comfortable at this time.

## 2019-05-20 NOTE — ED Notes (Signed)
This RN spoke with Dr. Blaine Hamper regarding possible repeat ABG, VORB to repeat ABG at 1630 to see if patient potential to be weaned off Bipap.

## 2019-05-20 NOTE — ED Notes (Signed)
RT at bedside to redraw ABG.  

## 2019-05-20 NOTE — ED Provider Notes (Signed)
[facility] Emergency Department Provider Note  ____________________________________________   First MD Initiated Contact with Patient 05/20/19 [phone]     (approximate)  I have reviewed the triage vital signs and the nursing notes.   HISTORY  Chief Complaint Shortness of Breath    HPI Carlos Nelson is a 69 y.o. male with past medical history as below including schizophrenia, hypertension, COPD, here with shortness of breath.  History is somewhat limited due to increased work of breathing.  He reports that over the last several days, he has been increasingly more short of breath.  He feels like he has been coughing and wheezing.  Is been producing yellow-green sputum.  He states he does not like to take medications and has not been using any of his medications including his inhalers.  Eventually, symptoms got severe enough to call EMS.  On EMS arrival, patient was mildly hypoxic with increased work of breathing.  He was given duo nebs and placed on CPAP with improvement.  Was also given Solu-Medrol.  He states this has improved his breathing and he feels somewhat better, though he continues to feel shortness of breath.  No chest pain.        Past Medical History:  Diagnosis Date  . COPD (chronic obstructive pulmonary disease) ([place])   . Hypertension   . Schizophrenia [facility])     Patient Active Problem List   Diagnosis Date Noted  . Pleuritic chest pain 05/20/2019  . CAP (community acquired pneumonia) 05/20/2019  . Hypertension   . COPD exacerbation ([place])   . Acute on chronic respiratory failure with hypoxia ([place])   . Altered mental status 09/25/2018  . Schizophrenia ([place])   . Tachycardia 08/21/2018  . Schizophrenia, chronic with acute exacerbation ([place]) 08/17/2018  . Elevated PSA 05/24/2015    Past Surgical History:  Procedure Laterality Date  . COLONOSCOPY    . COLONOSCOPY WITH PROPOFOL N/A 12/13/2016   Procedure: COLONOSCOPY WITH PROPOFOL;   Surgeon: Lollie Sails, MD;  Location: [facility] ENDOSCOPY;  Service: Endoscopy;  Laterality: N/A;  . ESOPHAGOGASTRODUODENOSCOPY (EGD) WITH PROPOFOL N/A 12/13/2016   Procedure: ESOPHAGOGASTRODUODENOSCOPY (EGD) WITH PROPOFOL;  Surgeon: Lollie Sails, MD;  Location: [facility] ENDOSCOPY;  Service: Endoscopy;  Laterality: N/A;  . left arm surgery      Prior to Admission medications   Medication Sig Start Date End Date Taking? Authorizing Provider  acetaminophen (TYLENOL) 650 MG CR tablet Take 650 mg by mouth every 6 (six) hours as needed for pain or fever.    [provider]  albuterol (PROVENTIL) (2.5 MG/3ML) 0.083% nebulizer solution Take 2.5 mg by nebulization every 4 (four) hours as needed for wheezing or shortness of breath.    [provider]  albuterol (VENTOLIN HFA) 108 (90 Base) MCG/ACT inhaler Inhale 1 puff into the lungs every 6 (six) hours as needed for wheezing or shortness of breath.    [provider]  amLODipine (NORVASC) 2.5 MG tablet Take 1 tablet (2.5 mg total) by mouth daily. 08/22/18   Gouru, Illene Silver, MD  chlorthalidone (HYGROTON) 25 MG tablet Take 12.5 mg by mouth daily.    [provider]  divalproex (DEPAKOTE ER) 250 MG 24 hr tablet Take 1 tablet (250 mg total) by mouth 2 (two) times daily. 08/17/18   Patrecia Pour, NP  docusate sodium (COLACE) 100 MG capsule Take 100 mg by mouth 2 (two) times daily.    [provider]  haloperidol (HALDOL) 0.5 MG tablet Take 5  tablets (2.5 mg total) by mouth daily. 08/23/18   Gouru, Illene Silver, MD  Ipratropium-Albuterol (COMBIVENT RESPIMAT) 20-100 MCG/ACT AERS respimat Inhale 1 puff into the lungs every 6 (six) hours.    [provider]  LORazepam (ATIVAN) 1 MG tablet Take 1 tablet (1 mg total) by mouth every 6 (six) hours as needed for anxiety. 08/22/18   Nicholes Mango, MD  metoprolol tartrate (LOPRESSOR) 50 MG tablet Take 1 tablet (50 mg total) by mouth 2 (two) times daily. 08/22/18   Nicholes Mango, MD    QUEtiapine (SEROQUEL XR) 400 MG 24 hr tablet Take 400 mg by mouth at bedtime.    [provider]  traZODone (DESYREL) 50 MG tablet Take 50 mg by mouth at bedtime.    [provider]  trihexyphenidyl (ARTANE) 5 MG tablet Take 5 mg by mouth 3 (three) times daily with meals.    [provider]  umeclidinium-vilanterol (ANORO ELLIPTA) 62.5-25 MCG/INH AEPB Inhale 1 puff into the lungs daily.    [provider]    Allergies Ace inhibitors  Family History  Problem Relation Age of Onset  . Prostate cancer Neg Hx   . Bladder Cancer Neg Hx     Social History Social History   Tobacco Use  . Smoking status: Former Smoker    Packs/day: 1.00    Types: Cigarettes  . Smokeless tobacco: Never Used  Substance Use Topics  . Alcohol use: Yes    Alcohol/week: 0.0 standard drinks    Comment: beer  . Drug use: Yes    Types: Marijuana    Comment: marjuana    Review of Systems  Review of Systems  Constitutional: Positive for fatigue. Negative for chills and fever.  HENT: Negative for sore throat.   Respiratory: Positive for cough, shortness of breath and wheezing.   Cardiovascular: Positive for chest pain.  Gastrointestinal: Negative for abdominal pain.  Genitourinary: Negative for flank pain.  Musculoskeletal: Negative for neck pain.  Skin: Negative for rash and wound.  Allergic/Immunologic: Negative for immunocompromised state.  Neurological: Positive for weakness. Negative for numbness.  Hematological: Does not bruise/bleed easily.  All other systems reviewed and are negative.    ____________________________________________  PHYSICAL EXAM:      VITAL SIGNS: ED Triage Vitals  Enc Vitals Group     BP 05/20/19 0453 (!) 162/115     Pulse Rate 05/20/19 0453 88     Resp 05/20/19 0453 16     Temp 05/20/19 0453 98.8 F (37.1 C)     Temp Source 05/20/19 0453 Oral     SpO2 05/20/19 0453 100 %     Weight 05/20/19 0456 125 lb (56.7 kg)     Height  05/20/19 0456 5\' 4"  (1.626 m)     Head Circumference --      Peak Flow --      Pain Score 05/20/19 0456 0     Pain Loc --      Pain Edu? --      Excl. in [place]? --      Physical Exam Vitals and nursing note reviewed.  Constitutional:      General: He is not in acute distress.    Appearance: He is well-developed.  HENT:     Head: Normocephalic and atraumatic.  Eyes:     Conjunctiva/sclera: Conjunctivae normal.  Cardiovascular:     Rate and Rhythm: Normal rate and regular rhythm.     Heart sounds: Normal heart sounds. No murmur. No friction rub.  Pulmonary:     Effort: Pulmonary effort is normal. No respiratory distress.     Breath sounds: Examination of the right-upper field reveals wheezing. Examination of the left-upper field reveals wheezing. Examination of the right-middle field reveals wheezing. Examination of the left-middle field reveals wheezing. Examination of the right-lower field reveals wheezing. Examination of the left-lower field reveals wheezing. Decreased breath sounds and wheezing present. No rales.  Abdominal:     General: There is no distension.     Palpations: Abdomen is soft.     Tenderness: There is no abdominal tenderness.  Musculoskeletal:     Cervical back: Neck supple.  Skin:    General: Skin is warm.     Capillary Refill: Capillary refill takes less than 2 seconds.  Neurological:     Mental Status: He is alert and oriented to person, place, and time.     Motor: No abnormal muscle tone.       ____________________________________________   LABS (all labs ordered are listed, but only abnormal results are displayed)  Labs Reviewed  BLOOD GAS, VENOUS - Abnormal; Notable for the following components:      Result Value   pCO2, Ven 64 (*)    pO2, Ven <31.0 (*)    Bicarbonate 32.2 (*)    Acid-Base Excess 4.0 (*)    All other components within normal limits  RESPIRATORY PANEL BY RT PCR (FLU A&B, COVID)  CULTURE, BLOOD (ROUTINE X 2)  CULTURE, BLOOD  (ROUTINE X 2)  EXPECTORATED SPUTUM ASSESSMENT W REFEX TO RESP CULTURE  CBC  HEPATIC FUNCTION PANEL  BRAIN NATRIURETIC PEPTIDE  BASIC METABOLIC PANEL  LACTIC ACID, PLASMA  HIV ANTIBODY (ROUTINE TESTING W REFLEX)  LEGIONELLA PNEUMOPHILA SEROGP 1 UR AG  STREP PNEUMONIAE URINARY ANTIGEN  TROPONIN I (HIGH SENSITIVITY)  TROPONIN I (HIGH SENSITIVITY)    ____________________________________________  EKG: Normal sinus rhythm, ventricular rate 94.  QRS 108, QTc 464.  Nonspecific ST changes, likely rate related.  No ST elevations. ________________________________________  RADIOLOGY All imaging, including plain films, CT scans, and ultrasounds, independently reviewed by me, and interpretations confirmed via formal radiology reads.  ED MD interpretation:   CXR: Chronic bronchitis with new patchy infiltrates c/f PNA  Official radiology report(s): DG Chest Portable 1 View  Result Date: 05/20/2019 CLINICAL DATA:  Shortness of breath, concern for COPD exacerbation EXAM: PORTABLE CHEST 1 VIEW COMPARISON:  Radiograph 11/10/2018, CT 01/31/2012 FINDINGS: Relative apical lucency with some coarse interstitial changes compatible with features of emphysema better seen on comparison CT. There is some more patchy opacity in the left lung base as well as diffuse mild airways thickening. Chronic left perihilar scarring is noted. Cardiomediastinal contours are stable. No pneumothorax or visible effusion. No acute osseous or soft tissue abnormality. Degenerative changes are present in the imaged spine and shoulders. Telemetry leads overlie the chest. IMPRESSION: 1. Patchy opacity in the left lung base as well as diffuse mild airways thickening, could reflect acute infection or inflammation on more chronic bronchitic changes. 2. Emphysematous changes, better seen on comparison CT. Electronically Signed   By: Lovena Le M.D.   On: 05/20/2019 05:51     ____________________________________________  PROCEDURES   Procedure(s) performed (including Critical Care):  .Critical Care Performed by: Duffy Bruce, MD Authorized by: Duffy Bruce, MD   Critical care provider statement:    Critical care time (minutes):  35   Critical care time was exclusive of:  Separately billable procedures and treating other patients and teaching time   Critical care  was necessary to treat or prevent imminent or life-threatening deterioration of the following conditions:  Circulatory failure, respiratory failure and cardiac failure   Critical care was time spent personally by me on the following activities:  Development of treatment plan with patient or surrogate, discussions with consultants, evaluation of patient's response to treatment, examination of patient, obtaining history from patient or surrogate, ordering and performing treatments and interventions, ordering and review of laboratory studies, ordering and review of radiographic studies, pulse oximetry, re-evaluation of patient's condition and review of old charts   I assumed direction of critical care for this patient from another provider in my specialty: no   .1-3 Lead EKG Interpretation Performed by: Duffy Bruce, MD Authorized by: Duffy Bruce, MD     Interpretation: normal     ECG rate:  90s   ECG rate assessment: normal     Rhythm: sinus rhythm     Ectopy: PVCs     Conduction: normal   Comments:     Indication: SOB, resp failure    ____________________________________________  INITIAL IMPRESSION / MDM / ASSESSMENT AND PLAN / ED COURSE  As part of my medical decision making, I reviewed the following data within the [facility] notes reviewed and incorporated, Old chart reviewed, Notes from prior ED visits, and [place] Controlled Substance Database       *PASON BRADNEY was evaluated in Emergency Department on 05/20/2019 for the symptoms described in  the history of present illness. He was evaluated in the context of the global COVID-19 pandemic, which necessitated consideration that the patient might be at risk for infection with the SARS-CoV-2 virus that causes COVID-19. Institutional protocols and algorithms that pertain to the evaluation of patients at risk for COVID-19 are in a state of rapid change based on information released by regulatory bodies including the CDC and federal and state organizations. These policies and algorithms were followed during the patient's care in the ED.  Some ED evaluations and interventions may be delayed as a result of limited staffing during the pandemic.*     Medical Decision Making:  69 yo M with h/o schizophrenia, COPD here with acute on chronic SOB. Labs, records reviewed. H/o schizophrenia with nonadherence to med regimen. Pt placed on BIPAP for work of breathing and given nebs, steroids by EMS. CXR is c/w COPD with likely superimposed PNA. Will cover empirically, admit to medicine. COVID is pending. EKG non-ischemic and he denies CP - doubt ACS, PE. Tele is without significant arrhythmia.   ____________________________________________  FINAL CLINICAL IMPRESSION(S) / ED DIAGNOSES  Final diagnoses:  COPD exacerbation ([place])  Community acquired pneumonia, unspecified laterality     MEDICATIONS GIVEN DURING THIS VISIT:  Medications  ipratropium-albuterol (DUONEB) 0.5-2.5 (3) MG/3ML nebulizer solution 3 mL (3 mLs Nebulization Given 05/20/19 0833)  albuterol (PROVENTIL) (2.5 MG/3ML) 0.083% nebulizer solution 2.5 mg (has no administration in time range)  methylPREDNISolone sodium succinate (SOLU-MEDROL) 40 mg/mL injection 40 mg (has no administration in time range)  dextromethorphan-guaiFENesin (MUCINEX DM) 30-600 MG per 12 hr tablet 1 tablet (has no administration in time range)  hydrALAZINE (APRESOLINE) injection 5 mg (has no administration in time range)  ondansetron (ZOFRAN) injection 4 mg (has no  administration in time range)  acetaminophen (TYLENOL) tablet 650 mg (has no administration in time range)  cefTRIAXone (ROCEPHIN) 2 g in sodium chloride 0.9 % 100 mL IVPB (has no administration in time range)  azithromycin (ZITHROMAX) 500 mg in sodium chloride 0.9 % 250 mL IVPB (  has no administration in time range)  LORazepam (ATIVAN) tablet 1 mg (has no administration in time range)  divalproex (DEPAKOTE ER) 24 hr tablet 250 mg (has no administration in time range)  haloperidol (HALDOL) tablet 2.5 mg (has no administration in time range)  enoxaparin (LOVENOX) injection 40 mg (has no administration in time range)  ipratropium-albuterol (DUONEB) 0.5-2.5 (3) MG/3ML nebulizer solution 3 mL (3 mLs Nebulization Given 05/20/19 0514)  ipratropium-albuterol (DUONEB) 0.5-2.5 (3) MG/3ML nebulizer solution 3 mL (3 mLs Nebulization Given 05/20/19 0514)  ipratropium-albuterol (DUONEB) 0.5-2.5 (3) MG/3ML nebulizer solution 3 mL (3 mLs Nebulization Given 05/20/19 0514)  cefTRIAXone (ROCEPHIN) 2 g in sodium chloride 0.9 % 100 mL IVPB (0 g Intravenous Stopped 05/20/19 0801)  azithromycin (ZITHROMAX) 500 mg in sodium chloride 0.9 % 250 mL IVPB (500 mg Intravenous New Bag/Given 05/20/19 0815)     ED Discharge Orders    None       Note:  This document was prepared using Dragon voice recognition software and may include unintentional dictation errors.   Duffy Bruce, MD 05/20/19 [phone]

## 2019-05-20 NOTE — ED Notes (Signed)
RT at bedside to draw ABG

## 2019-05-21 LAB — CBC
HCT: 42 % (ref 39.0–52.0)
Hemoglobin: 14.4 g/dL (ref 13.0–17.0)
MCH: 29.9 pg (ref 26.0–34.0)
MCHC: 34.3 g/dL (ref 30.0–36.0)
MCV: 87.1 fL (ref 80.0–100.0)
Platelets: 185 10*3/uL (ref 150–400)
RBC: 4.82 MIL/uL (ref 4.22–5.81)
RDW: 13.3 % (ref 11.5–15.5)
WBC: 9.9 10*3/uL (ref 4.0–10.5)
nRBC: 0 % (ref 0.0–0.2)

## 2019-05-21 LAB — EXPECTORATED SPUTUM ASSESSMENT W GRAM STAIN, RFLX TO RESP C

## 2019-05-21 LAB — BASIC METABOLIC PANEL
Anion gap: 8 (ref 5–15)
BUN: 17 mg/dL (ref 8–23)
CO2: 28 mmol/L (ref 22–32)
Calcium: 9.1 mg/dL (ref 8.9–10.3)
Chloride: 102 mmol/L (ref 98–111)
Creatinine, Ser: 0.95 mg/dL (ref 0.61–1.24)
GFR calc Af Amer: >60 mL/min (ref >60)
GFR calc non Af Amer: >60 mL/min (ref >60)
Glucose, Bld: 118 mg/dL — ABNORMAL HIGH (ref 70–99)
Potassium: 4.2 mmol/L (ref 3.5–5.1)
Sodium: 138 mmol/L (ref 135–145)

## 2019-05-21 LAB — LEGIONELLA PNEUMOPHILA SEROGP 1 UR AG: L. pneumophila Serogp 1 Ur Ag: NEGATIVE

## 2019-05-21 MED ORDER — AZITHROMYCIN 250 MG PO TABS
500.0000 mg | ORAL_TABLET | Freq: Every day | ORAL | Status: DC
  Administered 2019-05-22: 09:00:00 500 mg via ORAL
  Filled 2019-05-21: qty 2

## 2019-05-21 MED ORDER — PREDNISONE 20 MG PO TABS
40.0000 mg | ORAL_TABLET | Freq: Every day | ORAL | Status: DC
  Administered 2019-05-22: 40 mg via ORAL
  Filled 2019-05-21: qty 2

## 2019-05-21 MED ORDER — IPRATROPIUM-ALBUTEROL 0.5-2.5 (3) MG/3ML IN SOLN
3.0000 mL | Freq: Three times a day (TID) | RESPIRATORY_TRACT | Status: DC
  Administered 2019-05-21 – 2019-05-22 (×3): 3 mL via RESPIRATORY_TRACT
  Filled 2019-05-21 (×2): qty 3

## 2019-05-21 NOTE — TOC Initial Note (Signed)
Transition of Care [facility]) - Initial/Assessment Note    Patient Details  Name: Carlos Nelson MRN: [id number] Date of Birth: [birth year]  [facility] CM/SW Contact:    Victorino Dike, RN Phone Number: 05/21/2019, 2:23 PM  Clinical Narrative:                  Patient lives at [place].  Spoke with Marcelline Mates, Administrator, she asks that [facility] be faxed to [phone] at discharge.  She reports he has not had [facility] because he has not needed them. Her number is [phone].  Expected Discharge Plan: Group Home Barriers to Discharge: Continued Medical Work up   Patient Goals and CMS Choice        Expected Discharge Plan and Services Expected Discharge Plan: Group Home   Discharge Planning Services: CM Consult   Living arrangements for the past 2 months: Group Home                                      Prior Living Arrangements/Services Living arrangements for the past 2 months: Group Home Lives with:: Facility Resident Patient language and need for interpreter reviewed:: Yes        Need for Family Participation in Patient Care: Yes (Comment) Care giver support system in place?: Yes (comment)   Criminal Activity/Legal Involvement Pertinent to Current Situation/Hospitalization: No - Comment as needed  Activities of Daily Living Home Assistive Devices/Equipment: None ADL Screening (condition at time of admission) Patient's cognitive ability adequate to safely complete daily activities?: Yes Is the patient deaf or have difficulty hearing?: No Does the patient have difficulty seeing, even when wearing glasses/contacts?: No Does the patient have difficulty concentrating, remembering, or making decisions?: No Patient able to express need for assistance with ADLs?: Yes Does the patient have difficulty dressing or bathing?: No Independently performs ADLs?: Yes (appropriate for developmental age) Does the patient have  difficulty walking or climbing stairs?: No Weakness of Legs: Both Weakness of Arms/Hands: None  Permission Sought/Granted                  Emotional Assessment       Orientation: : Oriented to  Time, Oriented to Place, Oriented to Self, Oriented to Situation Alcohol / Substance Use: Not Applicable Psych Involvement: No (comment)  Admission diagnosis:  COPD exacerbation ([place]) [J44.1] Community acquired pneumonia, unspecified laterality [J18.9] Patient Active Problem List   Diagnosis Date Noted  . Pleuritic chest pain 05/20/2019  . CAP (community acquired pneumonia) 05/20/2019  . Hypertension   . COPD exacerbation ([place])   . Acute on chronic respiratory failure with hypoxia ([place])   . Altered mental status 09/25/2018  . Schizophrenia ([place])   . Tachycardia 08/21/2018  . Schizophrenia, chronic with acute exacerbation ([place]) 08/17/2018  . Elevated PSA 05/24/2015   PCP:  Jodi Marble, MD Pharmacy:   [facility], [place] [address] [place] [address] Phone: [phone] Fax: [phone]     Social Determinants of Health (SDOH) Interventions    Readmission Risk Interventions No flowsheet data found.

## 2019-05-21 NOTE — Progress Notes (Signed)
PHARMACIST - PHYSICIAN COMMUNICATION DR:   Reesa Chew CONCERNING: Antibiotic IV to Oral Route Change Policy  RECOMMENDATION: This patient is receiving Azithromycin by the intravenous route.  Based on criteria approved by the Pharmacy and Therapeutics Committee, the antibiotic(s) is/are being converted to the equivalent oral dose form(s).   DESCRIPTION: These criteria include:  Patient being treated for a respiratory tract infection, urinary tract infection, cellulitis or clostridium difficile associated diarrhea if on metronidazole  The patient is not neutropenic and does not exhibit a GI malabsorption state  The patient is eating (either orally or via tube) and/or has been taking other orally administered medications for a least 24 hours  The patient is improving clinically and has a Tmax < 100.5  If you have questions about this conversion, please contact the [place], PharmD, BCPS Clinical Pharmacist 05/21/2019 11:50 AM

## 2019-05-21 NOTE — Plan of Care (Signed)
  Problem: Education: Goal: Knowledge of General Education information will improve Description: Including pain rating scale, medication(s)/side effects and non-pharmacologic comfort measures Outcome: Progressing   Problem: Health Behavior/Discharge Planning: Goal: Ability to manage health-related needs will improve Outcome: Progressing   Problem: Clinical Measurements: Goal: Ability to maintain clinical measurements within normal limits will improve Outcome: Progressing Goal: Will remain free from infection Outcome: Progressing Note: Patient on PO Zithromax and IV Rocephin. WBC's are WDL. Will continue to monitor overall condition for the remainder of the shift. Wenda Low [facility]

## 2019-05-21 NOTE — Progress Notes (Signed)
PROGRESS NOTE    Carlos Nelson  [id number] DOB: Mar 24, 1950 DOA: 05/20/2019 PCP: Jodi Marble, MD   Brief Narrative:  Carlos Nelson is a 69 y.o. male with medical history significant of hypertension, COPD, schizophrenia, who presents with shortness breath.  Patient has been having shortness breath in the past 2 days, which has been progressively worsening. Pt has cough, mostly dry cough.  He also reports pleuritic chest pain, which is located in the front chest, 6 out of 10 severity, aggravated by deep breath, nonradiating.  Patient was found to have oxygen saturation 87% on room air, which improved to 96% on BiPAP.  Chest x-ray with patchy infiltrate in left basilar area and patient admitted for COPD exacerbation with Possible CAP.  Subjective: Patient has no new complaints.  Continues to have some cough, shortness of breath improving.  Assessment & Plan:   Principal Problem:   COPD exacerbation ([place]) Active Problems:   Schizophrenia ([place])   Hypertension   Acute on chronic respiratory failure with hypoxia ([facility])   Pleuritic chest pain   CAP (community acquired pneumonia)  Acute on chronic respiratory failure with hypoxia due to COPD exacerbation and possible CAP: Chest x-ray showed patchy infiltration in the left basilar area, indicating possible pneumonia.  Strepneumo antigen negative. Blood Culture remain negative. Wheezing improved today. -Discontinue Solu-Medrol and start him on prednisone 40 mg daily for 5 days. -Continue Rocephin and azithromycin. -Supplemental oxygen as needed. -Continue DuoNeb.  Hypertension.  -Continue home dose of Lotrel.  Schizophrenia ([place]): pt is calm and cooperative. -Continue home medications: Haldol, hydroxyzine, Ativan, Seroquel, artane, Depakote   Objective: Vitals:   05/21/19 0727 05/21/19 0739 05/21/19 1149 05/21/19 1242  BP: (!) 146/75  127/73   Pulse: 98  (!) 104   Resp: 18  (!) 24 19  Temp: 98.2 F (36.8 C)  98.1  F (36.7 C)   TempSrc: Oral  Oral   SpO2: 92% 91% 95%   Weight:      Height:        Intake/Output Summary (Last 24 hours) at 05/21/2019 1326 Last data filed at 05/21/2019 1204 Gross per 24 hour  Intake 480 ml  Output 1775 ml  Net -1295 ml   Filed Weights   05/20/19 0456 05/20/19 2034 05/21/19 0423  Weight: 56.7 kg 56.1 kg 55 kg    Examination:  General exam: Appears calm and comfortable  Respiratory system: Clear to auscultation. Respiratory effort normal. Cardiovascular system: S1 & S2 heard, RRR. No JVD, murmurs, rubs, gallops or clicks. Gastrointestinal system: Soft, nontender, nondistended, bowel sounds positive. Central nervous system: Alert and oriented. No focal neurological deficits.Symmetric 5 x 5 power. Extremities: No edema, no cyanosis, pulses intact and symmetrical. Psychiatry: Judgement and insight appear impaired.  DVT prophylaxis: Lovenox Code Status: Full Family Communication: No family at bedside. Disposition Plan:  Status is: Inpatient  Remains inpatient appropriate because:Inpatient level of care appropriate due to severity of illness   Dispo: The patient is from: Group home              Anticipated d/c is to: Group home              Anticipated d/c date is: 1 day              Patient currently is not medically stable to d/c.   Consultants:   None  Procedures:  Antimicrobials:  Rocephin Azithromycin  Data Reviewed: I have personally reviewed following labs and imaging studies  CBC:  Recent Labs  Lab 05/20/19 0455 05/21/19 0441  WBC 6.9 9.9  HGB 15.4 14.4  HCT 45.5 42.0  MCV 89.2 87.1  PLT 207 [id number]   Basic Metabolic Panel: Recent Labs  Lab 05/20/19 0629 05/21/19 0441  NA 139 138  K 3.7 4.2  CL 104 102  CO2 26 28  GLUCOSE 93 118*  BUN 16 17  CREATININE 1.13 0.95  CALCIUM 9.0 9.1   GFR: Estimated Creatinine Clearance: 56.3 mL/min (by C-G formula based on SCr of 0.95 mg/dL). Liver Function Tests: Recent Labs  Lab  05/20/19 0629  AST 23  ALT 25  ALKPHOS 68  BILITOT 0.5  PROT 6.7  ALBUMIN 3.7   No results for input(s): LIPASE, AMYLASE in the last 168 hours. No results for input(s): AMMONIA in the last 168 hours. Coagulation Profile: No results for input(s): INR, PROTIME in the last 168 hours. Cardiac Enzymes: No results for input(s): CKTOTAL, CKMB, CKMBINDEX, TROPONINI in the last 168 hours. BNP (last 3 results) No results for input(s): PROBNP in the last 8760 hours. HbA1C: No results for input(s): HGBA1C in the last 72 hours. CBG: No results for input(s): GLUCAP in the last 168 hours. Lipid Profile: No results for input(s): CHOL, HDL, LDLCALC, TRIG, CHOLHDL, LDLDIRECT in the last 72 hours. Thyroid Function Tests: No results for input(s): TSH, T4TOTAL, FREET4, T3FREE, THYROIDAB in the last 72 hours. Anemia Panel: No results for input(s): VITAMINB12, FOLATE, FERRITIN, TIBC, IRON, RETICCTPCT in the last 72 hours. Sepsis Labs: Recent Labs  Lab 05/20/19 [id number]  LATICACIDVEN 1.4    Recent Results (from the past 240 hour(s))  Respiratory Panel by RT PCR (Flu A&B, Covid) - Nasopharyngeal Swab     Status: None   Collection Time: 05/20/19  4:55 AM   Specimen: Nasopharyngeal Swab  Result Value Ref Range Status   SARS Coronavirus 2 by RT PCR NEGATIVE NEGATIVE Final    Comment: (NOTE) SARS-CoV-2 target nucleic acids are NOT DETECTED. The SARS-CoV-2 RNA is generally detectable in upper respiratoy specimens during the acute phase of infection. The lowest concentration of SARS-CoV-2 viral copies this assay can detect is 131 copies/mL. A negative result does not preclude SARS-Cov-2 infection and should not be used as the sole basis for treatment or other patient management decisions. A negative result may occur with  improper specimen collection/handling, submission of specimen other than nasopharyngeal swab, presence of viral mutation(s) within the areas targeted by this assay, and inadequate  number of viral copies (<131 copies/mL). A negative result must be combined with clinical observations, patient history, and epidemiological information. The expected result is Negative. Fact Sheet for Patients:  PinkCheek.be Fact Sheet for Healthcare Providers:  GravelBags.it This test is not yet ap proved or cleared by the [place] FDA and  has been authorized for detection and/or diagnosis of SARS-CoV-2 by FDA under an Emergency Use Authorization (EUA). This EUA will remain  in effect (meaning this test can be used) for the duration of the COVID-19 declaration under Section 564(b)(1) of the Act, 21 U.S.C. section 360bbb-3(b)(1), unless the authorization is terminated or revoked sooner.    Influenza A by PCR NEGATIVE NEGATIVE Final   Influenza B by PCR NEGATIVE NEGATIVE Final    Comment: (NOTE) The Xpert Xpress SARS-CoV-2/FLU/RSV assay is intended as an aid in  the diagnosis of influenza from Nasopharyngeal swab specimens and  should not be used as a sole basis for treatment. Nasal washings and  aspirates are unacceptable for Xpert Xpress SARS-CoV-2/FLU/RSV  testing.  Fact Sheet for Patients: PinkCheek.be Fact Sheet for Healthcare Providers: GravelBags.it This test is not yet approved or cleared by the [place] FDA and  has been authorized for detection and/or diagnosis of SARS-CoV-2 by  FDA under an Emergency Use Authorization (EUA). This EUA will remain  in effect (meaning this test can be used) for the duration of the  Covid-19 declaration under Section 564(b)(1) of the Act, 21  U.S.C. section 360bbb-3(b)(1), unless the authorization is  terminated or revoked. Performed at [facility], [address] [place], [place] [address]   Culture, blood (Routine X 2) w Reflex to ID Panel     Status: None (Preliminary result)   Collection Time:  05/20/19  8:12 AM   Specimen: BLOOD  Result Value Ref Range Status   Specimen Description BLOOD LEFT ANTECUBITAL  Final   Special Requests   Final    BOTTLES DRAWN AEROBIC AND ANAEROBIC Blood Culture adequate volume   Culture   Final    NO GROWTH < 24 HOURS Performed at [facility], [address] [place], [place] [address]    Report Status PENDING  Incomplete  Culture, blood (Routine X 2) w Reflex to ID Panel     Status: None (Preliminary result)   Collection Time: 05/20/19  8:12 AM   Specimen: BLOOD  Result Value Ref Range Status   Specimen Description BLOOD BLOOD LEFT FOREARM  Final   Special Requests   Final    BOTTLES DRAWN AEROBIC AND ANAEROBIC Blood Culture adequate volume   Culture   Final    NO GROWTH < 24 HOURS Performed at [facility], [address], [place], [place] [address]    Report Status PENDING  Incomplete  Culture, sputum-assessment     Status: None   Collection Time: 05/21/19  9:52 AM   Specimen: Expectorated Sputum  Result Value Ref Range Status   Specimen Description EXPECTORATED SPUTUM  Final   Special Requests NONE  Final   Sputum evaluation   Final    Sputum specimen not acceptable for testing.  Please recollect.   SPOKE WITH STEVE IMHOFF AT 1128 05/21/19 REQUESTED RECOLLECTION SDR Performed at [facility], [address] [place], [place] [address]    Report Status 05/21/2019 FINAL  Final     Radiology Studies: DG Chest Portable 1 View  Result Date: 05/20/2019 CLINICAL DATA:  Shortness of breath, concern for COPD exacerbation EXAM: PORTABLE CHEST 1 VIEW COMPARISON:  Radiograph 11/10/2018, CT 01/31/2012 FINDINGS: Relative apical lucency with some coarse interstitial changes compatible with features of emphysema better seen on comparison CT. There is some more patchy opacity in the left lung base as well as diffuse mild airways thickening. Chronic left perihilar scarring is noted. Cardiomediastinal contours are stable. No  pneumothorax or visible effusion. No acute osseous or soft tissue abnormality. Degenerative changes are present in the imaged spine and shoulders. Telemetry leads overlie the chest. IMPRESSION: 1. Patchy opacity in the left lung base as well as diffuse mild airways thickening, could reflect acute infection or inflammation on more chronic bronchitic changes. 2. Emphysematous changes, better seen on comparison CT. Electronically Signed   By: Lovena Le M.D.   On: 05/20/2019 05:51    Scheduled Meds: . amLODipine  5 mg Oral Daily   And  . benazepril  20 mg Oral Daily  . [START ON 05/22/2019] azithromycin  500 mg Oral Daily  . dextromethorphan-guaiFENesin  1 tablet Oral BID  . divalproex  500 mg  Oral QHS  . donepezil  5 mg Oral QHS  . enoxaparin (LOVENOX) injection  40 mg Subcutaneous Q24H  . haloperidol  2.5 mg Oral Daily  . ipratropium-albuterol  3 mL Nebulization Q4H  . methylPREDNISolone (SOLU-MEDROL) injection  40 mg Intravenous Q12H  . pneumococcal 23 valent vaccine  0.5 mL Intramuscular Tomorrow-1000  . QUEtiapine  800 mg Oral QHS  . traZODone  300 mg Oral QHS  . trihexyphenidyl  5 mg Oral TID WC  . vitamin B-12  1,000 mcg Oral Daily   Continuous Infusions: . cefTRIAXone (ROCEPHIN)  IV 2 g (05/21/19 0851)     LOS: 1 day   Time spent: 45 minutes  Lorella Nimrod, MD [facility]  If 7PM-7AM, please contact night-coverage [email or web address]  05/21/2019, 1:26 PM   This record has been created using Systems analyst. Errors have been sought and corrected,but may not always be located. Such creation errors do not reflect on the standard of care.

## 2019-05-22 MED ORDER — DM-GUAIFENESIN ER 30-600 MG PO TB12: 1.0000 | ORAL_TABLET | Freq: Two times a day (BID) | ORAL | 0 refills | Status: DC

## 2019-05-22 MED ORDER — PREDNISONE 20 MG PO TABS: 40.0000 mg | ORAL_TABLET | Freq: Every day | ORAL | 0 refills | Status: AC

## 2019-05-22 MED ORDER — TRIHEXYPHENIDYL HCL 5 MG PO TABS: 5.0000 mg | ORAL_TABLET | Freq: Three times a day (TID) | ORAL | 0 refills | Status: DC

## 2019-05-22 MED ORDER — LEVOFLOXACIN 750 MG PO TABS
750.0000 mg | ORAL_TABLET | Freq: Every day | ORAL | 0 refills | Status: AC
Start: 2019-05-22 — End: 2019-05-29

## 2019-05-22 NOTE — Discharge Instructions (Signed)
Chronic Obstructive Pulmonary Disease Chronic obstructive pulmonary disease (COPD) is a long-term (chronic) lung problem. When you have COPD, it is hard for air to get in and out of your lungs. Usually the condition gets worse over time, and your lungs will never return to normal. There are things you can do to keep yourself as healthy as possible.  Your doctor may treat your condition with: ? Medicines. ? Oxygen. ? Lung surgery.  Your doctor may also recommend: ? Rehabilitation. This includes steps to make your body work better. It may involve a team of specialists. ? Quitting smoking, if you smoke. ? Exercise and changes to your diet. ? Comfort measures (palliative care). Follow these instructions at home: Medicines  Take over-the-counter and prescription medicines only as told by your doctor.  Talk to your doctor before taking any cough or allergy medicines. You may need to avoid medicines that cause your lungs to be dry. Lifestyle  If you smoke, stop. Smoking makes the problem worse. If you need help quitting, ask your doctor.  Avoid being around things that make your breathing worse. This may include smoke, chemicals, and fumes.  Stay active, but remember to rest as well.  Learn and use tips on how to relax.  Make sure you get enough sleep. Most adults need at least 7 hours of sleep every night.  Eat healthy foods. Eat smaller meals more often. Rest before meals. Controlled breathing Learn and use tips on how to control your breathing as told by your doctor. Try:  Breathing in (inhaling) through your nose for 1 second. Then, pucker your lips and breath out (exhale) through your lips for 2 seconds.  Putting one hand on your belly (abdomen). Breathe in slowly through your nose for 1 second. Your hand on your belly should move out. Pucker your lips and breathe out slowly through your lips. Your hand on your belly should move in as you breathe out.  Controlled coughing Learn  and use controlled coughing to clear mucus from your lungs. Follow these steps: 1. Lean your head a little forward. 2. Breathe in deeply. 3. Try to hold your breath for 3 seconds. 4. Keep your mouth slightly open while coughing 2 times. 5. Spit any mucus out into a tissue. 6. Rest and do the steps again 1 or 2 times as needed. General instructions  Make sure you get all the shots (vaccines) that your doctor recommends. Ask your doctor about a flu shot and a pneumonia shot.  Use oxygen therapy and pulmonary rehabilitation if told by your doctor. If you need home oxygen therapy, ask your doctor if you should buy a tool to measure your oxygen level (oximeter).  Make a COPD action plan with your doctor. This helps you to know what to do if you feel worse than usual.  Manage any other conditions you have as told by your doctor.  Avoid going outside when it is very hot, cold, or humid.  Avoid people who have a sickness you can catch (contagious).  Keep all follow-up visits as told by your doctor. This is important. Contact a doctor if:  You cough up more mucus than usual.  There is a change in the color or thickness of the mucus.  It is harder to breathe than usual.  Your breathing is faster than usual.  You have trouble sleeping.  You need to use your medicines more often than usual.  You have trouble doing your normal activities such as getting dressed   or walking around the house. Get help right away if:  You have shortness of breath while resting.  You have shortness of breath that stops you from: ? Being able to talk. ? Doing normal activities.  Your chest hurts for longer than 5 minutes.  Your skin color is more blue than usual.  Your pulse oximeter shows that you have low oxygen for longer than 5 minutes.  You have a fever.  You feel too tired to breathe normally. Summary  Chronic obstructive pulmonary disease (COPD) is a long-term lung problem.  The way your  lungs work will never return to normal. Usually the condition gets worse over time. There are things you can do to keep yourself as healthy as possible.  Take over-the-counter and prescription medicines only as told by your doctor.  If you smoke, stop. Smoking makes the problem worse. This information is not intended to replace advice given to you by your health care provider. Make sure you discuss any questions you have with your health care provider. Document Revised: 12/14/2016 Document Reviewed: 02/06/2016 Elsevier Patient Education  2020 [employer]vier Inc.  

## 2019-05-22 NOTE — Care Management Important Message (Signed)
Important Message  Patient Details  Name: Carlos Nelson MRN: [id number] Date of Birth: [birth year]   Medicare Important Message Given:  Yes     Dannette Barbara 05/22/2019, 1:33 PM

## 2019-05-22 NOTE — Discharge Summary (Signed)
Physician Discharge Summary  Carlos Nelson [id number] DOB: [birth year] DOA: 05/20/2019  PCP: Jodi Marble, MD  Admit date: 05/20/2019 Discharge date: 05/22/2019  Admitted From: Group home Disposition: Group home  Recommendations for Outpatient Follow-up:  1. Follow up with PCP in 1-2 weeks 2. Please obtain BMP/CBC in one week 3. Please follow up on the following pending results: None  Home Health: No Equipment/Devices: None  discharge Condition: Stable CODE STATUS: Full Diet recommendation: Heart Healthy   Brief/Interim Summary: Carlos Nelson a 69 y.o.malewith medical history significant ofhypertension, COPD, schizophrenia, who presents with shortness breath.  Patient has been having shortness breath in the past 2 days,whichhas been progressively worsening. Pt hascough, mostly dry cough. He also reports pleuritic chest pain, which is located in the front chest, 6 out of 10 severity, aggravated by deep breath, nonradiating. Patient was found to have oxygen saturation 87% on room air, which improved to 96% on BiPAP.  Chest x-ray with patchy infiltrate in left basilar area and patient admitted for COPD exacerbation with Possible CAP.  He was treated with azithromycin and ceftriaxone in the hospital and discharged on Levaquin to complete the course.  He was quickly able to weaned off from BiPAP and then oxygen.  Saturating in low 90s on room air.  Need a chest x-ray in 4 weeks to see the resolution of infiltrate.  He will continue rest of his home meds.  Discharge Diagnoses:  Principal Problem:   COPD exacerbation ([place]) Active Problems:   Schizophrenia ([place])   Hypertension   Acute on chronic respiratory failure with hypoxia (HCC)   Pleuritic chest pain   CAP (community acquired pneumonia)   Discharge Instructions  Discharge Instructions    Diet - low sodium heart healthy   Complete by: As directed    Increase activity slowly   Complete by: As directed       Allergies as of 05/22/2019      Reactions   Ace Inhibitors Swelling   Ok with benazapril, per grouphome      Medication List    TAKE these medications   Abilify Maintena 400 MG Prsy prefilled syringe Generic drug: ARIPiprazole ER Inject 400 mg into the muscle every 30 (thirty) days.   albuterol 108 (90 Base) MCG/ACT inhaler Commonly known as: VENTOLIN HFA Inhale 1 puff into the lungs every 6 (six) hours as needed for wheezing or shortness of breath.   albuterol (2.5 MG/3ML) 0.083% nebulizer solution Commonly known as: PROVENTIL Take 2.5 mg by nebulization every 6 (six) hours as needed for wheezing or shortness of breath.   amLODipine-benazepril 5-20 MG capsule Commonly known as: LOTREL Take 1 capsule by mouth daily.   Combivent Respimat 20-100 MCG/ACT Aers respimat Generic drug: Ipratropium-Albuterol Inhale 1 puff into the lungs every 6 (six) hours as needed for wheezing or shortness of breath.   dextromethorphan-guaiFENesin 30-600 MG 12hr tablet Commonly known as: MUCINEX DM Take 1 tablet by mouth 2 (two) times daily.   divalproex 250 MG 24 hr tablet Commonly known as: DEPAKOTE ER Take 500 mg by mouth at bedtime.   donepezil 5 MG tablet Commonly known as: ARICEPT Take 5 mg by mouth at bedtime.   haloperidol 5 MG tablet Commonly known as: HALDOL Take 2.5 mg by mouth daily.   hydrOXYzine 50 MG tablet Commonly known as: ATARAX/VISTARIL Take 50 mg by mouth every 6 (six) hours as needed for anxiety or itching.   levofloxacin 750 MG tablet Commonly known as: Levaquin Take 1 tablet (  750 mg total) by mouth daily for 7 days.   LORazepam 1 MG tablet Commonly known as: ATIVAN Take 1 tablet (1 mg total) by mouth every 6 (six) hours as needed for anxiety.   predniSONE 20 MG tablet Commonly known as: DELTASONE Take 2 tablets (40 mg total) by mouth daily with breakfast for 3 days.   QUEtiapine 400 MG tablet Commonly known as: SEROQUEL Take 800 mg by mouth at  bedtime.   traZODone 150 MG tablet Commonly known as: DESYREL Take 300 mg by mouth at bedtime.   trihexyphenidyl 5 MG tablet Commonly known as: ARTANE Take 1 tablet (5 mg total) by mouth 3 (three) times daily with meals.   vitamin B-12 1000 MCG tablet Commonly known as: CYANOCOBALAMIN Take 1,000 mcg by mouth daily.      Follow-up Information    Jodi Marble, MD. Schedule an appointment as soon as possible for a visit.   Specialty: Internal Medicine Contact information: [place] [address] [phone]          Allergies  Allergen Reactions  . Ace Inhibitors Swelling    Ok with benazapril, per grouphome    Consultations:  None  Procedures/Studies: DG Chest Portable 1 View  Result Date: 05/20/2019 CLINICAL DATA:  Shortness of breath, concern for COPD exacerbation EXAM: PORTABLE CHEST 1 VIEW COMPARISON:  Radiograph 11/10/2018, CT 01/31/2012 FINDINGS: Relative apical lucency with some coarse interstitial changes compatible with features of emphysema better seen on comparison CT. There is some more patchy opacity in the left lung base as well as diffuse mild airways thickening. Chronic left perihilar scarring is noted. Cardiomediastinal contours are stable. No pneumothorax or visible effusion. No acute osseous or soft tissue abnormality. Degenerative changes are present in the imaged spine and shoulders. Telemetry leads overlie the chest. IMPRESSION: 1. Patchy opacity in the left lung base as well as diffuse mild airways thickening, could reflect acute infection or inflammation on more chronic bronchitic changes. 2. Emphysematous changes, better seen on comparison CT. Electronically Signed   By: Lovena Le M.D.   On: 05/20/2019 05:51    Subjective: Patient has no new complaints today.  He wants to go back home.  Discharge Exam: Vitals:   05/22/19 0400 05/22/19 0811  BP: (!) 158/95 (!) 147/91  Pulse: (!) 104 86  Resp: 20 19  Temp: 98.6 F (37  C) 98.3 F (36.8 C)  SpO2:  91%   Vitals:   05/21/19 2043 05/22/19 0400 05/22/19 0403 05/22/19 0811  BP:  (!) 158/95  (!) 147/91  Pulse:  (!) 104  86  Resp:  20  19  Temp:  98.6 F (37 C)  98.3 F (36.8 C)  TempSrc:  Oral  Oral  SpO2: 91%   91%  Weight:   55.3 kg   Height:        General: Pt is alert, awake, not in acute distress Cardiovascular: RRR, S1/S2 +, no rubs, no gallops Respiratory: CTA bilaterally, no wheezing, no rhonchi Abdominal: Soft, NT, ND, bowel sounds + Extremities: no edema, no cyanosis   The results of significant diagnostics from this hospitalization (including imaging, microbiology, ancillary and laboratory) are listed below for reference.    Microbiology: Recent Results (from the past 240 hour(s))  Respiratory Panel by RT PCR (Flu A&B, Covid) - Nasopharyngeal Swab     Status: None   Collection Time: 05/20/19  4:55 AM   Specimen: Nasopharyngeal Swab  Result Value Ref Range Status   SARS Coronavirus  2 by RT PCR NEGATIVE NEGATIVE Final    Comment: (NOTE) SARS-CoV-2 target nucleic acids are NOT DETECTED. The SARS-CoV-2 RNA is generally detectable in upper respiratoy specimens during the acute phase of infection. The lowest concentration of SARS-CoV-2 viral copies this assay can detect is 131 copies/mL. A negative result does not preclude SARS-Cov-2 infection and should not be used as the sole basis for treatment or other patient management decisions. A negative result may occur with  improper specimen collection/handling, submission of specimen other than nasopharyngeal swab, presence of viral mutation(s) within the areas targeted by this assay, and inadequate number of viral copies (<131 copies/mL). A negative result must be combined with clinical observations, patient history, and epidemiological information. The expected result is Negative. Fact Sheet for Patients:  PinkCheek.be Fact Sheet for Healthcare Providers:   GravelBags.it This test is not yet ap proved or cleared by the [place] FDA and  has been authorized for detection and/or diagnosis of SARS-CoV-2 by FDA under an Emergency Use Authorization (EUA). This EUA will remain  in effect (meaning this test can be used) for the duration of the COVID-19 declaration under Section 564(b)(1) of the Act, 21 U.S.C. section 360bbb-3(b)(1), unless the authorization is terminated or revoked sooner.    Influenza A by PCR NEGATIVE NEGATIVE Final   Influenza B by PCR NEGATIVE NEGATIVE Final    Comment: (NOTE) The Xpert Xpress SARS-CoV-2/FLU/RSV assay is intended as an aid in  the diagnosis of influenza from Nasopharyngeal swab specimens and  should not be used as a sole basis for treatment. Nasal washings and  aspirates are unacceptable for Xpert Xpress SARS-CoV-2/FLU/RSV  testing. Fact Sheet for Patients: PinkCheek.be Fact Sheet for Healthcare Providers: GravelBags.it This test is not yet approved or cleared by the [place] FDA and  has been authorized for detection and/or diagnosis of SARS-CoV-2 by  FDA under an Emergency Use Authorization (EUA). This EUA will remain  in effect (meaning this test can be used) for the duration of the  Covid-19 declaration under Section 564(b)(1) of the Act, 21  U.S.C. section 360bbb-3(b)(1), unless the authorization is  terminated or revoked. Performed at [facility], [address] [place], [place] [address]   Culture, blood (Routine X 2) w Reflex to ID Panel     Status: None (Preliminary result)   Collection Time: 05/20/19  8:12 AM   Specimen: BLOOD  Result Value Ref Range Status   Specimen Description BLOOD LEFT ANTECUBITAL  Final   Special Requests   Final    BOTTLES DRAWN AEROBIC AND ANAEROBIC Blood Culture adequate volume   Culture   Final    NO GROWTH 2 DAYS Performed at [facility], [address] [place], [place] [address]    Report Status PENDING  Incomplete  Culture, blood (Routine X 2) w Reflex to ID Panel     Status: None (Preliminary result)   Collection Time: 05/20/19  8:12 AM   Specimen: BLOOD  Result Value Ref Range Status   Specimen Description BLOOD BLOOD LEFT FOREARM  Final   Special Requests   Final    BOTTLES DRAWN AEROBIC AND ANAEROBIC Blood Culture adequate volume   Culture   Final    NO GROWTH 2 DAYS Performed at [facility], [address] [place], [place] [address]    Report Status PENDING  Incomplete  Culture, sputum-assessment     Status: None   Collection Time: 05/21/19  9:52 AM   Specimen: Expectorated Sputum  Result Value Ref Range Status   Specimen Description EXPECTORATED SPUTUM  Final   Special Requests NONE  Final   Sputum evaluation   Final    Sputum specimen not acceptable for testing.  Please recollect.   SPOKE WITH STEVE IMHOFF AT 1128 05/21/19 REQUESTED RECOLLECTION SDR Performed at [facility], [address] [place], [place] [address]    Report Status 05/21/2019 FINAL  Final     Labs: BNP (last 3 results) Recent Labs    05/20/19 0455  BNP [id number]   Basic Metabolic Panel: Recent Labs  Lab 05/20/19 0629 05/21/19 0441  NA 139 138  K 3.7 4.2  CL 104 102  CO2 26 28  GLUCOSE 93 118*  BUN 16 17  CREATININE 1.13 0.95  CALCIUM 9.0 9.1   Liver Function Tests: Recent Labs  Lab 05/20/19 0629  AST 23  ALT 25  ALKPHOS 68  BILITOT 0.5  PROT 6.7  ALBUMIN 3.7   No results for input(s): LIPASE, AMYLASE in the last 168 hours. No results for input(s): AMMONIA in the last 168 hours. CBC: Recent Labs  Lab 05/20/19 0455 05/21/19 0441  WBC 6.9 9.9  HGB 15.4 14.4  HCT 45.5 42.0  MCV 89.2 87.1  PLT 207 185   Cardiac Enzymes: No results for input(s): CKTOTAL, CKMB, CKMBINDEX, TROPONINI in the last 168 hours. BNP: Invalid input(s): POCBNP CBG: No results for input(s): GLUCAP in the last 168  hours. D-Dimer No results for input(s): DDIMER in the last 72 hours. Hgb A1c No results for input(s): HGBA1C in the last 72 hours. Lipid Profile No results for input(s): CHOL, HDL, LDLCALC, TRIG, CHOLHDL, LDLDIRECT in the last 72 hours. Thyroid function studies No results for input(s): TSH, T4TOTAL, T3FREE, THYROIDAB in the last 72 hours.  Invalid input(s): FREET3 Anemia work up No results for input(s): VITAMINB12, FOLATE, FERRITIN, TIBC, IRON, RETICCTPCT in the last 72 hours. Urinalysis    Component Value Date/Time   COLORURINE STRAW (A) 11/10/2018 1451   APPEARANCEUR CLEAR (A) 11/10/2018 1451   APPEARANCEUR Clear 11/09/2013 1525   LABSPEC 1.010 11/10/2018 1451   LABSPEC 1.014 11/09/2013 1525   PHURINE 7.0 11/10/2018 1451   GLUCOSEU NEGATIVE 11/10/2018 1451   GLUCOSEU Negative 11/09/2013 1525   HGBUR NEGATIVE 11/10/2018 [place] 11/10/2018 1451   BILIRUBINUR Negative 11/09/2013 1525   KETONESUR NEGATIVE 11/10/2018 1451   PROTEINUR NEGATIVE 11/10/2018 1451   NITRITE NEGATIVE 11/10/2018 1451   LEUKOCYTESUR NEGATIVE 11/10/2018 1451   LEUKOCYTESUR Negative 11/09/2013 1525   Sepsis Labs Invalid input(s): PROCALCITONIN,  WBC,  LACTICIDVEN Microbiology Recent Results (from the past 240 hour(s))  Respiratory Panel by RT PCR (Flu A&B, Covid) - Nasopharyngeal Swab     Status: None   Collection Time: 05/20/19  4:55 AM   Specimen: Nasopharyngeal Swab  Result Value Ref Range Status   SARS Coronavirus 2 by RT PCR NEGATIVE NEGATIVE Final    Comment: (NOTE) SARS-CoV-2 target nucleic acids are NOT DETECTED. The SARS-CoV-2 RNA is generally detectable in upper respiratoy specimens during the acute phase of infection. The lowest concentration of SARS-CoV-2 viral copies this assay can detect is 131 copies/mL. A negative result does not preclude SARS-Cov-2 infection and should not be used as the sole basis for treatment or other patient management decisions. A negative  result may occur with  improper specimen collection/handling, submission of specimen other than nasopharyngeal swab, presence of viral mutation(s) within the areas targeted by this assay, and inadequate number of viral  copies (<131 copies/mL). A negative result must be combined with clinical observations, patient history, and epidemiological information. The expected result is Negative. Fact Sheet for Patients:  PinkCheek.be Fact Sheet for Healthcare Providers:  GravelBags.it This test is not yet ap proved or cleared by the [place] FDA and  has been authorized for detection and/or diagnosis of SARS-CoV-2 by FDA under an Emergency Use Authorization (EUA). This EUA will remain  in effect (meaning this test can be used) for the duration of the COVID-19 declaration under Section 564(b)(1) of the Act, 21 U.S.C. section 360bbb-3(b)(1), unless the authorization is terminated or revoked sooner.    Influenza A by PCR NEGATIVE NEGATIVE Final   Influenza B by PCR NEGATIVE NEGATIVE Final    Comment: (NOTE) The Xpert Xpress SARS-CoV-2/FLU/RSV assay is intended as an aid in  the diagnosis of influenza from Nasopharyngeal swab specimens and  should not be used as a sole basis for treatment. Nasal washings and  aspirates are unacceptable for Xpert Xpress SARS-CoV-2/FLU/RSV  testing. Fact Sheet for Patients: PinkCheek.be Fact Sheet for Healthcare Providers: GravelBags.it This test is not yet approved or cleared by the [place] FDA and  has been authorized for detection and/or diagnosis of SARS-CoV-2 by  FDA under an Emergency Use Authorization (EUA). This EUA will remain  in effect (meaning this test can be used) for the duration of the  Covid-19 declaration under Section 564(b)(1) of the Act, 21  U.S.C. section 360bbb-3(b)(1), unless the authorization is  terminated or  revoked. Performed at [facility], [address] [place], [place] [address]   Culture, blood (Routine X 2) w Reflex to ID Panel     Status: None (Preliminary result)   Collection Time: 05/20/19  8:12 AM   Specimen: BLOOD  Result Value Ref Range Status   Specimen Description BLOOD LEFT ANTECUBITAL  Final   Special Requests   Final    BOTTLES DRAWN AEROBIC AND ANAEROBIC Blood Culture adequate volume   Culture   Final    NO GROWTH 2 DAYS Performed at [facility], [address], [place], [place] [address]    Report Status PENDING  Incomplete  Culture, blood (Routine X 2) w Reflex to ID Panel     Status: None (Preliminary result)   Collection Time: 05/20/19  8:12 AM   Specimen: BLOOD  Result Value Ref Range Status   Specimen Description BLOOD BLOOD LEFT FOREARM  Final   Special Requests   Final    BOTTLES DRAWN AEROBIC AND ANAEROBIC Blood Culture adequate volume   Culture   Final    NO GROWTH 2 DAYS Performed at [facility], [address], [place], [place] [address]    Report Status PENDING  Incomplete  Culture, sputum-assessment     Status: None   Collection Time: 05/21/19  9:52 AM   Specimen: Expectorated Sputum  Result Value Ref Range Status   Specimen Description EXPECTORATED SPUTUM  Final   Special Requests NONE  Final   Sputum evaluation   Final    Sputum specimen not acceptable for testing.  Please recollect.   SPOKE WITH STEVE IMHOFF AT 1128 05/21/19 REQUESTED RECOLLECTION SDR Performed at [facility], [address] [place], [place] [address]    Report Status 05/21/2019 FINAL  Final    Time coordinating discharge: Over 30 minutes  SIGNED:  Lorella Nimrod, MD  [facility] 05/22/2019, 10:34 AM  If 7PM-7AM, please contact night-coverage [email or web address]  This record has been created using  [employer] Armed forces training and education officer. Errors have been sought and corrected,but may not always be located. Such creation errors do  not reflect on the standard of care.

## 2019-05-22 NOTE — NC FL2 (Addendum)
[place] LEVEL OF CARE SCREENING TOOL     IDENTIFICATION  Patient Name: Carlos Nelson Birthdate: 10/05/1950 Sex: male Admission Date (Current Location): 05/20/2019  [place] and [place] Number:  Engineering geologist and Address:  [facility], [address] [place], [place] [address]      Provider Number: [id number]  Attending Physician Name and Address:  Lorella Nimrod, MD  Relative Name and Phone Number:  Meguel Helming [phone]    Current Level of Care: Hospital Recommended Level of Care: [place] Prior Approval Number:    Date Approved/Denied:   PASRR Number:    Discharge Plan: Other (Comment)([facility])    Current Diagnoses: Patient Active Problem List   Diagnosis Date Noted  . Pleuritic chest pain 05/20/2019  . CAP (community acquired pneumonia) 05/20/2019  . Hypertension   . COPD exacerbation ([place])   . Acute on chronic respiratory failure with hypoxia ([place])   . Altered mental status 09/25/2018  . Schizophrenia ([place])   . Tachycardia 08/21/2018  . Schizophrenia, chronic with acute exacerbation ([place]) 08/17/2018  . Elevated PSA 05/24/2015    Orientation RESPIRATION BLADDER Height & Weight     Self, Situation  Normal Continent Weight: 55.3 kg Height:  5\' 4"  (162.6 cm)  BEHAVIORAL SYMPTOMS/MOOD NEUROLOGICAL BOWEL NUTRITION STATUS      Continent    AMBULATORY STATUS COMMUNICATION OF NEEDS Skin     Verbally Normal                       Personal Care Assistance Level of Assistance              Functional Limitations Info             SPECIAL CARE FACTORS FREQUENCY                       Contractures Contractures Info: Not present    Additional Factors Info  Allergies   Allergies Info: Ace Inhibitors           Current Medications (05/22/2019):  This is the current hospital active medication list Current Facility-Administered Medications   Medication Dose Route Frequency Provider Last Rate Last Admin  . acetaminophen (TYLENOL) tablet 650 mg  650 mg Oral Q6H PRN Ivor Costa, MD      . albuterol (PROVENTIL) (2.5 MG/3ML) 0.083% nebulizer solution 2.5 mg  2.5 mg Nebulization Q4H PRN Ivor Costa, MD      . amLODipine (NORVASC) tablet 5 mg  5 mg Oral Daily Pernell Dupre, RPH   5 mg at 05/22/19 [id number]   And  . benazepril (LOTENSIN) tablet 20 mg  20 mg Oral Daily Pernell Dupre, RPH   20 mg at 05/22/19 0927  . azithromycin (ZITHROMAX) tablet 500 mg  500 mg Oral Daily Lu Duffel, RPH   500 mg at 05/22/19 [id number]  . cefTRIAXone (ROCEPHIN) 2 g in sodium chloride 0.9 % 100 mL IVPB  2 g Intravenous Q24H Ivor Costa, MD 200 mL/hr at 05/22/19 0927 2 g at 05/22/19 0927  . dextromethorphan-guaiFENesin (MUCINEX DM) 30-600 MG per 12 hr tablet 1 tablet  1 tablet Oral BID Ivor Costa, MD   1 tablet at 05/22/19 [id number]  . divalproex (DEPAKOTE ER) 24 hr tablet 500 mg  500 mg Oral QHS Ivor Costa, MD   500 mg at 05/21/19 2057  . donepezil (ARICEPT) tablet 5 mg  5 mg Oral  Gordan Payment, MD   5 mg at 05/21/19 2057  . enoxaparin (LOVENOX) injection 40 mg  40 mg Subcutaneous Q24H Ivor Costa, MD   40 mg at 05/22/19 [id number]  . haloperidol (HALDOL) tablet 2.5 mg  2.5 mg Oral Daily Ivor Costa, MD   2.5 mg at 05/22/19 [id number]  . hydrALAZINE (APRESOLINE) injection 5 mg  5 mg Intravenous Q2H PRN Ivor Costa, MD      . hydrOXYzine (ATARAX/VISTARIL) tablet 50 mg  50 mg Oral Q6H PRN Ivor Costa, MD      . ipratropium-albuterol (DUONEB) 0.5-2.5 (3) MG/3ML nebulizer solution 3 mL  3 mL Nebulization TID Lorella Nimrod, MD   3 mL at 05/22/19 [id number]  . LORazepam (ATIVAN) tablet 1 mg  1 mg Oral Q6H PRN Ivor Costa, MD      . ondansetron [facility]) injection 4 mg  4 mg Intravenous Q8H PRN Ivor Costa, MD      . pneumococcal 23 valent vaccine (PNEUMOVAX-23) injection 0.5 mL  0.5 mL Intramuscular Tomorrow-1000 Ivor Costa, MD      . predniSONE (DELTASONE) tablet 40 mg  40 mg Oral Q  breakfast Lorella Nimrod, MD   40 mg at 05/22/19 0927  . QUEtiapine (SEROQUEL) tablet 800 mg  800 mg Oral QHS Ivor Costa, MD   800 mg at 05/21/19 2054  . traZODone (DESYREL) tablet 300 mg  300 mg Oral QHS Ivor Costa, MD   300 mg at 05/21/19 2055  . trihexyphenidyl (ARTANE) tablet 5 mg  5 mg Oral TID WC Ivor Costa, MD   5 mg at 05/22/19 0926  . vitamin B-12 (CYANOCOBALAMIN) tablet 1,000 mcg  1,000 mcg Oral Daily Ivor Costa, MD   1,000 mcg at 05/22/19 [id number]     Discharge Medications: Please see discharge summary for a list of discharge medications.  Relevant Imaging Results:  Relevant Lab Results:   Additional Information    Victorino Dike, RN

## 2019-05-22 NOTE — Progress Notes (Signed)
Discharge instructions explained to pt/ verbalized an understanding/ ivs and tele removed/ group home to provide transportation

## 2019-05-23 ENCOUNTER — Inpatient Hospital Stay
Admission: EM | Admit: 2019-05-23 | Discharge: 2019-05-25 | DRG: 190 | Disposition: A | Discharge status: Home / Self Care | Payer: Medicare Other | Attending: Internal Medicine | Admitting: Internal Medicine

## 2019-05-23 ENCOUNTER — Emergency Department: Payer: Medicare Other

## 2019-05-23 DIAGNOSIS — Z87891 Personal history of nicotine dependence: Secondary | ICD-10-CM

## 2019-05-23 DIAGNOSIS — Z79890 Hormone replacement therapy (postmenopausal): Secondary | ICD-10-CM

## 2019-05-23 DIAGNOSIS — J9621 Acute and chronic respiratory failure with hypoxia: Secondary | ICD-10-CM | POA: Diagnosis present

## 2019-05-23 DIAGNOSIS — R413 Other amnesia: Secondary | ICD-10-CM | POA: Diagnosis present

## 2019-05-23 DIAGNOSIS — Z9981 Dependence on supplemental oxygen: Secondary | ICD-10-CM

## 2019-05-23 DIAGNOSIS — Z888 Allergy status to other drugs, medicaments and biological substances status: Secondary | ICD-10-CM

## 2019-05-23 DIAGNOSIS — Z79899 Other long term (current) drug therapy: Secondary | ICD-10-CM

## 2019-05-23 DIAGNOSIS — E538 Deficiency of other specified B group vitamins: Secondary | ICD-10-CM | POA: Diagnosis present

## 2019-05-23 DIAGNOSIS — J441 Chronic obstructive pulmonary disease with (acute) exacerbation: Secondary | ICD-10-CM | POA: Diagnosis present

## 2019-05-23 DIAGNOSIS — J189 Pneumonia, unspecified organism: Secondary | ICD-10-CM | POA: Diagnosis present

## 2019-05-23 DIAGNOSIS — Z7952 Long term (current) use of systemic steroids: Secondary | ICD-10-CM

## 2019-05-23 DIAGNOSIS — F209 Schizophrenia, unspecified: Secondary | ICD-10-CM | POA: Diagnosis present

## 2019-05-23 DIAGNOSIS — R079 Chest pain, unspecified: Secondary | ICD-10-CM

## 2019-05-23 DIAGNOSIS — I10 Essential (primary) hypertension: Secondary | ICD-10-CM | POA: Diagnosis present

## 2019-05-23 DIAGNOSIS — J439 Emphysema, unspecified: Principal | ICD-10-CM | POA: Diagnosis present

## 2019-05-23 DIAGNOSIS — Z20822 Contact with and (suspected) exposure to covid-19: Secondary | ICD-10-CM | POA: Diagnosis present

## 2019-05-23 HISTORY — DX: Chronic respiratory failure with hypoxia: J96.11

## 2019-05-23 LAB — COMPREHENSIVE METABOLIC PANEL
ALT: 50 U/L — ABNORMAL HIGH (ref 0–44)
AST: 35 U/L (ref 15–41)
Albumin: 3.7 g/dL (ref 3.5–5.0)
Alkaline Phosphatase: 72 U/L (ref 38–126)
Anion gap: 5 (ref 5–15)
BUN: 17 mg/dL (ref 8–23)
CO2: 30 mmol/L (ref 22–32)
Calcium: 9.4 mg/dL (ref 8.9–10.3)
Chloride: 104 mmol/L (ref 98–111)
Creatinine, Ser: 1.02 mg/dL (ref 0.61–1.24)
GFR calc Af Amer: >60 mL/min (ref >60)
GFR calc non Af Amer: >60 mL/min (ref >60)
Glucose, Bld: 127 mg/dL — ABNORMAL HIGH (ref 70–99)
Potassium: 4 mmol/L (ref 3.5–5.1)
Sodium: 139 mmol/L (ref 135–145)
Total Bilirubin: 0.6 mg/dL (ref 0.3–1.2)
Total Protein: 6.5 g/dL (ref 6.5–8.1)

## 2019-05-23 LAB — CBC WITH DIFFERENTIAL/PLATELET
Abs Immature Granulocytes: 0.02 10*3/uL (ref 0.00–0.07)
Basophils Absolute: 0 10*3/uL (ref 0.0–0.1)
Basophils Relative: 0 %
Eosinophils Absolute: 0.1 10*3/uL (ref 0.0–0.5)
Eosinophils Relative: 1 %
HCT: 46.3 % (ref 39.0–52.0)
Hemoglobin: 15.5 g/dL (ref 13.0–17.0)
Immature Granulocytes: 0 %
Lymphocytes Relative: 26 %
Lymphs Abs: 2 10*3/uL (ref 0.7–4.0)
MCH: 29.7 pg (ref 26.0–34.0)
MCHC: 33.5 g/dL (ref 30.0–36.0)
MCV: 88.7 fL (ref 80.0–100.0)
Monocytes Absolute: 0.6 10*3/uL (ref 0.1–1.0)
Monocytes Relative: 8 %
Neutro Abs: 5 10*3/uL (ref 1.7–7.7)
Neutrophils Relative %: 65 %
Platelets: 214 10*3/uL (ref 150–400)
RBC: 5.22 MIL/uL (ref 4.22–5.81)
RDW: 13.7 % (ref 11.5–15.5)
WBC: 7.7 10*3/uL (ref 4.0–10.5)
nRBC: 0 % (ref 0.0–0.2)

## 2019-05-23 LAB — TROPONIN I (HIGH SENSITIVITY): Troponin I (High Sensitivity): 5 ng/L (ref <18)

## 2019-05-23 MED ORDER — IPRATROPIUM-ALBUTEROL 0.5-2.5 (3) MG/3ML IN SOLN
3.0000 mL | Freq: Once | RESPIRATORY_TRACT | Status: AC
  Administered 2019-05-23: 3 mL via RESPIRATORY_TRACT
  Filled 2019-05-23: qty 3

## 2019-05-23 MED ORDER — HYDROCODONE-ACETAMINOPHEN 5-325 MG PO TABS
1.0000 | ORAL_TABLET | Freq: Once | ORAL | Status: AC
  Administered 2019-05-23: 1 via ORAL
  Filled 2019-05-23: qty 1

## 2019-05-23 MED ORDER — PREDNISONE 20 MG PO TABS
40.0000 mg | ORAL_TABLET | Freq: Once | ORAL | Status: AC
  Administered 2019-05-24: 40 mg via ORAL
  Filled 2019-05-23: qty 2

## 2019-05-23 MED ORDER — IPRATROPIUM-ALBUTEROL 0.5-2.5 (3) MG/3ML IN SOLN
3.0000 mL | Freq: Once | RESPIRATORY_TRACT | Status: DC
  Administered 2019-05-24: 3 mL via RESPIRATORY_TRACT

## 2019-05-23 NOTE — ED Provider Notes (Signed)
[facility] Emergency Department Provider Note    First MD Initiated Contact with Patient 05/23/19 2207     (approximate)  I have reviewed the triage vital signs and the nursing notes.   HISTORY  Chief Complaint Shortness of Breath and Chest Pain  Level v Caveat:  Memory loss poor historian  HPI Carlos Nelson is a 69 y.o. male history of COPD wears chronic O2 presents to the ER from group home for worsening shortness of breath.  Reportedly was unable to get his medications filled over the weekend.  Patient very poor historian.  Does feel short of breath however is complaining of some chest discomfort.    Past Medical History:  Diagnosis Date  . COPD (chronic obstructive pulmonary disease) ([place])   . Hypertension   . Schizophrenia ([place])    Family History  Problem Relation Age of Onset  . Prostate cancer Neg Hx   . Bladder Cancer Neg Hx    Past Surgical History:  Procedure Laterality Date  . COLONOSCOPY    . COLONOSCOPY WITH PROPOFOL N/A 12/13/2016   Procedure: COLONOSCOPY WITH PROPOFOL;  Surgeon: Lollie Sails, MD;  Location: [facility] ENDOSCOPY;  Service: Endoscopy;  Laterality: N/A;  . ESOPHAGOGASTRODUODENOSCOPY (EGD) WITH PROPOFOL N/A 12/13/2016   Procedure: ESOPHAGOGASTRODUODENOSCOPY (EGD) WITH PROPOFOL;  Surgeon: Lollie Sails, MD;  Location: [facility] ENDOSCOPY;  Service: Endoscopy;  Laterality: N/A;  . left arm surgery     Patient Active Problem List   Diagnosis Date Noted  . Pleuritic chest pain 05/20/2019  . CAP (community acquired pneumonia) 05/20/2019  . Hypertension   . COPD exacerbation ([place])   . Acute on chronic respiratory failure with hypoxia ([place])   . Altered mental status 09/25/2018  . Schizophrenia ([place])   . Tachycardia 08/21/2018  . Schizophrenia, chronic with acute exacerbation ([place]) 08/17/2018  . Elevated PSA 05/24/2015      Prior to Admission medications   Medication Sig Start Date End Date Taking? Authorizing  Provider  albuterol (PROVENTIL) (2.5 MG/3ML) 0.083% nebulizer solution Take 2.5 mg by nebulization every 6 (six) hours as needed for wheezing or shortness of breath.   Yes [provider]  albuterol (VENTOLIN HFA) 108 (90 Base) MCG/ACT inhaler Inhale 1 puff into the lungs every 6 (six) hours as needed for wheezing or shortness of breath.   Yes [provider]  amLODipine-benazepril (LOTREL) 5-20 MG capsule Take 1 capsule by mouth daily.   Yes [provider]  ARIPiprazole ER (ABILIFY MAINTENA) 400 MG PRSY prefilled syringe Inject 400 mg into the muscle every 30 (thirty) days.   Yes [provider]  dextromethorphan-guaiFENesin (MUCINEX DM) 30-600 MG 12hr tablet Take 1 tablet by mouth 2 (two) times daily. 05/22/19  Yes Lorella Nimrod, MD  divalproex (DEPAKOTE ER) 250 MG 24 hr tablet Take 500 mg by mouth at bedtime.   Yes [provider]  donepezil (ARICEPT) 5 MG tablet Take 5 mg by mouth at bedtime.   Yes [provider]  haloperidol (HALDOL) 5 MG tablet Take 2.5 mg by mouth daily.   Yes [provider]  hydrOXYzine (ATARAX/VISTARIL) 50 MG tablet Take 50 mg by mouth every 6 (six) hours as needed for anxiety or itching.   Yes [provider]  Ipratropium-Albuterol (COMBIVENT RESPIMAT) 20-100 MCG/ACT AERS respimat Inhale 1 puff into the lungs every 6 (six) hours as needed for wheezing or shortness of breath.    Yes [provider]  levofloxacin (LEVAQUIN) 750 MG tablet Take  1 tablet (750 mg total) by mouth daily for 7 days. 05/22/19 05/29/19 Yes Lorella Nimrod, MD  LORazepam (ATIVAN) 1 MG tablet Take 1 tablet (1 mg total) by mouth every 6 (six) hours as needed for anxiety. 08/22/18  Yes Gouru, Illene Silver, MD  predniSONE (DELTASONE) 20 MG tablet Take 2 tablets (40 mg total) by mouth daily with breakfast for 3 days. 05/22/19 05/25/19 Yes Lorella Nimrod, MD  QUEtiapine (SEROQUEL) 400 MG tablet Take 800 mg by mouth at bedtime.   Yes [provider]  traZODone (DESYREL) 150 MG tablet Take 300 mg by mouth at bedtime.    Yes [provider]  trihexyphenidyl (ARTANE) 5 MG tablet Take 1 tablet (5 mg total) by mouth 3 (three) times daily with meals. 05/22/19  Yes Lorella Nimrod, MD  vitamin B-12 (CYANOCOBALAMIN) 1000 MCG tablet Take 1,000 mcg by mouth daily.   Yes [provider]    Allergies Ace inhibitors    Social History Social History   Tobacco Use  . Smoking status: Former Smoker    Packs/day: 1.00    Types: Cigarettes  . Smokeless tobacco: Never Used  Substance Use Topics  . Alcohol use: Yes    Alcohol/week: 0.0 standard drinks    Comment: beer  . Drug use: Yes    Types: Marijuana    Comment: marjuana    Review of Systems Patient denies headaches, rhinorrhea, blurry vision, numbness, shortness of breath, chest pain, edema, cough, abdominal pain, nausea, vomiting, diarrhea, dysuria, fevers, rashes or hallucinations unless otherwise stated above in HPI. ____________________________________________   PHYSICAL EXAM:  VITAL SIGNS: Vitals:   05/23/19 2230 05/23/19 2245  BP: 124/87 125/83  Pulse: 98 99  Resp: (!) 26 (!) 27  Temp:  98.2 F (36.8 C)  SpO2: 94% 91%    Constitutional: Alert, chronically ill appearing Eyes: Conjunctivae are normal.  Head: Atraumatic. Nose: No congestion/rhinnorhea. Mouth/Throat: Mucous membranes are moist.   Neck: No stridor. Painless ROM.  Cardiovascular: Normal rate, regular rhythm. Grossly normal heart sounds.  Good peripheral circulation. Respiratory: tachypnea with use of accessory muscles, prolonged expiratory phase, scattered wheeze Gastrointestinal: Soft and nontender. No distention. No abdominal bruits. No CVA tenderness. Genitourinary:  Musculoskeletal: No lower extremity tenderness nor edema.  No joint effusions. Neurologic:  Normal speech and language. No gross focal neurologic deficits are appreciated. No facial droop Skin:  Skin is warm,  dry and intact. No rash noted. Psychiatric: Mood and affect are normal. Speech and behavior are normal.  ____________________________________________   LABS (all labs ordered are listed, but only abnormal results are displayed)  Results for orders placed or performed during the hospital encounter of 05/23/19 (from the past 24 hour(s))  CBC with Differential/Platelet     Status: None   Collection Time: 05/23/19  9:35 PM  Result Value Ref Range   WBC 7.7 4.0 - 10.5 K/uL   RBC 5.22 4.22 - 5.81 MIL/uL   Hemoglobin 15.5 13.0 - 17.0 g/dL   HCT 46.3 39.0 - 52.0 %   MCV 88.7 80.0 - 100.0 fL   MCH 29.7 26.0 - 34.0 pg   MCHC 33.5 30.0 - 36.0 g/dL   RDW 13.7 11.5 - 15.5 %   Platelets 214 150 - 400 K/uL   nRBC 0.0 0.0 - 0.2 %   Neutrophils Relative % 65 %   Neutro Abs 5.0 1.7 - 7.7 K/uL   Lymphocytes Relative 26 %   Lymphs Abs 2.0 0.7 - 4.0 K/uL   Monocytes Relative 8 %  Monocytes Absolute 0.6 0.1 - 1.0 K/uL   Eosinophils Relative 1 %   Eosinophils Absolute 0.1 0.0 - 0.5 K/uL   Basophils Relative 0 %   Basophils Absolute 0.0 0.0 - 0.1 K/uL   Immature Granulocytes 0 %   Abs Immature Granulocytes 0.02 0.00 - 0.07 K/uL  Comprehensive metabolic panel     Status: Abnormal   Collection Time: 05/23/19  9:35 PM  Result Value Ref Range   Sodium 139 135 - 145 mmol/L   Potassium 4.0 3.5 - 5.1 mmol/L   Chloride 104 98 - 111 mmol/L   CO2 30 22 - 32 mmol/L   Glucose, Bld 127 (H) 70 - 99 mg/dL   BUN 17 8 - 23 mg/dL   Creatinine, Ser 1.02 0.61 - 1.24 mg/dL   Calcium 9.4 8.9 - 10.3 mg/dL   Total Protein 6.5 6.5 - 8.1 g/dL   Albumin 3.7 3.5 - 5.0 g/dL   AST 35 15 - 41 U/L   ALT 50 (H) 0 - 44 U/L   Alkaline Phosphatase 72 38 - 126 U/L   Total Bilirubin 0.6 0.3 - 1.2 mg/dL   GFR calc non Af Amer >60 >60 mL/min   GFR calc Af Amer >60 >60 mL/min   Anion gap 5 5 - 15  Troponin I (High Sensitivity)     Status: None   Collection Time: 05/23/19  9:35 PM  Result Value Ref Range   Troponin I (High  Sensitivity) 5 <18 ng/L   ____________________________________________  EKG My review and personal interpretation at Time: 21:33   Indication: chest pain  Rate: 115  Rhythm: sinus Axis: normal Other: normal intervals,  Pr depressions, nonspecific st abn, abnml ekg ____________________________________________  RADIOLOGY  I personally reviewed all radiographic images ordered to evaluate for the above acute complaints and reviewed radiology reports and findings.  These findings were personally discussed with the patient.  Please see medical record for radiology report.  ____________________________________________   PROCEDURES  Procedure(s) performed:  Procedures    Critical Care performed: no ____________________________________________   INITIAL IMPRESSION / ASSESSMENT AND PLAN / ED COURSE  Pertinent labs & imaging results that were available during my care of the patient were reviewed by me and considered in my medical decision making (see chart for details).   DDX: Asthma, copd, CHF, pna, ptx, malignancy, Pe, anemia   Carlos Nelson is a 69 y.o. who presents to the ED with symptoms as described above.  Patient has complaints of chest discomfort with some shortness of breath.  Will give nebs.  Seems primary reason he return to the ER was recurrent symptoms from his previous admission but was unable to have the medications that were prescribed.  Unable to reach people at the group home.  Will give nebs.  EKG with some nonspecific changes.  Initial troponin is negative.  Is having some improvement with nebs.  Will order prednisone.  Will discuss with hospitalist for admission.     The patient was evaluated in Emergency Department today for the symptoms described in the history of present illness. He/she was evaluated in the context of the global COVID-19 pandemic, which necessitated consideration that the patient might be at risk for infection with the SARS-CoV-2 virus that  causes COVID-19. Institutional protocols and algorithms that pertain to the evaluation of patients at risk for COVID-19 are in a state of rapid change based on information released by regulatory bodies including the CDC and federal and state organizations. These policies and  algorithms were followed during the patient's care in the ED.  As part of my medical decision making, I reviewed the following data within the [place] notes reviewed and incorporated, Labs reviewed, notes from prior ED visits and [place] Controlled Substance Database   ____________________________________________   FINAL CLINICAL IMPRESSION(S) / ED DIAGNOSES  Final diagnoses:  COPD exacerbation ([place])  Chest pain, unspecified type      NEW MEDICATIONS STARTED DURING THIS VISIT:  New Prescriptions   No medications on file     Note:  This document was prepared using Dragon voice recognition software and may include unintentional dictation errors.    Merlyn Lot, MD 05/23/19 [phone]

## 2019-05-23 NOTE — ED Notes (Signed)
Portable XR at bedside

## 2019-05-23 NOTE — ED Notes (Signed)
Pt O2 saturation dropped to 90-89% at this time, pt placed on 2L via 

## 2019-05-23 NOTE — ED Triage Notes (Signed)
  Admitted for couple days discharge on Friday, stays in care facility. Unable to get medication d/t weekend. Sent home with inhalers. C/o of Chest pain, and SOB, and hypertensive.

## 2019-05-24 ENCOUNTER — Encounter: Payer: Self-pay | Admitting: Internal Medicine

## 2019-05-24 ENCOUNTER — Other Ambulatory Visit: Payer: Self-pay

## 2019-05-24 DIAGNOSIS — E538 Deficiency of other specified B group vitamins: Secondary | ICD-10-CM | POA: Diagnosis present

## 2019-05-24 DIAGNOSIS — Z888 Allergy status to other drugs, medicaments and biological substances status: Secondary | ICD-10-CM | POA: Diagnosis not present

## 2019-05-24 DIAGNOSIS — Z20822 Contact with and (suspected) exposure to covid-19: Secondary | ICD-10-CM | POA: Diagnosis present

## 2019-05-24 DIAGNOSIS — Z87891 Personal history of nicotine dependence: Secondary | ICD-10-CM | POA: Diagnosis not present

## 2019-05-24 DIAGNOSIS — J441 Chronic obstructive pulmonary disease with (acute) exacerbation: Secondary | ICD-10-CM | POA: Diagnosis present

## 2019-05-24 DIAGNOSIS — R413 Other amnesia: Secondary | ICD-10-CM | POA: Diagnosis present

## 2019-05-24 DIAGNOSIS — J439 Emphysema, unspecified: Secondary | ICD-10-CM | POA: Diagnosis present

## 2019-05-24 DIAGNOSIS — I10 Essential (primary) hypertension: Secondary | ICD-10-CM | POA: Diagnosis present

## 2019-05-24 DIAGNOSIS — J189 Pneumonia, unspecified organism: Secondary | ICD-10-CM | POA: Diagnosis present

## 2019-05-24 DIAGNOSIS — Z79899 Other long term (current) drug therapy: Secondary | ICD-10-CM | POA: Diagnosis not present

## 2019-05-24 DIAGNOSIS — Z79890 Hormone replacement therapy (postmenopausal): Secondary | ICD-10-CM | POA: Diagnosis not present

## 2019-05-24 DIAGNOSIS — J9621 Acute and chronic respiratory failure with hypoxia: Secondary | ICD-10-CM | POA: Diagnosis present

## 2019-05-24 DIAGNOSIS — Z9981 Dependence on supplemental oxygen: Secondary | ICD-10-CM | POA: Diagnosis not present

## 2019-05-24 DIAGNOSIS — Z7952 Long term (current) use of systemic steroids: Secondary | ICD-10-CM | POA: Diagnosis not present

## 2019-05-24 DIAGNOSIS — F209 Schizophrenia, unspecified: Secondary | ICD-10-CM | POA: Diagnosis present

## 2019-05-24 LAB — BASIC METABOLIC PANEL
Anion gap: 8 (ref 5–15)
BUN: 16 mg/dL (ref 8–23)
CO2: 29 mmol/L (ref 22–32)
Calcium: 9.1 mg/dL (ref 8.9–10.3)
Chloride: 103 mmol/L (ref 98–111)
Creatinine, Ser: 0.98 mg/dL (ref 0.61–1.24)
GFR calc Af Amer: >60 mL/min (ref >60)
GFR calc non Af Amer: >60 mL/min (ref >60)
Glucose, Bld: 98 mg/dL (ref 70–99)
Potassium: 3.9 mmol/L (ref 3.5–5.1)
Sodium: 140 mmol/L (ref 135–145)

## 2019-05-24 LAB — MRSA PCR SCREENING: MRSA by PCR: NEGATIVE

## 2019-05-24 LAB — CBC WITH DIFFERENTIAL/PLATELET
Abs Immature Granulocytes: 0.03 10*3/uL (ref 0.00–0.07)
Basophils Absolute: 0 10*3/uL (ref 0.0–0.1)
Basophils Relative: 0 %
Eosinophils Absolute: 0 10*3/uL (ref 0.0–0.5)
Eosinophils Relative: 1 %
HCT: 44 % (ref 39.0–52.0)
Hemoglobin: 14.3 g/dL (ref 13.0–17.0)
Immature Granulocytes: 0 %
Lymphocytes Relative: 16 %
Lymphs Abs: 1.2 10*3/uL (ref 0.7–4.0)
MCH: 29.7 pg (ref 26.0–34.0)
MCHC: 32.5 g/dL (ref 30.0–36.0)
MCV: 91.5 fL (ref 80.0–100.0)
Monocytes Absolute: 0.3 10*3/uL (ref 0.1–1.0)
Monocytes Relative: 4 %
Neutro Abs: 6 10*3/uL (ref 1.7–7.7)
Neutrophils Relative %: 79 %
Platelets: 217 10*3/uL (ref 150–400)
RBC: 4.81 MIL/uL (ref 4.22–5.81)
RDW: 13.7 % (ref 11.5–15.5)
WBC: 7.6 10*3/uL (ref 4.0–10.5)
nRBC: 0 % (ref 0.0–0.2)

## 2019-05-24 LAB — RESPIRATORY PANEL BY RT PCR (FLU A&B, COVID)
Influenza A by PCR: NEGATIVE
Influenza B by PCR: NEGATIVE
SARS Coronavirus 2 by RT PCR: NEGATIVE

## 2019-05-24 LAB — TROPONIN I (HIGH SENSITIVITY): Troponin I (High Sensitivity): 5 ng/L (ref <18)

## 2019-05-24 LAB — HIV ANTIBODY (ROUTINE TESTING W REFLEX): HIV Screen 4th Generation wRfx: NONREACTIVE

## 2019-05-24 MED ORDER — DIVALPROEX SODIUM ER 500 MG PO TB24
500.0000 mg | ORAL_TABLET | Freq: Every day | ORAL | Status: DC
  Administered 2019-05-24: 500 mg via ORAL
  Filled 2019-05-24 (×2): qty 1

## 2019-05-24 MED ORDER — PREDNISONE 20 MG PO TABS
40.0000 mg | ORAL_TABLET | Freq: Every day | ORAL | Status: DC
  Administered 2019-05-24 – 2019-05-25 (×2): 40 mg via ORAL
  Filled 2019-05-24 (×2): qty 2

## 2019-05-24 MED ORDER — DM-GUAIFENESIN ER 30-600 MG PO TB12: 1.0000 | ORAL_TABLET | Freq: Two times a day (BID) | ORAL | Status: DC

## 2019-05-24 MED ORDER — IPRATROPIUM-ALBUTEROL 0.5-2.5 (3) MG/3ML IN SOLN
3.0000 mL | Freq: Four times a day (QID) | RESPIRATORY_TRACT | Status: DC
  Administered 2019-05-24: 3 mL via RESPIRATORY_TRACT
  Filled 2019-05-24 (×2): qty 3

## 2019-05-24 MED ORDER — BENAZEPRIL HCL 20 MG PO TABS
20.0000 mg | ORAL_TABLET | Freq: Every day | ORAL | Status: DC
  Administered 2019-05-24 – 2019-05-25 (×2): 20 mg via ORAL
  Filled 2019-05-24 (×2): qty 1

## 2019-05-24 MED ORDER — ALBUTEROL SULFATE (2.5 MG/3ML) 0.083% IN NEBU: 3.0000 mL | INHALATION_SOLUTION | RESPIRATORY_TRACT | Status: DC | PRN

## 2019-05-24 MED ORDER — LEVOFLOXACIN 500 MG PO TABS
750.0000 mg | ORAL_TABLET | ORAL | Status: DC
  Administered 2019-05-24 (×2): 750 mg via ORAL
  Filled 2019-05-24: qty 2
  Filled 2019-05-24: qty 1

## 2019-05-24 MED ORDER — VITAMIN B-12 1000 MCG PO TABS
1000.0000 ug | ORAL_TABLET | Freq: Every day | ORAL | Status: DC
  Administered 2019-05-24 – 2019-05-25 (×2): 1000 ug via ORAL
  Filled 2019-05-24 (×3): qty 1

## 2019-05-24 MED ORDER — AMLODIPINE BESY-BENAZEPRIL HCL 5-20 MG PO CAPS: 1.0000 | ORAL_CAPSULE | Freq: Every day | ORAL | Status: DC

## 2019-05-24 MED ORDER — TRAZODONE HCL 100 MG PO TABS: 300.0000 mg | ORAL_TABLET | Freq: Every day | ORAL | Status: DC

## 2019-05-24 MED ORDER — HALOPERIDOL 2 MG PO TABS
2.5000 mg | ORAL_TABLET | Freq: Every day | ORAL | Status: DC
  Administered 2019-05-24 – 2019-05-25 (×2): 2.5 mg via ORAL
  Filled 2019-05-24 (×2): qty 1

## 2019-05-24 MED ORDER — DIVALPROEX SODIUM ER 500 MG PO TB24: 500.0000 mg | ORAL_TABLET | Freq: Every day | ORAL | Status: DC

## 2019-05-24 MED ORDER — METOPROLOL TARTRATE 5 MG/5ML IV SOLN
2.5000 mg | Freq: Once | INTRAVENOUS | Status: AC
  Administered 2019-05-24: 2.5 mg via INTRAVENOUS
  Filled 2019-05-24: qty 5

## 2019-05-24 MED ORDER — AMLODIPINE BESYLATE 5 MG PO TABS
5.0000 mg | ORAL_TABLET | Freq: Every day | ORAL | Status: DC
  Administered 2019-05-24 – 2019-05-25 (×2): 5 mg via ORAL
  Filled 2019-05-24 (×2): qty 1

## 2019-05-24 MED ORDER — DONEPEZIL HCL 5 MG PO TABS: 5.0000 mg | ORAL_TABLET | Freq: Every day | ORAL | Status: DC

## 2019-05-24 MED ORDER — ALBUTEROL SULFATE (2.5 MG/3ML) 0.083% IN NEBU
2.5000 mg | INHALATION_SOLUTION | RESPIRATORY_TRACT | Status: DC | PRN
  Administered 2019-05-24: 2.5 mg via RESPIRATORY_TRACT
  Filled 2019-05-24: qty 3

## 2019-05-24 MED ORDER — ENOXAPARIN SODIUM 40 MG/0.4ML ~~LOC~~ SOLN
40.0000 mg | SUBCUTANEOUS | Status: DC
  Administered 2019-05-24 (×2): 40 mg via SUBCUTANEOUS
  Filled 2019-05-24 (×2): qty 0.4

## 2019-05-24 MED ORDER — TRIHEXYPHENIDYL HCL 5 MG PO TABS
5.0000 mg | ORAL_TABLET | Freq: Three times a day (TID) | ORAL | Status: DC
  Administered 2019-05-24 – 2019-05-25 (×5): 5 mg via ORAL
  Filled 2019-05-24 (×6): qty 1

## 2019-05-24 MED ORDER — QUETIAPINE FUMARATE 300 MG PO TABS: 800.0000 mg | ORAL_TABLET | Freq: Every day | ORAL | Status: DC

## 2019-05-24 MED ORDER — LORAZEPAM 1 MG PO TABS: 1.0000 mg | ORAL_TABLET | Freq: Four times a day (QID) | ORAL | Status: DC | PRN

## 2019-05-24 MED ORDER — DONEPEZIL HCL 5 MG PO TABS
5.0000 mg | ORAL_TABLET | Freq: Every day | ORAL | Status: DC
  Administered 2019-05-24: 5 mg via ORAL
  Filled 2019-05-24: qty 1

## 2019-05-24 MED ORDER — DM-GUAIFENESIN ER 30-600 MG PO TB12
1.0000 | ORAL_TABLET | Freq: Two times a day (BID) | ORAL | Status: DC
  Administered 2019-05-24 – 2019-05-25 (×3): 1 via ORAL
  Filled 2019-05-24 (×3): qty 1

## 2019-05-24 MED ORDER — IPRATROPIUM-ALBUTEROL 0.5-2.5 (3) MG/3ML IN SOLN: 3.0000 mL | Freq: Four times a day (QID) | RESPIRATORY_TRACT | Status: DC | PRN

## 2019-05-24 MED ORDER — ARFORMOTEROL TARTRATE 15 MCG/2ML IN NEBU
15.0000 ug | INHALATION_SOLUTION | Freq: Two times a day (BID) | RESPIRATORY_TRACT | Status: DC
  Administered 2019-05-24 – 2019-05-25 (×4): 15 ug via RESPIRATORY_TRACT
  Filled 2019-05-24 (×5): qty 2

## 2019-05-24 MED ORDER — BUDESONIDE 0.25 MG/2ML IN SUSP
0.5000 mg | Freq: Two times a day (BID) | RESPIRATORY_TRACT | Status: DC
  Administered 2019-05-24 – 2019-05-25 (×4): 0.5 mg via RESPIRATORY_TRACT
  Filled 2019-05-24 (×4): qty 4

## 2019-05-24 MED ORDER — QUETIAPINE FUMARATE 300 MG PO TABS
800.0000 mg | ORAL_TABLET | Freq: Every day | ORAL | Status: DC
  Administered 2019-05-24: 800 mg via ORAL
  Filled 2019-05-24 (×2): qty 1

## 2019-05-24 MED ORDER — TRAZODONE HCL 100 MG PO TABS
300.0000 mg | ORAL_TABLET | Freq: Every day | ORAL | Status: DC
  Administered 2019-05-24: 300 mg via ORAL
  Filled 2019-05-24: qty 3

## 2019-05-24 NOTE — Progress Notes (Signed)
MRSA completed and sent to lab

## 2019-05-24 NOTE — Progress Notes (Signed)
Patient transferred from ED to room 136 via stretcher. Patient alert and oriented. Oriented patient to room and room equipment. Patient is steady on his feet independently but explained importance of using the call bell to call for assistance during the first night of hospital stay due to having some shortness of breath which is part of reason for being admitted. Explained fall /safety policy to and that bed alarm will be in place as well for the first  night of staty. Patient understood. Currently patient sitting up in bed eating small snack given by staff. Patient complains of no discomfort at this time.

## 2019-05-24 NOTE — Progress Notes (Signed)
[place] [facility] [place] NAME: Carlos Nelson    MR#:  [id number]  DATE OF BIRTH:  May 30, 1950  SUBJECTIVE:  patient complains of weakness in both his legs. His breathing is little better however he still feeling short winded on exertion. Currently on 2 L nasal cannula oxygen. Sats 94%. he was just discharge on May 7 return back since he was not able to access to his medications.  REVIEW OF SYSTEMS:   Review of Systems  Constitutional: Positive for malaise/fatigue. Negative for chills, fever and weight loss.  HENT: Negative for ear discharge, ear pain and nosebleeds.   Eyes: Negative for blurred vision, pain and discharge.  Respiratory: Negative for sputum production, shortness of breath, wheezing and stridor.   Cardiovascular: Negative for chest pain, palpitations, orthopnea and PND.  Gastrointestinal: Negative for abdominal pain, diarrhea, nausea and vomiting.  Genitourinary: Negative for frequency and urgency.  Musculoskeletal: Positive for joint pain. Negative for back pain.  Neurological: Positive for weakness. Negative for sensory change, speech change and focal weakness.  Psychiatric/Behavioral: Negative for depression and hallucinations.   Tolerating Diet:yes Tolerating PT: pending  DRUG ALLERGIES:   Allergies  Allergen Reactions  . Ace Inhibitors Swelling    Ok with benazapril, per grouphome    VITALS:  Blood pressure 130/88, pulse 88, temperature 98 F (36.7 C), temperature source Oral, resp. rate 17, height 5\' 4"  (1.626 m), weight 57.8 kg, SpO2 94 %.  PHYSICAL EXAMINATION:   Physical Exam  GENERAL:  69 y.o.-year-old patient lying in the bed with no acute distress.  EYES: Pupils equal, round, reactive to light and accommodation. No scleral icterus.   HEENT: Head atraumatic, normocephalic. Oropharynx and nasopharynx clear.  NECK:  Supple, no jugular venous distention. No thyroid enlargement, no tenderness.  LUNGS:Distant  breath sounds bilaterally, no wheezing, rales, rhonchi. No use of accessory muscles of respiration. Emphysematous chest + CARDIOVASCULAR: S1, S2 normal. No murmurs, rubs, or gallops.  ABDOMEN: Soft, nontender, nondistended. Bowel sounds present. No organomegaly or mass.  EXTREMITIES: No cyanosis, clubbing or edema b/l.    NEUROLOGIC: Cranial nerves II through XII are intact. No focal Motor or sensory deficits b/l.   PSYCHIATRIC:  patient is alert and oriented x 2  SKIN: No obvious rash, lesion, or ulcer.   LABORATORY PANEL:  CBC Recent Labs  Lab 05/24/19 0333  WBC 7.6  HGB 14.3  HCT 44.0  PLT 217    Chemistries  Recent Labs  Lab 05/23/19 2135 05/23/19 2135 05/24/19 0333  NA 139   < > 140  K 4.0   < > 3.9  CL 104   < > 103  CO2 30   < > 29  GLUCOSE 127*   < > 98  BUN 17   < > 16  CREATININE 1.02   < > 0.98  CALCIUM 9.4   < > 9.1  AST 35  --   --   ALT 50*  --   --   ALKPHOS 72  --   --   BILITOT 0.6  --   --    < > = values in this interval not displayed.   Cardiac Enzymes No results for input(s): TROPONINI in the last 168 hours. RADIOLOGY:  DG Chest Portable 1 View  Result Date: 05/23/2019 CLINICAL DATA:  Chest pain. EXAM: PORTABLE CHEST 1 VIEW COMPARISON:  May 20, 2019 FINDINGS: Emphysematous changes are again noted. The lungs are hyperexpanded. Again noted is a subtle  opacity overlying the left lower lung zone which has slightly improved from the prior study. There is no pneumothorax. No large pleural effusion. The heart size is stable. IMPRESSION: 1. Improving left lower lung zone airspace opacity. 2. Emphysema. 3. No new focal infiltrate. Electronically Signed   By: Constance Holster M.D.   On: 05/23/2019 22:26   ASSESSMENT AND PLAN:   [name] a 69 y.o.malewith medical history significant ofhypertension, COPD, schizophrenia, who presents with shortness breath. Patient was just discharge on May 7. Apparently he/group home was not able to get his  medications. Came back with increasing shortness of breath.  Acute on chronic respiratory failure with hypoxiadue toCOPD exacerbationand possible CAP: -Chest x-ray from may 5th showed patchy infiltrationin the left basilar area,indicating possible pneumonia.  Strepneumo antigen negative. -Blood Culture remain negative. -Wheezing improved today although feels winded with exertion with weakness. -resumed on prednisone 40 mg daily for 5 days. -Continue levaquin -Supplemental oxygen as needed. -Continue DuoNeb/pulmicort -patient requires 2 L oxygen at present. Wean to room air if able to  Hypertension.  -Continue home dose of Lotrel.  Schizophrenia ([place]): pt is calm andcooperative. -Continue home medications:Haldol, hydroxyzine, Ativan, Seroquel,artane,Depakote  Tobacco abuse -consult tobacco abuse cessation.  Procedures:none Family communication :none Consults :none CODE STATUS: FULL DVT Prophylaxis :lovenox  Status is: Inpatient  Dispo: The patient is from: Group home              Anticipated d/c is to: Group home              Anticipated d/c date is: 1 day              Patient currently is not medically stable to d/c. patient feeling short of breath on exertion. Needs physical therapy for generalized weakness. Will wean him off oxygen is able to. PT consult pending       TOTAL TIME TAKING CARE OF THIS PATIENT: *30* minutes.  >50% time spent on counselling and coordination of care  Note: This dictation was prepared with Dragon dictation along with smaller phrase technology. Any transcriptional errors that result from this process are unintentional.  Fritzi Mandes M.D    [facility]   CC: Primary care physician; Jodi Marble, MDPatient ID: Carlos Nelson, male   DOB: 1950-10-18, 69 y.o.   MRN: [id number]

## 2019-05-24 NOTE — Progress Notes (Signed)
Patient notifies Nurse after assessing for pain that he has a some tenderness in the genital area. Patient states that he is not able to thoroughly clean penis whenever he gets in the shower and states that there is "mildew" there. Assessed penis. Patient is uncircumcised. Asked patient to pull foreskin back and there was moist to dry, thick, white particles in small to large clumps adhered to the skin, under the head of penis and where the shaft begins. Gently cleansed area for patient states it is very tender and can only describe that it hurts a lot.  Patient states it has been like this for about 8 months. After cleansing around the corona of the penis, educated patient to explain the importance of pulling the foreskin back and cleaning as demonstrated to him to prevent any further infection. Explained to patient to notify the doctor to have it evaluated to ensure there is nothing else going on causing the thick white particles to form around the corona of the head of the penis. Patient understood.   Also assisted patient with using the urinal and patient appeared to have increased difficulty when voiding where at one point Nurse asked if he was in pain but patient states no. Patient states he has always had difficulty or that it has always been like that as long as he can remember. Patient says he has never had his prostate evaluated.  Urine has strong odor is concentrated and clear. Unsure if this could be part of the problem but is important for physician to evaluate all of the above or refer if needed.  Will inform oncoming nurse of this issue to notify rounding Physician of this matter.

## 2019-05-24 NOTE — Evaluation (Signed)
Physical Therapy Evaluation Patient Details Name: Carlos Nelson MRN: [id number] DOB: 03/15/1950 Today's Date: 05/24/2019   History of Present Illness  presented to ER secondary to CP, SOB, elevated BP (unable to fill meds over weekend); admitted for management of chronic respiratory failure due to COPD. Of note, recent admission (discahrged 05/22/19) due to COPD/L PNA.  Clinical Impression  Upon evaluation, patient alert and oriented to basic information; follows simple commands, pleasant and cooperative throughout session.  Bilat UE/LE strength and ROM grossly symmetrical and WFL; no focal weakness appreciated.  Able to complete bed mobility indep; sit/stand, basic transfers and gait (400') without assist device, close sup/mod indep.  Demonstrates reciprocal stepping pattern with decreased trunk rotation and arm swing; mild decreased in R LE weight shift/WBing. Completes dynamic gait components without buckling, LOB or safety concern. Of note, patient on RA upon arrival to session; sats 92-93% at rest, 89-90% with exertion (quickly rebounding to 96%) on RA.  Left on RA end of session. Appears to be at baseline level of functional ability; no acute PT needs identified at this time.  Will complete initial order, but do encourage continued mobilization with nursing staff throughout remaining hospitalization to prevent deconditioning.  Please re-consult should needs change.    Follow Up Recommendations No PT follow up    Equipment Recommendations       Recommendations for Other Services       Precautions / Restrictions Precautions Precautions: Fall Restrictions Weight Bearing Restrictions: No      Mobility  Bed Mobility Overal bed mobility: Independent                Transfers Overall transfer level: Modified independent Equipment used: None             General transfer comment: sit/stand from various seating surfaces (edge of bed, recliner, standard toilet), all with mod  indep  Ambulation/Gait Ambulation/Gait assistance: Supervision;Modified independent (Device/Increase time) Gait Distance (Feet): 400 Feet Assistive device: None   Gait velocity: 10' walk time, 8-9 seconds   General Gait Details: reciprocal stepping pattern with decreased trunk rotation and arm swing; mild decreased in R LE weight shift/WBing. Completes dynamic gait components without buckling, LOB or safety concern.  Stairs            Wheelchair Mobility    Modified Rankin (Stroke Patients Only)       Balance Overall balance assessment: Modified Independent                                           Pertinent Vitals/Pain Pain Assessment: No/denies pain    Home Living Family/patient expects to be discharged to:: Group home Living Arrangements: Group Home               Additional Comments: Reports group home is single-story with 3 steps, R rail to enter    Prior Function Level of Independence: Independent         Comments: Indep with ADLs, household and community mobilization without assist device; denies fall history.  Responsible for 'taking out the trash' in group home environment     Hand Dominance        Extremity/Trunk Assessment   Upper Extremity Assessment Upper Extremity Assessment: Overall WFL for tasks assessed    Lower Extremity Assessment Lower Extremity Assessment: Overall WFL for tasks assessed       Communication  Communication: No difficulties  Cognition Arousal/Alertness: Awake/alert Behavior During Therapy: WFL for tasks assessed/performed Overall Cognitive Status: Within Functional Limits for tasks assessed                                        General Comments      Exercises     Assessment/Plan    PT Assessment Patent does not need any further PT services  PT Problem List Decreased activity tolerance;Decreased mobility;Cardiopulmonary status limiting activity       PT  Treatment Interventions Gait training;Therapeutic activities;Patient/family education    PT Goals (Current goals can be found in the Care Plan section)  Acute Rehab PT Goals Patient Stated Goal: to get back home PT Goal Formulation: All assessment and education complete, DC therapy Time For Goal Achievement: 05/24/19 Potential to Achieve Goals: Good    Frequency     Barriers to discharge        Co-evaluation               AM-PAC PT "6 Clicks" Mobility  Outcome Measure Help needed turning from your back to your side while in a flat bed without using bedrails?: None Help needed moving from lying on your back to sitting on the side of a flat bed without using bedrails?: None Help needed moving to and from a bed to a chair (including a wheelchair)?: None Help needed standing up from a chair using your arms (e.g., wheelchair or bedside chair)?: None Help needed to walk in hospital room?: None Help needed climbing 3-5 steps with a railing? : A Little 6 Click Score: 23    End of Session Equipment Utilized During Treatment: Gait belt Activity Tolerance: Patient tolerated treatment well Patient left: in chair;with call bell/phone within reach;with chair alarm set Nurse Communication: Mobility status PT Visit Diagnosis: Muscle weakness (generalized) (M62.81)    Time: [id number] PT Time Calculation (min) (ACUTE ONLY): 18 min   Charges:   PT Evaluation $PT Eval Moderate Complexity: 1 Mod         Vitoria Conyer H. Owens Shark, PT, DPT, NCS 05/24/19, 2:49 PM [phone]

## 2019-05-24 NOTE — H&P (Signed)
History and Physical    Carlos Nelson [id number] DOB: 1950-02-11 DOA: 05/23/2019  PCP: Jodi Marble, MD   Patient coming from: Group home  I have personally briefly reviewed patient's old medical records in [place]  Chief Complaint: Shortness of breath, atypical chest pain  HPI: Carlos Nelson is a 69 y.o. male with medical history significant of hypertension, COPD, schizophrenia, chronic hypoxic respiratory failure on 2 L of oxygen.  Patient is a resident of a group home.  Patient was recently admitted to our facility with COPD exacerbation and left-sided pneumonia and discharged back to the group home on 05/22/2019.  Patient was supposed to be on prednisone taper and Levaquin.  Patient was not able to get any medications due to the weekend.  Patient sent back to the ER with complaints of chest pain, shortness of breath and blood pressure being high.  Pain saturations in the ER were 95% on 2 L.  Patient was not able to get his medications filled over the weekend.  Patient is a very poor historian not able provide much history.  He said that he feels short of breath.  Does have some coughing but not able to bring up any sputum.  When asked about any chest discomfort he said that it hurts when he coughs.  Denies any abdominal pain, nausea or any vomitings.  No fevers or chills.  Denies any diarrhea or any constipation.  ED Course: Patient was treated with prednisone, duo nebs in the ER.  Review of Systems: Ten point review of systems reviewed  in detail and negative except as mentioned above in the HPI.   Past Medical History:  Diagnosis Date  . COPD (chronic obstructive pulmonary disease) ([place])   . Hypertension   . Schizophrenia [facility])     Past Surgical History:  Procedure Laterality Date  . COLONOSCOPY    . COLONOSCOPY WITH PROPOFOL N/A 12/13/2016   Procedure: COLONOSCOPY WITH PROPOFOL;  Surgeon: Lollie Sails, MD;  Location[facility] ENDOSCOPY;  Service:  Endoscopy;  Laterality: N/A;  . ESOPHAGOGASTRODUODENOSCOPY (EGD) WITH PROPOFOL N/A 12/13/2016   Procedure: ESOPHAGOGASTRODUODENOSCOPY (EGD) WITH PROPOFOL;  Surgeon: Lollie Sails, MD;  Location: [facility] ENDOSCOPY;  Service: Endoscopy;  Laterality: N/A;  . left arm surgery      Social History  reports that he has quit smoking. His smoking use included cigarettes. He smoked 1.00 pack per day. He has never used smokeless tobacco. He reports current alcohol use. He reports current drug use. Drug: Marijuana.  Allergies  Allergen Reactions  . Ace Inhibitors Swelling    Ok with benazapril, per grouphome    Family History  Problem Relation Age of Onset  . Prostate cancer Neg Hx   . Bladder Cancer Neg Hx       Prior to Admission medications   Medication Sig Start Date End Date Taking? Authorizing Provider  albuterol (PROVENTIL) (2.5 MG/3ML) 0.083% nebulizer solution Take 2.5 mg by nebulization every 6 (six) hours as needed for wheezing or shortness of breath.   Yes [provider]  albuterol (VENTOLIN HFA) 108 (90 Base) MCG/ACT inhaler Inhale 1 puff into the lungs every 6 (six) hours as needed for wheezing or shortness of breath.   Yes [provider]  amLODipine-benazepril (LOTREL) 5-20 MG capsule Take 1 capsule by mouth daily.   Yes [provider]  ARIPiprazole ER (ABILIFY MAINTENA) 400 MG PRSY prefilled syringe Inject 400 mg into the muscle every 30 (thirty) days.   Yes  [provider]  dextromethorphan-guaiFENesin (MUCINEX DM) 30-600 MG 12hr tablet Take 1 tablet by mouth 2 (two) times daily. 05/22/19  Yes Lorella Nimrod, MD  divalproex (DEPAKOTE ER) 250 MG 24 hr tablet Take 500 mg by mouth at bedtime.   Yes [provider]  donepezil (ARICEPT) 5 MG tablet Take 5 mg by mouth at bedtime.   Yes [provider]  haloperidol (HALDOL) 5 MG tablet Take 2.5 mg by mouth daily.   Yes [provider]  hydrOXYzine (ATARAX/VISTARIL) 50 MG  tablet Take 50 mg by mouth every 6 (six) hours as needed for anxiety or itching.   Yes [provider]  Ipratropium-Albuterol (COMBIVENT RESPIMAT) 20-100 MCG/ACT AERS respimat Inhale 1 puff into the lungs every 6 (six) hours as needed for wheezing or shortness of breath.    Yes [provider]  levofloxacin (LEVAQUIN) 750 MG tablet Take 1 tablet (750 mg total) by mouth daily for 7 days. 05/22/19 05/29/19 Yes Lorella Nimrod, MD  LORazepam (ATIVAN) 1 MG tablet Take 1 tablet (1 mg total) by mouth every 6 (six) hours as needed for anxiety. 08/22/18  Yes Gouru, Illene Silver, MD  predniSONE (DELTASONE) 20 MG tablet Take 2 tablets (40 mg total) by mouth daily with breakfast for 3 days. 05/22/19 05/25/19 Yes Lorella Nimrod, MD  QUEtiapine (SEROQUEL) 400 MG tablet Take 800 mg by mouth at bedtime.   Yes [provider]  traZODone (DESYREL) 150 MG tablet Take 300 mg by mouth at bedtime.    Yes [provider]  trihexyphenidyl (ARTANE) 5 MG tablet Take 1 tablet (5 mg total) by mouth 3 (three) times daily with meals. 05/22/19  Yes Lorella Nimrod, MD  vitamin B-12 (CYANOCOBALAMIN) 1000 MCG tablet Take 1,000 mcg by mouth daily.   Yes [provider]    Physical Exam: Vitals:   05/23/19 2200 05/23/19 2215 05/23/19 2230 05/23/19 2245  BP: (!) 142/90 (!) 141/92 124/87 125/83  Pulse: 95 95 98 99  Resp: (!) 24 18 (!) 26 (!) 27  Temp:    98.2 F (36.8 C)  TempSrc:    Oral  SpO2: 95% 96% 94% 91%    Constitutional: Patient lying in the bed in no acute distress, he is calm and comfortable Vitals:   05/23/19 2200 05/23/19 2215 05/23/19 2230 05/23/19 2245  BP: (!) 142/90 (!) 141/92 124/87 125/83  Pulse: 95 95 98 99  Resp: (!) 24 18 (!) 26 (!) 27  Temp:    98.2 F (36.8 C)  TempSrc:    Oral  SpO2: 95% 96% 94% 91%   Vital Signs Reviewed: (Listed separately) GENERAL:  Patient is lying in the bed in no acute distress.  EYES:  No pallor.  No icterus.  Pupils are equally reactive to  light bilaterally. HENT: Normocephalic, atraumatic. Mucous membranes are moist.  NECK:  No JVD.  Neck is supple.  No lymphadenopathy. CARDIOVASCULAR:  S1, S2 heard. Rate and rhythm regular.   RESPIRATORY:  Bilateral air entry was diminished overall, no wheezing heard.  Tachypneic. ABDOMEN:  Soft and nontender. normoactive bowel sounds GENITOURINARY:  Deferred EXTREMITIES:  Warm with brisk capillary refill, no edema , no cyanosis. SKIN: warm and dry.  No rashes noted on limited skin examination. NEUROLOGIC: Patient is alert awake and oriented x3, no gross focal neurologic deficits appreciated. PSYCHIATRIC:  Calm and cooperative   Labs on Admission: I have personally reviewed following labs and imaging studies  CBC: Recent Labs  Lab 05/20/19 0455 05/21/19 0441 05/23/19 2135  WBC 6.9 9.9 7.7  NEUTROABS  --   --  5.0  HGB 15.4 14.4 15.5  HCT 45.5 42.0 46.3  MCV 89.2 87.1 88.7  PLT 207 185 [id number]    Basic Metabolic Panel: Recent Labs  Lab 05/20/19 0629 05/21/19 0441 05/23/19 2135  NA 139 138 139  K 3.7 4.2 4.0  CL 104 102 104  CO2 26 28 30   GLUCOSE 93 118* 127*  BUN 16 17 17   CREATININE 1.13 0.95 1.02  CALCIUM 9.0 9.1 9.4    GFR: Estimated Creatinine Clearance: 52.7 mL/min (by C-G formula based on SCr of 1.02 mg/dL).  Liver Function Tests: Recent Labs  Lab 05/20/19 0629 05/23/19 2135  AST 23 35  ALT 25 50*  ALKPHOS 68 72  BILITOT 0.5 0.6  PROT 6.7 6.5  ALBUMIN 3.7 3.7    Urine analysis:    Component Value Date/Time   COLORURINE STRAW (A) 11/10/2018 1451   APPEARANCEUR CLEAR (A) 11/10/2018 1451   APPEARANCEUR Clear 11/09/2013 1525   LABSPEC 1.010 11/10/2018 1451   LABSPEC 1.014 11/09/2013 [place] 7.0 11/10/2018 1451   GLUCOSEU NEGATIVE 11/10/2018 1451   GLUCOSEU Negative 11/09/2013 1525   HGBUR NEGATIVE 11/10/2018 [place] 11/10/2018 1451   BILIRUBINUR Negative 11/09/2013 [place] 11/10/2018 1451   PROTEINUR  NEGATIVE 11/10/2018 1451   NITRITE NEGATIVE 11/10/2018 1451   LEUKOCYTESUR NEGATIVE 11/10/2018 1451   LEUKOCYTESUR Negative 11/09/2013 1525    Radiological Exams on Admission: DG Chest Portable 1 View  Result Date: 05/23/2019 CLINICAL DATA:  Chest pain. EXAM: PORTABLE CHEST 1 VIEW COMPARISON:  May 20, 2019 FINDINGS: Emphysematous changes are again noted. The lungs are hyperexpanded. Again noted is a subtle opacity overlying the left lower lung zone which has slightly improved from the prior study. There is no pneumothorax. No large pleural effusion. The heart size is stable. IMPRESSION: 1. Improving left lower lung zone airspace opacity. 2. Emphysema. 3. No new focal infiltrate. Electronically Signed   By: Constance Holster M.D.   On: 05/23/2019 22:26    EKG: Independently reviewed.  Sinus tachycardia at 116 bpm.  Assessment/Plan Active Problems:   COPD exacerbation (HCC)   Mild COPD exacerbation, POA Chronic hypoxic respiratory failure, POA Recent left-sided community-acquired pneumonia, POA -We will admit the patient to the MedSurg floor -Continue supplemental oxygen, 2 L which he uses chronically, maintain saturations 88 to 92% -We will place him on duo nebs, Pulmicort and Brovana -Resume his prednisone 40 mg daily and taper it -Was supposed to be on Levaquin 750 mg p.o. daily for 7 days on discharge which we will continue -Continue Mucinex  Atypical chest pain, POA -Initial troponin was negative -No acute EKG changes -Cycle troponins  Hypertension.  -Continue home dose of Lotrel.  Schizophrenia -Continue home medications:Haldol, Ativan, Seroquel,artane,Depakote and trazodone  Vitamin B12 deficiency -Continue oral vitamin B12 supplementation  DVT prophylaxis: Subcu Lovenox Code Status: Full code Family Communication: No family available at the bedside Disposition Plan: Back to group home Consults called: None Admission status: Observation, MedSurg  Severity of  Illness: The appropriate patient status for this patient is OBSERVATION. Observation status is judged to be reasonable and necessary in order to provide the required intensity of service to ensure the patient's safety. The patient's presenting symptoms, physical exam findings, and initial radiographic and laboratory data in the context of their medical condition is felt to place them at decreased risk for further clinical deterioration. Furthermore, it is  anticipated that the patient will be medically stable for discharge from the hospital within 2 midnights of admission. The following factors support the patient status of observation.   " The patient's presenting symptoms include shortness of breath " The physical exam findings include diminished breath sounds, tachypnea. " The initial radiographic and laboratory data are concerning for COPD exacerbation    Jenkins Rouge MD [facility]  How to contact the [facility] Attending or Consulting provider [place] or covering provider during after hours [place], for this patient?   1. Check the care team in [facility] and look for a) attending/consulting TRH provider listed and b) the [facility] team listed 2. Log into [email or web address] and use [place]'s universal password to access. If you do not have the password, please contact the hospital operator. 3. Locate the [facility] provider you are looking for under [facility] and page to a number that you can be directly reached. 4. If you still have difficulty reaching the provider, please page the [facility] (Director on Call) for the Hospitalists listed on amion for assistance.  05/24/2019, 12:19 AM

## 2019-05-24 NOTE — Plan of Care (Signed)

## 2019-05-24 NOTE — Progress Notes (Signed)
OT Cancellation Note  Patient Details Name: Carlos Nelson MRN: [id number] DOB: 10/21/1950   Cancelled Treatment:    Reason Eval/Treat Not Completed: OT screened, no needs identified, will sign off. Per conversation c PT and RN, pt is at baseline and has no identified skilled acute OT needs. Will sign off. Please re-consult if needs arise.   Dessie Coma, M.S. OTR/L  05/24/19, 3:00 PM

## 2019-05-25 LAB — CULTURE, BLOOD (ROUTINE X 2)
Culture: NO GROWTH
Culture: NO GROWTH
Special Requests: ADEQUATE
Special Requests: ADEQUATE

## 2019-05-25 NOTE — Progress Notes (Addendum)
D: Pt alert and oriented x 4. Pt denies experiencing any pain at this time.   A: Pt received discharge and medication education/information. Pt belongings were gathered and taken with pt upon discharge.   R: Pt  verbalized understanding of discharge and medication education/information.  Pt escorted to medical mall front lobby by staff via wheelchair where pt's group staff home picked pt up.  Pt understands that previous medications need to be picked up from pharmacy from Friday.

## 2019-05-25 NOTE — TOC Progression Note (Signed)
Transition of Care [facility]) - Progression Note    Patient Details  Name: Carlos Nelson MRN: [id number] Date of Birth: 31-Dec-1950  Transition of Care [facility]) CM/SW Contact  Alejandrina Raimer, Gardiner Rhyme, LCSW Phone Number: 05/25/2019, 8:45 AM  Clinical Narrative:   Spoke with [place] facilitator to inform per MD pt is being discharged today. She has sent in prescriptions and this worker has faxed FL2, DC summary and med list. She is aware she will need to come and transport pt back to facility, she reports will be a few hours due to at appointment with another resident at this time. Will let beside RN know plan.         Expected Discharge Plan and Services                                                 Social Determinants of Health (SDOH) Interventions    Readmission Risk Interventions No flowsheet data found.

## 2019-05-25 NOTE — TOC Transition Note (Signed)
Transition of Care [facility]) - CM/SW Discharge Note   Patient Details  Name: Carlos Nelson MRN: [id number] Date of Birth: [birth year]  Transition of [facility]) CM/SW Contact:  Elease Hashimoto, LCSW Phone Number: 05/25/2019, 10:09 AM   Clinical Narrative:   [facility] to [facility] to [facility]. Marcea aware and will come and transport him back home. Have faxed paperwork to facility and has no equipment or follow up needs. Marcea to make sure obtains medications so he does not come back into the hospital.    Final next level of care: Home/Self Care Barriers to Discharge: Barriers Resolved   Patient Goals and CMS Choice        Discharge Placement                Patient to be transferred to facility by: Cleda Mccreedy lee-Group home facilitator Name of family member notified: Sister Patient and family notified of of transfer: 05/25/19  Discharge Plan and Services                                     Social Determinants of Health (SDOH) Interventions     Readmission Risk Interventions No flowsheet data found.

## 2019-05-25 NOTE — Discharge Summary (Signed)
[facility] NAME: Carlos Nelson    MR#:  [id number]  DATE OF BIRTH:  Apr 27, 1950  DATE OF ADMISSION:  05/23/2019 ADMITTING PHYSICIAN: Jenkins Rouge, MD  DATE OF DISCHARGE: 05/25/2019  PRIMARY CARE PHYSICIAN: Jodi Marble, MD    ADMISSION DIAGNOSIS:  COPD exacerbation ([place]) [J44.1] Chest pain, unspecified type [R07.9]  DISCHARGE DIAGNOSIS:  Acute on Chronic COPD exacerbation  SECONDARY DIAGNOSIS:   Past Medical History:  Diagnosis Date  . Chronic respiratory failure with hypoxia ([place])   . COPD (chronic obstructive pulmonary disease) ([place])   . Hypertension   . Schizophrenia [facility])     HOSPITAL COURSE:   LEDON MULDREW a 69 y.o.malewith medical history significant ofhypertension, COPD, schizophrenia, who presents with shortness breath. Patient was just discharge on May 7. Apparently he/group home was not able to get his medications. Came back with increasing shortness of breath.  Acute on chronic respiratory failure with hypoxiadue toCOPD exacerbationand possible CAP: -Chest x-ray from may 5th showed patchy infiltrationin the left basilar area,indicating possible pneumonia.Strepneumo antigen negative. -BloodCulture remain negative. -no respiratory distress. sats >94% onRA -resumed on prednisone 40 mg daily for 5 days (already has rx) -Continue levaquin (already has Rx from last friday) -Continue DuoNeb/pulmicort  Hypertension. -Continue home dose of Lotrel.  Schizophrenia ([name]): pt is calm andcooperative. -Continue home medications:Haldol, hydroxyzine, Ativan, Seroquel,artane,Depakote  Tobacco abuse -consult tobacco abuse cessation.  Procedures:none Family communication :none Consults :none CODE STATUS: FULL DVT Prophylaxis :lovenox  Status is: Inpatient  Dispo: The patient is from: Group home  Anticipated d/c is to: Group home  Anticipated d/c date  is:05/25/19  Patient currently is  medically stable to d/c. NO PT needs at home   CONSULTS OBTAINED:    DRUG ALLERGIES:   Allergies  Allergen Reactions  . Ace Inhibitors Swelling    Ok with benazapril, per grouphome    DISCHARGE MEDICATIONS:   Allergies as of 05/25/2019      Reactions   Ace Inhibitors Swelling   Ok with benazapril, per grouphome      Medication List    TAKE these medications   Abilify Maintena 400 MG Prsy prefilled syringe Generic drug: ARIPiprazole ER Inject 400 mg into the muscle every 30 (thirty) days.   albuterol (2.5 MG/3ML) 0.083% nebulizer solution Commonly known as: PROVENTIL Take 2.5 mg by nebulization every 6 (six) hours as needed for wheezing or shortness of breath. What changed: Another medication with the same name was removed. Continue taking this medication, and follow the directions you see here.   amLODipine-benazepril 5-20 MG capsule Commonly known as: LOTREL Take 1 capsule by mouth daily.   Combivent Respimat 20-100 MCG/ACT Aers respimat Generic drug: Ipratropium-Albuterol Inhale 1 puff into the lungs every 6 (six) hours as needed for wheezing or shortness of breath.   dextromethorphan-guaiFENesin 30-600 MG 12hr tablet Commonly known as: MUCINEX DM Take 1 tablet by mouth 2 (two) times daily.   divalproex 250 MG 24 hr tablet Commonly known as: DEPAKOTE ER Take 500 mg by mouth at bedtime.   donepezil 5 MG tablet Commonly known as: ARICEPT Take 5 mg by mouth at bedtime.   haloperidol 5 MG tablet Commonly known as: HALDOL Take 2.5 mg by mouth daily.   hydrOXYzine 50 MG tablet Commonly known as: ATARAX/VISTARIL Take 50 mg by mouth every 6 (six) hours as needed for anxiety or itching.   levofloxacin 750 MG tablet Commonly known as: Levaquin Take 1 tablet (750 mg  total) by mouth daily for 7 days.   LORazepam 1 MG tablet Commonly known as: ATIVAN Take 1 tablet (1 mg total) by mouth every 6 (six) hours as  needed for anxiety.   predniSONE 20 MG tablet Commonly known as: DELTASONE Take 2 tablets (40 mg total) by mouth daily with breakfast for 3 days.   QUEtiapine 400 MG tablet Commonly known as: SEROQUEL Take 800 mg by mouth at bedtime.   traZODone 150 MG tablet Commonly known as: DESYREL Take 300 mg by mouth at bedtime.   trihexyphenidyl 5 MG tablet Commonly known as: ARTANE Take 1 tablet (5 mg total) by mouth 3 (three) times daily with meals.   vitamin B-12 1000 MCG tablet Commonly known as: CYANOCOBALAMIN Take 1,000 mcg by mouth daily.       If you experience worsening of your admission symptoms, develop shortness of breath, life threatening emergency, suicidal or homicidal thoughts you must seek medical attention immediately by calling 911 or calling your MD immediately  if symptoms less severe.  You Must read complete instructions/literature along with all the possible adverse reactions/side effects for all the Medicines you take and that have been prescribed to you. Take any new Medicines after you have completely understood and accept all the possible adverse reactions/side effects.   Please note  You were cared for by a hospitalist during your hospital stay. If you have any questions about your discharge medications or the care you received while you were in the hospital after you are discharged, you can call the unit and asked to speak with the hospitalist on call if the hospitalist that took care of you is not available. Once you are discharged, your primary care physician will handle any further medical issues. Please note that NO REFILLS for any discharge medications will be authorized once you are discharged, as it is imperative that you return to your primary care physician (or establish a relationship with a primary care physician if you do not have one) for your aftercare needs so that they can reassess your need for medications and monitor your lab values. Today    SUBJECTIVE    Feels ok. No respiratory distress VITAL SIGNS:  Blood pressure (!) 160/93, pulse 85, temperature 98.7 F (37.1 C), temperature source Oral, resp. rate 14, height 5\' 4"  (1.626 m), weight 57.8 kg, SpO2 95 %.  I/O:    Intake/Output Summary (Last 24 hours) at 05/25/2019 0917 Last data filed at 05/25/2019 0403 Gross per 24 hour  Intake 840 ml  Output 1900 ml  Net -1060 ml    PHYSICAL EXAMINATION:  GENERAL:  69 y.o.-year-old patient lying in the bed with no acute distress.  EYES: Pupils equal, round, reactive to light and accommodation. No scleral icterus.  HEENT: Head atraumatic, normocephalic. Oropharynx and nasopharynx clear.  NECK:  Supple, no jugular venous distention. No thyroid enlargement, no tenderness.  LUNGS: Normal breath sounds bilaterally, no wheezing, rales,rhonchi or crepitation. No use of accessory muscles of respiration.  CARDIOVASCULAR: S1, S2 normal. No murmurs, rubs, or gallops.  ABDOMEN: Soft, non-tender, non-distended. Bowel sounds present. No organomegaly or mass.  GU: pt is uncircumcised. No lesions or any crusting noted under the foreskin. No tenderness present (exam was done in presence of his RN skylee) EXTREMITIES: No pedal edema, cyanosis, or clubbing.  NEUROLOGIC: Cranial nerves II through XII are intact. Muscle strength 5/5 in all extremities. Sensation intact. Gait not checked.  PSYCHIATRIC: The patient is alert SKIN: No obvious rash, lesion, or ulcer.  DATA REVIEW:   CBC  Recent Labs  Lab 05/24/19 0333  WBC 7.6  HGB 14.3  HCT 44.0  PLT 217    Chemistries  Recent Labs  Lab 05/23/19 2135 05/23/19 2135 05/24/19 0333  NA 139   < > 140  K 4.0   < > 3.9  CL 104   < > 103  CO2 30   < > 29  GLUCOSE 127*   < > 98  BUN 17   < > 16  CREATININE 1.02   < > 0.98  CALCIUM 9.4   < > 9.1  AST 35  --   --   ALT 50*  --   --   ALKPHOS 72  --   --   BILITOT 0.6  --   --    < > = values in this interval not displayed.     Microbiology Results   Recent Results (from the past 240 hour(s))  Respiratory Panel by RT PCR (Flu A&B, Covid) - Nasopharyngeal Swab     Status: None   Collection Time: 05/20/19  4:55 AM   Specimen: Nasopharyngeal Swab  Result Value Ref Range Status   SARS Coronavirus 2 by RT PCR NEGATIVE NEGATIVE Final    Comment: (NOTE) SARS-CoV-2 target nucleic acids are NOT DETECTED. The SARS-CoV-2 RNA is generally detectable in upper respiratoy specimens during the acute phase of infection. The lowest concentration of SARS-CoV-2 viral copies this assay can detect is 131 copies/mL. A negative result does not preclude SARS-Cov-2 infection and should not be used as the sole basis for treatment or other patient management decisions. A negative result may occur with  improper specimen collection/handling, submission of specimen other than nasopharyngeal swab, presence of viral mutation(s) within the areas targeted by this assay, and inadequate number of viral copies (<131 copies/mL). A negative result must be combined with clinical observations, patient history, and epidemiological information. The expected result is Negative. Fact Sheet for Patients:  PinkCheek.be Fact Sheet for Healthcare Providers:  GravelBags.it This test is not yet ap proved or cleared by the [place] FDA and  has been authorized for detection and/or diagnosis of SARS-CoV-2 by FDA under an Emergency Use Authorization (EUA). This EUA will remain  in effect (meaning this test can be used) for the duration of the COVID-19 declaration under Section 564(b)(1) of the Act, 21 U.S.C. section 360bbb-3(b)(1), unless the authorization is terminated or revoked sooner.    Influenza A by PCR NEGATIVE NEGATIVE Final   Influenza B by PCR NEGATIVE NEGATIVE Final    Comment: (NOTE) The Xpert Xpress SARS-CoV-2/FLU/RSV assay is intended as an aid in  the diagnosis of  influenza from Nasopharyngeal swab specimens and  should not be used as a sole basis for treatment. Nasal washings and  aspirates are unacceptable for Xpert Xpress SARS-CoV-2/FLU/RSV  testing. Fact Sheet for Patients: PinkCheek.be Fact Sheet for Healthcare Providers: GravelBags.it This test is not yet approved or cleared by the [place] FDA and  has been authorized for detection and/or diagnosis of SARS-CoV-2 by  FDA under an Emergency Use Authorization (EUA). This EUA will remain  in effect (meaning this test can be used) for the duration of the  Covid-19 declaration under Section 564(b)(1) of the Act, 21  U.S.C. section 360bbb-3(b)(1), unless the authorization is  terminated or revoked. Performed at [facility], [address] [place], [place] [address]   Culture, blood (Routine X 2) w Reflex to ID Panel  Status: None   Collection Time: 05/20/19  8:12 AM   Specimen: BLOOD  Result Value Ref Range Status   Specimen Description BLOOD LEFT ANTECUBITAL  Final   Special Requests   Final    BOTTLES DRAWN AEROBIC AND ANAEROBIC Blood Culture adequate volume   Culture   Final    NO GROWTH 5 DAYS Performed at [facility], [address] [place], [place] [address]    Report Status 05/25/2019 FINAL  Final  Culture, blood (Routine X 2) w Reflex to ID Panel     Status: None   Collection Time: 05/20/19  8:12 AM   Specimen: BLOOD  Result Value Ref Range Status   Specimen Description BLOOD BLOOD LEFT FOREARM  Final   Special Requests   Final    BOTTLES DRAWN AEROBIC AND ANAEROBIC Blood Culture adequate volume   Culture   Final    NO GROWTH 5 DAYS Performed at [facility], [address], [place], [place] [address]    Report Status 05/25/2019 FINAL  Final  Culture, sputum-assessment     Status: None   Collection Time: 05/21/19  9:52 AM   Specimen: Expectorated Sputum  Result Value Ref Range  Status   Specimen Description EXPECTORATED SPUTUM  Final   Special Requests NONE  Final   Sputum evaluation   Final    Sputum specimen not acceptable for testing.  Please recollect.   SPOKE WITH STEVE IMHOFF AT 1128 05/21/19 REQUESTED RECOLLECTION SDR Performed at [facility], [address] [place],  [address]    Report Status 05/21/2019 FINAL  Final  Respiratory Panel by RT PCR (Flu A&B, Covid) - Nasopharyngeal Swab     Status: None   Collection Time: 05/24/19  1:21 AM   Specimen: Nasopharyngeal Swab  Result Value Ref Range Status   SARS Coronavirus 2 by RT PCR NEGATIVE NEGATIVE Final    Comment: (NOTE) SARS-CoV-2 target nucleic acids are NOT DETECTED. The SARS-CoV-2 RNA is generally detectable in upper respiratoy specimens during the acute phase of infection. The lowest concentration of SARS-CoV-2 viral copies this assay can detect is 131 copies/mL. A negative result does not preclude SARS-Cov-2 infection and should not be used as the sole basis for treatment or other patient management decisions. A negative result may occur with  improper specimen collection/handling, submission of specimen other than nasopharyngeal swab, presence of viral mutation(s) within the areas targeted by this assay, and inadequate number of viral copies (<131 copies/mL). A negative result must be combined with clinical observations, patient history, and epidemiological information. The expected result is Negative. Fact Sheet for Patients:  PinkCheek.be Fact Sheet for Healthcare Providers:  GravelBags.it This test is not yet ap proved or cleared by the [place] FDA and  has been authorized for detection and/or diagnosis of SARS-CoV-2 by FDA under an Emergency Use Authorization (EUA). This EUA will remain  in effect (meaning this test can be used) for the duration of the COVID-19 declaration under Section 564(b)(1) of the  Act, 21 U.S.C. section 360bbb-3(b)(1), unless the authorization is terminated or revoked sooner.    Influenza A by PCR NEGATIVE NEGATIVE Final   Influenza B by PCR NEGATIVE NEGATIVE Final    Comment: (NOTE) The Xpert Xpress SARS-CoV-2/FLU/RSV assay is intended as an aid in  the diagnosis of influenza from Nasopharyngeal swab specimens and  should not be used as a sole basis for treatment. Nasal washings and  aspirates are unacceptable for Xpert Xpress SARS-CoV-2/FLU/RSV  testing. Fact  Sheet for Patients: PinkCheek.be Fact Sheet for Healthcare Providers: GravelBags.it This test is not yet approved or cleared by the [place] FDA and  has been authorized for detection and/or diagnosis of SARS-CoV-2 by  FDA under an Emergency Use Authorization (EUA). This EUA will remain  in effect (meaning this test can be used) for the duration of the  Covid-19 declaration under Section 564(b)(1) of the Act, 21  U.S.C. section 360bbb-3(b)(1), unless the authorization is  terminated or revoked. Performed at [facility], [address] [place], [place] [address]   MRSA PCR Screening     Status: None   Collection Time: 05/24/19  5:31 AM   Specimen: Nasopharyngeal  Result Value Ref Range Status   MRSA by PCR NEGATIVE NEGATIVE Final    Comment:        The GeneXpert MRSA Assay (FDA approved for NASAL specimens only), is one component of a comprehensive MRSA colonization surveillance program. It is not intended to diagnose MRSA infection nor to guide or monitor treatment for MRSA infections. Performed at [facility], [address] [place], [place] [address]     RADIOLOGY:  DG Chest Portable 1 View  Result Date: 05/23/2019 CLINICAL DATA:  Chest pain. EXAM: PORTABLE CHEST 1 VIEW COMPARISON:  May 20, 2019 FINDINGS: Emphysematous changes are again noted. The lungs are hyperexpanded. Again noted is a subtle opacity  overlying the left lower lung zone which has slightly improved from the prior study. There is no pneumothorax. No large pleural effusion. The heart size is stable. IMPRESSION: 1. Improving left lower lung zone airspace opacity. 2. Emphysema. 3. No new focal infiltrate. Electronically Signed   By: Constance Holster M.D.   On: 05/23/2019 22:26     CODE STATUS:     Code Status Orders  (From admission, onward)         Start     Ordered   05/24/19 0015  Full code  Continuous     05/24/19 0018        Code Status History    Date Active Date Inactive Code Status Order ID Comments User Context   05/20/2019 0827 05/22/2019 2049 Full Code [id number]  Ivor Costa, MD ED   08/21/2018 0446 08/22/2018 2048 Full Code [id number]  Harrie Foreman, MD Inpatient   Advance Care Planning Activity       TOTAL TIME TAKING CARE OF THIS PATIENT: 40 minutes.    Fritzi Mandes M.D  [facility]    CC: Primary care physician; Jodi Marble, MD

## 2019-05-25 NOTE — Discharge Instructions (Signed)
-  Advised smoking cessation

## 2019-05-29 ENCOUNTER — Encounter: Payer: Self-pay | Admitting: Urology

## 2019-05-29 ENCOUNTER — Other Ambulatory Visit: Payer: Self-pay

## 2019-05-29 ENCOUNTER — Ambulatory Visit (INDEPENDENT_AMBULATORY_CARE_PROVIDER_SITE_OTHER): Payer: Medicare Other | Admitting: Urology

## 2019-05-29 VITALS — BP 149/92 | HR 106 | Ht 65.0 in | Wt 125.0 lb

## 2019-05-29 DIAGNOSIS — N4889 Other specified disorders of penis: Secondary | ICD-10-CM | POA: Diagnosis not present

## 2019-05-29 DIAGNOSIS — N400 Enlarged prostate without lower urinary tract symptoms: Secondary | ICD-10-CM | POA: Diagnosis not present

## 2019-05-29 NOTE — Progress Notes (Signed)
05/29/2019 2:29 PM   Carlos Nelson 1950/09/16 [id number]  Referring provider: Jodi Marble, MD [place],  [place] [address]  Chief Complaint  Patient presents with  . Penis Pain    HPI:  He reports pain in the penis, groin and pelvis. Going on for about 8 months. No voiding complaints. No hematuria or UTI. No incontinence. No constipation. Drinks mostly water, juice.   He has COPD. No abd imaging.   At the end he mentioned something about an erection and we can follow-up on that.   PMH: Past Medical History:  Diagnosis Date  . Chronic respiratory failure with hypoxia ([place])   . COPD (chronic obstructive pulmonary disease) ([place])   . Hypertension   . Schizophrenia [facility])     Surgical History: Past Surgical History:  Procedure Laterality Date  . COLONOSCOPY    . COLONOSCOPY WITH PROPOFOL N/A 12/13/2016   Procedure: COLONOSCOPY WITH PROPOFOL;  Surgeon: Lollie Sails, MD;  Location: [facility] ENDOSCOPY;  Service: Endoscopy;  Laterality: N/A;  . ESOPHAGOGASTRODUODENOSCOPY (EGD) WITH PROPOFOL N/A 12/13/2016   Procedure: ESOPHAGOGASTRODUODENOSCOPY (EGD) WITH PROPOFOL;  Surgeon: Lollie Sails, MD;  Location[facility] ENDOSCOPY;  Service: Endoscopy;  Laterality: N/A;  . left arm surgery      Home Medications:  Allergies as of 05/29/2019      Reactions   Ace Inhibitors Swelling   Ok with benazapril, per grouphome      Medication List       Accurate as of May 29, 2019  2:29 PM. If you have any questions, ask your nurse or doctor.        Abilify Maintena 400 MG Prsy prefilled syringe Generic drug: ARIPiprazole ER Inject 400 mg into the muscle every 30 (thirty) days.   albuterol (2.5 MG/3ML) 0.083% nebulizer solution Commonly known as: PROVENTIL Take 2.5 mg by nebulization every 6 (six) hours as needed for wheezing or shortness of breath.   amLODipine-benazepril 5-20 MG capsule Commonly known as: LOTREL Take 1 capsule by mouth daily.     Combivent Respimat 20-100 MCG/ACT Aers respimat Generic drug: Ipratropium-Albuterol Inhale 1 puff into the lungs every 6 (six) hours as needed for wheezing or shortness of breath.   dextromethorphan-guaiFENesin 30-600 MG 12hr tablet Commonly known as: MUCINEX DM Take 1 tablet by mouth 2 (two) times daily.   divalproex 250 MG 24 hr tablet Commonly known as: DEPAKOTE ER Take 500 mg by mouth at bedtime.   donepezil 5 MG tablet Commonly known as: ARICEPT Take 5 mg by mouth at bedtime.   haloperidol 5 MG tablet Commonly known as: HALDOL Take 2.5 mg by mouth daily.   hydrOXYzine 50 MG tablet Commonly known as: ATARAX/VISTARIL Take 50 mg by mouth every 6 (six) hours as needed for anxiety or itching.   levofloxacin 750 MG tablet Commonly known as: Levaquin Take 1 tablet (750 mg total) by mouth daily for 7 days.   LORazepam 1 MG tablet Commonly known as: ATIVAN Take 1 tablet (1 mg total) by mouth every 6 (six) hours as needed for anxiety.   QUEtiapine 400 MG tablet Commonly known as: SEROQUEL Take 800 mg by mouth at bedtime.   traZODone 150 MG tablet Commonly known as: DESYREL Take 300 mg by mouth at bedtime.   trihexyphenidyl 5 MG tablet Commonly known as: ARTANE Take 1 tablet (5 mg total) by mouth 3 (three) times daily with meals.   vitamin B-12 1000 MCG tablet Commonly known as: CYANOCOBALAMIN Take 1,000 mcg by  mouth daily.       Allergies:  Allergies  Allergen Reactions  . Ace Inhibitors Swelling    Ok with benazapril, per grouphome    Family History: Family History  Problem Relation Age of Onset  . Prostate cancer Neg Hx   . Bladder Cancer Neg Hx     Social History:  reports that he has quit smoking. His smoking use included cigarettes. He smoked 1.00 pack per day. He has never used smokeless tobacco. He reports current alcohol use. He reports current drug use. Drug: Marijuana.   Physical Exam: BP (!) 149/92   Pulse (!) 106   Ht 5\' 5"  (1.651 m)    Wt 125 lb (56.7 kg)   BMI 20.80 kg/m   Constitutional:  Alert and oriented, No acute distress. HEENT: [place] AT, moist mucus membranes.  Trachea midline, no masses. Cardiovascular: No clubbing, cyanosis, or edema. Respiratory: Normal respiratory effort, no increased work of breathing. GI: Abdomen is soft, nontender, nondistended, no abdominal masses GU: No CVA tenderness; GU: Penis uncircumcised, no phimosis, normal foreskin, testicles descended bilaterally and palpably normal, bilateral epididymis palpably normal, scrotum normal DRE: Prostate 30 g, smooth without hard area or nodule; No inguinal hernia  Lymph: No cervical or inguinal lymphadenopathy. Skin: No rashes, bruises or suspicious lesions. Neurologic: Grossly intact, no focal deficits, moving all 4 extremities. Psychiatric: Normal mood and affect.  Laboratory Data: Lab Results  Component Value Date   WBC 7.6 05/24/2019   HGB 14.3 05/24/2019   HCT 44.0 05/24/2019   MCV 91.5 05/24/2019   PLT 217 05/24/2019    Lab Results  Component Value Date   CREATININE 0.98 05/24/2019    No results found for: PSA  No results found for: TESTOSTERONE  Lab Results  Component Value Date   HGBA1C 5.5 11/17/2013    Urinalysis    Component Value Date/Time   COLORURINE STRAW (A) 11/10/2018 1451   APPEARANCEUR CLEAR (A) 11/10/2018 1451   APPEARANCEUR Clear 11/09/2013 1525   LABSPEC 1.010 11/10/2018 1451   LABSPEC 1.014 11/09/2013 1525   PHURINE 7.0 11/10/2018 1451   GLUCOSEU NEGATIVE 11/10/2018 1451   GLUCOSEU Negative 11/09/2013 1525   HGBUR NEGATIVE 11/10/2018 [place] 11/10/2018 1451   BILIRUBINUR Negative 11/09/2013 [place] 11/10/2018 1451   PROTEINUR NEGATIVE 11/10/2018 1451   NITRITE NEGATIVE 11/10/2018 1451   LEUKOCYTESUR NEGATIVE 11/10/2018 1451   LEUKOCYTESUR Negative 11/09/2013 1525    Lab Results  Component Value Date   BACTERIA NONE SEEN 11/10/2018    Pertinent  Imaging: n/a No results found for this or any previous visit. No results found for this or any previous visit. No results found for this or any previous visit. No results found for this or any previous visit. No results found for this or any previous visit. No results found for this or any previous visit. No results found for this or any previous visit. No results found for this or any previous visit.  Assessment & Plan:    1. Penis pain Exam and UA clear. PSA sent. Discussed cystoscopy and he will consider. Pelvic floor PT can help and he will consider.  - Urinalysis, Complete   No follow-ups on file.  Festus Aloe, MD  [facility] [address] [place], [place] [address] [phone]

## 2019-05-29 NOTE — Patient Instructions (Signed)
Pelvic Floor Dysfunction  Pelvic floor dysfunction (PFD) is a condition that results when the group of muscles and connective tissues that support the organs in the pelvis (pelvic floor muscles) do not work well. These muscles and their connections form a sling that supports the colon and bladder. In men, these muscles also support the prostate gland. In women, they also support the uterus. PFD causes pelvic floor muscles to be too weak, too tight, or a combination of both. In PFD, muscle movements are not coordinated. This condition may cause bowel or bladder problems. It may also cause pain. What are the causes? This condition may be caused by an injury to the pelvic area or by a weakening of pelvic muscles. This often results from pregnancy and childbirth or other types of strain. In many cases, the exact cause is not known. What increases the risk? The following factors may make you more likely to develop this condition:  Having a condition of chronic bladder tissue inflammation (interstitial cystitis).  Being an older person.  Being overweight.  Radiation treatment for cancer in the pelvic region.  Previous pelvic surgery, such as removal of the uterus (hysterectomy) or prostate gland (prostatectomy). What are the signs or symptoms? Symptoms of this condition vary and may include:  Bladder symptoms, such as: ? Trouble starting urination and emptying the bladder. ? Frequent urinary tract infections. ? Leaking urine when coughing, laughing, or exercising (stress incontinence). ? Having to pass urine urgently or frequently. ? Pain when passing urine.  Bowel symptoms, such as: ? Constipation. ? Urgent or frequent bowel movements. ? Incomplete bowel movements. ? Painful bowel movements. ? Leaking stool or gas.  Unexplained genital or rectal pain.  Genital or rectal muscle spasms.  Low back pain. In women, symptoms of PFD may also include:  A heavy, full, or aching feeling in  the vagina.  A bulge that protrudes into the vagina.  Pain during or after sexual intercourse. How is this diagnosed? This condition may be diagnosed based on:  Your symptoms and medical history.  A physical exam. During the exam, your health care provider may check your pelvic muscles for tightness, spasm, pain, or weakness. This may include a rectal exam and a pelvic exam for women. In some cases, you may have diagnostic tests, such as:  Electrical muscle function tests.  Urine flow testing.  X-ray tests of bowel function.  Ultrasound of the pelvic organs. How is this treated? Treatment for this condition depends on your symptoms. Treatment options include:  Physical therapy. This may include Kegel exercises to help relax or strengthen the pelvic floor muscles.  Biofeedback. This type of therapy provides feedback on how tight your pelvic floor muscles are so that you can learn to control them.  Internal or external massage therapy.  A treatment that involves electrical stimulation of the pelvic floor muscles to help control pain (transcutaneous electrical nerve stimulation, or TENS).  Sound wave therapy (ultrasound) to reduce muscle spasms.  Medicines, such as: ? Muscle relaxants. ? Bladder control medicines. Surgery to reconstruct or support pelvic floor muscles may be an option if other treatments do not help. Follow these instructions at home: Activity  Do your usual activities as told by your health care provider. Ask your health care provider if you should modify any activities.  Do pelvic floor strengthening or relaxing exercises at home as told by your physical therapist. Lifestyle  Maintain a healthy weight.  Eat foods that are high in fiber, such as  beans, whole grains, and fresh fruits and vegetables.  Limit foods that are high in fat and processed sugars, such as fried or sweet foods.  Manage stress with relaxation techniques such as yoga or  meditation. General instructions  If you have problems with leakage: ? Use absorbable pads or wear padded underwear. ? Wash frequently with mild soap. ? Keep your genital and anal area as clean and dry as possible. ? Ask your health care provider if you should try a barrier cream to prevent skin irritation.  Take warm baths to relieve pelvic muscle tension or spasms.  Take over-the-counter and prescription medicines only as told by your health care provider.  Keep all follow-up visits as told by your health care provider. This is important. Contact a health care provider if you:  Are not improving with home care.  Have signs or symptoms of PFD that get worse at home.  Develop new signs or symptoms at home.  Have signs of a urinary tract infection, such as: ? Fever. ? Chills. ? Urinary frequency. ? A burning feeling when urinating.  Have not had a bowel movement in 3 days (constipation). Summary  Pelvic floor dysfunction results when the muscles and connective tissues in your pelvic floor do not work well.  These muscles and their connections form a sling that supports your colon and bladder. In men, these muscles also support the prostate gland. In women, they also support the uterus.  PFD may be caused by an injury to the pelvic area or by a weakening of pelvic muscles.  PFD causes pelvic floor muscles to be too weak, too tight, or a combination of both. Symptoms may vary from person to person.  In most cases, PFD can be treated with physical therapies and medicines. Surgery may be an option if other treatments do not help. This information is not intended to replace advice given to you by your health care provider. Make sure you discuss any questions you have with your health care provider. Document Revised: 07/22/2017 Document Reviewed: 07/22/2017 Elsevier Patient Education  2020 Elsevier Inc.  

## 2019-05-30 LAB — URINALYSIS, COMPLETE
Bilirubin, UA: NEGATIVE
Glucose, UA: NEGATIVE
Ketones, UA: NEGATIVE
Leukocytes,UA: NEGATIVE
Nitrite, UA: NEGATIVE
Protein,UA: NEGATIVE
RBC, UA: NEGATIVE
Specific Gravity, UA: >1.03 — ABNORMAL HIGH (ref 1.005–1.030)
Urobilinogen, Ur: 0.2 mg/dL (ref 0.2–1.0)
pH, UA: 6 (ref 5.0–7.5)

## 2019-05-30 LAB — MICROSCOPIC EXAMINATION
Bacteria, UA: NONE SEEN
Epithelial Cells (non renal): NONE SEEN /hpf (ref 0–10)

## 2019-05-30 LAB — PSA: Prostate Specific Ag, Serum: 8.5 ng/mL — ABNORMAL HIGH (ref 0.0–4.0)

## 2019-06-05 ENCOUNTER — Telehealth: Payer: Self-pay

## 2019-06-05 NOTE — Telephone Encounter (Signed)
-----   Message from [place], [place] sent at 06/05/2019 10:28 AM EDT -----  ----- Message ----- From: Chrystie Nose, CMA Sent: 06/05/2019   7:32 AM EDT To: Alvera Novel, CMA   ----- Message ----- From: Festus Aloe, MD Sent: 06/04/2019   8:20 PM EDT To: Chrystie Nose, CMA  Jessica - let Claud know his PSA is elevated and we need to recheck it in 3 months prior to his follow-up appointment. Please schedule him for PSA check prior to his 3 months f/u appt.  ----- Message ----- From: Chrystie Nose, CMA Sent: 06/01/2019   7:36 AM EDT To: Festus Aloe, MD   ----- Message ----- From: Interface, [facility] Lab Results In Sent: 05/30/2019   5:38 AM EDT To: Rowe Robert Clinical

## 2019-06-05 NOTE — Telephone Encounter (Signed)
-----   Message from [place], [place] sent at 06/05/2019 10:28 AM EDT -----  ----- Message ----- From: Chrystie Nose, CMA Sent: 06/05/2019   7:32 AM EDT To: Alvera Novel, CMA   ----- Message ----- From: Festus Aloe, MD Sent: 06/04/2019   8:20 PM EDT To: Chrystie Nose, CMA  Jessica - let Arol know his PSA is elevated and we need to recheck it in 3 months prior to his follow-up appointment. Please schedule him for PSA check prior to his 3 months f/u appt.  ----- Message ----- From: Chrystie Nose, CMA Sent: 06/01/2019   7:36 AM EDT To: Festus Aloe, MD   ----- Message ----- From: Interface, [facility] Lab Results In Sent: 05/30/2019   5:38 AM EDT To: Rowe Robert Clinical

## 2019-06-05 NOTE — Telephone Encounter (Signed)
Notified Carlos Nelson as advised. Appt made to recheck PSA on 8/17 prior to his 8/20 office visit. Appt reminders printed and mailed.

## 2019-06-09 ENCOUNTER — Emergency Department: Payer: Medicare Other

## 2019-06-09 ENCOUNTER — Inpatient Hospital Stay
Admission: EM | Admit: 2019-06-09 | Discharge: 2019-06-12 | DRG: 190 | Disposition: A | Discharge status: Home / Self Care | Payer: Medicare Other | Attending: Internal Medicine | Admitting: Internal Medicine

## 2019-06-09 ENCOUNTER — Other Ambulatory Visit: Payer: Self-pay

## 2019-06-09 ENCOUNTER — Inpatient Hospital Stay (HOSPITAL_COMMUNITY)
Admit: 2019-06-09 | Discharge: 2019-06-09 | Disposition: A | Discharge status: Home / Self Care | Payer: Medicare Other | Attending: Physician Assistant | Admitting: Physician Assistant

## 2019-06-09 DIAGNOSIS — I10 Essential (primary) hypertension: Secondary | ICD-10-CM | POA: Diagnosis present

## 2019-06-09 DIAGNOSIS — Z6820 Body mass index (BMI) 20.0-20.9, adult: Secondary | ICD-10-CM | POA: Diagnosis not present

## 2019-06-09 DIAGNOSIS — J9621 Acute and chronic respiratory failure with hypoxia: Secondary | ICD-10-CM

## 2019-06-09 DIAGNOSIS — Z20822 Contact with and (suspected) exposure to covid-19: Secondary | ICD-10-CM | POA: Diagnosis present

## 2019-06-09 DIAGNOSIS — I25118 Atherosclerotic heart disease of native coronary artery with other forms of angina pectoris: Secondary | ICD-10-CM | POA: Diagnosis present

## 2019-06-09 DIAGNOSIS — N5082 Scrotal pain: Secondary | ICD-10-CM | POA: Diagnosis not present

## 2019-06-09 DIAGNOSIS — J9601 Acute respiratory failure with hypoxia: Secondary | ICD-10-CM | POA: Diagnosis not present

## 2019-06-09 DIAGNOSIS — F329 Major depressive disorder, single episode, unspecified: Secondary | ICD-10-CM | POA: Diagnosis present

## 2019-06-09 DIAGNOSIS — I248 Other forms of acute ischemic heart disease: Secondary | ICD-10-CM | POA: Diagnosis not present

## 2019-06-09 DIAGNOSIS — R079 Chest pain, unspecified: Secondary | ICD-10-CM | POA: Diagnosis not present

## 2019-06-09 DIAGNOSIS — Z79899 Other long term (current) drug therapy: Secondary | ICD-10-CM | POA: Diagnosis not present

## 2019-06-09 DIAGNOSIS — F419 Anxiety disorder, unspecified: Secondary | ICD-10-CM | POA: Diagnosis present

## 2019-06-09 DIAGNOSIS — R52 Pain, unspecified: Secondary | ICD-10-CM

## 2019-06-09 DIAGNOSIS — F1290 Cannabis use, unspecified, uncomplicated: Secondary | ICD-10-CM | POA: Diagnosis present

## 2019-06-09 DIAGNOSIS — I252 Old myocardial infarction: Secondary | ICD-10-CM

## 2019-06-09 DIAGNOSIS — I214 Non-ST elevation (NSTEMI) myocardial infarction: Secondary | ICD-10-CM | POA: Insufficient documentation

## 2019-06-09 DIAGNOSIS — R0602 Shortness of breath: Secondary | ICD-10-CM | POA: Diagnosis not present

## 2019-06-09 DIAGNOSIS — F209 Schizophrenia, unspecified: Secondary | ICD-10-CM | POA: Diagnosis not present

## 2019-06-09 DIAGNOSIS — J9622 Acute and chronic respiratory failure with hypercapnia: Secondary | ICD-10-CM

## 2019-06-09 DIAGNOSIS — Z888 Allergy status to other drugs, medicaments and biological substances status: Secondary | ICD-10-CM | POA: Diagnosis not present

## 2019-06-09 DIAGNOSIS — Z9981 Dependence on supplemental oxygen: Secondary | ICD-10-CM

## 2019-06-09 DIAGNOSIS — F418 Other specified anxiety disorders: Secondary | ICD-10-CM | POA: Diagnosis present

## 2019-06-09 DIAGNOSIS — R7401 Elevation of levels of liver transaminase levels: Secondary | ICD-10-CM

## 2019-06-09 DIAGNOSIS — E440 Moderate protein-calorie malnutrition: Secondary | ICD-10-CM | POA: Diagnosis present

## 2019-06-09 DIAGNOSIS — F17200 Nicotine dependence, unspecified, uncomplicated: Secondary | ICD-10-CM | POA: Diagnosis present

## 2019-06-09 DIAGNOSIS — R000 Tachycardia, unspecified: Secondary | ICD-10-CM | POA: Diagnosis present

## 2019-06-09 DIAGNOSIS — R0902 Hypoxemia: Secondary | ICD-10-CM

## 2019-06-09 DIAGNOSIS — J441 Chronic obstructive pulmonary disease with (acute) exacerbation: Principal | ICD-10-CM | POA: Diagnosis present

## 2019-06-09 DIAGNOSIS — J9600 Acute respiratory failure, unspecified whether with hypoxia or hypercapnia: Secondary | ICD-10-CM

## 2019-06-09 DIAGNOSIS — K0889 Other specified disorders of teeth and supporting structures: Secondary | ICD-10-CM | POA: Diagnosis present

## 2019-06-09 DIAGNOSIS — I2489 Other forms of acute ischemic heart disease: Secondary | ICD-10-CM

## 2019-06-09 DIAGNOSIS — R778 Other specified abnormalities of plasma proteins: Secondary | ICD-10-CM | POA: Diagnosis not present

## 2019-06-09 LAB — URINE DRUG SCREEN, QUALITATIVE (ARMC ONLY)
Amphetamines, Ur Screen: NOT DETECTED
Barbiturates, Ur Screen: NOT DETECTED
Benzodiazepine, Ur Scrn: NOT DETECTED
Cannabinoid 50 Ng, Ur ~~LOC~~: NOT DETECTED
Cocaine Metabolite,Ur ~~LOC~~: NOT DETECTED
MDMA (Ecstasy)Ur Screen: NOT DETECTED
Methadone Scn, Ur: NOT DETECTED
Opiate, Ur Screen: NOT DETECTED
Phencyclidine (PCP) Ur S: NOT DETECTED
Tricyclic, Ur Screen: POSITIVE — AB

## 2019-06-09 LAB — CBC WITH DIFFERENTIAL/PLATELET
Abs Immature Granulocytes: 0.04 10*3/uL (ref 0.00–0.07)
Basophils Absolute: 0 10*3/uL (ref 0.0–0.1)
Basophils Relative: 0 %
Eosinophils Absolute: 0.1 10*3/uL (ref 0.0–0.5)
Eosinophils Relative: 2 %
HCT: 40.4 % (ref 39.0–52.0)
Hemoglobin: 13.2 g/dL (ref 13.0–17.0)
Immature Granulocytes: 1 %
Lymphocytes Relative: 32 %
Lymphs Abs: 2.4 10*3/uL (ref 0.7–4.0)
MCH: 30 pg (ref 26.0–34.0)
MCHC: 32.7 g/dL (ref 30.0–36.0)
MCV: 91.8 fL (ref 80.0–100.0)
Monocytes Absolute: 0.6 10*3/uL (ref 0.1–1.0)
Monocytes Relative: 7 %
Neutro Abs: 4.4 10*3/uL (ref 1.7–7.7)
Neutrophils Relative %: 58 %
Platelets: 202 10*3/uL (ref 150–400)
RBC: 4.4 MIL/uL (ref 4.22–5.81)
RDW: 13.4 % (ref 11.5–15.5)
WBC: 7.5 10*3/uL (ref 4.0–10.5)
nRBC: 0 % (ref 0.0–0.2)

## 2019-06-09 LAB — BASIC METABOLIC PANEL
Anion gap: 6 (ref 5–15)
BUN: 12 mg/dL (ref 8–23)
CO2: 31 mmol/L (ref 22–32)
Calcium: 9 mg/dL (ref 8.9–10.3)
Chloride: 105 mmol/L (ref 98–111)
Creatinine, Ser: 1.06 mg/dL (ref 0.61–1.24)
GFR calc Af Amer: >60 mL/min (ref >60)
GFR calc non Af Amer: >60 mL/min (ref >60)
Glucose, Bld: 94 mg/dL (ref 70–99)
Potassium: 4 mmol/L (ref 3.5–5.1)
Sodium: 142 mmol/L (ref 135–145)

## 2019-06-09 LAB — TROPONIN I (HIGH SENSITIVITY)
Troponin I (High Sensitivity): 835 ng/L (ref <18)
Troponin I (High Sensitivity): 888 ng/L (ref <18)
Troponin I (High Sensitivity): 494 ng/L (ref <18)
Troponin I (High Sensitivity): 411 ng/L (ref <18)
Troponin I (High Sensitivity): 330 ng/L (ref <18)

## 2019-06-09 LAB — HEPARIN LEVEL (UNFRACTIONATED)
Heparin Unfractionated: 0.38 IU/mL (ref 0.30–0.70)
Heparin Unfractionated: 0.43 IU/mL (ref 0.30–0.70)

## 2019-06-09 LAB — HIV ANTIBODY (ROUTINE TESTING W REFLEX): HIV Screen 4th Generation wRfx: NONREACTIVE

## 2019-06-09 LAB — ECHOCARDIOGRAM COMPLETE
Height: 64 in
Weight: 2000 oz

## 2019-06-09 LAB — PROTIME-INR
INR: 1 (ref 0.8–1.2)
Prothrombin Time: 12.5 seconds (ref 11.4–15.2)

## 2019-06-09 LAB — SARS CORONAVIRUS 2 BY RT PCR (HOSPITAL ORDER, PERFORMED IN ~~LOC~~ HOSPITAL LAB): SARS Coronavirus 2: NEGATIVE

## 2019-06-09 LAB — VALPROIC ACID LEVEL: Valproic Acid Lvl: 36 ug/mL — ABNORMAL LOW (ref 50.0–100.0)

## 2019-06-09 LAB — APTT: aPTT: 30 seconds (ref 24–36)

## 2019-06-09 MED ORDER — CLOPIDOGREL BISULFATE 75 MG PO TABS
300.0000 mg | ORAL_TABLET | Freq: Once | ORAL | Status: AC
  Administered 2019-06-09: 300 mg via ORAL
  Filled 2019-06-09: qty 4

## 2019-06-09 MED ORDER — IPRATROPIUM-ALBUTEROL 0.5-2.5 (3) MG/3ML IN SOLN
3.0000 mL | RESPIRATORY_TRACT | Status: DC
  Administered 2019-06-09 – 2019-06-10 (×7): 3 mL via RESPIRATORY_TRACT
  Filled 2019-06-09 (×6): qty 3

## 2019-06-09 MED ORDER — LORAZEPAM 1 MG PO TABS
1.0000 mg | ORAL_TABLET | Freq: Four times a day (QID) | ORAL | Status: DC | PRN
  Administered 2019-06-10: 1 mg via ORAL
  Filled 2019-06-09: qty 1

## 2019-06-09 MED ORDER — ACETAMINOPHEN 325 MG PO TABS: 650.0000 mg | ORAL_TABLET | Freq: Four times a day (QID) | ORAL | Status: DC | PRN

## 2019-06-09 MED ORDER — ALBUTEROL SULFATE (2.5 MG/3ML) 0.083% IN NEBU: 2.5000 mg | INHALATION_SOLUTION | RESPIRATORY_TRACT | Status: DC | PRN

## 2019-06-09 MED ORDER — ASPIRIN 81 MG PO CHEW: 324.0000 mg | CHEWABLE_TABLET | Freq: Every day | ORAL | Status: DC

## 2019-06-09 MED ORDER — SODIUM CHLORIDE 0.9 % IV SOLN: INTRAVENOUS | Status: DC

## 2019-06-09 MED ORDER — MORPHINE SULFATE (PF) 2 MG/ML IV SOLN
1.0000 mg | INTRAVENOUS | Status: DC | PRN
  Administered 2019-06-09 – 2019-06-11 (×3): 1 mg via INTRAVENOUS
  Filled 2019-06-09 (×3): qty 1

## 2019-06-09 MED ORDER — HEPARIN SODIUM (PORCINE) 5000 UNIT/ML IJ SOLN
60.0000 [IU]/kg | Freq: Once | INTRAMUSCULAR | Status: AC
  Administered 2019-06-09: 3400 [IU] via INTRAVENOUS

## 2019-06-09 MED ORDER — ONDANSETRON HCL 4 MG/2ML IJ SOLN: 4.0000 mg | Freq: Three times a day (TID) | INTRAMUSCULAR | Status: DC | PRN

## 2019-06-09 MED ORDER — AMLODIPINE BESYLATE 5 MG PO TABS
5.0000 mg | ORAL_TABLET | Freq: Every day | ORAL | Status: DC
  Administered 2019-06-09 – 2019-06-11 (×3): 5 mg via ORAL
  Filled 2019-06-09 (×3): qty 1

## 2019-06-09 MED ORDER — CLOPIDOGREL BISULFATE 75 MG PO TABS
75.0000 mg | ORAL_TABLET | Freq: Every day | ORAL | Status: DC
  Administered 2019-06-10 – 2019-06-12 (×3): 75 mg via ORAL
  Filled 2019-06-09 (×3): qty 1

## 2019-06-09 MED ORDER — IPRATROPIUM-ALBUTEROL 0.5-2.5 (3) MG/3ML IN SOLN
3.0000 mL | Freq: Once | RESPIRATORY_TRACT | Status: AC
  Administered 2019-06-09: 3 mL via RESPIRATORY_TRACT
  Filled 2019-06-09: qty 3

## 2019-06-09 MED ORDER — NITROGLYCERIN 0.4 MG SL SUBL: 0.4000 mg | SUBLINGUAL_TABLET | SUBLINGUAL | Status: DC | PRN

## 2019-06-09 MED ORDER — HEPARIN (PORCINE) 25000 UT/250ML-% IV SOLN
1000.0000 [IU]/h | INTRAVENOUS | Status: DC
  Administered 2019-06-09 – 2019-06-10 (×2): 800 [IU]/h via INTRAVENOUS
  Filled 2019-06-09 (×2): qty 250

## 2019-06-09 MED ORDER — HYDRALAZINE HCL 20 MG/ML IJ SOLN: 5.0000 mg | INTRAMUSCULAR | Status: DC | PRN

## 2019-06-09 MED ORDER — HYDROXYZINE HCL 25 MG PO TABS: 50.0000 mg | ORAL_TABLET | Freq: Four times a day (QID) | ORAL | Status: DC | PRN

## 2019-06-09 MED ORDER — TRAZODONE HCL 100 MG PO TABS
300.0000 mg | ORAL_TABLET | Freq: Every day | ORAL | Status: DC
  Administered 2019-06-09 – 2019-06-11 (×3): 300 mg via ORAL
  Filled 2019-06-09 (×3): qty 3

## 2019-06-09 MED ORDER — BENAZEPRIL HCL 20 MG PO TABS
20.0000 mg | ORAL_TABLET | Freq: Every day | ORAL | Status: DC
  Administered 2019-06-09 – 2019-06-12 (×4): 20 mg via ORAL
  Filled 2019-06-09 (×4): qty 1

## 2019-06-09 MED ORDER — METHYLPREDNISOLONE SODIUM SUCC 40 MG IJ SOLR
40.0000 mg | Freq: Two times a day (BID) | INTRAMUSCULAR | Status: DC
  Administered 2019-06-10 – 2019-06-12 (×5): 40 mg via INTRAVENOUS
  Filled 2019-06-09 (×5): qty 1

## 2019-06-09 MED ORDER — DIVALPROEX SODIUM ER 250 MG PO TB24
500.0000 mg | ORAL_TABLET | Freq: Every day | ORAL | Status: DC
  Administered 2019-06-09 – 2019-06-11 (×3): 500 mg via ORAL
  Filled 2019-06-09 (×4): qty 2

## 2019-06-09 MED ORDER — HALOPERIDOL 5 MG PO TABS
2.5000 mg | ORAL_TABLET | Freq: Every day | ORAL | Status: DC
  Administered 2019-06-09 – 2019-06-12 (×4): 2.5 mg via ORAL
  Filled 2019-06-09 (×4): qty 0.5

## 2019-06-09 MED ORDER — DONEPEZIL HCL 5 MG PO TABS
5.0000 mg | ORAL_TABLET | Freq: Every day | ORAL | Status: DC
  Administered 2019-06-09 – 2019-06-11 (×3): 5 mg via ORAL
  Filled 2019-06-09 (×4): qty 1

## 2019-06-09 MED ORDER — ASPIRIN EC 81 MG PO TBEC
81.0000 mg | DELAYED_RELEASE_TABLET | Freq: Every day | ORAL | Status: DC
  Administered 2019-06-10 – 2019-06-12 (×3): 81 mg via ORAL
  Filled 2019-06-09 (×3): qty 1

## 2019-06-09 MED ORDER — VITAMIN B-12 1000 MCG PO TABS
1000.0000 ug | ORAL_TABLET | Freq: Every day | ORAL | Status: DC
  Administered 2019-06-09 – 2019-06-12 (×4): 1000 ug via ORAL
  Filled 2019-06-09 (×4): qty 1

## 2019-06-09 MED ORDER — DM-GUAIFENESIN ER 30-600 MG PO TB12
1.0000 | ORAL_TABLET | Freq: Two times a day (BID) | ORAL | Status: DC | PRN
  Administered 2019-06-09: 1 via ORAL
  Filled 2019-06-09: qty 1

## 2019-06-09 MED ORDER — AMLODIPINE BESY-BENAZEPRIL HCL 5-20 MG PO CAPS: 1.0000 | ORAL_CAPSULE | Freq: Every day | ORAL | Status: DC

## 2019-06-09 MED ORDER — TRIHEXYPHENIDYL HCL 5 MG PO TABS
5.0000 mg | ORAL_TABLET | Freq: Three times a day (TID) | ORAL | Status: DC
  Administered 2019-06-09 – 2019-06-12 (×9): 5 mg via ORAL
  Filled 2019-06-09 (×12): qty 1

## 2019-06-09 MED ORDER — QUETIAPINE FUMARATE 300 MG PO TABS
800.0000 mg | ORAL_TABLET | Freq: Every day | ORAL | Status: DC
  Administered 2019-06-09 – 2019-06-11 (×3): 800 mg via ORAL
  Filled 2019-06-09 (×4): qty 1

## 2019-06-09 MED ORDER — METHYLPREDNISOLONE SODIUM SUCC 125 MG IJ SOLR
125.0000 mg | Freq: Once | INTRAMUSCULAR | Status: AC
  Administered 2019-06-09: 125 mg via INTRAVENOUS
  Filled 2019-06-09: qty 2

## 2019-06-09 MED ORDER — ATORVASTATIN CALCIUM 20 MG PO TABS
40.0000 mg | ORAL_TABLET | Freq: Every day | ORAL | Status: DC
  Administered 2019-06-09 – 2019-06-12 (×4): 40 mg via ORAL
  Filled 2019-06-09 (×4): qty 2

## 2019-06-09 MED ORDER — ASPIRIN 81 MG PO CHEW
324.0000 mg | CHEWABLE_TABLET | Freq: Once | ORAL | Status: AC
  Administered 2019-06-09: 324 mg via ORAL
  Filled 2019-06-09: qty 4

## 2019-06-09 NOTE — H&P (Addendum)
History and Physical    Carlos Nelson [id number] DOB: 10/25/1950 DOA: 06/09/2019  Referring MD/NP/PA:   PCP: Jodi Marble, MD   Patient coming from:  The patient is coming from SNF.  At baseline, pt is dependent for most of ADL.        Chief Complaint: SOB and chest pain  HPI: Carlos Nelson is a 69 y.o. male with medical history significant of hypertension, COPD, schizophrenia, non-STEMI, CAD, depression with anxiety, who presents with shortness breath and chest pain.  Patient states that he has been having shortness breath in the past 2 days, which has worsened today.  He has dry cough, no fever or chills.  Patient also has chest pain, which is located in the substernal area, dull, 6 out of 10 severity, radiating to the right arm.  He has nausea and vomited once yesterday, currently no nausea, vomiting, diarrhea or abdominal pain.  Denies symptoms of UTI or unilateral weakness.  Denies recent fall or head injury.  No dark stool.   ED Course: pt was found to have troponin VIII 35, 888, INR 1.0, PTT 30, negative Covid PCR, electrolytes renal function okay, temperature normal, blood pressure 145/90, heart rate 87, RR 17, oxygen saturation 89% on room air, which improved to 92 to 96% on 2 L nasal cannula oxygen.  Chest x-ray negative.  Patient is admitted to progressive bed as inpatient.  Cardiology, Dr. Saunders Revel is consulted.  Review of Systems:   General: no fevers, chills, no body weight gain, has fatigue HEENT: no blurry vision, hearing changes or sore throat Respiratory: has dyspnea, coughing, wheezing CV: has chest pain, no palpitations GI: no nausea, vomiting, abdominal pain, diarrhea, constipation GU: no dysuria, burning on urination, increased urinary frequency, hematuria  Ext: no leg edema Neuro: no unilateral weakness, numbness, or tingling, no vision change or hearing loss Skin: no rash, no skin tear. MSK: No muscle spasm, no deformity, no limitation of range of  movement in spin Heme: No easy bruising.  Travel history: No recent long distant travel.  Allergy:  Allergies  Allergen Reactions  . Ace Inhibitors Swelling    Ok with benazapril, per grouphome    Past Medical History:  Diagnosis Date  . Chronic respiratory failure with hypoxia ([place])   . COPD (chronic obstructive pulmonary disease) ([place])   . Hypertension   . Schizophrenia [facility])     Past Surgical History:  Procedure Laterality Date  . COLONOSCOPY    . COLONOSCOPY WITH PROPOFOL N/A 12/13/2016   Procedure: COLONOSCOPY WITH PROPOFOL;  Surgeon: Lollie Sails, MD;  Location: [facility] ENDOSCOPY;  Service: Endoscopy;  Laterality: N/A;  . ESOPHAGOGASTRODUODENOSCOPY (EGD) WITH PROPOFOL N/A 12/13/2016   Procedure: ESOPHAGOGASTRODUODENOSCOPY (EGD) WITH PROPOFOL;  Surgeon: Lollie Sails, MD;  Location: [facility] ENDOSCOPY;  Service: Endoscopy;  Laterality: N/A;  . left arm surgery      Social History:  reports that he has quit smoking. His smoking use included cigarettes. He smoked 1.00 pack per day. He has never used smokeless tobacco. He reports current alcohol use. He reports current drug use. Drug: Marijuana.  Family History:  Family History  Problem Relation Age of Onset  . Prostate cancer Neg Hx   . Bladder Cancer Neg Hx      Prior to Admission medications   Medication Sig Start Date End Date Taking? Authorizing Provider  ARIPiprazole ER (ABILIFY MAINTENA) 400 MG PRSY prefilled syringe Inject 400 mg into the muscle every 30 (thirty) days.   Yes  [provider]  divalproex (DEPAKOTE ER) 250 MG 24 hr tablet Take 500 mg by mouth at bedtime.   Yes [provider]  hydrOXYzine (ATARAX/VISTARIL) 50 MG tablet Take 50 mg by mouth every 6 (six) hours as needed for anxiety or itching.   Yes [provider]  QUEtiapine (SEROQUEL) 400 MG tablet Take 800 mg by mouth at bedtime.   Yes [provider]  traZODone (DESYREL) 150 MG tablet Take 150 mg by mouth 2  (two) times daily.    Yes [provider]  albuterol (PROVENTIL) (2.5 MG/3ML) 0.083% nebulizer solution Take 2.5 mg by nebulization every 6 (six) hours as needed for wheezing or shortness of breath.    [provider]  amLODipine-benazepril (LOTREL) 5-20 MG capsule Take 1 capsule by mouth daily.    [provider]  dextromethorphan-guaiFENesin (MUCINEX DM) 30-600 MG 12hr tablet Take 1 tablet by mouth 2 (two) times daily. 05/22/19   Lorella Nimrod, MD  donepezil (ARICEPT) 5 MG tablet Take 5 mg by mouth at bedtime.    [provider]  haloperidol (HALDOL) 5 MG tablet Take 2.5 mg by mouth daily.    [provider]  Ipratropium-Albuterol (COMBIVENT RESPIMAT) 20-100 MCG/ACT AERS respimat Inhale 1 puff into the lungs every 6 (six) hours as needed for wheezing or shortness of breath.     [provider]  LORazepam (ATIVAN) 1 MG tablet Take 1 tablet (1 mg total) by mouth every 6 (six) hours as needed for anxiety. 08/22/18   Nicholes Mango, MD  trihexyphenidyl (ARTANE) 5 MG tablet Take 1 tablet (5 mg total) by mouth 3 (three) times daily with meals. 05/22/19   Lorella Nimrod, MD  vitamin B-12 (CYANOCOBALAMIN) 1000 MCG tablet Take 1,000 mcg by mouth daily.    [provider]    Physical Exam: Vitals:   06/09/19 0830 06/09/19 0900 06/09/19 1000 06/09/19 1030  BP: (!) 145/87 109/81 (!) 154/89 (!) 142/86  Pulse: 90 86 81 87  Resp: 16 20 19 13   Temp:      TempSrc:      SpO2: 97% 97% 96% 94%  Weight:      Height:       General: Not in acute distress HEENT:       Eyes: PERRL, EOMI, no scleral icterus.       ENT: No discharge from the ears and nose, no pharynx injection, no tonsillar enlargement.        Neck: No JVD, no bruit, no mass felt. Heme: No neck lymph node enlargement. Cardiac: S1/S2, RRR, No murmurs, No gallops or rubs. Respiratory: Has wheezing bilaterally. GI: Soft, nondistended, nontender, no rebound pain, no organomegaly, BS  present. GU: No hematuria Ext: No pitting leg edema bilaterally. 2+DP/PT pulse bilaterally. Musculoskeletal: No joint deformities, No joint redness or warmth, no limitation of ROM in spin. Skin: No rashes.  Neuro: Alert, oriented X3, cranial nerves II-XII grossly intact, moves all extremities normally.  Psych: Patient is not psychotic, no suicidal or hemocidal ideation.  Labs on Admission: I have personally reviewed following labs and imaging studies  CBC: Recent Labs  Lab 06/09/19 0307  WBC 7.5  NEUTROABS 4.4  HGB 13.2  HCT 40.4  MCV 91.8  PLT 123XX123   Basic Metabolic Panel: Recent Labs  Lab 06/09/19 0307  NA 142  K 4.0  CL 105  CO2 31  GLUCOSE 94  BUN 12  CREATININE 1.06  CALCIUM 9.0   GFR: Estimated Creatinine Clearance: 52 mL/min (by  C-G formula based on SCr of 1.06 mg/dL). Liver Function Tests: No results for input(s): AST, ALT, ALKPHOS, BILITOT, PROT, ALBUMIN in the last 168 hours. No results for input(s): LIPASE, AMYLASE in the last 168 hours. No results for input(s): AMMONIA in the last 168 hours. Coagulation Profile: Recent Labs  Lab 06/09/19 0309  INR 1.0   Cardiac Enzymes: No results for input(s): CKTOTAL, CKMB, CKMBINDEX, TROPONINI in the last 168 hours. BNP (last 3 results) No results for input(s): PROBNP in the last 8760 hours. HbA1C: No results for input(s): HGBA1C in the last 72 hours. CBG: No results for input(s): GLUCAP in the last 168 hours. Lipid Profile: No results for input(s): CHOL, HDL, LDLCALC, TRIG, CHOLHDL, LDLDIRECT in the last 72 hours. Thyroid Function Tests: No results for input(s): TSH, T4TOTAL, FREET4, T3FREE, THYROIDAB in the last 72 hours. Anemia Panel: No results for input(s): VITAMINB12, FOLATE, FERRITIN, TIBC, IRON, RETICCTPCT in the last 72 hours. Urine analysis:    Component Value Date/Time   COLORURINE STRAW (A) 11/10/2018 1451   APPEARANCEUR Clear 05/29/2019 1426   LABSPEC 1.010 11/10/2018 1451   LABSPEC 1.014  11/09/2013 1525   PHURINE 7.0 11/10/2018 1451   GLUCOSEU Negative 05/29/2019 1426   GLUCOSEU Negative 11/09/2013 1525   HGBUR NEGATIVE 11/10/2018 1451   BILIRUBINUR Negative 05/29/2019 1426   BILIRUBINUR Negative 11/09/2013 [place] 11/10/2018 1451   PROTEINUR Negative 05/29/2019 1426   PROTEINUR NEGATIVE 11/10/2018 1451   NITRITE Negative 05/29/2019 1426   NITRITE NEGATIVE 11/10/2018 1451   LEUKOCYTESUR Negative 05/29/2019 1426   LEUKOCYTESUR NEGATIVE 11/10/2018 1451   LEUKOCYTESUR Negative 11/09/2013 1525   Sepsis Labs: @LABRCNTIP (procalcitonin:4,lacticidven:4) ) Recent Results (from the past 240 hour(s))  SARS Coronavirus 2 by RT PCR (hospital order, performed in [place] hospital lab) Nasopharyngeal Nasopharyngeal Swab     Status: None   Collection Time: 06/09/19  3:08 AM   Specimen: Nasopharyngeal Swab  Result Value Ref Range Status   SARS Coronavirus 2 NEGATIVE NEGATIVE Final    Comment: (NOTE) SARS-CoV-2 target nucleic acids are NOT DETECTED. The SARS-CoV-2 RNA is generally detectable in upper and lower respiratory specimens during the acute phase of infection. The lowest concentration of SARS-CoV-2 viral copies this assay can detect is 250 copies / mL. A negative result does not preclude SARS-CoV-2 infection and should not be used as the sole basis for treatment or other patient management decisions.  A negative result may occur with improper specimen collection / handling, submission of specimen other than nasopharyngeal swab, presence of viral mutation(s) within the areas targeted by this assay, and inadequate number of viral copies (<250 copies / mL). A negative result must be combined with clinical observations, patient history, and epidemiological information. Fact Sheet for Patients:   StrictlyIdeas.no Fact Sheet for Healthcare Providers: BankingDealers.co.za This test is not yet approved or  cleared  by the [place] FDA and has been authorized for detection and/or diagnosis of SARS-CoV-2 by FDA under an Emergency Use Authorization (EUA).  This EUA will remain in effect (meaning this test can be used) for the duration of the COVID-19 declaration under Section 564(b)(1) of the Act, 21 U.S.C. section 360bbb-3(b)(1), unless the authorization is terminated or revoked sooner. Performed at [facility], [address] [place],  [address]      Radiological Exams on Admission: DG Chest [facility] 1 View  Result Date: 06/09/2019 CLINICAL DATA:  Shortness of breath EXAM: PORTABLE CHEST 1 VIEW COMPARISON:  Radiograph 05/23/2019 FINDINGS: No consolidation,  features of edema, pneumothorax, or effusion. Pulmonary vascularity is normally distributed. The cardiomediastinal contours are unremarkable. No acute osseous or soft tissue abnormality. Degenerative changes are present in the imaged spine and shoulders. Telemetry leads overlie the chest. IMPRESSION: No acute cardiopulmonary abnormality. Electronically Signed   By: Lovena Le M.D.   On: 06/09/2019 03:41     EKG: Independently reviewed.  Sinus rhythm, QTC 486, low voltage, RAD, nonspecific T wave change   Assessment/Plan Principal Problem:   COPD exacerbation (HCC) Active Problems:   Schizophrenia ([place])   Hypertension   Acute on chronic respiratory failure with hypoxia (HCC)   NSTEMI (non-ST elevated myocardial infarction) ([place])   Depression with anxiety   Acute on chronic respiratory failure with hypoxia due to COPD exacerbation ([place]):  - will admit to progressive unit as inpatient -Bronchodilators -Solu-Medrol 40 mg IV tid -Mucinex for cough  -Follow up sputum culture, respiratory virus panel, Flu pcr -Nasal cannula oxygen as needed to maintain O2 saturation 93% or greater  NSTEMI (non-ST elevated myocardial infarction): pt has chest pain and elevated trop 835 -->888. Card, Dr. Saunders Revel is consulted. -  Trend Trop - Repeat EKG in the am  - prn Nitroglycerin, Morphine - start aspirin, lipitor 40 mg daily - Risk factor stratification: will check FLP and A1C  - check UDS - f/u card recommendations  HTN:  -Continue home medications: Lotrel -hydralazine prn  Schizophrenia and Depression with anxiety: pt is calm now. No SI or HI. -Continue home medications: Depakote, hydroxyzine, Ativan, [place], trazodone, trihexyphenidyl -pt is on Aripiprazole IM injection monthly at home    DVT ppx: on IV Heparin    Code Status: Full code Family Communication: yes, his sister by phone Disposition Plan:  Anticipate discharge back to previous SNF environment Consults called:  Dr. Saunders Revel of card Admission status: progressive unit as inpt     Status is: Inpatient  Remains inpatient appropriate because:Inpatient level of care appropriate due to severity of illness.  Patient has multiple comorbidities, now presents with COPD exacerbation with acute on chronic respiratory failure, non-STEMI with elevated troponin.  His presentation is highly complicated.  Patient is at high risk of deterioration.  Need to be treated in hospital for at least 2 days.   Dispo: The patient is from: SNF              Anticipated d/c is to: SNF              Anticipated d/c date is: 2 days              Patient currently is not medically stable to d/c.           Date of Service 06/09/2019    Ivor Costa [facility]   If 7PM-7AM, please contact night-coverage [email or web address] 06/09/2019, 11:21 AM

## 2019-06-09 NOTE — ED Notes (Signed)
Dr Beather Arbour made aware at this time of pt's critically elevated Troponin level as reported by lab. Troponin 835 ng/L

## 2019-06-09 NOTE — Progress Notes (Signed)
ANTICOAGULATION CONSULT NOTE - Initial Consult  Pharmacy Consult for Heparin Indication: chest pain/ACS  Allergies  Allergen Reactions  . Ace Inhibitors Swelling    Ok with benazapril, per grouphome    Patient Measurements: Height: 5\' 4"  (162.6 cm) Weight: 56.7 kg (125 lb) IBW/kg (Calculated) : 59.2 HEPARIN DW (KG): 56.7  Vital Signs: Temp: 98.5 F (36.9 C) (05/25 1119) Temp Source: Oral (05/25 1119) BP: 132/97 (05/25 1119) Pulse Rate: 85 (05/25 1119)  Labs: Recent Labs    06/09/19 0307 06/09/19 0307 06/09/19 0309 06/09/19 0508 06/09/19 0842 06/09/19 1155  HGB 13.2  --   --   --   --   --   HCT 40.4  --   --   --   --   --   PLT 202  --   --   --   --   --   APTT  --   --  30  --   --   --   LABPROT  --   --  12.5  --   --   --   INR  --   --  1.0  --   --   --   HEPARINUNFRC  --   --   --   --   --  0.38  CREATININE 1.06  --   --   --   --   --   TROPONINIHS 835*   < >  --  888* 494* 411*   < > = values in this interval not displayed.    Estimated Creatinine Clearance: 52 mL/min (by C-G formula based on SCr of 1.06 mg/dL).   Medical History: Past Medical History:  Diagnosis Date  . Chronic respiratory failure with hypoxia ([place])   . COPD (chronic obstructive pulmonary disease) ([place])   . Hypertension   . Schizophrenia ([place])     Medications:  Medications Prior to Admission  Medication Sig Dispense Refill Last Dose  . albuterol (PROVENTIL) (2.5 MG/3ML) 0.083% nebulizer solution Take 2.5 mg by nebulization every 6 (six) hours as needed for wheezing or shortness of breath.   Unknown at PRN  . albuterol (VENTOLIN HFA) 108 (90 Base) MCG/ACT inhaler Inhale 1-2 puffs into the lungs every 6 (six) hours as needed for wheezing or shortness of breath.   Unknown at PRN  . amLODipine-benazepril (LOTREL) 5-20 MG capsule Take 1 capsule by mouth daily.   06/08/2019 at 0800  . ARIPiprazole ER (ABILIFY MAINTENA) 400 MG PRSY prefilled syringe Inject 400 mg into the muscle  every 30 (thirty) days.   Unknown at Unknown  . divalproex (DEPAKOTE ER) 250 MG 24 hr tablet Take 500 mg by mouth at bedtime.   06/08/2019 at 2000  . donepezil (ARICEPT) 5 MG tablet Take 5 mg by mouth at bedtime.   06/08/2019 at 2000  . haloperidol (HALDOL) 5 MG tablet Take 2.5 mg by mouth daily.   06/08/2019 at 0800  . hydrOXYzine (ATARAX/VISTARIL) 50 MG tablet Take 50 mg by mouth every 6 (six) hours as needed for anxiety or itching.   PRN at PRN  . LORazepam (ATIVAN) 1 MG tablet Take 1 tablet (1 mg total) by mouth every 6 (six) hours as needed for anxiety. 15 tablet 0 Unknown at PRN  . QUEtiapine (SEROQUEL) 400 MG tablet Take 800 mg by mouth at bedtime.   06/08/2019 at 2000  . traZODone (DESYREL) 150 MG tablet Take 300 mg by mouth at bedtime.    06/08/2019 at 2000  .  trihexyphenidyl (ARTANE) 5 MG tablet Take 1 tablet (5 mg total) by mouth 3 (three) times daily with meals. 90 tablet 0 06/08/2019 at 1700  . vitamin B-12 (CYANOCOBALAMIN) 1000 MCG tablet Take 1,000 mcg by mouth daily.       Assessment: Baseline labs ordered.  No anticoagulants per PTA med list.  Heparin initially ordered by EDP, pharmacy asked to follow and manage.  5/25 1155 HL 0.38   Goal of Therapy:  Heparin level 0.3-0.7 units/ml Monitor platelets by anticoagulation protocol: Yes   Plan:  Heparin level is therapeutic. Will continue current rate. Will check heparin level in 8 hours. CBC with AM labs while on heparin. Plan to continue heparin for 48 hours.    Oswald Hillock, PharmD, BCPS 06/09/2019,1:34 PM

## 2019-06-09 NOTE — Progress Notes (Signed)
Sent patient belonging down with security/ in safe.  Belongings included money ($56.18 dollars & coins), a lighter and a pack of cigarettes.  2nd nurse verification obtain with money

## 2019-06-09 NOTE — ED Triage Notes (Signed)
Pt arrives to ED via [facility] from [place] with c/o [facility] x2 days with worsening tonight. EMS states pt's initial O2 sats were 89% on their arrival; improved to 99% on RA after 2 DouNeb t/x's given by EMS en route to ED. Pt arrives with audible wheezing; denies any c/o CP. Pt is A&O, in NAD; RR even, regular, and unlabored. Dr Beather Arbour at bedside upon pt's arrival to ED.

## 2019-06-09 NOTE — ED Notes (Signed)
Echocardiogram being performed at bedside. Will transport to floor when completed.

## 2019-06-09 NOTE — ED Notes (Signed)
Pt placed on 2L O2 via  at this time d/t sats regularly dropping into the 88-92% range on RA; pt's O2 sats improved to 94-95% once supplemental O2 applied.

## 2019-06-09 NOTE — Consult Note (Signed)
Cardiology Consultation:   Patient ID: [name]; [id number]; December 30, 1950   Admit date: 06/09/2019 Date of Consult: 06/09/2019  Primary Care Provider: Jodi Marble, MD Primary Cardiologist: New to [facility] - consult by End   Patient Profile:   [facility]os [facility] is a 69 y.o. male with a hx of schizophrenia, COPD, anxiety, and HTN who is being seen today for the evaluation of elevated high-sensitivity troponin at the request of Dr. Blaine [facility].  History of Present Illness:   [facility] has no previously known cardiac history.  He has recently been admitted to the [facility] twice with COPD exacerbations just this month alone.  He has had several ED visits for schizophrenia/anxiety/atypical chest pain.  He reports an approximate 5 to 30-month history of intermittent dyspnea with cough productive of clear sputum.  He reports he was in his baseline usual state of health throughout the day on 5/24 and denied any chest pain or dyspnea.  He got up around 1:52 this morning to walk around in his room and noted increased shortness of breath with associated chest tightness/discomfort.  He also reports some mild lower extremity swelling.  No abdominal distention, orthopnea, PND, or early satiety.  No palpitations, or diaphoresis.  He does note some nausea without emesis.  He was worried he had contracted COVID-19.  He contacted EMS and was brought to the [facility] ED.  Upon the patient's arrival to [facility] they were found to have BP in the [id number] to 123456 systolic, HR 123XX123 to 0000000 bpm, temp afebrile, oxygen saturation 92% on room air requiring supplemental oxygen via nasal cannula at 2 L, weight documented at 56.7 kg. EKG showed sinus tachycardia with pulmonary disease pattern as outlined below, CXR showed no acute cardiopulmonary process. Labs showed an initial high-sensitivity troponin of 835 with a delta of 888, potassium 4.0, BUN 12, serum creatinine 1.06, unremarkable CBC, Covid negative, urine drug screen positive  for tricyclic antidepressants.  In the ED, he was given ASA 324 mg x 1 along with DuoNeb, and IV steroids.  He does note improvement in his chest discomfort though continues to note some shortness of breath.  Past Medical History:  Diagnosis Date   Chronic respiratory failure with hypoxia (HCC)    COPD (chronic obstructive pulmonary disease) ([place])    Hypertension    Schizophrenia ([place])     Past Surgical History:  Procedure Laterality Date   COLONOSCOPY     COLONOSCOPY WITH PROPOFOL N/A 12/13/2016   Procedure: COLONOSCOPY WITH PROPOFOL;  Surgeon: Carlos Sails, MD;  Location[facility] ENDOSCOPY;  Service: Endoscopy;  Laterality: N/A;   ESOPHAGOGASTRODUODENOSCOPY (EGD) WITH PROPOFOL N/A 12/13/2016   Procedure: ESOPHAGOGASTRODUODENOSCOPY (EGD) WITH PROPOFOL;  Surgeon: Carlos Sails, MD;  Location: [facility] ENDOSCOPY;  Service: Endoscopy;  Laterality: N/A;   left arm surgery       Home Meds: Prior to Admission medications   Medication Sig Start Date End Date Taking? Authorizing Provider  ARIPiprazole ER (ABILIFY MAINTENA) 400 MG PRSY prefilled syringe Inject 400 mg into the muscle every 30 (thirty) days.   Yes [provider]  divalproex (DEPAKOTE ER) 250 MG 24 hr tablet Take 500 mg by mouth at bedtime.   Yes [provider]  hydrOXYzine (ATARAX/VISTARIL) 50 MG tablet Take 50 mg by mouth every 6 (six) hours as needed for anxiety or itching.   Yes [provider]  QUEtiapine (SEROQUEL) 400 MG tablet Take 800 mg by mouth at bedtime.   Yes [provider]  traZODone (DESYREL) 150 MG tablet Take 150 mg by mouth 2 (two) times daily.    Yes [provider]  vitamin B-12 (CYANOCOBALAMIN) 1000 MCG tablet Take 1,000 mcg by mouth daily.   Yes [provider]  albuterol (PROVENTIL) (2.5 MG/3ML) 0.083% nebulizer solution Take 2.5 mg by nebulization every 6 (six) hours as needed for wheezing or shortness of breath.    [provider]  amLODipine-benazepril (LOTREL) 5-20 MG capsule Take 1 capsule by mouth daily.    [provider]  donepezil (ARICEPT) 5 MG tablet Take 5 mg by mouth at bedtime.    [provider]  haloperidol (HALDOL) 5 MG tablet Take 2.5 mg by mouth daily.    [provider]  Ipratropium-Albuterol (COMBIVENT RESPIMAT) 20-100 MCG/ACT AERS respimat Inhale 1 puff into the lungs every 6 (six) hours as needed for wheezing or shortness of breath.     [provider]  LORazepam (ATIVAN) 1 MG tablet Take 1 tablet (1 mg total) by mouth every 6 (six) hours as needed for anxiety. 08/22/18   Carlos Mango, MD  trihexyphenidyl (ARTANE) 5 MG tablet Take 1 tablet (5 mg total) by mouth 3 (three) times daily with meals. 05/22/19   Carlos Nimrod, MD    Inpatient Medications: Scheduled Meds:  [START ON 06/10/2019] aspirin  324 mg Oral Daily   atorvastatin  40 mg Oral Daily   ipratropium-albuterol  3 mL Nebulization Q4H   [START ON 06/10/2019] methylPREDNISolone (SOLU-MEDROL) injection  40 mg Intravenous Q12H   Continuous Infusions:  heparin 800 Units/hr (06/09/19 0409)   PRN Meds: albuterol, dextromethorphan-guaiFENesin, LORazepam, morphine injection, nitroGLYCERIN  Allergies:   Allergies  Allergen Reactions   Ace Inhibitors Swelling    Ok with benazapril, per grouphome    Social History:   Social History   Socioeconomic History   Marital status: Single    Spouse name: Not on file   Number of children: Not on file   Years of education: Not on file   Highest education level: Not on file  Occupational History   Not on file  Tobacco Use   Smoking status: Former Smoker    Packs/day: 1.00    Types: Cigarettes   Smokeless tobacco: Never Used  Substance and Sexual Activity   Alcohol use: Yes    Alcohol/week: 0.0 standard drinks    Comment: beer   Drug use: Yes    Types: Marijuana    Comment: marjuana   Sexual activity: Not on file  Other Topics Concern    Not on file  Social History Narrative   Not on file   Social Determinants of Health   Financial Resource Strain:    Difficulty of Paying Living Expenses:   Food Insecurity:    Worried About Charity fundraiser in the Last Year:    Arboriculturist in the Last Year:   Transportation Needs:    Film/video editor (Medical):    Lack of Transportation (Non-Medical):   Physical Activity:    Days of Exercise per Week:    Minutes of Exercise per Session:   Stress:    Feeling of Stress :   Social Connections:    Frequency of Communication with Friends and Family:    Frequency of Social Gatherings with Friends and Family:    Attends Religious Services:    Active Member of Clubs or Organizations:    Attends Archivist Meetings:    Marital Status:   Intimate  Partner Violence:    Fear of Current or Ex-Partner:    Emotionally Abused:    Physically Abused:    Sexually Abused:      Family History:   Family History  Problem Relation Age of Onset   Prostate cancer Neg Hx    Bladder Cancer Neg Hx     ROS:  Review of Systems  Constitutional: Positive for malaise/fatigue. Negative for chills, diaphoresis, fever and weight loss.  HENT: Negative for congestion.   Eyes: Negative for discharge and redness.  Respiratory: Positive for cough, shortness of breath and wheezing. Negative for hemoptysis and sputum production.   Cardiovascular: Positive for chest pain. Negative for palpitations, orthopnea, claudication, leg swelling and PND.  Gastrointestinal: Negative for abdominal pain, heartburn, nausea and vomiting.  Musculoskeletal: Negative for falls and myalgias.  Skin: Negative for rash.  Neurological: Positive for weakness. Negative for dizziness, tingling, tremors, sensory change, speech change, focal weakness and loss of consciousness.  Endo/Heme/Allergies: Does not bruise/bleed easily.  Psychiatric/Behavioral: Negative for substance abuse. The patient  is nervous/anxious.   All other systems reviewed and are negative.     Physical Exam/Data:   Vitals:   06/09/19 0442 06/09/19 0512 06/09/19 0604 06/09/19 0631  BP: 138/90 (!) 149/93 (!) 168/97 (!) 148/90  Pulse: 95 93 93 87  Resp: 17 18 17 17   Temp:      TempSrc:      SpO2: 96% 98% 96% 96%  Weight:      Height:        Intake/Output Summary (Last 24 hours) at 06/09/2019 0937 Last data filed at 06/09/2019 0916 Gross per 24 hour  Intake --  Output 450 ml  Net -450 ml   Filed Weights   06/09/19 0305  Weight: 56.7 kg   Body mass index is 21.46 kg/m.   Physical Exam: General: Well developed, well nourished, in no acute distress. Head: Normocephalic, atraumatic, sclera non-icteric, no xanthomas, nares without discharge.  Neck: Negative for carotid bruits. JVD not elevated. Lungs: Significant wheezing with minimal air movement without crackles or rales. Breathing is unlabored on supplemental oxygen via nasal cannula 2 L. Heart: RRR with S1 S2. No murmurs, rubs, or gallops appreciated. Abdomen: Soft, non-tender, non-distended with normoactive bowel sounds. No hepatomegaly. No rebound/guarding. No obvious abdominal masses. Msk:  Strength and tone appear normal for age. Extremities: No clubbing or cyanosis. No edema is noted on exam. Distal pedal pulses are 2+ and equal bilaterally. Neuro: Alert and oriented X 3. No facial asymmetry. No focal deficit. Moves all extremities spontaneously. Psych:  Responds to questions appropriately with a normal affect.   EKG:  The EKG was personally reviewed and demonstrates: Sinus tachycardia, 103 bpm, pulmonary disease pattern, nonspecific ST-T changes Telemetry:  Telemetry was personally reviewed and demonstrates: SR  Weights: [employer]   06/09/19 0305  Weight: 56.7 kg    Relevant CV Studies:  2D echo pending  Laboratory Data:  Chemistry Recent Labs  Lab 06/09/19 0307  NA 142  K 4.0  CL 105  CO2 31  GLUCOSE 94  BUN 12   CREATININE 1.06  CALCIUM 9.0  GFRNONAA >60  GFRAA >60  ANIONGAP 6    No results for input(s): PROT, ALBUMIN, AST, ALT, ALKPHOS, BILITOT in the last 168 hours. Hematology Recent Labs  Lab 06/09/19 0307  WBC 7.5  RBC 4.40  HGB 13.2  HCT 40.4  MCV 91.8  MCH 30.0  MCHC 32.7  RDW 13.4  PLT 202   Cardiac EnzymesNo  results for input(s): TROPONINI in the last 168 hours. No results for input(s): TROPIPOC in the last 168 hours.  BNPNo results for input(s): BNP, PROBNP in the last 168 hours.  DDimer No results for input(s): DDIMER in the last 168 hours.  Radiology/Studies:  [facility] Chest Port 1 View  Result Date: 06/09/2019 IMPRESSION: No acute cardiopulmonary abnormality. Electronically Signed   By: Lovena Le M.D.   On: 06/09/2019 03:41    Assessment and Plan:   1.  Demand ischemia: -Mildly elevated and flat trending high-sensitivity troponin consistent with supply demand ischemia in the setting of underlying severe COPD exacerbation -Patient has declined diagnostic LHC, showed symptoms persist despite treatment of his COPD exacerbation or worsen this will need to be revisited -Plan for ischemic evaluation prior to discharge based on patient preferences -Continue heparin drip for 48 hours -ASA -Echo pending -Check A1c and lipid panel for further risk stratification  2.  COPD exacerbation: -This appears to be the primary reason for his presentation with significant wheezing noted on exam -Management per internal medicine  3.  HTN: -Hydralazine   For questions or updates, please contact [place] Please consult [email or web address] for contact info under Cardiology/STEMI.   Signed, Christell Faith, PA-C [place] Pager: [phone] 06/09/2019, 9:37 AM

## 2019-06-09 NOTE — Progress Notes (Signed)
ANTICOAGULATION CONSULT NOTE - Follow up [place] for Heparin Indication: chest pain/ACS  Allergies  Allergen Reactions  . Ace Inhibitors Swelling    Ok with benazapril, per grouphome    Patient Measurements: Height: 5\' 4"  (162.6 cm) Weight: 56.7 kg (125 lb) IBW/kg (Calculated) : 59.2 HEPARIN DW (KG): 56.7  Vital Signs: Temp: 98.3 F (36.8 C) (05/25 1946) Temp Source: Oral (05/25 1946) BP: 148/85 (05/25 1946) Pulse Rate: 97 (05/25 1946)  Labs: Recent Labs    06/09/19 0307 06/09/19 0309 06/09/19 0508 06/09/19 0842 06/09/19 1155 06/09/19 1358 06/09/19 1951  HGB 13.2  --   --   --   --   --   --   HCT 40.4  --   --   --   --   --   --   PLT 202  --   --   --   --   --   --   APTT  --  30  --   --   --   --   --   LABPROT  --  12.5  --   --   --   --   --   INR  --  1.0  --   --   --   --   --   HEPARINUNFRC  --   --   --   --  0.38  --  0.43  CREATININE 1.06  --   --   --   --   --   --   TROPONINIHS 835*  --    < > 494* 411* 330*  --    < > = values in this interval not displayed.    Estimated Creatinine Clearance: 52 mL/min (by C-G formula based on SCr of 1.06 mg/dL).   Medical History: Past Medical History:  Diagnosis Date  . Chronic respiratory failure with hypoxia ([place])   . COPD (chronic obstructive pulmonary disease) ([place])   . Hypertension   . Schizophrenia ([place])     Assessment: Baseline labs ordered.  No anticoagulants per PTA med list.  Heparin initially ordered by EDP, pharmacy asked to follow and manage.  5/25 1155 HL 0.38  5/25 1951 HL 0.43  Goal of Therapy:  Heparin level 0.3-0.7 units/ml Monitor platelets by anticoagulation protocol: Yes   Plan:  Heparin level is therapeutic x2. Will continue current rate. Will recheck heparin level with AM labs. CBC with AM labs while on heparin. Plan to continue heparin for 48 hours.    Rocky Morel, PharmD, BCPS 06/09/2019,8:23 PM

## 2019-06-09 NOTE — Progress Notes (Signed)
*  PRELIMINARY RESULTS* Echocardiogram 2D Echocardiogram has been performed.  [facility] 06/09/2019, 10:44 AM

## 2019-06-09 NOTE — ED Provider Notes (Signed)
[facility] Emergency Department Provider Note   ____________________________________________   First MD Initiated Contact with Patient 06/09/19 0303     (approximate)  I have reviewed the triage vital signs and the nursing notes.   HISTORY  Chief Complaint Shortness of Breath    HPI Carlos Nelson is a 69 y.o. male brought to the ED via EMS from care home with a chief complaint of respiratory distress.  Patient has a history of COPD with 2 recent hospitalizations this month for exacerbation.  EMS reports patient's initial room air saturation 89%.  He was given 2 DuoNeb's en route and presents with room air saturations 93%.  Reports dry cough and chest tightness.  Denies fever, chills, abdominal pain, nausea, vomiting or diarrhea.       Past Medical History:  Diagnosis Date  . Chronic respiratory failure with hypoxia ([place])   . COPD (chronic obstructive pulmonary disease) ([place])   . Hypertension   . Schizophrenia [facility])     Patient Active Problem List   Diagnosis Date Noted  . Pleuritic chest pain 05/20/2019  . CAP (community acquired pneumonia) 05/20/2019  . Hypertension   . COPD exacerbation ([place])   . Acute on chronic respiratory failure with hypoxia ([place])   . Altered mental status 09/25/2018  . Schizophrenia ([place])   . Tachycardia 08/21/2018  . Schizophrenia, chronic with acute exacerbation ([place]) 08/17/2018  . Elevated PSA 05/24/2015    Past Surgical History:  Procedure Laterality Date  . COLONOSCOPY    . COLONOSCOPY WITH PROPOFOL N/A 12/13/2016   Procedure: COLONOSCOPY WITH PROPOFOL;  Surgeon: Lollie Sails, MD;  Location: [facility] ENDOSCOPY;  Service: Endoscopy;  Laterality: N/A;  . ESOPHAGOGASTRODUODENOSCOPY (EGD) WITH PROPOFOL N/A 12/13/2016   Procedure: ESOPHAGOGASTRODUODENOSCOPY (EGD) WITH PROPOFOL;  Surgeon: Lollie Sails, MD;  Location[facility] ENDOSCOPY;  Service: Endoscopy;  Laterality: N/A;  . left arm surgery      Prior  to Admission medications   Medication Sig Start Date End Date Taking? Authorizing Provider  albuterol (PROVENTIL) (2.5 MG/3ML) 0.083% nebulizer solution Take 2.5 mg by nebulization every 6 (six) hours as needed for wheezing or shortness of breath.    [provider]  amLODipine-benazepril (LOTREL) 5-20 MG capsule Take 1 capsule by mouth daily.    [provider]  ARIPiprazole ER (ABILIFY MAINTENA) 400 MG PRSY prefilled syringe Inject 400 mg into the muscle every 30 (thirty) days.    [provider]  dextromethorphan-guaiFENesin (MUCINEX DM) 30-600 MG 12hr tablet Take 1 tablet by mouth 2 (two) times daily. 05/22/19   Lorella Nimrod, MD  divalproex (DEPAKOTE ER) 250 MG 24 hr tablet Take 500 mg by mouth at bedtime.    [provider]  donepezil (ARICEPT) 5 MG tablet Take 5 mg by mouth at bedtime.    [provider]  haloperidol (HALDOL) 5 MG tablet Take 2.5 mg by mouth daily.    [provider]  hydrOXYzine (ATARAX/VISTARIL) 50 MG tablet Take 50 mg by mouth every 6 (six) hours as needed for anxiety or itching.    [provider]  Ipratropium-Albuterol (COMBIVENT RESPIMAT) 20-100 MCG/ACT AERS respimat Inhale 1 puff into the lungs every 6 (six) hours as needed for wheezing or shortness of breath.     [provider]  LORazepam (ATIVAN) 1 MG tablet Take 1 tablet (1 mg total) by mouth every 6 (six) hours as needed for anxiety. 08/22/18   Gouru, Illene Silver, MD  QUEtiapine (SEROQUEL) 400 MG tablet Take 800  mg by mouth at bedtime.    [provider]  traZODone (DESYREL) 150 MG tablet Take 300 mg by mouth at bedtime.     [provider]  trihexyphenidyl (ARTANE) 5 MG tablet Take 1 tablet (5 mg total) by mouth 3 (three) times daily with meals. 05/22/19   Lorella Nimrod, MD  vitamin B-12 (CYANOCOBALAMIN) 1000 MCG tablet Take 1,000 mcg by mouth daily.    [provider]    Allergies Ace inhibitors  Family History  Problem  Relation Age of Onset  . Prostate cancer Neg Hx   . Bladder Cancer Neg Hx     Social History Social History   Tobacco Use  . Smoking status: Former Smoker    Packs/day: 1.00    Types: Cigarettes  . Smokeless tobacco: Never Used  Substance Use Topics  . Alcohol use: Yes    Alcohol/week: 0.0 standard drinks    Comment: beer  . Drug use: Yes    Types: Marijuana    Comment: marjuana    Review of Systems  Constitutional: No fever/chills Eyes: No visual changes. ENT: No sore throat. Cardiovascular: Positive for chest tightness. Respiratory: Positive for wheezing and shortness of breath. Gastrointestinal: No abdominal pain.  No nausea, no vomiting.  No diarrhea.  No constipation. Genitourinary: Negative for dysuria. Musculoskeletal: Negative for back pain. Skin: Negative for rash. Neurological: Negative for headaches, focal weakness or numbness.   ____________________________________________   PHYSICAL EXAM:  VITAL SIGNS: ED Triage Vitals [06/09/19 0301]  Enc Vitals Group     BP      Pulse      Resp      Temp      Temp src      SpO2 99 %     Weight      Height      Head Circumference      Peak Flow      Pain Score      Pain Loc      Pain Edu?      Excl. in [place]?     Constitutional: Alert and oriented.  Cachectic appearing and in moderate acute distress. Eyes: Conjunctivae are normal. PERRL. EOMI. Head: Atraumatic. Nose: No congestion/rhinnorhea. Mouth/Throat: Mucous membranes are moist.  Oropharynx non-erythematous. Neck: No stridor.   Cardiovascular: Normal rate, regular rhythm. Grossly normal heart sounds.  Good peripheral circulation. Respiratory: Increased respiratory effort.  Retractions. Lungs diminished bilaterally with wheezing. Gastrointestinal: Soft and nontender. No distention. No abdominal bruits. No CVA tenderness. Musculoskeletal: No lower extremity tenderness nor edema.  No joint effusions. Neurologic:  Normal speech and language. No gross  focal neurologic deficits are appreciated.  Skin:  Skin is warm, dry and intact. No rash noted. Psychiatric: Mood and affect are normal. Speech and behavior are normal.  ____________________________________________   LABS (all labs ordered are listed, but only abnormal results are displayed)  Labs Reviewed  TROPONIN I (HIGH SENSITIVITY) - Abnormal; Notable for the following components:      Result Value   Troponin I (High Sensitivity) 835 (*)    All other components within normal limits  SARS CORONAVIRUS 2 BY RT PCR (HOSPITAL ORDER, [facility])  CBC WITH DIFFERENTIAL/PLATELET  BASIC METABOLIC PANEL  VALPROIC ACID LEVEL   ____________________________________________  EKG  ED ECG REPORT I, Sotirios Navarro J, the attending physician, personally viewed and interpreted this ECG.   Date: 06/09/2019  EKG Time: 0304  Rate: 103  Rhythm: sinus tachycardia  Axis: Normal  Intervals:none  ST&T Change: Nonspecific  ____________________________________________  RADIOLOGY  ED MD interpretation: No acute cardiopulmonary process  Official radiology report(s): DG Chest Port 1 View  Result Date: 06/09/2019 CLINICAL DATA:  Shortness of breath EXAM: PORTABLE CHEST 1 VIEW COMPARISON:  Radiograph 05/23/2019 FINDINGS: No consolidation, features of edema, pneumothorax, or effusion. Pulmonary vascularity is normally distributed. The cardiomediastinal contours are unremarkable. No acute osseous or soft tissue abnormality. Degenerative changes are present in the imaged spine and shoulders. Telemetry leads overlie the chest. IMPRESSION: No acute cardiopulmonary abnormality. Electronically Signed   By: Lovena Le M.D.   On: 06/09/2019 03:41    ____________________________________________   PROCEDURES  Procedure(s) performed (including Critical Care):  .1-3 Lead EKG Interpretation Performed by: Paulette Blanch, MD Authorized by: Paulette Blanch, MD     Interpretation: normal      ECG rate:  96   ECG rate assessment: normal     Rhythm: sinus rhythm     Ectopy: none     Conduction: normal   Comments:     Patient placed on cardiac monitor to monitor for arrhythmias    CRITICAL CARE Performed by: Paulette Blanch   Total critical care time: 45 minutes  Critical care time was exclusive of separately billable procedures and treating other patients.  Critical care was necessary to treat or prevent imminent or life-threatening deterioration.  Critical care was time spent personally by me on the following activities: development of treatment plan with patient and/or surrogate as well as nursing, discussions with consultants, evaluation of patient's response to treatment, examination of patient, obtaining history from patient or surrogate, ordering and performing treatments and interventions, ordering and review of laboratory studies, ordering and review of radiographic studies, pulse oximetry and re-evaluation of patient's condition.  ____________________________________________   INITIAL IMPRESSION / ASSESSMENT AND PLAN / ED COURSE  As part of my medical decision making, I reviewed the following data within the [facility] notes reviewed and incorporated, Labs reviewed, EKG interpreted, Old chart reviewed, Radiograph reviewed, Discussed with admitting physician and Notes from prior ED visits     SHELLEY BARBIAN was evaluated in Emergency Department on 06/09/2019 for the symptoms described in the history of present illness. He was evaluated in the context of the global COVID-19 pandemic, which necessitated consideration that the patient might be at risk for infection with the SARS-CoV-2 virus that causes COVID-19. Institutional protocols and algorithms that pertain to the evaluation of patients at risk for COVID-19 are in a state of rapid change based on information released by regulatory bodies including the CDC and federal and state  organizations. These policies and algorithms were followed during the patient's care in the ED.    69 year old male with COPD presenting in respiratory distress. Differential includes, but is not limited to, viral syndrome, bronchitis including COPD exacerbation, pneumonia, reactive airway disease including asthma, CHF including exacerbation with or without pulmonary/interstitial edema, pneumothorax, ACS, thoracic trauma, and pulmonary embolism.  Will administer 125 mg IV Solu-Medrol, third DuoNeb, obtain basic lab work, chest x-ray and reassess.  Consider BiPAP if patient remains with increased work of breathing.   Clinical Course as of Jun 09 406  Tue Jun 09, 2019  0407 Breathing improved after third DuoNeb.  Clinically does not require BiPAP currently.  Will initiate heparin bolus and drip for elevated troponin which is new from last hospitalization.  Will discuss with hospitalist services for admission.   [JS]    Clinical Course User Index [JS] Lurline Hare  J, MD     ____________________________________________   FINAL CLINICAL IMPRESSION(S) / ED DIAGNOSES  Final diagnoses:  COPD exacerbation ([place])  Hypoxia  NSTEMI (non-ST elevated myocardial infarction) [facility])     ED Discharge Orders    None       Note:  This document was prepared using Dragon voice recognition software and may include unintentional dictation errors.   Paulette Blanch, MD 06/09/19 [phone]

## 2019-06-09 NOTE — Progress Notes (Signed)
ANTICOAGULATION CONSULT NOTE - Initial Consult  Pharmacy Consult for Heparin Indication: chest pain/ACS  Allergies  Allergen Reactions  . Ace Inhibitors Swelling    Ok with benazapril, per grouphome    Patient Measurements: Height: 5\' 4"  (162.6 cm) Weight: 56.7 kg (125 lb) IBW/kg (Calculated) : 59.2 HEPARIN DW (KG): 56.7  Vital Signs: Temp: 98.1 F (36.7 C) (05/25 0310) Temp Source: Oral (05/25 0310) BP: 138/90 (05/25 0442) Pulse Rate: 95 (05/25 0442)  Labs: Recent Labs    06/09/19 0307 06/09/19 0309  HGB 13.2  --   HCT 40.4  --   PLT 202  --   APTT  --  30  LABPROT  --  12.5  INR  --  1.0  CREATININE 1.06  --   TROPONINIHS 835*  --     Estimated Creatinine Clearance: 52 mL/min (by C-G formula based on SCr of 1.06 mg/dL).   Medical History: Past Medical History:  Diagnosis Date  . Chronic respiratory failure with hypoxia ([place])   . COPD (chronic obstructive pulmonary disease) ([place])   . Hypertension   . Schizophrenia ([place])     Medications:  (Not in a hospital admission)   Assessment: Baseline labs ordered.  No anticoagulants per PTA med list.  Heparin initially ordered by EDP, pharmacy asked to follow and manage. Goal of Therapy:  Heparin level 0.3-0.7 units/ml Monitor platelets by anticoagulation protocol: Yes   Plan:  Heparin 3400 unit bolus followed by infusion at 800 units/hr ordered by EDP. Will check heparin level 8 hours after heparin started and adjust per protocol  Hart Robinsons A 06/09/2019,5:04 AM

## 2019-06-10 DIAGNOSIS — E440 Moderate protein-calorie malnutrition: Secondary | ICD-10-CM

## 2019-06-10 LAB — BASIC METABOLIC PANEL
Anion gap: 8 (ref 5–15)
BUN: 15 mg/dL (ref 8–23)
CO2: 27 mmol/L (ref 22–32)
Calcium: 9.2 mg/dL (ref 8.9–10.3)
Chloride: 106 mmol/L (ref 98–111)
Creatinine, Ser: 0.95 mg/dL (ref 0.61–1.24)
GFR calc Af Amer: >60 mL/min (ref >60)
GFR calc non Af Amer: >60 mL/min (ref >60)
Glucose, Bld: 105 mg/dL — ABNORMAL HIGH (ref 70–99)
Potassium: 3.9 mmol/L (ref 3.5–5.1)
Sodium: 141 mmol/L (ref 135–145)

## 2019-06-10 LAB — CBC
HCT: 40.4 % (ref 39.0–52.0)
Hemoglobin: 13.4 g/dL (ref 13.0–17.0)
MCH: 30 pg (ref 26.0–34.0)
MCHC: 33.2 g/dL (ref 30.0–36.0)
MCV: 90.6 fL (ref 80.0–100.0)
Platelets: 197 10*3/uL (ref 150–400)
RBC: 4.46 MIL/uL (ref 4.22–5.81)
RDW: 13.2 % (ref 11.5–15.5)
WBC: 7.6 10*3/uL (ref 4.0–10.5)
nRBC: 0 % (ref 0.0–0.2)

## 2019-06-10 LAB — LIPID PANEL
Cholesterol: 144 mg/dL (ref 0–200)
HDL: 80 mg/dL (ref >40)
LDL Cholesterol: 57 mg/dL (ref 0–99)
Total CHOL/HDL Ratio: 1.8 RATIO
Triglycerides: 33 mg/dL (ref <150)
VLDL: 7 mg/dL (ref 0–40)

## 2019-06-10 LAB — HEPARIN LEVEL (UNFRACTIONATED): Heparin Unfractionated: 0.44 IU/mL (ref 0.30–0.70)

## 2019-06-10 LAB — HEMOGLOBIN A1C
Hgb A1c MFr Bld: 5.7 % — ABNORMAL HIGH (ref 4.8–5.6)
Mean Plasma Glucose: 116.89 mg/dL

## 2019-06-10 MED ORDER — ENSURE ENLIVE PO LIQD
237.0000 mL | Freq: Two times a day (BID) | ORAL | Status: DC
  Administered 2019-06-10 – 2019-06-11 (×3): 237 mL via ORAL

## 2019-06-10 MED ORDER — ISOSORBIDE MONONITRATE ER 30 MG PO TB24
30.0000 mg | ORAL_TABLET | Freq: Every day | ORAL | Status: DC
  Administered 2019-06-10 – 2019-06-12 (×3): 30 mg via ORAL
  Filled 2019-06-10 (×3): qty 1

## 2019-06-10 MED ORDER — ACETAMINOPHEN 325 MG PO TABS: 650.0000 mg | ORAL_TABLET | Freq: Four times a day (QID) | ORAL | Status: DC | PRN

## 2019-06-10 MED ORDER — IPRATROPIUM-ALBUTEROL 0.5-2.5 (3) MG/3ML IN SOLN
3.0000 mL | Freq: Four times a day (QID) | RESPIRATORY_TRACT | Status: DC
  Administered 2019-06-10 – 2019-06-11 (×5): 3 mL via RESPIRATORY_TRACT
  Filled 2019-06-10 (×6): qty 3

## 2019-06-10 NOTE — Plan of Care (Signed)
  Problem: Health Behavior/Discharge Planning: Goal: Ability to manage health-related needs will improve Outcome: Progressing   Problem: Clinical Measurements: Goal: Ability to maintain clinical measurements within normal limits will improve Outcome: Progressing Goal: Cardiovascular complication will be avoided Outcome: Progressing   Problem: Activity: Goal: Risk for activity intolerance will decrease Outcome: Progressing   Problem: Clinical Measurements: Goal: Respiratory complications will improve Outcome: Not Progressing

## 2019-06-10 NOTE — Progress Notes (Addendum)
Progress Note  Patient Name: Carlos Nelson Date of Encounter: 06/10/2019  Primary Cardiologist: Nelva Bush, MD  Subjective   Still has some chest wall tightness and discomfort, which is fairly diffuse and worse w/ coughing and palpation.  Breathing much improved.  Inpatient Medications    Scheduled Meds: . amLODipine  5 mg Oral Daily   And  . benazepril  20 mg Oral Daily  . aspirin EC  81 mg Oral Daily  . atorvastatin  40 mg Oral Daily  . clopidogrel  75 mg Oral Daily  . divalproex  500 mg Oral QHS  . donepezil  5 mg Oral QHS  . haloperidol  2.5 mg Oral Daily  . ipratropium-albuterol  3 mL Nebulization Q4H  . isosorbide mononitrate  30 mg Oral Daily  . methylPREDNISolone (SOLU-MEDROL) injection  40 mg Intravenous Q12H  . QUEtiapine  800 mg Oral QHS  . traZODone  300 mg Oral QHS  . trihexyphenidyl  5 mg Oral TID WC  . vitamin B-12  1,000 mcg Oral Daily   Continuous Infusions: . heparin 800 Units/hr (06/10/19 [id number])   PRN Meds: acetaminophen, albuterol, dextromethorphan-guaiFENesin, hydrALAZINE, hydrOXYzine, LORazepam, morphine injection, nitroGLYCERIN, ondansetron (ZOFRAN) IV   Vital Signs    Vitals:   06/10/19 0013 06/10/19 0406 06/10/19 0445 06/10/19 0722  BP: (!) 148/90  (!) 148/85 (!) 152/88  Pulse: 95  95 96  Resp:    17  Temp: 97.9 F (36.6 C)  97.8 F (36.6 C) 98.2 F (36.8 C)  TempSrc: Oral  Oral Oral  SpO2: 95% 96% 98% 98%  Weight:   55 kg   Height:        Intake/Output Summary (Last 24 hours) at 06/10/2019 0843 Last data filed at 06/10/2019 0724 Gross per 24 hour  Intake 2763.72 ml  Output 2675 ml  Net 88.72 ml   Filed Weights   06/09/19 0305 06/09/19 1120 06/10/19 0445  Weight: 56.7 kg 56.7 kg 55 kg    Physical Exam   GEN: Well nourished, well developed, in no acute distress.  HEENT: Grossly normal.  Neck: Supple, no JVD, carotid bruits, or masses. Cardiac: RRR, distant, no murmurs, rubs, or gallops. No clubbing, cyanosis,  edema.  Radials/DP/PT 2+ and equal bilaterally.  Respiratory:  Respirations regular and unlabored, markedly diminished breath sounds bilat w/ exp wheezing throughout.   GI: Soft, nontender, nondistended, BS + x 4. MS: no deformity or atrophy. Skin: warm and dry, no rash. Neuro:  Strength and sensation are intact. Psych: AAOx3.  Normal affect.  Labs    Chemistry Recent Labs  Lab 06/09/19 0307 06/10/19 0517  NA 142 141  K 4.0 3.9  CL 105 106  CO2 31 27  GLUCOSE 94 105*  BUN 12 15  CREATININE 1.06 0.95  CALCIUM 9.0 9.2  GFRNONAA >60 >60  GFRAA >60 >60  ANIONGAP 6 8     Hematology Recent Labs  Lab 06/09/19 0307 06/10/19 0517  WBC 7.5 7.6  RBC 4.40 4.46  HGB 13.2 13.4  HCT 40.4 40.4  MCV 91.8 90.6  MCH 30.0 30.0  MCHC 32.7 33.2  RDW 13.4 13.2  PLT 202 197    Cardiac Enzymes  Recent Labs  Lab 06/09/19 0307 06/09/19 0508 06/09/19 0842 06/09/19 1155 06/09/19 1358  TROPONINIHS 835* 888* 494* 411* 330*     Lab Results  Component Value Date   CHOL 144 06/10/2019   HDL 80 06/10/2019   LDLCALC 57 06/10/2019   TRIG 33 06/10/2019  CHOLHDL 1.8 06/10/2019    Radiology    DG Chest Port 1 View  Result Date: 06/09/2019 CLINICAL DATA:  Shortness of breath EXAM: PORTABLE CHEST 1 VIEW COMPARISON:  Radiograph 05/23/2019 FINDINGS: No consolidation, features of edema, pneumothorax, or effusion. Pulmonary vascularity is normally distributed. The cardiomediastinal contours are unremarkable. No acute osseous or soft tissue abnormality. Degenerative changes are present in the imaged spine and shoulders. Telemetry leads overlie the chest. IMPRESSION: No acute cardiopulmonary abnormality. Electronically Signed   By: Lovena Le M.D.   On: 06/09/2019 03:41   Telemetry    RSR - Personally Reviewed  Cardiac Studies   2D Echocardiogram 5.25.2021   1. Left ventricular ejection fraction, by estimation, is 60 to 65%. The  left ventricle has normal function. Left ventricular  endocardial border  not optimally defined to evaluate regional wall motion. There is mild left  ventricular hypertrophy. Left  ventricular diastolic function could not be evaluated.   2. Right ventricular systolic function is normal. The right ventricular  size is normal. Mildly increased right ventricular wall thickness.  Tricuspid regurgitation signal is inadequate for assessing PA pressure.   3. The mitral valve is grossly normal. Unable to accurately assess mitral  valve regurgitation.   4. The aortic valve is tricuspid. Aortic valve regurgitation is not  well-assessed. Aortic stenosis is not well-assessed.   5. The inferior vena cava is normal in size with greater than 50%  respiratory variability, suggesting right atrial pressure of 3 mmHg.   Patient Profile      69 y.o. male with a hx of schizophrenia, COPD, anxiety, and HTN, who was admitted 5/25 w/ dyspnea and COPD exacerbation and found to have an elevated HsTrop (peak 888). Echo w/ nl EF.  Assessment & Plan    1.  Demand ischemia:  Admitted to [facility] early 5/25 w/ worsening dyspnea/COPD exacerbation and chest tightness.  ECG w/o acute changes, while HsTrop initially 835, peaked @ 888, and has been downtrending since.  He was offered cath but refused.  Echo 5/25 showed EF 60-65% w/o significant valvular abnormalities.  He reports mild, residual chest tightness that is diffuse, constant, and worsened w/ deep breathing, cough, and palpation.  No events on tele.  In light of nl EF on echo, pt desire to avoid cath, and ongoing wheezing in the setting of COPD exacerbation, we will plan to cont medical therapy w/ heparin for total of 48 hrs (end 04:09 5/27), asa, plavix (loaded yesterday), and high potency statin therapy.  NO  blocker in the setting of COPD flare and active wheezing.  As he cont to report some chest discomfort, we will add a long-acting nitrate.  Barring worsening c/p and/or willingness to pursue cath, we will plan on an  outpatient non-invasive ischemic eval - MV vs. Coronary CTA.  2.  AECOPD:  Likely driving demand ischemia.  Moves very little air. Exp wheezing throughout.  He is a smoker and says that he does not plan on quitting.  Steroids, nebs per IM.  3.  Essential HTN:  BP 140's - 150's.  Cont CCB and ACEi.  Adding long-acting nitrate in the setting of above.  4.  Tob abuse:  Cessation advised.  He is not planning to quit.  5.  Schizophrenia:  Stable.  Per IM.  6.  Lipids:  Statin added in setting of demand ischemia.  LDL @ goal @ 57.  Had mildly elevated ALT earlier this month.  Will get f/u in AM and will need  f/u lipids/lft's in ~ 6 wks.  Signed, Murray Hodgkins, NP  06/10/2019, 8:43 AM    For questions or updates, please contact   Please consult [email or web address] for contact info under Cardiology/STEMI.

## 2019-06-10 NOTE — Progress Notes (Signed)
[name], male   DOB: Sep 19, 1950, 69 y.o.   MRN: [id number] [facility] PROGRESS NOTE  Carlos Nelson [id number] DOB: February 09, 1950 DOA: 06/09/2019 PCP: Jodi Marble, MD  HPI/Subjective: Patient feeling okay.  Little bit of cough and a little bit of shortness of breath.  Came in and had some chest pain also.  Objective: Vitals:   06/10/19 0722 06/10/19 1058  BP: (!) 152/88 (!) 142/93  Pulse: 96 92  Resp: 17 17  Temp: 98.2 F (36.8 C) 97.8 F (36.6 C)  SpO2: 98% 96%    Intake/Output Summary (Last 24 hours) at 06/10/2019 1537 Last data filed at 06/10/2019 1425 Gross per 24 hour  Intake 2523.72 ml  Output 2725 ml  Net -201.28 ml   Filed Weights   06/09/19 0305 06/09/19 1120 06/10/19 0445  Weight: 56.7 kg 56.7 kg 55 kg    ROS: Review of Systems  Constitutional: Negative for fever.  Eyes: Negative for blurred vision.  Respiratory: Positive for cough and shortness of breath.   Cardiovascular: Negative for chest pain.  Gastrointestinal: Negative for abdominal pain, constipation, diarrhea, nausea and vomiting.  Genitourinary: Negative for dysuria.  Musculoskeletal: Negative for joint pain.  Neurological: Negative for headaches.   Exam: Physical Exam  Constitutional: He is oriented to person, place, and time.  HENT:  Nose: No mucosal edema.  Mouth/Throat: No oropharyngeal exudate or posterior oropharyngeal edema.  Eyes: Conjunctivae and lids are normal.  Neck: Carotid bruit is not present.  Cardiovascular: S1 normal and S2 normal. Exam reveals no gallop.  No murmur heard. Respiratory: No respiratory distress. He has decreased breath sounds in the right lower field and the left lower field. He has wheezes in the right lower field and the left lower field. He has no rhonchi. He has no rales.  GI: Soft. Bowel sounds are normal. There is no abdominal tenderness.  Musculoskeletal:     Right ankle: No swelling.     Left ankle: No swelling.   Lymphadenopathy:    He has no cervical adenopathy.  Neurological: He is alert and oriented to person, place, and time. No cranial nerve deficit.  Skin: Skin is warm. No rash noted. Nails show no clubbing.  Psychiatric: He has a normal mood and affect.      Data Reviewed: Basic Metabolic Panel: Recent Labs  Lab 06/09/19 0307 06/10/19 0517  NA 142 141  K 4.0 3.9  CL 105 106  CO2 31 27  GLUCOSE 94 105*  BUN 12 15  CREATININE 1.06 0.95  CALCIUM 9.0 9.2   CBC: Recent Labs  Lab 06/09/19 0307 06/10/19 0517  WBC 7.5 7.6  NEUTROABS 4.4  --   HGB 13.2 13.4  HCT 40.4 40.4  MCV 91.8 90.6  PLT 202 197   BNP (last 3 results) Recent Labs    05/20/19 0455  BNP 27.0      Recent Results (from the past 240 hour(s))  SARS Coronavirus 2 by RT PCR (hospital order, performed in [facility] hospital lab) Nasopharyngeal Nasopharyngeal Swab     Status: None   Collection Time: 06/09/19  3:08 AM   Specimen: Nasopharyngeal Swab  Result Value Ref Range Status   SARS Coronavirus 2 NEGATIVE NEGATIVE Final    Comment: (NOTE) SARS-CoV-2 target nucleic acids are NOT DETECTED. The SARS-CoV-2 RNA is generally detectable in upper and lower respiratory specimens during the acute phase of infection. The lowest concentration of SARS-CoV-2 viral copies this assay can detect is 250 copies /  mL. A negative result does not preclude SARS-CoV-2 infection and should not be used as the sole basis for treatment or other patient management decisions.  A negative result may occur with improper specimen collection / handling, submission of specimen other than nasopharyngeal swab, presence of viral mutation(s) within the areas targeted by this assay, and inadequate number of viral copies (<250 copies / mL). A negative result must be combined with clinical observations, patient history, and epidemiological information. Fact Sheet for Patients:   StrictlyIdeas.no Fact Sheet for  Healthcare Providers: BankingDealers.co.za This test is not yet approved or cleared  by the [place] FDA and has been authorized for detection and/or diagnosis of SARS-CoV-2 by FDA under an Emergency Use Authorization (EUA).  This EUA will remain in effect (meaning this test can be used) for the duration of the COVID-19 declaration under Section 564(b)(1) of the Act, 21 U.S.C. section 360bbb-3(b)(1), unless the authorization is terminated or revoked sooner. Performed at [facility], [address] [place], [place] [address]      Studies: DG Chest Berry 1 View  Result Date: 06/09/2019 CLINICAL DATA:  Shortness of breath EXAM: PORTABLE CHEST 1 VIEW COMPARISON:  Radiograph 05/23/2019 FINDINGS: No consolidation, features of edema, pneumothorax, or effusion. Pulmonary vascularity is normally distributed. The cardiomediastinal contours are unremarkable. No acute osseous or soft tissue abnormality. Degenerative changes are present in the imaged spine and shoulders. Telemetry leads overlie the chest. IMPRESSION: No acute cardiopulmonary abnormality. Electronically Signed   By: Lovena Le M.D.   On: 06/09/2019 03:41   ECHOCARDIOGRAM COMPLETE  Result Date: 06/09/2019    ECHOCARDIOGRAM REPORT   Patient Name:   Carlos Nelson Date of Exam: 06/09/2019 Medical Rec #:  [id number]         Height:       64.0 in Accession #:    [id number]        Weight:       125.0 lb Date of Birth:  July 29, 1950          BSA:          1.602 m Patient Age:    69 years          BP:           108/62 mmHg Patient Gender: M                 HR:           76 bpm. Exam Location:  [facility] Procedure: 2D Echo, Cardiac Doppler and Color Doppler Indications:     Elevated troponin  History:         Patient has no prior history of Echocardiogram examinations.                  Risk Factors:Hypertension. Schizophrenia.  Sonographer:     Sherrie Sport RDCS (AE) Referring Phys:  [id number] [place] Diagnosing Phys:  Nelva Bush MD  Sonographer Comments: Technically difficult study due to poor echo windows, no apical window and suboptimal parasternal window. Image acquisition challenging due to patient body habitus. IMPRESSIONS  1. Left ventricular ejection fraction, by estimation, is 60 to 65%. The left ventricle has normal function. Left ventricular endocardial border not optimally defined to evaluate regional wall motion. There is mild left ventricular hypertrophy. Left ventricular diastolic function could not be evaluated.  2. Right ventricular systolic function is normal. The right ventricular size is normal. Mildly increased right ventricular wall thickness. Tricuspid regurgitation signal is inadequate for assessing  PA pressure.  3. The mitral valve is grossly normal. Unable to accurately assess mitral valve regurgitation.  4. The aortic valve is tricuspid. Aortic valve regurgitation is not well-assessed. Aortic stenosis is not well-assessed.  5. The inferior vena cava is normal in size with greater than 50% respiratory variability, suggesting right atrial pressure of 3 mmHg. FINDINGS  Left Ventricle: Left ventricular ejection fraction, by estimation, is 60 to 65%. The left ventricle has normal function. Left ventricular endocardial border not optimally defined to evaluate regional wall motion. The left ventricular internal cavity size was normal in size. There is mild left ventricular hypertrophy. Left ventricular diastolic function could not be evaluated. Right Ventricle: The right ventricular size is normal. Mildly increased right ventricular wall thickness. Right ventricular systolic function is normal. Tricuspid regurgitation signal is inadequate for assessing PA pressure. Left Atrium: Left atrial size was not well visualized. Right Atrium: Right atrial size was not well visualized. Pericardium: There is no evidence of pericardial effusion. Mitral Valve: The mitral valve is grossly normal. Unable to accurately  assess mitral valve regurgitation. Tricuspid Valve: The tricuspid valve is grossly normal. Tricuspid valve regurgitation is trivial. Aortic Valve: The aortic valve is tricuspid. Aortic valve regurgitation is not well-assessed. Aortic stenosis is not well-assessed. There is mild calcification of the aortic valve. Pulmonic Valve: The pulmonic valve was not well visualized. Pulmonic valve regurgitation is not visualized. No evidence of pulmonic stenosis. Aorta: The aortic root is normal in size and structure. Pulmonary Artery: The pulmonary artery is not well seen. Venous: The inferior vena cava is normal in size with greater than 50% respiratory variability, suggesting right atrial pressure of 3 mmHg. IAS/Shunts: The interatrial septum was not well visualized.  LEFT VENTRICLE PLAX 2D LVIDd:         3.43 cm LVIDs:         2.41 cm LV PW:         1.26 cm LV IVS:        1.02 cm LVOT diam:     2.00 cm LVOT Area:     3.14 cm  LEFT ATRIUM         Index LA diam:    1.60 cm 1.00 cm/m                        PULMONIC VALVE AORTA                 PV Vmax:        0.95 m/s Ao Root diam: 2.90 cm PV Peak grad:   3.6 mmHg                       RVOT Peak grad: 3 mmHg   SHUNTS Systemic Diam: 2.00 cm Nelva Bush MD Electronically signed by Nelva Bush MD Signature Date/Time: 06/09/2019/11:47:19 AM    Final     Scheduled Meds: . amLODipine  5 mg Oral Daily   And  . benazepril  20 mg Oral Daily  . aspirin EC  81 mg Oral Daily  . atorvastatin  40 mg Oral Daily  . clopidogrel  75 mg Oral Daily  . divalproex  500 mg Oral QHS  . donepezil  5 mg Oral QHS  . feeding supplement (ENSURE ENLIVE)  237 mL Oral BID BM  . haloperidol  2.5 mg Oral Daily  . ipratropium-albuterol  3 mL Nebulization Q6H  . isosorbide mononitrate  30 mg Oral Daily  .  methylPREDNISolone (SOLU-MEDROL) injection  40 mg Intravenous Q12H  . QUEtiapine  800 mg Oral QHS  . traZODone  300 mg Oral QHS  . trihexyphenidyl  5 mg Oral TID WC  . vitamin B-12   1,000 mcg Oral Daily   Continuous Infusions: . heparin 800 Units/hr (06/10/19 [id number])    Assessment/Plan:  1. Acute on chronic hypoxia secondary to COPD exacerbation.  Continue 2 L of oxygen.  Continue Solu-Medrol and nebulizer treatments. 2. NSTEMI.  Patient deferred invasive procedure at this time.  Currently on aspirin, Plavix, Imdur.  Holding on beta-blocker secondary to bronchospasm. 3. Schizophrenia.  Continue psychiatric medications 4. Essential hypertension on Norvasc and benazepril 5. Moderate malnutrition 6. Weakness.  PT evaluation      Code Status:     Code Status Orders  (From admission, onward)         Start     Ordered   06/09/19 1009  Full code  Continuous     06/09/19 1008        Code Status History    Date Active Date Inactive Code Status Order ID Comments User Context   05/24/2019 0018 05/25/2019 1827 Full Code [id number]  Jenkins Rouge, MD ED   05/20/2019 0827 05/22/2019 2049 Full Code [id number]  Ivor Costa, MD ED   08/21/2018 0446 08/22/2018 2048 Full Code [id number]  Harrie Foreman, MD Inpatient   Advance Care Planning Activity     Family Communication: Left message for the patient's sister Disposition Plan: Status is: Inpatient  Dispo: The patient is from: Group home              Anticipated d/c is to: Group home              Anticipated d/c date is: 06/12/2019              Patient currently receiving IV heparin drip for NSTEMI and IV Solu-Medrol for COPD exacerbation  Consultants:  Cardiology  Time spent: 28 minutes  [place]

## 2019-06-10 NOTE — Progress Notes (Signed)
Initial Nutrition Assessment  DOCUMENTATION CODES:   Non-severe (moderate) malnutrition in context of chronic illness  INTERVENTION:  Provide Ensure Enlive po BID, each supplement provides 350 kcal and 20 grams of protein.  NUTRITION DIAGNOSIS:   Moderate Malnutrition related to chronic illness(COPD) as evidenced by mild fat depletion, moderate fat depletion, moderate muscle depletion.  GOAL:   Patient will meet greater than or equal to 90% of their needs  MONITOR:   PO intake, Supplement acceptance, Labs, Weight trends, I & O's  REASON FOR ASSESSMENT:   Consult Assessment of nutrition requirement/status  ASSESSMENT:   69 year old male with PMHx of COPD, schizophrenia, HTN admitted with COPD exacerbation, NSTEMI/atypical chest pain (patient prefers medical therapy at this time).   Met with patient at bedside. He is from [place] group home. He reports his appetite has been "so-so" lately. He reports he eats 3 meals per day at his group home. He does endorse finishing most of those meals. He reports he drinks Muscle Milk occasionally. Here he is eating 100% of meals. Discussed increased calorie/protein needs in setting of COPD. Patient is amenable to drinking Ensure Enlive to help meet calorie/protein needs.  Patient reports his UBW was 150 lbs many years ago and that he has lost weight over time. Patient is currently 55 kg (121.2 lbs).  Medications reviewed and include: Solu-Medrol 40 mg Q12hrs IV, vitamin B12 1000 micrograms daily, heparin.  Labs reviewed.  NUTRITION - FOCUSED PHYSICAL EXAM:    Most Recent Value  Orbital Region  Moderate depletion  Upper Arm Region  Moderate depletion  Thoracic and Lumbar Region  Mild depletion  Buccal Region  Mild depletion  Temple Region  Severe depletion  Clavicle Bone Region  Moderate depletion  Clavicle and Acromion Bone Region  Moderate depletion  Scapular Bone Region  Moderate depletion  Dorsal Hand  Mild depletion   Patellar Region  Moderate depletion  Anterior Thigh Region  Moderate depletion  Posterior Calf Region  Moderate depletion  Edema (RD Assessment)  None  Hair  Reviewed  Eyes  Reviewed  Mouth  Reviewed  Skin  Reviewed  Nails  Reviewed     Diet Order:   Diet Order            Diet Heart Room service appropriate? Yes; Fluid consistency: Thin  Diet effective now             EDUCATION NEEDS:   No education needs have been identified at this time  Skin:  Skin Assessment: Reviewed RN Assessment  Last BM:  06/09/2019 per chart  Height:   Ht Readings from Last 1 Encounters:  06/09/19 5' 4"  (1.626 m)   Weight:   Wt Readings from Last 1 Encounters:  06/10/19 55 kg   BMI:  Body mass index is 20.8 kg/m.  Estimated Nutritional Needs:   Kcal:  1600-1800  Protein:  80-90 grams  Fluid:  1.6-1.8 L/day  Jacklynn Barnacle, MS, RD, LDN Pager number available on Amion

## 2019-06-10 NOTE — Progress Notes (Signed)
ANTICOAGULATION CONSULT NOTE - Follow up [facility] for Heparin Indication: chest pain/ACS  Allergies  Allergen Reactions  . Ace Inhibitors Swelling    Ok with benazapril, per grouphome    Patient Measurements: Height: 5\' 4"  (162.6 cm) Weight: 56.7 kg (125 lb) IBW/kg (Calculated) : 59.2 HEPARIN DW (KG): 56.7  Vital Signs: Temp: 97.8 F (36.6 C) (05/26 0445) Temp Source: Oral (05/26 0445) BP: 148/85 (05/26 0445) Pulse Rate: 95 (05/26 0445)  Labs: Recent Labs    06/09/19 0307 06/09/19 0309 06/09/19 0508 06/09/19 0842 06/09/19 1155 06/09/19 1358 06/09/19 1951 06/10/19 0517  HGB 13.2  --   --   --   --   --   --  13.4  HCT 40.4  --   --   --   --   --   --  40.4  PLT 202  --   --   --   --   --   --  197  APTT  --  30  --   --   --   --   --   --   LABPROT  --  12.5  --   --   --   --   --   --   INR  --  1.0  --   --   --   --   --   --   HEPARINUNFRC  --   --   --   --  0.38  --  0.43 0.44  CREATININE 1.06  --   --   --   --   --   --   --   TROPONINIHS 835*  --    < > 494* 411* 330*  --   --    < > = values in this interval not displayed.    Estimated Creatinine Clearance: 52 mL/min (by C-G formula based on SCr of 1.06 mg/dL).   Medical History: Past Medical History:  Diagnosis Date  . Chronic respiratory failure with hypoxia ([place])   . COPD (chronic obstructive pulmonary disease) ([place])   . Hypertension   . Schizophrenia ([place])     Assessment: Baseline labs ordered.  No anticoagulants per PTA med list.  Heparin initially ordered by EDP, pharmacy asked to follow and manage.  5/25 1155 HL 0.38  5/25 1951 HL 0.43 5/26 0517 HL 0.44, therapeutic x 3.  CBC stable.  Goal of Therapy:  Heparin level 0.3-0.7 units/ml Monitor platelets by anticoagulation protocol: Yes   Plan:  Heparin level is therapeutic x 3. Will continue current rate. Will recheck heparin level with AM labs. CBC with AM labs while on heparin. Plan to continue heparin for 48  hours.    Ena Dawley, PharmD, 06/10/2019,6:11 AM

## 2019-06-10 NOTE — Progress Notes (Signed)
Patient ID: ADON SENN, male   DOB: 04-04-1950, 69 y.o.   MRN: [id number]  Spoke with the patient's sister on the phone and given update  Dr Loletha Grayer

## 2019-06-11 DIAGNOSIS — J9621 Acute and chronic respiratory failure with hypoxia: Secondary | ICD-10-CM

## 2019-06-11 DIAGNOSIS — I248 Other forms of acute ischemic heart disease: Secondary | ICD-10-CM

## 2019-06-11 DIAGNOSIS — F209 Schizophrenia, unspecified: Secondary | ICD-10-CM

## 2019-06-11 LAB — COMPREHENSIVE METABOLIC PANEL
ALT: 24 U/L (ref 0–44)
AST: 18 U/L (ref 15–41)
Albumin: 3.1 g/dL — ABNORMAL LOW (ref 3.5–5.0)
Alkaline Phosphatase: 54 U/L (ref 38–126)
Anion gap: 7 (ref 5–15)
BUN: 15 mg/dL (ref 8–23)
CO2: 27 mmol/L (ref 22–32)
Calcium: 9 mg/dL (ref 8.9–10.3)
Chloride: 104 mmol/L (ref 98–111)
Creatinine, Ser: 0.87 mg/dL (ref 0.61–1.24)
GFR calc Af Amer: >60 mL/min (ref >60)
GFR calc non Af Amer: >60 mL/min (ref >60)
Glucose, Bld: 107 mg/dL — ABNORMAL HIGH (ref 70–99)
Potassium: 4.3 mmol/L (ref 3.5–5.1)
Sodium: 138 mmol/L (ref 135–145)
Total Bilirubin: 0.6 mg/dL (ref 0.3–1.2)
Total Protein: 5.7 g/dL — ABNORMAL LOW (ref 6.5–8.1)

## 2019-06-11 LAB — HEPARIN LEVEL (UNFRACTIONATED): Heparin Unfractionated: 0.26 IU/mL — ABNORMAL LOW (ref 0.30–0.70)

## 2019-06-11 LAB — CBC
HCT: 35.9 % — ABNORMAL LOW (ref 39.0–52.0)
Hemoglobin: 12.1 g/dL — ABNORMAL LOW (ref 13.0–17.0)
MCH: 30.5 pg (ref 26.0–34.0)
MCHC: 33.7 g/dL (ref 30.0–36.0)
MCV: 90.4 fL (ref 80.0–100.0)
Platelets: 203 10*3/uL (ref 150–400)
RBC: 3.97 MIL/uL — ABNORMAL LOW (ref 4.22–5.81)
RDW: 13.3 % (ref 11.5–15.5)
WBC: 9 10*3/uL (ref 4.0–10.5)
nRBC: 0 % (ref 0.0–0.2)

## 2019-06-11 MED ORDER — DILTIAZEM HCL ER COATED BEADS 120 MG PO CP24
120.0000 mg | ORAL_CAPSULE | Freq: Every day | ORAL | Status: DC
  Administered 2019-06-11: 120 mg via ORAL
  Filled 2019-06-11: qty 1

## 2019-06-11 NOTE — Progress Notes (Signed)
[name], male   DOB: 1950/06/19, 69 y.o.   MRN: [id number] [facility] PROGRESS NOTE  Carlos Nelson [id number] DOB: 11/30/1950 DOA: 06/09/2019 PCP: Jodi Marble, MD  HPI/Subjective: Patient still having chest pain.  States his breathing is a little bit better.  Still feeling weak.  Patient now agreeable to have a stress test.  Objective: Vitals:   06/11/19 1128 06/11/19 1549  BP: 133/83 109/67  Pulse: (!) 108 (!) 110  Resp: 17 18  Temp: 98.3 F (36.8 C) 98.2 F (36.8 C)  SpO2: 96% 100%    Intake/Output Summary (Last 24 hours) at 06/11/2019 1719 Last data filed at 06/11/2019 1525 Gross per 24 hour  Intake 600 ml  Output 2850 ml  Net -2250 ml   Filed Weights   06/09/19 1120 06/10/19 0445 06/11/19 0255  Weight: 56.7 kg 55 kg 54.7 kg    ROS: Review of Systems  Constitutional: Negative for fever.  Eyes: Negative for blurred vision.  Respiratory: Positive for cough and shortness of breath.   Cardiovascular: Negative for chest pain.  Gastrointestinal: Negative for abdominal pain, constipation, diarrhea, nausea and vomiting.  Genitourinary: Negative for dysuria.  Musculoskeletal: Negative for joint pain.  Neurological: Negative for headaches.   Exam: Physical Exam  Constitutional: He is oriented to person, place, and time.  HENT:  Nose: No mucosal edema.  Mouth/Throat: No oropharyngeal exudate or posterior oropharyngeal edema.  Eyes: Conjunctivae and lids are normal.  Neck: Carotid bruit is not present.  Cardiovascular: S1 normal and S2 normal. Exam reveals no gallop.  No murmur heard. Respiratory: No respiratory distress. He has decreased breath sounds in the right lower field and the left lower field. He has wheezes in the right lower field and the left lower field. He has no rhonchi. He has no rales.  GI: Soft. Bowel sounds are normal. There is no abdominal tenderness.  Musculoskeletal:     Right ankle: No swelling.     Left  ankle: No swelling.  Lymphadenopathy:    He has no cervical adenopathy.  Neurological: He is alert and oriented to person, place, and time. No cranial nerve deficit.  Skin: Skin is warm. No rash noted. Nails show no clubbing.  Psychiatric: He has a normal mood and affect.      Data Reviewed: Basic Metabolic Panel: Recent Labs  Lab 06/09/19 0307 06/10/19 0517 06/11/19 0523  NA 142 141 138  K 4.0 3.9 4.3  CL 105 106 104  CO2 31 27 27   GLUCOSE 94 105* 107*  BUN 12 15 15   CREATININE 1.06 0.95 0.87  CALCIUM 9.0 9.2 9.0   CBC: Recent Labs  Lab 06/09/19 0307 06/10/19 0517 06/11/19 0523  WBC 7.5 7.6 9.0  NEUTROABS 4.4  --   --   HGB 13.2 13.4 12.1*  HCT 40.4 40.4 35.9*  MCV 91.8 90.6 90.4  PLT 202 197 203   BNP (last 3 results) Recent Labs    05/20/19 0455  BNP 27.0      Recent Results (from the past 240 hour(s))  SARS Coronavirus 2 by RT PCR (hospital order, performed in [facility] hospital lab) Nasopharyngeal Nasopharyngeal Swab     Status: None   Collection Time: 06/09/19  3:08 AM   Specimen: Nasopharyngeal Swab  Result Value Ref Range Status   SARS Coronavirus 2 NEGATIVE NEGATIVE Final    Comment: (NOTE) SARS-CoV-2 target nucleic acids are NOT DETECTED. The SARS-CoV-2 RNA is generally detectable in upper and lower  respiratory specimens during the acute phase of infection. The lowest concentration of SARS-CoV-2 viral copies this assay can detect is 250 copies / mL. A negative result does not preclude SARS-CoV-2 infection and should not be used as the sole basis for treatment or other patient management decisions.  A negative result may occur with improper specimen collection / handling, submission of specimen other than nasopharyngeal swab, presence of viral mutation(s) within the areas targeted by this assay, and inadequate number of viral copies (<250 copies / mL). A negative result must be combined with clinical observations, patient history, and  epidemiological information. Fact Sheet for Patients:   StrictlyIdeas.no Fact Sheet for Healthcare Providers: BankingDealers.co.za This test is not yet approved or cleared  by the [place] FDA and has been authorized for detection and/or diagnosis of SARS-CoV-2 by FDA under an Emergency Use Authorization (EUA).  This EUA will remain in effect (meaning this test can be used) for the duration of the COVID-19 declaration under Section 564(b)(1) of the Act, 21 U.S.C. section 360bbb-3(b)(1), unless the authorization is terminated or revoked sooner. Performed at [facility], [address] [place], [place] [address]      Scheduled Meds: . aspirin EC  81 mg Oral Daily  . atorvastatin  40 mg Oral Daily  . benazepril  20 mg Oral Daily  . clopidogrel  75 mg Oral Daily  . diltiazem  120 mg Oral Daily  . divalproex  500 mg Oral QHS  . donepezil  5 mg Oral QHS  . feeding supplement (ENSURE ENLIVE)  237 mL Oral BID BM  . haloperidol  2.5 mg Oral Daily  . ipratropium-albuterol  3 mL Nebulization Q6H  . isosorbide mononitrate  30 mg Oral Daily  . methylPREDNISolone (SOLU-MEDROL) injection  40 mg Intravenous Q12H  . QUEtiapine  800 mg Oral QHS  . traZODone  300 mg Oral QHS  . trihexyphenidyl  5 mg Oral TID WC  . vitamin B-12  1,000 mcg Oral Daily      Assessment/Plan:  1. Acute respiratory failure with hypoxia secondary to COPD exacerbation.  Check pulse ox on room air with ambulation.  Continue Solu-Medrol. 2. NSTEMI.  Patient deferred invasive procedure at this time.  Currently on aspirin, Plavix, Imdur.  Holding on beta-blocker secondary to bronchospasm.  Patient agreeable to stress test tomorrow. 3. Schizophrenia.  Continue psychiatric medications 4. Essential hypertension on Norvasc and benazepril 5. Moderate malnutrition 6. Weakness.  PT recommends home with home health.      Code Status:     Code Status Orders   (From admission, onward)         Start     Ordered   06/09/19 1009  Full code  Continuous     06/09/19 1008        Code Status History    Date Active Date Inactive Code Status Order ID Comments User Context   05/24/2019 0018 05/25/2019 1827 Full Code [id number]  Jenkins Rouge, MD ED   05/20/2019 0827 05/22/2019 2049 Full Code [id number]  Ivor Costa, MD ED   08/21/2018 0446 08/22/2018 2048 Full Code [id number]  Harrie Foreman, MD Inpatient   Advance Care Planning Activity     Family Communication: Spoke with patient's sister on the phone Disposition Plan: Status is: Inpatient  Dispo: The patient is from: Group home              Anticipated d/c is to: Group home  Anticipated d/c date is: 06/12/2019              Patient currently receiving IV Solu-Medrol for COPD exacerbation.  Patient is now agreeable to stress test will which will be done tomorrow morning.  Consultants:  Cardiology  Time spent: 27 minutes  [place]

## 2019-06-11 NOTE — Evaluation (Signed)
Physical Therapy Evaluation Patient Details Name: Carlos Nelson MRN: [id number] DOB: 1950/01/25 Today's Date: 06/11/2019   History of Present Illness  69 y/o male who was here earlier in the month secondary to CP, SOB, elevated BP; admitted for management of chronic respiratory failure due to COPD. Returns with similar symptoms of COPD exacerbation  Clinical Impression  Pt on 2L O2 t/o the effort, sats generally staying in the low 90s but did not seem to drop significantly with ~200 ft of ambulation.  Per prior admission notes and pt confirmation he is not walking was confidently and as fast as is his typical, pt open to the idea of having PT work with him at home once he is medically cleared for d/c.      Follow Up Recommendations Home health PT    Equipment Recommendations  (reports there is DME at group home if he needs)    Recommendations for Other Services       Precautions / Restrictions Precautions Precautions: Fall Restrictions Weight Bearing Restrictions: No      Mobility  Bed Mobility Overal bed mobility: Independent                Transfers Overall transfer level: Modified independent Equipment used: None             General transfer comment: minimal guarding and cuing for safety, but able to rise w/o direct assist  Ambulation/Gait Ambulation/Gait assistance: Min guard Gait Distance (Feet): 200 Feet Assistive device: None       General Gait Details: Pt able to circumambulate the nurses station w/o AD.  He had slow and shuffling steps (per notes seems to be less confident and consistent than last admit).  Pt on 2L O2 t/o the effort, sats generally staying in the low 90s with increased fatigue and shortness of breath on return to room  Stairs            Wheelchair Mobility    Modified Rankin (Stroke Patients Only)       Balance Overall balance assessment: Modified Independent                                            Pertinent Vitals/Pain Pain Assessment: (minimal ungraded chest pain)    Home Living Family/patient expects to be discharged to:: Group home Living Arrangements: Group Home  3 to 6 steps with rail to enter, does not use ADs                  Prior Function Level of Independence: Independent         Comments: Indep with ADLs, household and community mobilization without assist device; denies fall history.  Responsible for 'taking out the trash' in group home environment     Hand Dominance        Extremity/Trunk Assessment   Upper Extremity Assessment Upper Extremity Assessment: Generalized weakness;Overall [facility] for tasks assessed    Lower Extremity Assessment Lower Extremity Assessment: Generalized weakness;Overall WFL for tasks assessed       Communication   Communication: No difficulties  Cognition Arousal/Alertness: Awake/alert Behavior During Therapy: WFL for tasks assessed/performed Overall Cognitive Status: Within Functional Limits for tasks assessed  General Comments      Exercises     Assessment/Plan    PT Assessment Patient needs continued PT services  PT Problem List Decreased activity tolerance;Decreased mobility;Cardiopulmonary status limiting activity       PT Treatment Interventions Gait training;Therapeutic activities;Patient/family education;Stair training;Functional mobility training;Therapeutic exercise;Balance training;Neuromuscular re-education    PT Goals (Current goals can be found in the Care Plan section)  Acute Rehab PT Goals Patient Stated Goal: to get back home PT Goal Formulation: With patient Time For Goal Achievement: 06/25/19 Potential to Achieve Goals: Good    Frequency Min 2X/week   Barriers to discharge        Co-evaluation               AM-PAC PT "6 Clicks" Mobility  Outcome Measure Help needed turning from your back to your side while in a  flat bed without using bedrails?: None Help needed moving from lying on your back to sitting on the side of a flat bed without using bedrails?: None Help needed moving to and from a bed to a chair (including a wheelchair)?: None Help needed standing up from a chair using your arms (e.g., wheelchair or bedside chair)?: None Help needed to walk in hospital room?: None Help needed climbing 3-5 steps with a railing? : A Little 6 Click Score: 23    End of Session Equipment Utilized During Treatment: Gait belt;Oxygen Activity Tolerance: Patient tolerated treatment well Patient left: with call bell/phone within reach;with chair alarm set Nurse Communication: Mobility status PT Visit Diagnosis: Muscle weakness (generalized) (M62.81)    Time: [id number] PT Time Calculation (min) (ACUTE ONLY): 23 min   Charges:   PT Evaluation $PT Eval Low Complexity: 1 Low PT Treatments $Gait Training: 8-22 mins        Kreg Shropshire, DPT 06/11/2019, 10:44 AM

## 2019-06-11 NOTE — Progress Notes (Signed)
Progress Note  Patient Name: Carlos Nelson Date of Encounter: 06/11/2019  Primary Cardiologist: Nelva Bush, MD  Subjective   Ambulated this AM w/ PT.  Still with diffuse, bilateral chest wall pain and soreness.  This did not worsen w/ ambulation.  Inpatient Medications    Scheduled Meds: . amLODipine  5 mg Oral Daily   And  . benazepril  20 mg Oral Daily  . aspirin EC  81 mg Oral Daily  . atorvastatin  40 mg Oral Daily  . clopidogrel  75 mg Oral Daily  . divalproex  500 mg Oral QHS  . donepezil  5 mg Oral QHS  . feeding supplement (ENSURE ENLIVE)  237 mL Oral BID BM  . haloperidol  2.5 mg Oral Daily  . ipratropium-albuterol  3 mL Nebulization Q6H  . isosorbide mononitrate  30 mg Oral Daily  . methylPREDNISolone (SOLU-MEDROL) injection  40 mg Intravenous Q12H  . QUEtiapine  800 mg Oral QHS  . traZODone  300 mg Oral QHS  . trihexyphenidyl  5 mg Oral TID WC  . vitamin B-12  1,000 mcg Oral Daily   Continuous Infusions: . heparin 1,000 Units/hr (06/11/19 0749)   PRN Meds: acetaminophen, albuterol, dextromethorphan-guaiFENesin, hydrALAZINE, hydrOXYzine, LORazepam, morphine injection, nitroGLYCERIN, ondansetron (ZOFRAN) IV   Vital Signs    Vitals:   06/10/19 1915 06/10/19 2237 06/11/19 0255 06/11/19 0725  BP: 139/72 139/79 134/86 129/85  Pulse: (!) 110 (!) 103 84 (!) 102  Resp: 19  18 17   Temp: 98.6 F (37 C) 98.2 F (36.8 C) 97.9 F (36.6 C) 97.9 F (36.6 C)  TempSrc: Oral Oral    SpO2: 95% 96% 91% 99%  Weight:   54.7 kg   Height:        Intake/Output Summary (Last 24 hours) at 06/11/2019 0852 Last data filed at 06/11/2019 0726 Gross per 24 hour  Intake 480 ml  Output 2675 ml  Net -2195 ml   Filed Weights   06/09/19 1120 06/10/19 0445 06/11/19 0255  Weight: 56.7 kg 55 kg 54.7 kg    Physical Exam   GEN: Well nourished, well developed, in no acute distress.  HEENT: Grossly normal.  Neck: Supple, no JVD, carotid bruits, or masses. Cardiac:  RRR, distant, no murmurs, rubs, or gallops. No clubbing, cyanosis, edema.  Radials/DP/PT 2+ and equal bilaterally.  Respiratory:  Respirations regular and unlabored, markedly diminished breath sounds bilaterally. No wheezing this AM. GI: Soft, nontender, nondistended, BS + x 4. MS: no deformity or atrophy. Skin: warm and dry, no rash. Neuro:  Strength and sensation are intact. Psych: AAOx3.  Normal affect.  Labs    Chemistry Recent Labs  Lab 06/09/19 0307 06/10/19 0517 06/11/19 0523  NA 142 141 138  K 4.0 3.9 4.3  CL 105 106 104  CO2 31 27 27   GLUCOSE 94 105* 107*  BUN 12 15 15   CREATININE 1.06 0.95 0.87  CALCIUM 9.0 9.2 9.0  PROT  --   --  5.7*  ALBUMIN  --   --  3.1*  AST  --   --  18  ALT  --   --  24  ALKPHOS  --   --  54  BILITOT  --   --  0.6  GFRNONAA >60 >60 >60  GFRAA >60 >60 >60  ANIONGAP 6 8 7      Hematology Recent Labs  Lab 06/09/19 0307 06/10/19 0517 06/11/19 0523  WBC 7.5 7.6 9.0  RBC 4.40 4.46 3.97*  HGB 13.2 13.4 12.1*  HCT 40.4 40.4 35.9*  MCV 91.8 90.6 90.4  MCH 30.0 30.0 30.5  MCHC 32.7 33.2 33.7  RDW 13.4 13.2 13.3  PLT 202 197 203    Cardiac Enzymes  Recent Labs  Lab 06/09/19 0307 06/09/19 0508 06/09/19 0842 06/09/19 1155 06/09/19 1358  TROPONINIHS 835* 888* 494* 411* 330*      Lab Results  Component Value Date   CHOL 144 06/10/2019   HDL 80 06/10/2019   LDLCALC 57 06/10/2019   TRIG 33 06/10/2019   CHOLHDL 1.8 06/10/2019     Radiology    DG Chest Port 1 View  Result Date: 06/09/2019 CLINICAL DATA:  Shortness of breath EXAM: PORTABLE CHEST 1 VIEW COMPARISON:  Radiograph 05/23/2019 FINDINGS: No consolidation, features of edema, pneumothorax, or effusion. Pulmonary vascularity is normally distributed. The cardiomediastinal contours are unremarkable. No acute osseous or soft tissue abnormality. Degenerative changes are present in the imaged spine and shoulders. Telemetry leads overlie the chest. IMPRESSION: No acute  cardiopulmonary abnormality. Electronically Signed   By: Lovena Le M.D.   On: 06/09/2019 03:41    Telemetry    RSR, 90's - Personally Reviewed  Cardiac Studies   2D Echocardiogram 5.25.2021  1. Left ventricular ejection fraction, by estimation, is 60 to 65%. The  left ventricle has normal function. Left ventricular endocardial border  not optimally defined to evaluate regional wall motion. There is mild left  ventricular hypertrophy. Left  ventricular diastolic function could not be evaluated.  2. Right ventricular systolic function is normal. The right ventricular  size is normal. Mildly increased right ventricular wall thickness.  Tricuspid regurgitation signal is inadequate for assessing PA pressure.  3. The mitral valve is grossly normal. Unable to accurately assess mitral  valve regurgitation.  4. The aortic valve is tricuspid. Aortic valve regurgitation is not  well-assessed. Aortic stenosis is not well-assessed.  5. The inferior vena cava is normal in size with greater than 50%  respiratory variability, suggesting right atrial pressure of 3 mmHg.   Patient Profile   69 y.o.malewith a hx of schizophrenia, COPD, anxiety, and HTN, who was admitted 5/25 w/ dyspnea and COPD exacerbation and found to have an elevated HsTrop (peak 888). Echo w/ nl EF.  Assessment & Plan    1.  Demand Ischemia:  Admitted to [facility] 5/25 w/ worsening dyspena/COPD exacerbation and chest tightness.  ECG w/o acute changes, while HsTrop initally 835, peaked @ 888, and has been downtrending since.  Offered cath but refused.  Echo 5/25 w/ nl EF and w/o significant valvular abnormalities.  He has cont to have mild, diffuse, chest wall pain and soreness, that is reproducible w/ palpation.  In light of nl EF on echo, desire to avoid cath, and COPD exacerbation, we will plan to cont med rx (asa, plavix, high potency statin, and nitrate) and reassess as outpt w/ plan to pursue myoview vs cor CTA if  willing.  Heparin d/c'd earlier this AM (ran for 48hrs).  No  blocker in setting of COPD flare.  2. AECOPD:  Likely driving demand ischemia.  Moves little air but not wheezing this AM.  He does not plan to quit smoking.  Steroids/nebs per IM>  3.  Essential HTN:  BPs better following addition of nitrate yesterday.  Cont ccb and acei.  4.  Tob abuse:  Cessation advised.  He is not planning to quit.  5.  Schizophrenia:  Stable.  Per IM.  6.  Lipids:  LDL 57  on statin rx.  ALT wnl.  Signed, Murray Hodgkins, NP  06/11/2019, 8:52 AM    For questions or updates, please contact   Please consult [email or web address] for contact info under Cardiology/STEMI.

## 2019-06-11 NOTE — Progress Notes (Signed)
ANTICOAGULATION CONSULT NOTE - Follow up [place] for Heparin Indication: chest pain/ACS  Allergies  Allergen Reactions  . Ace Inhibitors Swelling    Ok with benazapril, per grouphome    Patient Measurements: Height: 5\' 4"  (162.6 cm) Weight: 54.7 kg (120 lb 9.6 oz) IBW/kg (Calculated) : 59.2 HEPARIN DW (KG): 56.7  Vital Signs: Temp: 97.9 F (36.6 C) (05/27 0255) Temp Source: Oral (05/26 2237) BP: 134/86 (05/27 0255) Pulse Rate: 84 (05/27 0255)  Labs: Recent Labs    06/09/19 0307 06/09/19 0307 06/09/19 0309 06/09/19 0508 06/09/19 0842 06/09/19 1155 06/09/19 1155 06/09/19 1358 06/09/19 1951 06/10/19 0517 06/11/19 0523  HGB 13.2   < >  --   --   --   --   --   --   --  13.4 12.1*  HCT 40.4  --   --   --   --   --   --   --   --  40.4 35.9*  PLT 202  --   --   --   --   --   --   --   --  197 203  APTT  --   --  30  --   --   --   --   --   --   --   --   LABPROT  --   --  12.5  --   --   --   --   --   --   --   --   INR  --   --  1.0  --   --   --   --   --   --   --   --   HEPARINUNFRC  --   --   --   --   --  0.38   < >  --  0.43 0.44 0.26*  CREATININE 1.06  --   --   --   --   --   --   --   --  0.95 0.87  TROPONINIHS 835*  --   --    < > 494* 411*  --  330*  --   --   --    < > = values in this interval not displayed.    Estimated Creatinine Clearance: 61.1 mL/min (by C-G formula based on SCr of 0.87 mg/dL).   Medical History: Past Medical History:  Diagnosis Date  . Chronic respiratory failure with hypoxia ([place])   . COPD (chronic obstructive pulmonary disease) ([place])   . Hypertension   . Schizophrenia ([place])     Assessment: Baseline labs ordered.  No anticoagulants per PTA med list.  Heparin initially ordered by EDP, pharmacy asked to follow and manage.  5/25 1155 HL 0.38  5/25 1951 HL 0.43 5/26 0517 HL 0.44, therapeutic x 3.  CBC stable. 0527 0523 HL 0.26, SUBtherapeutic.  H/H worse, PLTs OK  Goal of Therapy:  Heparin level  0.3-0.7 units/ml Monitor platelets by anticoagulation protocol: Yes   Plan:  Heparin level is SUBtherapeutic.  Will increase Heparin drip to 1000 units/hr.  Will recheck heparin level in 8 hours. CBC with AM labs while on heparin.  Plan to continue heparin for 48 hours.    Ena Dawley, PharmD, 06/11/2019,6:52 AM

## 2019-06-12 ENCOUNTER — Encounter: Payer: Self-pay | Admitting: Internal Medicine

## 2019-06-12 ENCOUNTER — Inpatient Hospital Stay: Payer: Medicare Other

## 2019-06-12 ENCOUNTER — Other Ambulatory Visit: Payer: Self-pay

## 2019-06-12 ENCOUNTER — Inpatient Hospital Stay (HOSPITAL_COMMUNITY): Payer: Medicare Other

## 2019-06-12 DIAGNOSIS — R7401 Elevation of levels of liver transaminase levels: Secondary | ICD-10-CM

## 2019-06-12 DIAGNOSIS — N5082 Scrotal pain: Secondary | ICD-10-CM

## 2019-06-12 DIAGNOSIS — R778 Other specified abnormalities of plasma proteins: Secondary | ICD-10-CM

## 2019-06-12 DIAGNOSIS — J9601 Acute respiratory failure with hypoxia: Secondary | ICD-10-CM

## 2019-06-12 DIAGNOSIS — R079 Chest pain, unspecified: Secondary | ICD-10-CM

## 2019-06-12 LAB — NM MYOCAR MULTI W/SPECT W/WALL MOTION / EF
Estimated workload: 1 METS
Exercise duration (min): 0 min
Exercise duration (sec): 0 s
LV dias vol: 45 mL (ref 62–150)
LV sys vol: 15 mL
MPHR: 150 {beats}/min
Peak HR: 121 {beats}/min
Percent HR: 80 %
Rest HR: 90 {beats}/min
SDS: 0
SRS: 0
SSS: 0
TID: 0.82

## 2019-06-12 MED ORDER — PREDNISONE 10 MG PO TABS
ORAL_TABLET | ORAL | 0 refills | Status: AC
Start: 2019-06-13 — End: 2019-06-14

## 2019-06-12 MED ORDER — TECHNETIUM TC 99M TETROFOSMIN IV KIT
10.0000 | PACK | Freq: Once | INTRAVENOUS | Status: AC | PRN
  Administered 2019-06-12: 10.38 via INTRAVENOUS

## 2019-06-12 MED ORDER — LEVOFLOXACIN 500 MG PO TABS: 500.0000 mg | ORAL_TABLET | Freq: Every day | ORAL | Status: DC

## 2019-06-12 MED ORDER — LEVOFLOXACIN 500 MG PO TABS: 500.0000 mg | ORAL_TABLET | Freq: Every day | ORAL | 0 refills | Status: AC

## 2019-06-12 MED ORDER — TECHNETIUM TC 99M TETROFOSMIN IV KIT
29.8600 | PACK | Freq: Once | INTRAVENOUS | Status: AC | PRN
  Administered 2019-06-12: 29.86 via INTRAVENOUS

## 2019-06-12 MED ORDER — ASPIRIN 81 MG PO TBEC: 81.0000 mg | DELAYED_RELEASE_TABLET | Freq: Every day | ORAL | 0 refills | Status: DC

## 2019-06-12 MED ORDER — CLOPIDOGREL BISULFATE 75 MG PO TABS: 75.0000 mg | ORAL_TABLET | Freq: Every day | ORAL | 0 refills | Status: DC

## 2019-06-12 MED ORDER — REGADENOSON 0.4 MG/5ML IV SOLN
0.4000 mg | Freq: Once | INTRAVENOUS | Status: AC
  Administered 2019-06-12: 0.4 mg via INTRAVENOUS
  Filled 2019-06-12: qty 5

## 2019-06-12 MED ORDER — DILTIAZEM HCL ER COATED BEADS 120 MG PO CP24: 120.0000 mg | ORAL_CAPSULE | Freq: Every day | ORAL | 0 refills | Status: DC

## 2019-06-12 MED ORDER — ENSURE ENLIVE PO LIQD: 237.0000 mL | Freq: Two times a day (BID) | ORAL | 0 refills | Status: DC

## 2019-06-12 MED ORDER — ATORVASTATIN CALCIUM 40 MG PO TABS: 40.0000 mg | ORAL_TABLET | Freq: Every day | ORAL | 0 refills | Status: DC

## 2019-06-12 MED ORDER — MOMETASONE FURO-FORMOTEROL FUM 100-5 MCG/ACT IN AERO: 2.0000 | INHALATION_SPRAY | Freq: Two times a day (BID) | RESPIRATORY_TRACT | 0 refills | Status: DC

## 2019-06-12 MED ORDER — IPRATROPIUM-ALBUTEROL 0.5-2.5 (3) MG/3ML IN SOLN
3.0000 mL | Freq: Three times a day (TID) | RESPIRATORY_TRACT | Status: DC
  Administered 2019-06-12: 3 mL via RESPIRATORY_TRACT
  Filled 2019-06-12 (×2): qty 3

## 2019-06-12 NOTE — NC FL2 (Signed)
[place] LEVEL OF CARE SCREENING TOOL     IDENTIFICATION  Patient Name: Carlos Nelson Birthdate: 1951-01-06 Sex: male Admission Date (Current Location): 06/09/2019  [place] and [place] Number:  Engineering geologist and Address:  [facility], [address] [place], [place] [address]      Provider Number: [id number]  Attending Physician Name and Address:  Loletha Grayer, MD  Relative Name and Phone Number:  Kailor Holsonback [phone]    Current Level of Care: Hospital Recommended Level of Care: Miguel Barrera Prior Approval Number:    Date Approved/Denied:   PASRR Number:    Discharge Plan: Other (Comment)    Current Diagnoses: Patient Active Problem List   Diagnosis Date Noted  . Scrotum pain   . Demand ischemia ([place]) 06/11/2019  . Malnutrition of moderate degree 06/10/2019  . NSTEMI (non-ST elevated myocardial infarction) ([place]) 06/09/2019  . Depression with anxiety 06/09/2019  . Pleuritic chest pain 05/20/2019  . CAP (community acquired pneumonia) 05/20/2019  . Hypertension   . COPD exacerbation ([place])   . Acute respiratory failure with hypoxia ([place])   . Altered mental status 09/25/2018  . Schizophrenia ([place])   . Tachycardia 08/21/2018  . Schizophrenia, chronic with acute exacerbation ([place]) 08/17/2018  . Elevated PSA 05/24/2015    Orientation RESPIRATION BLADDER Height & Weight     Self, Situation  Normal Continent Weight: 54.6 kg Height:  5\' 4"  (162.6 cm)  BEHAVIORAL SYMPTOMS/MOOD NEUROLOGICAL BOWEL NUTRITION STATUS      Continent    AMBULATORY STATUS COMMUNICATION OF NEEDS Skin   Supervision Verbally Normal                       Personal Care Assistance Level of Assistance  Dressing, Bathing Bathing Assistance: Limited assistance   Dressing Assistance: Limited assistance     Functional Limitations Info             SPECIAL CARE FACTORS FREQUENCY                        Contractures Contractures Info: Not present    Additional Factors Info                  Current Medications (06/12/2019):  This is the current hospital active medication list Current Facility-Administered Medications  Medication Dose Route Frequency Provider Last Rate Last Admin  . acetaminophen (TYLENOL) tablet 650 mg  650 mg Oral Q6H PRN Oswald Hillock, RPH      . albuterol (PROVENTIL) (2.5 MG/3ML) 0.083% nebulizer solution 2.5 mg  2.5 mg Nebulization Q4H PRN Ivor Costa, MD      . aspirin EC tablet 81 mg  81 mg Oral Daily End, Christopher, MD   81 mg at 06/12/19 1037  . atorvastatin (LIPITOR) tablet 40 mg  40 mg Oral Daily Ivor Costa, MD   40 mg at 06/12/19 1036  . benazepril (LOTENSIN) tablet 20 mg  20 mg Oral Daily Ivor Costa, MD   20 mg at 06/12/19 1037  . clopidogrel (PLAVIX) tablet 75 mg  75 mg Oral Daily End, Christopher, MD   75 mg at 06/12/19 1037  . dextromethorphan-guaiFENesin (MUCINEX DM) 30-600 MG per 12 hr tablet 1 tablet  1 tablet Oral BID PRN Ivor Costa, MD   1 tablet at 06/09/19 1947  . diltiazem (CARDIZEM CD) 24 hr capsule 120 mg  120 mg Oral Daily Gollan, Kathlene November, MD  120 mg at 06/11/19 1643  . divalproex (DEPAKOTE ER) 24 hr tablet 500 mg  500 mg Oral QHS Ivor Costa, MD   500 mg at 06/11/19 2135  . donepezil (ARICEPT) tablet 5 mg  5 mg Oral QHS Ivor Costa, MD   5 mg at 06/11/19 2135  . feeding supplement (ENSURE ENLIVE) (ENSURE ENLIVE) liquid 237 mL  237 mL Oral BID BM Wieting, Richard, MD   237 mL at 06/11/19 1522  . haloperidol (HALDOL) tablet 2.5 mg  2.5 mg Oral Daily Ivor Costa, MD   2.5 mg at 06/12/19 1037  . hydrALAZINE (APRESOLINE) injection 5 mg  5 mg Intravenous Q2H PRN Ivor Costa, MD      . hydrOXYzine (ATARAX/VISTARIL) tablet 50 mg  50 mg Oral Q6H PRN Ivor Costa, MD      . ipratropium-albuterol (DUONEB) 0.5-2.5 (3) MG/3ML nebulizer solution 3 mL  3 mL Nebulization TID Loletha Grayer, MD   3 mL at 06/12/19 1434  . isosorbide mononitrate (IMDUR) 24  hr tablet 30 mg  30 mg Oral Daily Theora Gianotti, NP   30 mg at 06/12/19 1037  . levofloxacin (LEVAQUIN) tablet 500 mg  500 mg Oral Daily Wieting, Richard, MD      . LORazepam (ATIVAN) tablet 1 mg  1 mg Oral Q6H PRN Ivor Costa, MD   1 mg at 06/10/19 2238  . methylPREDNISolone sodium succinate (SOLU-MEDROL) 40 mg/mL injection 40 mg  40 mg Intravenous Q12H Ivor Costa, MD   40 mg at 06/12/19 0359  . morphine 2 MG/ML injection 1 mg  1 mg Intravenous Q4H PRN Ivor Costa, MD   1 mg at 06/11/19 1955  . nitroGLYCERIN (NITROSTAT) SL tablet 0.4 mg  0.4 mg Sublingual Q5 min PRN Ivor Costa, MD      . ondansetron [facility]) injection 4 mg  4 mg Intravenous Q8H PRN Ivor Costa, MD      . QUEtiapine (SEROQUEL) tablet 800 mg  800 mg Oral QHS Ivor Costa, MD   800 mg at 06/11/19 2135  . traZODone (DESYREL) tablet 300 mg  300 mg Oral QHS Ivor Costa, MD   300 mg at 06/11/19 2135  . trihexyphenidyl (ARTANE) tablet 5 mg  5 mg Oral TID WC Ivor Costa, MD   5 mg at 06/12/19 1037  . vitamin B-12 (CYANOCOBALAMIN) tablet 1,000 mcg  1,000 mcg Oral Daily Ivor Costa, MD   1,000 mcg at 06/12/19 1037     Discharge Medications: Please see discharge summary for a list of discharge medications.  Relevant Imaging Results:  Relevant Lab Results:   Additional Information Roseanne Reno, RN  Victorino Dike, RN

## 2019-06-12 NOTE — Discharge Summary (Addendum)
[facility] NAME: Carlos Nelson    MR#:  [id number]  DATE OF BIRTH:  [birth year]  DATE OF ADMISSION:  06/09/2019 ADMITTING PHYSICIAN: Ivor Costa, MD  DATE OF DISCHARGE: 06/12/2019  PRIMARY CARE PHYSICIAN: Jodi Marble, MD    ADMISSION DIAGNOSIS:  Hypoxia [R09.02] COPD exacerbation (HCC) [J44.1] NSTEMI (non-ST elevated myocardial infarction) ([place]) [I21.4] Non-ST elevation MI (NSTEMI) ([place]) [I21.4]  DISCHARGE DIAGNOSIS:  Principal Problem:   COPD exacerbation (HCC) Active Problems:   Schizophrenia ([place])   Hypertension   Acute on chronic respiratory failure with hypoxia ([place])   Depression with anxiety   Malnutrition of moderate degree   Demand ischemia ([place])   SECONDARY DIAGNOSIS:   Past Medical History:  Diagnosis Date  . Chronic respiratory failure with hypoxia ([place])   . COPD (chronic obstructive pulmonary disease) ([place])   . Hypertension   . Schizophrenia ([place])     HOSPITAL COURSE:   1.  Acute hypoxic respiratory failure secondary to COPD exacerbation.  Patient able to come off the oxygen and hold the saturations.  Patient was on Solu-Medrol nebulizer treatments during the hospital course and will be given inhalers and prednisone to go home with for a few more days.  Patient was advised to stop smoking. 2.  Elevated troponin likely demand ischemia with stress test negative.  Patient deferred invasive cardiac procedure.  Did agree to a stress test which was a low risk scan done on the day of discharge.  Patient on aspirin, Plavix and Lipitor.  No beta-blocker secondary to bronchospasm. 3.  Schizophrenia continue psychiatric medications 4.  Essential hypertension on Cardizem CD 5.  Moderate malnutrition on Ensure 6.  Weakness.  Physical therapy recommends home with home health 7.  Patient complains of little tooth pain and also pain in his testicles.  Scrotal ultrasound was negative for torsion or mass but they did  see a single calcification in the right testes which needs to be followed over time.  I did prescribe Levaquin just in case this is epididymitis.  This would also cover tooth infection.  Would also recommend getting to the dentist as outpatient.   DISCHARGE CONDITIONS:   Satisfactory  CONSULTS OBTAINED:  Cardiology  DRUG ALLERGIES:   Allergies  Allergen Reactions  . Ace Inhibitors Swelling    Ok with benazapril, per grouphome    DISCHARGE MEDICATIONS:   Allergies as of 06/12/2019      Reactions   Ace Inhibitors Swelling   Ok with benazapril, per grouphome      Medication List    STOP taking these medications   amLODipine-benazepril 5-20 MG capsule Commonly known as: LOTREL     TAKE these medications   Abilify Maintena 400 MG Prsy prefilled syringe Generic drug: ARIPiprazole ER Inject 400 mg into the muscle every 30 (thirty) days.   albuterol (2.5 MG/3ML) 0.083% nebulizer solution Commonly known as: PROVENTIL Take 2.5 mg by nebulization every 6 (six) hours as needed for wheezing or shortness of breath.   albuterol 108 (90 Base) MCG/ACT inhaler Commonly known as: VENTOLIN HFA Inhale 1-2 puffs into the lungs every 6 (six) hours as needed for wheezing or shortness of breath.   aspirin 81 MG EC tablet Take 1 tablet (81 mg total) by mouth daily. Start taking on: Jun 13, 2019   atorvastatin 40 MG tablet Commonly known as: LIPITOR Take 1 tablet (40 mg total) by mouth daily. Start taking on: Jun 13, 2019   clopidogrel 75  MG tablet Commonly known as: PLAVIX Take 1 tablet (75 mg total) by mouth daily. Start taking on: Jun 13, 2019   diltiazem 120 MG 24 hr capsule Commonly known as: CARDIZEM CD Take 1 capsule (120 mg total) by mouth daily.   divalproex 250 MG 24 hr tablet Commonly known as: DEPAKOTE ER Take 500 mg by mouth at bedtime.   donepezil 5 MG tablet Commonly known as: ARICEPT Take 5 mg by mouth at bedtime.   feeding supplement (ENSURE ENLIVE)  Liqd Take 237 mLs by mouth 2 (two) times daily between meals. Start taking on: Jun 13, 2019   haloperidol 5 MG tablet Commonly known as: HALDOL Take 2.5 mg by mouth daily.   hydrOXYzine 50 MG tablet Commonly known as: ATARAX/VISTARIL Take 50 mg by mouth every 6 (six) hours as needed for anxiety or itching.   levofloxacin 500 MG tablet Commonly known as: LEVAQUIN Take 1 tablet (500 mg total) by mouth daily for 6 days. Start taking on: Jun 13, 2019   LORazepam 1 MG tablet Commonly known as: ATIVAN Take 1 tablet (1 mg total) by mouth every 6 (six) hours as needed for anxiety.   mometasone-formoterol 100-5 MCG/ACT Aero Commonly known as: DULERA Inhale 2 puffs into the lungs in the morning and at bedtime.   predniSONE 10 MG tablet Commonly known as: DELTASONE 4 tabs po daily for two days Start taking on: Jun 13, 2019   QUEtiapine 400 MG tablet Commonly known as: SEROQUEL Take 800 mg by mouth at bedtime.   traZODone 150 MG tablet Commonly known as: DESYREL Take 300 mg by mouth at bedtime.   trihexyphenidyl 5 MG tablet Commonly known as: ARTANE Take 1 tablet (5 mg total) by mouth 3 (three) times daily with meals.   vitamin B-12 1000 MCG tablet Commonly known as: CYANOCOBALAMIN Take 1,000 mcg by mouth daily.        DISCHARGE INSTRUCTIONS:   Follow-up with PMD 5 days Follow-up cardiology 1 week  If you experience worsening of your admission symptoms, develop shortness of breath, life threatening emergency, suicidal or homicidal thoughts you must seek medical attention immediately by calling 911 or calling your MD immediately  if symptoms less severe.  You Must read complete instructions/literature along with all the possible adverse reactions/side effects for all the Medicines you take and that have been prescribed to you. Take any new Medicines after you have completely understood and accept all the possible adverse reactions/side effects.   Please note  You were  cared for by a hospitalist during your hospital stay. If you have any questions about your discharge medications or the care you received while you were in the hospital after you are discharged, you can call the unit and asked to speak with the hospitalist on call if the hospitalist that took care of you is not available. Once you are discharged, your primary care physician will handle any further medical issues. Please note that NO REFILLS for any discharge medications will be authorized once you are discharged, as it is imperative that you return to your primary care physician (or establish a relationship with a primary care physician if you do not have one) for your aftercare needs so that they can reassess your need for medications and monitor your lab values.    Today   CHIEF COMPLAINT:   Chief Complaint  Patient presents with  . Shortness of Breath    HISTORY OF PRESENT ILLNESS:  Carlos Nelson  is a 69 y.o.  male came in with shortness of breath   VITAL SIGNS:  Blood pressure 113/67, pulse (!) 104, temperature 98 F (36.7 C), resp. rate 20, height 5\' 4"  (1.626 m), weight 54.6 kg, SpO2 94 %.  I/O:    Intake/Output Summary (Last 24 hours) at 06/12/2019 1508 Last data filed at 06/12/2019 1500 Gross per 24 hour  Intake 480 ml  Output 700 ml  Net -220 ml    PHYSICAL EXAMINATION:  GENERAL:  69 y.o.-year-old patient lying in the bed with no acute distress.  EYES: Pupils equal, round, reactive to light and accommodation. No scleral icterus. Extraocular muscles intact.  HEENT: Head atraumatic, normocephalic. Oropharynx and nasopharynx clear.  NECK:  Supple, no jugular venous distention. No thyroid enlargement, no tenderness.  LUNGS: Decreased breath sounds bilaterally, no wheezing, rales,rhonchi or crepitation. No use of accessory muscles of respiration.  CARDIOVASCULAR: S1, S2 normal. No murmurs, rubs, or gallops.  ABDOMEN: Soft, non-tender, non-distended. Bowel sounds present. No  organomegaly or mass.  EXTREMITIES: No pedal edema, cyanosis, or clubbing.  NEUROLOGIC: Cranial nerves II through XII are intact. Muscle strength 5/5 in all extremities. Sensation intact. Gait not checked.  PSYCHIATRIC: The patient is alert and oriented x 3.  SKIN: No obvious rash, lesion, or ulcer.   DATA REVIEW:   CBC Recent Labs  Lab 06/11/19 0523  WBC 9.0  HGB 12.1*  HCT 35.9*  PLT 203    Chemistries  Recent Labs  Lab 06/11/19 0523  NA 138  K 4.3  CL 104  CO2 27  GLUCOSE 107*  BUN 15  CREATININE 0.87  CALCIUM 9.0  AST 18  ALT 24  ALKPHOS 54  BILITOT 0.6    Cardiac Enzymes High-sensitivity troponin peaked at 888.  Came down to 330.  Microbiology Results  Results for orders placed or performed during the hospital encounter of 06/09/19  SARS Coronavirus 2 by RT PCR (hospital order, performed in [facility] hospital lab) Nasopharyngeal Nasopharyngeal Swab     Status: None   Collection Time: 06/09/19  3:08 AM   Specimen: Nasopharyngeal Swab  Result Value Ref Range Status   SARS Coronavirus 2 NEGATIVE NEGATIVE Final    Comment: (NOTE) SARS-CoV-2 target nucleic acids are NOT DETECTED. The SARS-CoV-2 RNA is generally detectable in upper and lower respiratory specimens during the acute phase of infection. The lowest concentration of SARS-CoV-2 viral copies this assay can detect is 250 copies / mL. A negative result does not preclude SARS-CoV-2 infection and should not be used as the sole basis for treatment or other patient management decisions.  A negative result may occur with improper specimen collection / handling, submission of specimen other than nasopharyngeal swab, presence of viral mutation(s) within the areas targeted by this assay, and inadequate number of viral copies (<250 copies / mL). A negative result must be combined with clinical observations, patient history, and epidemiological information. Fact Sheet for Patients:    StrictlyIdeas.no Fact Sheet for Healthcare Providers: BankingDealers.co.za This test is not yet approved or cleared  by the [place] FDA and has been authorized for detection and/or diagnosis of SARS-CoV-2 by FDA under an Emergency Use Authorization (EUA).  This EUA will remain in effect (meaning this test can be used) for the duration of the COVID-19 declaration under Section 564(b)(1) of the Act, 21 U.S.C. section 360bbb-3(b)(1), unless the authorization is terminated or revoked sooner. Performed at [facility], [address] [place] [employer], [place] [address]     RADIOLOGY:  NM Myocar Multi W/Spect  W/Wall Motion / EF  Result Date: 06/12/2019 Pharmacological myocardial perfusion imaging study with no significant  ischemia Normal wall motion, EF estimated at 63% No EKG changes concerning for ischemia at peak stress or in recovery. CT attenuation correction images no significant coronary calcifucation, no aortic atherosclerosis. Low risk scan Signed, Esmond Plants, MD, Ph.D [facility]   US SCROTUM W/DOPPLER  Result Date: 06/12/2019 CLINICAL DATA:  Scrotal pain for 4 days EXAM: SCROTAL ULTRASOUND DOPPLER ULTRASOUND OF THE TESTICLES TECHNIQUE: Complete ultrasound examination of the testicles, epididymis, and other scrotal structures was performed. Color and spectral Doppler ultrasound were also utilized to evaluate blood flow to the testicles. COMPARISON:  None FINDINGS: Right testicle Measurements: 5.0 x 3.0 x 3.0 cm. Single small microcalcification noted. No mass. Left testicle Measurements: 5.0 x 10.3 x 2.7 cm. No mass or microlithiasis visualized. Right epididymis:  Normal in size and appearance. Left epididymis:  Normal in size and appearance. Hydrocele:  Small bilateral hydroceles. Varicocele:  None visualized. Pulsed Doppler interrogation of both testes demonstrates normal low resistance arterial and venous waveforms bilaterally.  IMPRESSION: 1. No testicular torsion or mass. 2. Single calcification within the right testis noted. Current literature suggests that testicular microlithiasis is not a significant independent risk factor for development of testicular carcinoma, and that follow up imaging is not warranted in the absence of other risk factors. Monthly testicular self-examination and annual physical exams are considered appropriate surveillance. If patient has other risk factors for testicular carcinoma, then referral to Urology should be considered. (Reference: DeCastro, et al.: A 5-Year Follow up Study of Asymptomatic Men with Testicular Microlithiasis. J Urol 2008; [id number].) 3. Small bilateral hydroceles. Electronically Signed   By: Kerby Moors M.D.   On: 06/12/2019 11:43     Management plans discussed with the patient, family and they are in agreement.  CODE STATUS:     Code Status Orders  (From admission, onward)         Start     Ordered   06/09/19 1009  Full code  Continuous     06/09/19 1008        Code Status History    Date Active Date Inactive Code Status Order ID Comments User Context   05/24/2019 0018 05/25/2019 1827 Full Code [id number]  Jenkins Rouge, MD ED   05/20/2019 0827 05/22/2019 2049 Full Code [id number]  Ivor Costa, MD ED   08/21/2018 0446 08/22/2018 2048 Full Code [id number]  Harrie Foreman, MD Inpatient   Advance Care Planning Activity      TOTAL TIME TAKING CARE OF THIS PATIENT: 35 minutes.    Loletha Grayer M.D on 06/12/2019 at 3:08 PM  Between 7am to 6pm - Pager - [phone]  After 6pm go to [email or web address] - password [email or web address]  [facility]  CC: Primary care physician; Jodi Marble, MD

## 2019-06-12 NOTE — Progress Notes (Signed)
Patient weaned to room air. O2 sats 93% at rest. Ambulated around nurses station x1 with standby assistance. O2 sats 91% with exertion. Patient reported shortness of breath and stated he felt fatigued. MD made aware.

## 2019-06-12 NOTE — TOC Progression Note (Signed)
Transition of Care [facility]) - Progression Note    Patient Details  Name: Carlos Nelson MRN: [id number] Date of Birth: 1950/03/04  Transition of Care [facility]) CM/SW [place], RN Phone Number: 06/12/2019, 1:50 PM       Barriers to Discharge: Continued Medical Work up  Expected Discharge Plan and Services                                     HH Arranged: PT [place]: [place] Date [place]: 06/12/19 Time [place]: [id number] Representative spoke with at [place]: [place] ([place]) Interventions    Readmission Risk Interventions No flowsheet data found.

## 2019-06-12 NOTE — Progress Notes (Signed)
Iv and tele removed. Discharge instructions given to patient. Verbalized understanding. Patient to be transport back to group home. Packet prepared for group home as well which includes FL2, discharge summary and AVS. Patient stated to this RN that he may have to come back to the hospital, when asked why he stated that he felt weak. Checked O2 sats, 94% on room air at rest. Md notified. Patient medically stable for d/c. Patient encouraged to increase activity gradually and to take medications as prescribed.

## 2019-06-12 NOTE — Care Management Important Message (Signed)
Important Message  Patient Details  Name: Carlos Nelson MRN: [id number] Date of Birth: July 21, 1950   Medicare Important Message Given:  Yes     Dannette Barbara 06/12/2019, 2:56 PM

## 2019-06-12 NOTE — Progress Notes (Signed)
Progress Note  Patient Name: Carlos Nelson Date of Encounter: 06/12/2019  Primary Cardiologist: Nelva Bush, MD  Subjective   Still w/ pleuritic c/p and chest wall soreness.  Says it feels like "someone is eating [him]."  Inpatient Medications    Scheduled Meds: . aspirin EC  81 mg Oral Daily  . atorvastatin  40 mg Oral Daily  . benazepril  20 mg Oral Daily  . clopidogrel  75 mg Oral Daily  . diltiazem  120 mg Oral Daily  . divalproex  500 mg Oral QHS  . donepezil  5 mg Oral QHS  . feeding supplement (ENSURE ENLIVE)  237 mL Oral BID BM  . haloperidol  2.5 mg Oral Daily  . ipratropium-albuterol  3 mL Nebulization TID  . isosorbide mononitrate  30 mg Oral Daily  . methylPREDNISolone (SOLU-MEDROL) injection  40 mg Intravenous Q12H  . QUEtiapine  800 mg Oral QHS  . traZODone  300 mg Oral QHS  . trihexyphenidyl  5 mg Oral TID WC  . vitamin B-12  1,000 mcg Oral Daily   Continuous Infusions:  PRN Meds: acetaminophen, albuterol, dextromethorphan-guaiFENesin, hydrALAZINE, hydrOXYzine, LORazepam, morphine injection, nitroGLYCERIN, ondansetron (ZOFRAN) IV   Vital Signs    Vitals:   06/11/19 1549 06/11/19 1952 06/11/19 2020 06/12/19 0641  BP: 109/67 130/78  113/67  Pulse: (!) 110 (!) 115  95  Resp: 18 16  17   Temp: 98.2 F (36.8 C) 98.1 F (36.7 C)  98 F (36.7 C)  TempSrc:  Oral    SpO2: 100% 97% 97% 90%  Weight:    54.6 kg  Height:        Intake/Output Summary (Last 24 hours) at 06/12/2019 0844 Last data filed at 06/11/2019 1850 Gross per 24 hour  Intake 840 ml  Output 1000 ml  Net -160 ml   Filed Weights   06/10/19 0445 06/11/19 0255 06/12/19 0641  Weight: 55 kg 54.7 kg 54.6 kg    Physical Exam   GEN: Well nourished, well developed, in no acute distress.  HEENT: Grossly normal. Poor dentition.  Difficult to comprehend speech. Neck: Supple, no JVD, carotid bruits, or masses. Cardiac: RRR, no murmurs, rubs, or gallops. No clubbing, cyanosis, edema.   Radials/DP/PT 2+ and equal bilaterally.  Respiratory:  Respirations regular and unlabored, markedly diminished breath sounds bilat w/ faint exp wheezing. GI: Soft, nontender, nondistended, BS + x 4. MS: no deformity or atrophy. Skin: warm and dry, no rash. Neuro:  Strength and sensation are intact. Psych: AAOx3.  Normal affect.  Labs    Chemistry Recent Labs  Lab 06/09/19 0307 06/10/19 0517 06/11/19 0523  NA 142 141 138  K 4.0 3.9 4.3  CL 105 106 104  CO2 31 27 27   GLUCOSE 94 105* 107*  BUN 12 15 15   CREATININE 1.06 0.95 0.87  CALCIUM 9.0 9.2 9.0  PROT  --   --  5.7*  ALBUMIN  --   --  3.1*  AST  --   --  18  ALT  --   --  24  ALKPHOS  --   --  54  BILITOT  --   --  0.6  GFRNONAA >60 >60 >60  GFRAA >60 >60 >60  ANIONGAP 6 8 7      Hematology Recent Labs  Lab 06/09/19 0307 06/10/19 0517 06/11/19 0523  WBC 7.5 7.6 9.0  RBC 4.40 4.46 3.97*  HGB 13.2 13.4 12.1*  HCT 40.4 40.4 35.9*  MCV 91.8 90.6 90.4  MCH 30.0 30.0 30.5  MCHC 32.7 33.2 33.7  RDW 13.4 13.2 13.3  PLT 202 197 203    Cardiac Enzymes  Recent Labs  Lab 06/09/19 0307 06/09/19 0508 06/09/19 0842 06/09/19 1155 06/09/19 1358  TROPONINIHS 835* 888* 494* 411* 330*      Radiology    DG Chest Port 1 View  Result Date: 06/09/2019 CLINICAL DATA:  Shortness of breath EXAM: PORTABLE CHEST 1 VIEW COMPARISON:  Radiograph 05/23/2019 FINDINGS: No consolidation, features of edema, pneumothorax, or effusion. Pulmonary vascularity is normally distributed. The cardiomediastinal contours are unremarkable. No acute osseous or soft tissue abnormality. Degenerative changes are present in the imaged spine and shoulders. Telemetry leads overlie the chest. IMPRESSION: No acute cardiopulmonary abnormality. Electronically Signed   By: Lovena Le M.D.   On: 06/09/2019 03:41   Telemetry    Seen in nuc med - sinus rhythm - Personally Reviewed  Cardiac Studies   2D Echocardiogram5.25.2021  1. Left ventricular  ejection fraction, by estimation, is 60 to 65%. The  left ventricle has normal function. Left ventricular endocardial border  not optimally defined to evaluate regional wall motion. There is mild left  ventricular hypertrophy. Left  ventricular diastolic function could not be evaluated.  2. Right ventricular systolic function is normal. The right ventricular  size is normal. Mildly increased right ventricular wall thickness.  Tricuspid regurgitation signal is inadequate for assessing PA pressure.  3. The mitral valve is grossly normal. Unable to accurately assess mitral  valve regurgitation.  4. The aortic valve is tricuspid. Aortic valve regurgitation is not  well-assessed. Aortic stenosis is not well-assessed.  5. The inferior vena cava is normal in size with greater than 50%  respiratory variability, suggesting right atrial pressure of 3 mmHg.   Patient Profile     69 y.o.malewith a hx of schizophrenia, COPD, anxiety, and HTN, who was admitted 5/25 w/ dyspnea and COPD exacerbation and found to have an elevated HsTrop (peak 888). Echo w/ nl EF.  Assessment & Plan    1.  Demand Ischemia:  Admitted to [facility] 5/25 w/ worsening dyspena/COPD exacerbation and chest tightness.  ECG w/o acute changes, while HsTrop initally 835, peaked @ 888, and has been downtrending since.  Offered cath but refused.  Echo 5/25 w/ nl EF and w/o significant valvular abnormalities.  He has cont to have mild, diffuse, chest wall pain and soreness that is worse w/ palpation and deep breathing.  He is agreeable to stress testing this AM.  Heparin d/c'd yesterday after 48hrs.  Cont ASA, plavix, high potency statin, and nitrate.  No  blocker in setting of copd/wheezing.  If stress markedly abnl, will need to reconsider cath.  2.  AECOPD:  Likely driving demand ischemia.  Poor air mvmt w/ ongoing faint exp wheezing.  Steroids/nebs per IM.  3.  Essential HTN:  BP stable this AM on ccb and acei.    4.  Sinus  tachycardia:  In setting of above.  Noted yesterday afternoon.  CCB changed from amlodipine to dilt.  Rate 90 this AM.    5.  Tob Abuse: cessation advised.  He is not planning to quit.  6.  Schizophrenia:  Stable.  Per IM.  7.  Lipids:  LDL 57 on statin.  ALT wnl.  Signed, Murray Hodgkins, NP  06/12/2019, 8:44 AM    For questions or updates, please contact   Please consult [email or web address] for contact info under Cardiology/STEMI.

## 2019-06-12 NOTE — Discharge Instructions (Signed)

## 2019-06-19 ENCOUNTER — Other Ambulatory Visit: Payer: Self-pay

## 2019-06-23 ENCOUNTER — Emergency Department: Payer: Medicare Other

## 2019-06-23 ENCOUNTER — Other Ambulatory Visit: Payer: Self-pay

## 2019-06-23 ENCOUNTER — Emergency Department
Admission: EM | Admit: 2019-06-23 | Discharge: 2019-06-23 | Disposition: A | Discharge status: Home / Self Care | Payer: Medicare Other | Attending: Student in an Organized Health Care Education/Training Program | Admitting: Student in an Organized Health Care Education/Training Program

## 2019-06-23 ENCOUNTER — Encounter: Payer: Self-pay | Admitting: Emergency Medicine

## 2019-06-23 DIAGNOSIS — I10 Essential (primary) hypertension: Secondary | ICD-10-CM | POA: Insufficient documentation

## 2019-06-23 DIAGNOSIS — Z79899 Other long term (current) drug therapy: Secondary | ICD-10-CM | POA: Insufficient documentation

## 2019-06-23 DIAGNOSIS — J449 Chronic obstructive pulmonary disease, unspecified: Secondary | ICD-10-CM | POA: Diagnosis not present

## 2019-06-23 DIAGNOSIS — R102 Pelvic and perineal pain: Secondary | ICD-10-CM | POA: Insufficient documentation

## 2019-06-23 DIAGNOSIS — Z87891 Personal history of nicotine dependence: Secondary | ICD-10-CM | POA: Diagnosis not present

## 2019-06-23 DIAGNOSIS — M25511 Pain in right shoulder: Secondary | ICD-10-CM | POA: Insufficient documentation

## 2019-06-23 DIAGNOSIS — R0602 Shortness of breath: Secondary | ICD-10-CM | POA: Diagnosis present

## 2019-06-23 DIAGNOSIS — Z7902 Long term (current) use of antithrombotics/antiplatelets: Secondary | ICD-10-CM | POA: Diagnosis not present

## 2019-06-23 DIAGNOSIS — M25512 Pain in left shoulder: Secondary | ICD-10-CM | POA: Insufficient documentation

## 2019-06-23 LAB — COMPREHENSIVE METABOLIC PANEL
ALT: 49 U/L — ABNORMAL HIGH (ref 0–44)
AST: 40 U/L (ref 15–41)
Albumin: 4.2 g/dL (ref 3.5–5.0)
Alkaline Phosphatase: 73 U/L (ref 38–126)
Anion gap: 7 (ref 5–15)
BUN: 14 mg/dL (ref 8–23)
CO2: 26 mmol/L (ref 22–32)
Calcium: 9.7 mg/dL (ref 8.9–10.3)
Chloride: 107 mmol/L (ref 98–111)
Creatinine, Ser: 1.01 mg/dL (ref 0.61–1.24)
GFR calc Af Amer: >60 mL/min (ref >60)
GFR calc non Af Amer: >60 mL/min (ref >60)
Glucose, Bld: 106 mg/dL — ABNORMAL HIGH (ref 70–99)
Potassium: 3.9 mmol/L (ref 3.5–5.1)
Sodium: 140 mmol/L (ref 135–145)
Total Bilirubin: 0.6 mg/dL (ref 0.3–1.2)
Total Protein: 7.2 g/dL (ref 6.5–8.1)

## 2019-06-23 LAB — CBC WITH DIFFERENTIAL/PLATELET
Abs Immature Granulocytes: 0.04 10*3/uL (ref 0.00–0.07)
Basophils Absolute: 0 10*3/uL (ref 0.0–0.1)
Basophils Relative: 0 %
Eosinophils Absolute: 0.1 10*3/uL (ref 0.0–0.5)
Eosinophils Relative: 1 %
HCT: 44.4 % (ref 39.0–52.0)
Hemoglobin: 14.9 g/dL (ref 13.0–17.0)
Immature Granulocytes: 0 %
Lymphocytes Relative: 28 %
Lymphs Abs: 2.9 10*3/uL (ref 0.7–4.0)
MCH: 30.1 pg (ref 26.0–34.0)
MCHC: 33.6 g/dL (ref 30.0–36.0)
MCV: 89.7 fL (ref 80.0–100.0)
Monocytes Absolute: 1 10*3/uL (ref 0.1–1.0)
Monocytes Relative: 10 %
Neutro Abs: 6.3 10*3/uL (ref 1.7–7.7)
Neutrophils Relative %: 61 %
Platelets: 225 10*3/uL (ref 150–400)
RBC: 4.95 MIL/uL (ref 4.22–5.81)
RDW: 13.5 % (ref 11.5–15.5)
WBC: 10.4 10*3/uL (ref 4.0–10.5)
nRBC: 0 % (ref 0.0–0.2)

## 2019-06-23 LAB — FIBRIN DERIVATIVES D-DIMER (ARMC ONLY): Fibrin derivatives D-dimer (ARMC): 532.21 ng/mL (FEU) — ABNORMAL HIGH (ref 0.00–499.00)

## 2019-06-23 LAB — TROPONIN I (HIGH SENSITIVITY)
Troponin I (High Sensitivity): 13 ng/L (ref <18)
Troponin I (High Sensitivity): 12 ng/L (ref <18)

## 2019-06-23 MED ORDER — OXYCODONE-ACETAMINOPHEN 5-325 MG PO TABS
1.0000 | ORAL_TABLET | Freq: Once | ORAL | Status: AC
  Administered 2019-06-23: 1 via ORAL
  Filled 2019-06-23: qty 1

## 2019-06-23 MED ORDER — IPRATROPIUM-ALBUTEROL 0.5-2.5 (3) MG/3ML IN SOLN
3.0000 mL | Freq: Once | RESPIRATORY_TRACT | Status: AC
  Administered 2019-06-23: 3 mL via RESPIRATORY_TRACT
  Filled 2019-06-23: qty 3

## 2019-06-23 MED ORDER — PREDNISONE 20 MG PO TABS: 20.0000 mg | ORAL_TABLET | Freq: Every day | ORAL | 0 refills | Status: AC

## 2019-06-23 MED ORDER — PREDNISONE 20 MG PO TABS
60.0000 mg | ORAL_TABLET | Freq: Once | ORAL | Status: AC
  Administered 2019-06-23: 60 mg via ORAL
  Filled 2019-06-23: qty 3

## 2019-06-23 MED ORDER — DILTIAZEM HCL ER COATED BEADS 120 MG PO CP24
120.0000 mg | ORAL_CAPSULE | Freq: Once | ORAL | Status: AC
  Administered 2019-06-23: 120 mg via ORAL
  Filled 2019-06-23: qty 1

## 2019-06-23 MED ORDER — AMLODIPINE BESYLATE 5 MG PO TABS
5.0000 mg | ORAL_TABLET | Freq: Once | ORAL | Status: AC
  Administered 2019-06-23: 5 mg via ORAL
  Filled 2019-06-23: qty 1

## 2019-06-23 MED ORDER — IOHEXOL 350 MG/ML SOLN
75.0000 mL | Freq: Once | INTRAVENOUS | Status: AC | PRN
  Administered 2019-06-23: 75 mL via INTRAVENOUS

## 2019-06-23 MED ORDER — ALBUTEROL SULFATE (2.5 MG/3ML) 0.083% IN NEBU: 2.5000 mg | INHALATION_SOLUTION | Freq: Four times a day (QID) | RESPIRATORY_TRACT | 1 refills | Status: DC | PRN

## 2019-06-23 NOTE — ED Triage Notes (Signed)
Patient ambulatory to triage with steady gait, without difficulty or distress noted, mask in place; EMS brought pt in from [facility] group home in [place]; pt reports upper back/shoulder pain x 2 hrs accomp by [facility]

## 2019-06-23 NOTE — ED Notes (Signed)
Patient given breakfast tray.

## 2019-06-23 NOTE — ED Provider Notes (Signed)
[facility] Emergency Department Provider Note    First MD Initiated Contact with Patient 06/23/19 [phone]     (approximate)  I have reviewed the triage vital signs and the nursing notes.   HISTORY  Chief Complaint Shortness of Breath    HPI Carlos Nelson is a 69 y.o. male below listed past medical history presents to the ER for shortness of breath bilateral shoulder pain starting early this morning.  Does endorse smoking marijuana last night denies any alcohol use.  Did not take any of his medications yesterday evening or this morning.  Does not have any fevers.  Does have a history of COPD.  Does not wear home oxygen.  Denies any abdominal pain nausea or vomiting.  No diarrhea.  No belching.  Denies any pain shooting or radiating through to his back.    Echo: 06/09/19:  1. Left ventricular ejection fraction, by estimation, is 60 to 65%. The  left ventricle has normal function. Left ventricular endocardial border  not optimally defined to evaluate regional wall motion. There is mild left  ventricular hypertrophy. Left  ventricular diastolic function could not be evaluated.  2. Right ventricular systolic function is normal. The right ventricular  size is normal. Mildly increased right ventricular wall thickness.  Tricuspid regurgitation signal is inadequate for assessing PA pressure.  3. The mitral valve is grossly normal. Unable to accurately assess mitral  valve regurgitation.  4. The aortic valve is tricuspid. Aortic valve regurgitation is not  well-assessed. Aortic stenosis is not well-assessed.  5. The inferior vena cava is normal in size with greater than 50%  respiratory variability, suggesting right atrial pressure of 3 mmHg.   Past Medical History:  Diagnosis Date  . Chronic respiratory failure with hypoxia ([place])   . COPD (chronic obstructive pulmonary disease) ([place])   . Hypertension   . Schizophrenia ([place])    Family History    Problem Relation Age of Onset  . Prostate cancer Neg Hx   . Bladder Cancer Neg Hx    Past Surgical History:  Procedure Laterality Date  . COLONOSCOPY    . COLONOSCOPY WITH PROPOFOL N/A 12/13/2016   Procedure: COLONOSCOPY WITH PROPOFOL;  Surgeon: Lollie Sails, MD;  Location: [facility] ENDOSCOPY;  Service: Endoscopy;  Laterality: N/A;  . ESOPHAGOGASTRODUODENOSCOPY (EGD) WITH PROPOFOL N/A 12/13/2016   Procedure: ESOPHAGOGASTRODUODENOSCOPY (EGD) WITH PROPOFOL;  Surgeon: Lollie Sails, MD;  Location: [facility] ENDOSCOPY;  Service: Endoscopy;  Laterality: N/A;  . left arm surgery     Patient Active Problem List   Diagnosis Date Noted  . Scrotum pain   . Elevated troponin   . Demand ischemia ([place]) 06/11/2019  . Malnutrition of moderate degree 06/10/2019  . NSTEMI (non-ST elevated myocardial infarction) ([place]) 06/09/2019  . Depression with anxiety 06/09/2019  . Pleuritic chest pain 05/20/2019  . CAP (community acquired pneumonia) 05/20/2019  . Hypertension   . COPD exacerbation ([place])   . Acute respiratory failure with hypoxia ([place])   . Altered mental status 09/25/2018  . Schizophrenia ([place])   . Tachycardia 08/21/2018  . Schizophrenia, chronic with acute exacerbation ([place]) 08/17/2018  . Elevated PSA 05/24/2015      Prior to Admission medications   Medication Sig Start Date End Date Taking? Authorizing Provider  albuterol (PROVENTIL) (2.5 MG/3ML) 0.083% nebulizer solution Take 3 mLs (2.5 mg total) by nebulization every 6 (six) hours as needed for wheezing or shortness of breath. 06/23/19   Merlyn Lot, MD  albuterol (VENTOLIN HFA) 108 (  90 Base) MCG/ACT inhaler Inhale 1-2 puffs into the lungs every 6 (six) hours as needed for wheezing or shortness of breath.    [provider]  ARIPiprazole ER (ABILIFY MAINTENA) 400 MG PRSY prefilled syringe Inject 400 mg into the muscle every 30 (thirty) days.    [provider]  aspirin EC 81 MG EC tablet Take 1 tablet (81 mg  total) by mouth daily. 06/13/19   Loletha Grayer, MD  atorvastatin (LIPITOR) 40 MG tablet Take 1 tablet (40 mg total) by mouth daily. 06/13/19   Loletha Grayer, MD  clopidogrel (PLAVIX) 75 MG tablet Take 1 tablet (75 mg total) by mouth daily. 06/13/19   Loletha Grayer, MD  diltiazem (CARDIZEM CD) 120 MG 24 hr capsule Take 1 capsule (120 mg total) by mouth daily. 06/12/19   Loletha Grayer, MD  divalproex (DEPAKOTE ER) 250 MG 24 hr tablet Take 500 mg by mouth at bedtime.    [provider]  donepezil (ARICEPT) 5 MG tablet Take 5 mg by mouth at bedtime.    [provider]  feeding supplement, ENSURE ENLIVE, (ENSURE ENLIVE) LIQD Take 237 mLs by mouth 2 (two) times daily between meals. 06/13/19   Loletha Grayer, MD  haloperidol (HALDOL) 5 MG tablet Take 2.5 mg by mouth daily.    [provider]  hydrOXYzine (ATARAX/VISTARIL) 50 MG tablet Take 50 mg by mouth every 6 (six) hours as needed for anxiety or itching.    [provider]  LORazepam (ATIVAN) 1 MG tablet Take 1 tablet (1 mg total) by mouth every 6 (six) hours as needed for anxiety. 08/22/18   Gouru, Illene Silver, MD  mometasone-formoterol (DULERA) 100-5 MCG/ACT AERO Inhale 2 puffs into the lungs in the morning and at bedtime. 06/12/19   Loletha Grayer, MD  predniSONE (DELTASONE) 20 MG tablet Take 1 tablet (20 mg total) by mouth daily for 4 days. 06/23/19 06/27/19  Merlyn Lot, MD  QUEtiapine (SEROQUEL) 400 MG tablet Take 800 mg by mouth at bedtime.    [provider]  traZODone (DESYREL) 150 MG tablet Take 300 mg by mouth at bedtime.     [provider]  trihexyphenidyl (ARTANE) 5 MG tablet Take 1 tablet (5 mg total) by mouth 3 (three) times daily with meals. 05/22/19   Lorella Nimrod, MD  vitamin B-12 (CYANOCOBALAMIN) 1000 MCG tablet Take 1,000 mcg by mouth daily.    [provider]    Allergies Ace inhibitors    Social History Social History   Tobacco Use  . Smoking status:  Former Smoker    Packs/day: 1.00    Types: Cigarettes  . Smokeless tobacco: Never Used  Substance Use Topics  . Alcohol use: Yes    Alcohol/week: 0.0 standard drinks    Comment: beer  . Drug use: Yes    Types: Marijuana    Comment: marjuana    Review of Systems Patient denies headaches, rhinorrhea, blurry vision, numbness, shortness of breath, chest pain, edema, cough, abdominal pain, nausea, vomiting, diarrhea, dysuria, fevers, rashes or hallucinations unless otherwise stated above in HPI. ____________________________________________   PHYSICAL EXAM:  VITAL SIGNS: Vitals:   06/23/19 1130 06/23/19 1230  BP: (!) 160/94 (!) 164/96  Pulse: 88 84  Resp: 19 18  Temp:    SpO2: 93% 94%    Constitutional: Alert and oriented.  Eyes: Conjunctivae are normal.  Head: Atraumatic. Nose: No congestion/rhinnorhea. Mouth/Throat: Mucous membranes are moist.   Neck: No stridor. Painless ROM.  Cardiovascular: Normal rate, regular rhythm. Grossly  normal heart sounds.  Good peripheral circulation. Respiratory: Mild tachypnea with diminished breath sounds throughout. Gastrointestinal: Soft and nontender. No distention. No abdominal bruits. No CVA tenderness. Genitourinary:  Musculoskeletal: No lower extremity tenderness nor edema.  No joint effusions. Neurologic:  Normal speech and language. No gross focal neurologic deficits are appreciated. No facial droop Skin:  Skin is warm, dry and intact. No rash noted. Psychiatric: Mood and affect are normal. Speech and behavior are normal.  ____________________________________________   LABS (all labs ordered are listed, but only abnormal results are displayed)  Results for orders placed or performed during the hospital encounter of 06/23/19 (from the past 24 hour(s))  CBC with Differential     Status: None   Collection Time: 06/23/19  4:10 AM  Result Value Ref Range   WBC 10.4 4.0 - 10.5 K/uL   RBC 4.95 4.22 - 5.81 MIL/uL   Hemoglobin 14.9  13.0 - 17.0 g/dL   HCT 44.4 39.0 - 52.0 %   MCV 89.7 80.0 - 100.0 fL   MCH 30.1 26.0 - 34.0 pg   MCHC 33.6 30.0 - 36.0 g/dL   RDW 13.5 11.5 - 15.5 %   Platelets 225 150 - 400 K/uL   nRBC 0.0 0.0 - 0.2 %   Neutrophils Relative % 61 %   Neutro Abs 6.3 1.7 - 7.7 K/uL   Lymphocytes Relative 28 %   Lymphs Abs 2.9 0.7 - 4.0 K/uL   Monocytes Relative 10 %   Monocytes Absolute 1.0 0.1 - 1.0 K/uL   Eosinophils Relative 1 %   Eosinophils Absolute 0.1 0.0 - 0.5 K/uL   Basophils Relative 0 %   Basophils Absolute 0.0 0.0 - 0.1 K/uL   Immature Granulocytes 0 %   Abs Immature Granulocytes 0.04 0.00 - 0.07 K/uL  Comprehensive metabolic panel     Status: Abnormal   Collection Time: 06/23/19  4:10 AM  Result Value Ref Range   Sodium 140 135 - 145 mmol/L   Potassium 3.9 3.5 - 5.1 mmol/L   Chloride 107 98 - 111 mmol/L   CO2 26 22 - 32 mmol/L   Glucose, Bld 106 (H) 70 - 99 mg/dL   BUN 14 8 - 23 mg/dL   Creatinine, Ser 1.01 0.61 - 1.24 mg/dL   Calcium 9.7 8.9 - 10.3 mg/dL   Total Protein 7.2 6.5 - 8.1 g/dL   Albumin 4.2 3.5 - 5.0 g/dL   AST 40 15 - 41 U/L   ALT 49 (H) 0 - 44 U/L   Alkaline Phosphatase 73 38 - 126 U/L   Total Bilirubin 0.6 0.3 - 1.2 mg/dL   GFR calc non Af Amer >60 >60 mL/min   GFR calc Af Amer >60 >60 mL/min   Anion gap 7 5 - 15  Troponin I (High Sensitivity)     Status: None   Collection Time: 06/23/19  4:10 AM  Result Value Ref Range   Troponin I (High Sensitivity) 13 <18 ng/L  Troponin I (High Sensitivity)     Status: None   Collection Time: 06/23/19  7:58 AM  Result Value Ref Range   Troponin I (High Sensitivity) 12 <18 ng/L  Fibrin derivatives D-Dimer ([facility] only)     Status: Abnormal   Collection Time: 06/23/19  7:58 AM  Result Value Ref Range   Fibrin derivatives D-dimer ([facility]) 532.21 (H) 0.00 - 499.00 ng/mL (FEU)   ____________________________________________  EKG My review and personal interpretation at Time: 4:03   Indication: sob  Rate:  105  Rhythm: sinus  Axis: normal Other: normal intervals, no stemi, nonspecific st abn, pr depressions in II III aVF ____________________________________________  RADIOLOGY  I personally reviewed all radiographic images ordered to evaluate for the above acute complaints and reviewed radiology reports and findings.  These findings were personally discussed with the patient.  Please see medical record for radiology report.  ____________________________________________   PROCEDURES  Procedure(s) performed:  Procedures    Critical Care performed: no ____________________________________________   INITIAL IMPRESSION / ASSESSMENT AND PLAN / ED COURSE  Pertinent labs & imaging results that were available during my care of the patient were reviewed by me and considered in my medical decision making (see chart for details).   DDX: COPD, pneumonia, asthma, PE, ACS, dissection, enteritis, gastritis, esophagitis, hypertensive urgency  HARJIT LEIDER is a 69 y.o. who presents to the ED with symptoms as described above.  Patient is afebrile hypertensive mild tachypnea but not in acute respiratory distress.  I do suspect he is having component of acute COPD.  Will give nebs.  Will reorder his home meds.  His abdominal exam is soft benign.  Noted nonspecific findings of the upper abdomen on chest x-ray but he has a reassuring exam and not having any symptoms of obstructive pattern.  His EKG has nonspecific changes.  High snsitivity troponins are negative.   Does not seem consistent with dissection.  Will order D-dimer to further stratify for PE.  Clinical Course as of Jun 22 1257  Tue Jun 23, 2019  1029 Enzymes are stable.  CT imaging reassuring.  Patient's blood pressure improving after giving his hom meds.  No signs of CHF, PE or pna.   He is tolerating p.o.  Feels improved after nebs.  Will discharge with inhaler brief course steroid for emphysema/bronchitis.  I believe he is stable and appropriate for outpatient  follow-up.   [PR]    Clinical Course User Index [PR] Merlyn Lot, MD    The patient was evaluated in Emergency Department today for the symptoms described in the history of present illness. He/she was evaluated in the context of the global COVID-19 pandemic, which necessitated consideration that the patient might be at risk for infection with the SARS-CoV-2 virus that causes COVID-19. Institutional protocols and algorithms that pertain to the evaluation of patients at risk for COVID-19 are in a state of rapid change based on information released by regulatory bodies including the CDC and federal and state organizations. These policies and algorithms were followed during the patient's care in the ED.  As part of my medical decision making, I reviewed the following data within the [facility] notes reviewed and incorporated, Labs reviewed, notes from prior ED visits and [place] Controlled Substance Database   ____________________________________________   FINAL CLINICAL IMPRESSION(S) / ED DIAGNOSES  Final diagnoses:  SOB (shortness of breath)      NEW MEDICATIONS STARTED DURING THIS VISIT:  New Prescriptions   PREDNISONE (DELTASONE) 20 MG TABLET    Take 1 tablet (20 mg total) by mouth daily for 4 days.     Note:  This document was prepared using Dragon voice recognition software and may include unintentional dictation errors.    Merlyn Lot, MD 06/23/19 1259

## 2019-06-26 ENCOUNTER — Other Ambulatory Visit: Payer: Self-pay

## 2019-06-26 ENCOUNTER — Ambulatory Visit (LOCAL_COMMUNITY_HEALTH_CENTER): Payer: Self-pay

## 2019-06-26 DIAGNOSIS — R7611 Nonspecific reaction to tuberculin skin test without active tuberculosis: Secondary | ICD-10-CM

## 2019-06-26 NOTE — Progress Notes (Signed)
Patient presents for TBS today for Group Home.  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   Patient complains of shortness of breath today.  Per Epic, patient has been seen multiple times for shortness of breath. Consult via phone call with Dr. Lauretta Chester to review Epic notes.  CT 06/23/2019 negative for active TB and cleared for normal TB Screening today as requested by patient for group home. Dahlia Bailiff, RN

## 2019-06-29 ENCOUNTER — Other Ambulatory Visit: Payer: Medicare Other

## 2019-07-03 NOTE — Progress Notes (Signed)
Attestation of Medical Director for clinical support staff: I agree with the care provided to this patient and was available for any consultation.  I was consulted at the point of care and documentation reflects my recommendations.  Travoris Bushey [place] Krystine Pabst, MD, MPH, ABFM [facility] Medical Director  

## 2019-07-10 ENCOUNTER — Other Ambulatory Visit: Payer: Self-pay

## 2019-07-10 ENCOUNTER — Ambulatory Visit (LOCAL_COMMUNITY_HEALTH_CENTER): Payer: Medicare Other

## 2019-07-10 DIAGNOSIS — Z719 Counseling, unspecified: Secondary | ICD-10-CM

## 2019-07-10 NOTE — Progress Notes (Signed)
Pt with group home today. Pt care discussed with caregiver, counseled that patient's screening was already completed 06/26/19 and is good for a year; if signs of TB occur seek medical evaluation.  PPD skin tests are not indicated for those that have had a positive in the past; screening questions would be appropriate.

## 2019-08-03 ENCOUNTER — Emergency Department: Payer: Medicare Other

## 2019-08-03 ENCOUNTER — Other Ambulatory Visit: Payer: Self-pay

## 2019-08-03 ENCOUNTER — Encounter: Payer: Self-pay | Admitting: Radiology

## 2019-08-03 ENCOUNTER — Emergency Department
Admission: EM | Admit: 2019-08-03 | Discharge: 2019-08-03 | Disposition: A | Discharge status: Home / Self Care | Payer: Medicare Other | Attending: Emergency Medicine | Admitting: Emergency Medicine

## 2019-08-03 DIAGNOSIS — Z7982 Long term (current) use of aspirin: Secondary | ICD-10-CM | POA: Insufficient documentation

## 2019-08-03 DIAGNOSIS — J441 Chronic obstructive pulmonary disease with (acute) exacerbation: Secondary | ICD-10-CM | POA: Diagnosis not present

## 2019-08-03 DIAGNOSIS — Z87891 Personal history of nicotine dependence: Secondary | ICD-10-CM | POA: Diagnosis not present

## 2019-08-03 DIAGNOSIS — J80 Acute respiratory distress syndrome: Secondary | ICD-10-CM | POA: Diagnosis present

## 2019-08-03 DIAGNOSIS — Z7952 Long term (current) use of systemic steroids: Secondary | ICD-10-CM | POA: Insufficient documentation

## 2019-08-03 DIAGNOSIS — Z79899 Other long term (current) drug therapy: Secondary | ICD-10-CM | POA: Insufficient documentation

## 2019-08-03 DIAGNOSIS — I10 Essential (primary) hypertension: Secondary | ICD-10-CM

## 2019-08-03 DIAGNOSIS — Z7951 Long term (current) use of inhaled steroids: Secondary | ICD-10-CM | POA: Diagnosis not present

## 2019-08-03 DIAGNOSIS — Z20822 Contact with and (suspected) exposure to covid-19: Secondary | ICD-10-CM | POA: Diagnosis not present

## 2019-08-03 DIAGNOSIS — R0603 Acute respiratory distress: Secondary | ICD-10-CM

## 2019-08-03 LAB — URINALYSIS, COMPLETE (UACMP) WITH MICROSCOPIC
Bilirubin Urine: NEGATIVE
Glucose, UA: NEGATIVE mg/dL
Hgb urine dipstick: NEGATIVE
Ketones, ur: NEGATIVE mg/dL
Leukocytes,Ua: NEGATIVE
Nitrite: NEGATIVE
Protein, ur: NEGATIVE mg/dL
Specific Gravity, Urine: 1.01 (ref 1.005–1.030)
Squamous Epithelial / HPF: NONE SEEN (ref 0–5)
pH: 7 (ref 5.0–8.0)

## 2019-08-03 LAB — CBC WITH DIFFERENTIAL/PLATELET
Abs Immature Granulocytes: 0.02 10*3/uL (ref 0.00–0.07)
Basophils Absolute: 0 10*3/uL (ref 0.0–0.1)
Basophils Relative: 0 %
Eosinophils Absolute: 0.1 10*3/uL (ref 0.0–0.5)
Eosinophils Relative: 1 %
HCT: 46.5 % (ref 39.0–52.0)
Hemoglobin: 15.1 g/dL (ref 13.0–17.0)
Immature Granulocytes: 0 %
Lymphocytes Relative: 33 %
Lymphs Abs: 3 10*3/uL (ref 0.7–4.0)
MCH: 30.3 pg (ref 26.0–34.0)
MCHC: 32.5 g/dL (ref 30.0–36.0)
MCV: 93.2 fL (ref 80.0–100.0)
Monocytes Absolute: 0.7 10*3/uL (ref 0.1–1.0)
Monocytes Relative: 8 %
Neutro Abs: 5.3 10*3/uL (ref 1.7–7.7)
Neutrophils Relative %: 58 %
Platelets: 270 10*3/uL (ref 150–400)
RBC: 4.99 MIL/uL (ref 4.22–5.81)
RDW: 12.6 % (ref 11.5–15.5)
WBC: 9.2 10*3/uL (ref 4.0–10.5)
nRBC: 0 % (ref 0.0–0.2)

## 2019-08-03 LAB — COMPREHENSIVE METABOLIC PANEL
ALT: 28 U/L (ref 0–44)
AST: 28 U/L (ref 15–41)
Albumin: 4.4 g/dL (ref 3.5–5.0)
Alkaline Phosphatase: 87 U/L (ref 38–126)
Anion gap: 6 (ref 5–15)
BUN: 13 mg/dL (ref 8–23)
CO2: 29 mmol/L (ref 22–32)
Calcium: 9.7 mg/dL (ref 8.9–10.3)
Chloride: 104 mmol/L (ref 98–111)
Creatinine, Ser: 0.93 mg/dL (ref 0.61–1.24)
GFR calc Af Amer: >60 mL/min (ref >60)
GFR calc non Af Amer: >60 mL/min (ref >60)
Glucose, Bld: 104 mg/dL — ABNORMAL HIGH (ref 70–99)
Potassium: 3.6 mmol/L (ref 3.5–5.1)
Sodium: 139 mmol/L (ref 135–145)
Total Bilirubin: 0.5 mg/dL (ref 0.3–1.2)
Total Protein: 7.4 g/dL (ref 6.5–8.1)

## 2019-08-03 LAB — URINE DRUG SCREEN, QUALITATIVE (ARMC ONLY)
Amphetamines, Ur Screen: NOT DETECTED
Barbiturates, Ur Screen: NOT DETECTED
Benzodiazepine, Ur Scrn: NOT DETECTED
Cannabinoid 50 Ng, Ur ~~LOC~~: NOT DETECTED
Cocaine Metabolite,Ur ~~LOC~~: NOT DETECTED
MDMA (Ecstasy)Ur Screen: NOT DETECTED
Methadone Scn, Ur: NOT DETECTED
Opiate, Ur Screen: NOT DETECTED
Phencyclidine (PCP) Ur S: NOT DETECTED
Tricyclic, Ur Screen: NOT DETECTED

## 2019-08-03 LAB — TROPONIN I (HIGH SENSITIVITY)
Troponin I (High Sensitivity): 10 ng/L (ref <18)
Troponin I (High Sensitivity): 11 ng/L (ref <18)

## 2019-08-03 LAB — SARS CORONAVIRUS 2 BY RT PCR (HOSPITAL ORDER, PERFORMED IN ~~LOC~~ HOSPITAL LAB): SARS Coronavirus 2: NEGATIVE

## 2019-08-03 LAB — BRAIN NATRIURETIC PEPTIDE: B Natriuretic Peptide: 44.4 pg/mL (ref 0.0–100.0)

## 2019-08-03 MED ORDER — ISOSORBIDE MONONITRATE ER 60 MG PO TB24
30.0000 mg | ORAL_TABLET | Freq: Once | ORAL | Status: AC
  Administered 2019-08-03: 30 mg via ORAL
  Filled 2019-08-03: qty 1

## 2019-08-03 MED ORDER — IPRATROPIUM-ALBUTEROL 0.5-2.5 (3) MG/3ML IN SOLN
3.0000 mL | Freq: Once | RESPIRATORY_TRACT | Status: AC
  Filled 2019-08-03: qty 3

## 2019-08-03 MED ORDER — IPRATROPIUM-ALBUTEROL 0.5-2.5 (3) MG/3ML IN SOLN
RESPIRATORY_TRACT | Status: AC
  Administered 2019-08-03: 3 mL via RESPIRATORY_TRACT
  Filled 2019-08-03: qty 3

## 2019-08-03 MED ORDER — DILTIAZEM HCL ER COATED BEADS 120 MG PO CP24
120.0000 mg | ORAL_CAPSULE | Freq: Once | ORAL | Status: AC
  Administered 2019-08-03: 120 mg via ORAL
  Filled 2019-08-03: qty 1

## 2019-08-03 MED ORDER — HYDRALAZINE HCL 20 MG/ML IJ SOLN
10.0000 mg | Freq: Once | INTRAMUSCULAR | Status: AC
  Administered 2019-08-03: 10 mg via INTRAVENOUS
  Filled 2019-08-03: qty 1

## 2019-08-03 MED ORDER — METHYLPREDNISOLONE SODIUM SUCC 125 MG IJ SOLR
125.0000 mg | Freq: Once | INTRAMUSCULAR | Status: AC
  Administered 2019-08-03: 125 mg via INTRAVENOUS
  Filled 2019-08-03: qty 2

## 2019-08-03 MED ORDER — SODIUM CHLORIDE 0.9 % IV BOLUS
1000.0000 mL | Freq: Once | INTRAVENOUS | Status: AC
  Administered 2019-08-03: 1000 mL via INTRAVENOUS

## 2019-08-03 MED ORDER — IPRATROPIUM-ALBUTEROL 0.5-2.5 (3) MG/3ML IN SOLN
3.0000 mL | Freq: Once | RESPIRATORY_TRACT | Status: AC
  Administered 2019-08-03: 3 mL via RESPIRATORY_TRACT
  Filled 2019-08-03: qty 3

## 2019-08-03 MED ORDER — PREDNISONE 20 MG PO TABS: ORAL_TABLET | ORAL | 0 refills | Status: DC

## 2019-08-03 NOTE — ED Provider Notes (Signed)
[facility] Emergency Department Provider Note   ____________________________________________   First MD Initiated Contact with Patient 08/03/19 0250     (approximate)  I have reviewed the triage vital signs and the nursing notes.   HISTORY  Chief Complaint Respiratory distress  Level V caveat: History limited by patient's schizophrenia; poor historian  HPI Carlos Nelson is a 69 y.o. male brought to the ED via EMS from care home with a chief complaint of respiratory distress.  Patient has a history of COPD, hypertension, schizophrenia who has been refusing all of his medications over the past several days.  Complains of chest tightness and breathing difficulty since last evening.  Rest of history is limited by patient's distress.      Past Medical History:  Diagnosis Date  . Chronic respiratory failure with hypoxia ([place])   . COPD (chronic obstructive pulmonary disease) ([place])   . Hypertension   . Schizophrenia [facility])     Patient Active Problem List   Diagnosis Date Noted  . Scrotum pain   . Elevated troponin   . Demand ischemia ([place]) 06/11/2019  . Malnutrition of moderate degree 06/10/2019  . NSTEMI (non-ST elevated myocardial infarction) ([place]) 06/09/2019  . Depression with anxiety 06/09/2019  . Pleuritic chest pain 05/20/2019  . CAP (community acquired pneumonia) 05/20/2019  . Hypertension   . COPD exacerbation ([place])   . Acute respiratory failure with hypoxia ([place])   . Altered mental status 09/25/2018  . Schizophrenia ([place])   . Tachycardia 08/21/2018  . Schizophrenia, chronic with acute exacerbation ([place]) 08/17/2018  . Elevated PSA 05/24/2015    Past Surgical History:  Procedure Laterality Date  . COLONOSCOPY    . COLONOSCOPY WITH PROPOFOL N/A 12/13/2016   Procedure: COLONOSCOPY WITH PROPOFOL;  Surgeon: Lollie Sails, MD;  Location: [facility] ENDOSCOPY;  Service: Endoscopy;  Laterality: N/A;  . ESOPHAGOGASTRODUODENOSCOPY (EGD) WITH  PROPOFOL N/A 12/13/2016   Procedure: ESOPHAGOGASTRODUODENOSCOPY (EGD) WITH PROPOFOL;  Surgeon: Lollie Sails, MD;  Location: [facility] ENDOSCOPY;  Service: Endoscopy;  Laterality: N/A;  . left arm surgery      Prior to Admission medications   Medication Sig Start Date End Date Taking? Authorizing Provider  albuterol (PROVENTIL) (2.5 MG/3ML) 0.083% nebulizer solution Take 3 mLs (2.5 mg total) by nebulization every 6 (six) hours as needed for wheezing or shortness of breath. 06/23/19   Merlyn Lot, MD  albuterol (VENTOLIN HFA) 108 (90 Base) MCG/ACT inhaler Inhale 1-2 puffs into the lungs every 6 (six) hours as needed for wheezing or shortness of breath.    [provider]  ARIPiprazole ER (ABILIFY MAINTENA) 400 MG PRSY prefilled syringe Inject 400 mg into the muscle every 30 (thirty) days.    [provider]  aspirin EC 81 MG EC tablet Take 1 tablet (81 mg total) by mouth daily. 06/13/19   Loletha Grayer, MD  atorvastatin (LIPITOR) 40 MG tablet Take 1 tablet (40 mg total) by mouth daily. 06/13/19   Loletha Grayer, MD  clopidogrel (PLAVIX) 75 MG tablet Take 1 tablet (75 mg total) by mouth daily. 06/13/19   Loletha Grayer, MD  diltiazem (CARDIZEM CD) 120 MG 24 hr capsule Take 1 capsule (120 mg total) by mouth daily. 06/12/19   Loletha Grayer, MD  divalproex (DEPAKOTE ER) 250 MG 24 hr tablet Take 500 mg by mouth at bedtime.    [provider]  donepezil (ARICEPT) 5 MG tablet Take 5 mg by mouth at bedtime.    [provider]  feeding  supplement, ENSURE ENLIVE, (ENSURE ENLIVE) LIQD Take 237 mLs by mouth 2 (two) times daily between meals. 06/13/19   Loletha Grayer, MD  haloperidol (HALDOL) 5 MG tablet Take 2.5 mg by mouth daily.    [provider]  hydrOXYzine (ATARAX/VISTARIL) 50 MG tablet Take 50 mg by mouth every 6 (six) hours as needed for anxiety or itching.    [provider]  LORazepam (ATIVAN) 1 MG tablet Take 1 tablet (1 mg total) by  mouth every 6 (six) hours as needed for anxiety. 08/22/18   Gouru, Illene Silver, MD  mometasone-formoterol (DULERA) 100-5 MCG/ACT AERO Inhale 2 puffs into the lungs in the morning and at bedtime. 06/12/19   Loletha Grayer, MD  predniSONE (DELTASONE) 20 MG tablet 3 tablets daily x 4 days 08/03/19   Paulette Blanch, MD  QUEtiapine (SEROQUEL) 400 MG tablet Take 800 mg by mouth at bedtime.    [provider]  traZODone (DESYREL) 150 MG tablet Take 300 mg by mouth at bedtime.     [provider]  trihexyphenidyl (ARTANE) 5 MG tablet Take 1 tablet (5 mg total) by mouth 3 (three) times daily with meals. 05/22/19   Lorella Nimrod, MD  vitamin B-12 (CYANOCOBALAMIN) 1000 MCG tablet Take 1,000 mcg by mouth daily.    [provider]    Allergies Ace inhibitors  Family History  Problem Relation Age of Onset  . Prostate cancer Neg Hx   . Bladder Cancer Neg Hx     Social History Social History   Tobacco Use  . Smoking status: Former Smoker    Packs/day: 1.00    Types: Cigarettes  . Smokeless tobacco: Never Used  Substance Use Topics  . Alcohol use: Yes    Alcohol/week: 0.0 standard drinks    Comment: beer  . Drug use: Yes    Types: Marijuana    Comment: marjuana    Review of Systems  Constitutional: No fever/chills Eyes: No visual changes. ENT: No sore throat. Cardiovascular: Positive for chest pain. Respiratory: Positive for shortness of breath. Gastrointestinal: No abdominal pain.  No nausea, no vomiting.  No diarrhea.  No constipation. Genitourinary: Negative for dysuria. Musculoskeletal: Negative for back pain. Skin: Negative for rash. Neurological: Negative for headaches, focal weakness or numbness.   ____________________________________________   PHYSICAL EXAM:  VITAL SIGNS: ED Triage Vitals  Enc Vitals Group     BP      Pulse      Resp      Temp      Temp src      SpO2      Weight      Height      Head Circumference      Peak Flow      Pain Score       Pain Loc      Pain Edu?      Excl. in [place]?     Constitutional: Alert and oriented.  Cachectic appearing and in moderate acute distress. Eyes: Conjunctivae are normal. PERRL. EOMI. Head: Atraumatic. Nose: No congestion/rhinnorhea. Mouth/Throat: Mucous membranes are moist.   Neck: No stridor.   Cardiovascular: Normal rate, regular rhythm. Grossly normal heart sounds.  Good peripheral circulation. Respiratory: Increased respiratory effort.  Retractions.  Tripoding.  Lungs diminished with wheezing bilaterally. Gastrointestinal: Soft and nontender. No distention. No abdominal bruits. No CVA tenderness. Musculoskeletal: No lower extremity tenderness nor edema.  No joint effusions. Neurologic: Alert and oriented x3.  Mumbling speech and language. No gross focal neurologic deficits  are appreciated.  Skin:  Skin is warm, dry and intact. No rash noted. Psychiatric: Mood and affect are mildly paranoid. Speech and behavior are normal.  ____________________________________________   LABS (all labs ordered are listed, but only abnormal results are displayed)  Labs Reviewed  URINALYSIS, COMPLETE (UACMP) WITH MICROSCOPIC - Abnormal; Notable for the following components:      Result Value   Color, Urine STRAW (*)    APPearance CLEAR (*)    Bacteria, UA RARE (*)    All other components within normal limits  COMPREHENSIVE METABOLIC PANEL - Abnormal; Notable for the following components:   Glucose, Bld 104 (*)    All other components within normal limits  SARS CORONAVIRUS 2 BY RT PCR (HOSPITAL ORDER, [place] [facility])  CBC WITH DIFFERENTIAL/PLATELET  BRAIN NATRIURETIC PEPTIDE  URINE DRUG SCREEN, QUALITATIVE ([facility] ONLY)  TROPONIN I (HIGH SENSITIVITY)  TROPONIN I (HIGH SENSITIVITY)   ____________________________________________  EKG  ED ECG REPORT I, Tahja Liao J, the attending physician, personally viewed and interpreted this ECG.   Date: 08/03/2019  EKG Time:  0256  Rate: 94  Rhythm: normal EKG, normal sinus rhythm  Axis: Normal  Intervals:left posterior fascicular block  ST&T Change: Nonspecific  ____________________________________________  RADIOLOGY  ED MD interpretation: No acute cardiopulmonary process  Official radiology report(s): DG Chest Port 1 View  Result Date: 08/03/2019 CLINICAL DATA:  Shortness of breath. EXAM: PORTABLE CHEST 1 VIEW COMPARISON:  June 23, 2019 FINDINGS: Emphysematous changes are noted bilaterally. The lungs are hyperexpanded. The heart size is stable. Aortic calcifications are noted. There is no pneumothorax. There is no focal infiltrate. IMPRESSION: No active disease. Electronically Signed   By: Constance Holster M.D.   On: 08/03/2019 03:55    ____________________________________________   PROCEDURES  Procedure(s) performed (including Critical Care):  .1-3 Lead EKG Interpretation Performed by: Paulette Blanch, MD Authorized by: Paulette Blanch, MD     Interpretation: normal     ECG rate:  99   ECG rate assessment: normal     Rhythm: sinus rhythm     Ectopy: none     Conduction: normal   Comments:     Patient placed on cardiac monitor to evaluate for arrhythmias      ____________________________________________   INITIAL IMPRESSION / ASSESSMENT AND PLAN / ED COURSE  As part of my medical decision making, I reviewed the following data within the [facility] notes reviewed and incorporated, Labs reviewed, EKG interpreted, Old chart reviewed, Radiograph reviewed, Discussed with admitting physician and Notes from prior ED visits     Carlos Nelson was evaluated in Emergency Department on 08/03/2019 for the symptoms described in the history of present illness. He was evaluated in the context of the global COVID-19 pandemic, which necessitated consideration that the patient might be at risk for infection with the SARS-CoV-2 virus that causes COVID-19. Institutional protocols and  algorithms that pertain to the evaluation of patients at risk for COVID-19 are in a state of rapid change based on information released by regulatory bodies including the CDC and federal and state organizations. These policies and algorithms were followed during the patient's care in the ED.    69 year old male with COPD, hypertension, schizophrenia off all medications presenting with respiratory distress. Differential includes, but is not limited to, viral syndrome, bronchitis including COPD exacerbation, pneumonia, reactive airway disease including asthma, CHF including exacerbation with or without pulmonary/interstitial edema, pneumothorax, ACS, thoracic trauma, and pulmonary embolism.  Will  obtain lab work, chest x-ray, COVID-19 swab.  Initiate IV Solu-Medrol, DuoNeb, hydralazine for elevated blood pressure.  Consider BiPAP if no improvement on DuoNeb.   Clinical Course as of Aug 03 702  Mon Aug 03, 2019  0344 Improved after DuoNeb.  Awaiting lab results.  Will continue to monitor.   [JS]  0448 Oral temp 98.1 F.  Patient feeling better.  Will order his home antihypertensives from a list of current medications dated 06/12/2019.  Will repeat troponin.   [JS]  [id number] Updated patient on all test results, including 2 sets of negative troponins. Blood pressure is much improved at 148/87. Will give acute additional DuoNeb for diminished aeration.   [JS]  0616 Aeration improved after second DuoNeb.  HR 99.  Blood pressure creeping up but patient had his home doses of Cardizem and Imdur.  Will discharge back to facility with Prednisone burst.  Strict return precautions given.  Patient verbalizes understanding agrees with plan of care.   [JS]  0092 Hold discharge for patient tachycardic with rates in the 110s. This is likely secondary to patient shaking all limbs. He is alert, awake, eating graham crackers and peanut butter. Will administer IV fluids and reassess heart rate. BP 144/91.   [JS]  0703  Heart rate 99-111 dependent on patient shaking/moving.  Will discharge after completion of IV fluids.   [JS]    Clinical Course User Index [JS] Paulette Blanch, MD     ____________________________________________   FINAL CLINICAL IMPRESSION(S) / ED DIAGNOSES  Final diagnoses:  Respiratory distress  Chronic obstructive pulmonary disease with acute exacerbation [facility])  Essential hypertension     ED Discharge Orders         Ordered    predniSONE (DELTASONE) 20 MG tablet     Discontinue  Reprint     08/03/19 0618           Note:  This document was prepared using Dragon voice recognition software and may include unintentional dictation errors.   Paulette Blanch, MD 08/03/19 [phone]

## 2019-08-03 NOTE — ED Notes (Signed)
Disposition/Depature charted on incorrect patient in error by this RN

## 2019-08-03 NOTE — ED Notes (Signed)
This RN spoke with staff at [facility] and they stated they would have someone on the way to pick up patient shortly.

## 2019-08-03 NOTE — Discharge Instructions (Addendum)
1.  Finish Prednisone 60mg  daily x4 days.  Start your next dose Tuesday morning. 2.  Mr. Diltz took his Cardizem and Imdur this morning.  He will need his other medications when he gets back to the facility. 3.  Return to the ER for worsening symptoms, persistent vomiting, difficulty breathing or other concerns.

## 2019-08-03 NOTE — ED Notes (Signed)
Patient given graham crackers, peanut butter and soda.

## 2019-08-03 NOTE — ED Triage Notes (Signed)
Patient to ED via EMS from group home for chest pain and shortness of breath.  EMS also reports that group home stated patient has been refusing his medications.

## 2019-08-29 ENCOUNTER — Other Ambulatory Visit: Payer: Self-pay

## 2019-08-29 ENCOUNTER — Emergency Department
Admission: EM | Admit: 2019-08-29 | Discharge: 2019-08-29 | Disposition: A | Discharge status: Home / Self Care | Payer: Medicare Other | Attending: Emergency Medicine | Admitting: Emergency Medicine

## 2019-08-29 ENCOUNTER — Encounter: Payer: Self-pay | Admitting: Emergency Medicine

## 2019-08-29 ENCOUNTER — Emergency Department: Payer: Medicare Other

## 2019-08-29 DIAGNOSIS — F17210 Nicotine dependence, cigarettes, uncomplicated: Secondary | ICD-10-CM | POA: Insufficient documentation

## 2019-08-29 DIAGNOSIS — I10 Essential (primary) hypertension: Secondary | ICD-10-CM | POA: Insufficient documentation

## 2019-08-29 DIAGNOSIS — Z7982 Long term (current) use of aspirin: Secondary | ICD-10-CM | POA: Insufficient documentation

## 2019-08-29 DIAGNOSIS — Z7951 Long term (current) use of inhaled steroids: Secondary | ICD-10-CM | POA: Insufficient documentation

## 2019-08-29 DIAGNOSIS — R0789 Other chest pain: Secondary | ICD-10-CM | POA: Insufficient documentation

## 2019-08-29 DIAGNOSIS — J449 Chronic obstructive pulmonary disease, unspecified: Secondary | ICD-10-CM | POA: Insufficient documentation

## 2019-08-29 DIAGNOSIS — R079 Chest pain, unspecified: Secondary | ICD-10-CM | POA: Diagnosis present

## 2019-08-29 LAB — CBC
HCT: 38.4 % — ABNORMAL LOW (ref 39.0–52.0)
Hemoglobin: 13.3 g/dL (ref 13.0–17.0)
MCH: 30.2 pg (ref 26.0–34.0)
MCHC: 34.6 g/dL (ref 30.0–36.0)
MCV: 87.1 fL (ref 80.0–100.0)
Platelets: 223 10*3/uL (ref 150–400)
RBC: 4.41 MIL/uL (ref 4.22–5.81)
RDW: 12.3 % (ref 11.5–15.5)
WBC: 6.8 10*3/uL (ref 4.0–10.5)
nRBC: 0 % (ref 0.0–0.2)

## 2019-08-29 LAB — BASIC METABOLIC PANEL
Anion gap: 8 (ref 5–15)
BUN: 11 mg/dL (ref 8–23)
CO2: 29 mmol/L (ref 22–32)
Calcium: 9.3 mg/dL (ref 8.9–10.3)
Chloride: 103 mmol/L (ref 98–111)
Creatinine, Ser: 0.93 mg/dL (ref 0.61–1.24)
GFR calc Af Amer: >60 mL/min (ref >60)
GFR calc non Af Amer: >60 mL/min (ref >60)
Glucose, Bld: 95 mg/dL (ref 70–99)
Potassium: 3.6 mmol/L (ref 3.5–5.1)
Sodium: 140 mmol/L (ref 135–145)

## 2019-08-29 LAB — TROPONIN I (HIGH SENSITIVITY)
Troponin I (High Sensitivity): 6 ng/L (ref <18)
Troponin I (High Sensitivity): 7 ng/L (ref <18)
Troponin I (High Sensitivity): 5 ng/L (ref <18)

## 2019-08-29 NOTE — ED Triage Notes (Addendum)
Patient with complaint of central chest pain and shortness of breath times 2- 3 months. Patient from [place]. Patient was given 324 mg asa by ems.

## 2019-08-29 NOTE — ED Notes (Signed)
Repeat VS obtained by this RN. Pt visualized in NAD at this time. This RN apologized and explained delay. Pt visualized intermittently ambulating around the lobby with no difficulty.

## 2019-08-29 NOTE — Discharge Instructions (Addendum)
Please continue your normal care at home and continue your medications.  Your testing here showed no signs of strain or damage to your heart.  Return to the ED with any further chest pains or acute concerns.

## 2019-08-29 NOTE — ED Provider Notes (Signed)
[facility] Emergency Department Provider Note ____________________________________________   First MD Initiated Contact with Patient 08/29/19 [phone]     (approximate)  I have reviewed the triage vital signs and the nursing notes.  HISTORY  Chief Complaint Chest Pain   HPI Carlos Nelson is a 69 y.o. malewho presents to the ED for evaluation of chest pain.   Chart review indicates history of COPD, HTN and schizophrenia.  Patient resides in local group home.  Patient reports experiencing chest pain and the sensation that his "lungs were all broken up" this morning at about 2 AM.  Patient reports he has felt this pain "for a long time", and seems to indicate a matter of months that he has felt similar chest pains.  Patient reports he does not feel the pain now, and that it lasts "a good little bit" when it does come on.   Patient denies recent febrile illnesses, shortness of breath, cough, abdominal pain, vomiting, diarrhea.  Reports tolerating p.o. intake and toileting at his baseline.  Reports feeling well here in the ED and "just wanted to get checked out." Patient sat in the waiting room for approximately 9 hours prior to my initial evaluation.  Past Medical History:  Diagnosis Date  . Chronic respiratory failure with hypoxia ([place])   . COPD (chronic obstructive pulmonary disease) ([place])   . Hypertension   . Schizophrenia [facility])     Patient Active Problem List   Diagnosis Date Noted  . Scrotum pain   . Elevated troponin   . Demand ischemia ([place]) 06/11/2019  . Malnutrition of moderate degree 06/10/2019  . NSTEMI (non-ST elevated myocardial infarction) ([place]) 06/09/2019  . Depression with anxiety 06/09/2019  . Pleuritic chest pain 05/20/2019  . CAP (community acquired pneumonia) 05/20/2019  . Hypertension   . COPD exacerbation ([place])   . Acute respiratory failure with hypoxia ([place])   . Altered mental status 09/25/2018  . Schizophrenia ([place])   .  Tachycardia 08/21/2018  . Schizophrenia, chronic with acute exacerbation ([place]) 08/17/2018  . Elevated PSA 05/24/2015    Past Surgical History:  Procedure Laterality Date  . COLONOSCOPY    . COLONOSCOPY WITH PROPOFOL N/A 12/13/2016   Procedure: COLONOSCOPY WITH PROPOFOL;  Surgeon: Lollie Sails, MD;  [facility] ENDOSCOPY;  Service: Endoscopy;  Laterality: N/A;  . ESOPHAGOGASTRODUODENOSCOPY (EGD) WITH PROPOFOL N/A 12/13/2016   Procedure: ESOPHAGOGASTRODUODENOSCOPY (EGD) WITH PROPOFOL;  Surgeon: Lollie Sails, MD;  Location: [facility] ENDOSCOPY;  Service: Endoscopy;  Laterality: N/A;  . left arm surgery      Prior to Admission medications   Medication Sig Start Date End Date Taking? Authorizing Provider  albuterol (PROVENTIL) (2.5 MG/3ML) 0.083% nebulizer solution Take 3 mLs (2.5 mg total) by nebulization every 6 (six) hours as needed for wheezing or shortness of breath. 06/23/19   Merlyn Lot, MD  albuterol (VENTOLIN HFA) 108 (90 Base) MCG/ACT inhaler Inhale 1-2 puffs into the lungs every 6 (six) hours as needed for wheezing or shortness of breath.    [provider]  ARIPiprazole ER (ABILIFY MAINTENA) 400 MG PRSY prefilled syringe Inject 400 mg into the muscle every 30 (thirty) days.    [provider]  aspirin EC 81 MG EC tablet Take 1 tablet (81 mg total) by mouth daily. 06/13/19   Loletha Grayer, MD  atorvastatin (LIPITOR) 40 MG tablet Take 1 tablet (40 mg total) by mouth daily. 06/13/19   Loletha Grayer, MD  clopidogrel (PLAVIX) 75 MG tablet Take 1 tablet (  75 mg total) by mouth daily. 06/13/19   Loletha Grayer, MD  diltiazem (CARDIZEM CD) 120 MG 24 hr capsule Take 1 capsule (120 mg total) by mouth daily. 06/12/19   Loletha Grayer, MD  divalproex (DEPAKOTE ER) 250 MG 24 hr tablet Take 500 mg by mouth at bedtime.    [provider]  donepezil (ARICEPT) 5 MG tablet Take 5 mg by mouth at bedtime.    [provider]  feeding supplement,  ENSURE ENLIVE, (ENSURE ENLIVE) LIQD Take 237 mLs by mouth 2 (two) times daily between meals. 06/13/19   Loletha Grayer, MD  haloperidol (HALDOL) 5 MG tablet Take 2.5 mg by mouth daily.    [provider]  hydrOXYzine (ATARAX/VISTARIL) 50 MG tablet Take 50 mg by mouth every 6 (six) hours as needed for anxiety or itching.    [provider]  LORazepam (ATIVAN) 1 MG tablet Take 1 tablet (1 mg total) by mouth every 6 (six) hours as needed for anxiety. 08/22/18   Gouru, Illene Silver, MD  mometasone-formoterol (DULERA) 100-5 MCG/ACT AERO Inhale 2 puffs into the lungs in the morning and at bedtime. 06/12/19   Loletha Grayer, MD  predniSONE (DELTASONE) 20 MG tablet 3 tablets daily x 4 days 08/03/19   Paulette Blanch, MD  QUEtiapine (SEROQUEL) 400 MG tablet Take 800 mg by mouth at bedtime.    [provider]  traZODone (DESYREL) 150 MG tablet Take 300 mg by mouth at bedtime.     [provider]  trihexyphenidyl (ARTANE) 5 MG tablet Take 1 tablet (5 mg total) by mouth 3 (three) times daily with meals. 05/22/19   Lorella Nimrod, MD  vitamin B-12 (CYANOCOBALAMIN) 1000 MCG tablet Take 1,000 mcg by mouth daily.    [provider]    Allergies Ace inhibitors  Family History  Problem Relation Age of Onset  . Prostate cancer Neg Hx   . Bladder Cancer Neg Hx     Social History Social History   Tobacco Use  . Smoking status: Current Every Day Smoker    Packs/day: 1.00    Types: Cigarettes  . Smokeless tobacco: Never Used  Substance Use Topics  . Alcohol use: Yes    Alcohol/week: 0.0 standard drinks    Comment: beer  . Drug use: Not Currently    Types: Marijuana    Review of Systems  Constitutional: No fever/chills Eyes: No visual changes. ENT: No sore throat. Cardiovascular: Positive for chest pain Respiratory: Denies shortness of breath. Gastrointestinal: No abdominal pain.  No nausea, no vomiting.  No diarrhea.  No constipation. Genitourinary: Negative for  dysuria. Musculoskeletal: Negative for back pain. Skin: Negative for rash. Neurological: Negative for headaches, focal weakness or numbness.   ____________________________________________   PHYSICAL EXAM:  VITAL SIGNS: Vitals:   08/29/19 1210 08/29/19 1230  BP:  (!) 163/93  Pulse: 82 69  Resp: (!) 21 (!) 21  Temp:    SpO2: 93% 96%      Constitutional: Alert and oriented. Well appearing and in no acute distress.  Sitting up in bed, pleasant and conversational full sentences. Eyes: Conjunctivae are normal. PERRL. EOMI. Head: Atraumatic. Nose: No congestion/rhinnorhea. Mouth/Throat: Mucous membranes are moist.  Oropharynx non-erythematous. Neck: No stridor. No cervical spine tenderness to palpation. Cardiovascular: Normal rate, regular rhythm. Grossly normal heart sounds.  Good peripheral circulation. Respiratory: Normal respiratory effort.  No retractions. Lungs CTAB. Gastrointestinal: Soft , nondistended, nontender to palpation. No abdominal bruits. No CVA tenderness. Musculoskeletal: No lower extremity tenderness nor edema.  No joint effusions. No signs of acute trauma. Neurologic:  Normal speech and language. No gross focal neurologic deficits are appreciated. No gait instability noted. Skin:  Skin is warm, dry and intact. No rash noted to the affected area of pain. Psychiatric: Mood and affect are normal. Speech and behavior are normal.  ____________________________________________   LABS (all labs ordered are listed, but only abnormal results are displayed)  Labs Reviewed  CBC - Abnormal; Notable for the following components:      Result Value   HCT 38.4 (*)    All other components within normal limits  BASIC METABOLIC PANEL  TROPONIN I (HIGH SENSITIVITY)  TROPONIN I (HIGH SENSITIVITY)  TROPONIN I (HIGH SENSITIVITY)   ____________________________________________  12 Lead EKG  Twelve-lead EKG at 0104, sinus rhythm rate of 72 bpm, normal axis and intervals.  1  PVC.  No evidence of acute ischemia.  Twelve-lead EKG at 1136, sinus rhythm rate of 65 bpm, normal axis and intervals.  No evidence of acute ischemia. ____________________________________________  RADIOLOGY  ED MD interpretation: 2 view CXR without evidence of acute cardiopulmonary disease, hyperinflation is noted.  Official radiology report(s): DG Chest 2 View  Result Date: 08/29/2019 CLINICAL DATA:  Central chest pain EXAM: CHEST - 2 VIEW COMPARISON:  08/03/2019, CT 06/23/2019 FINDINGS: Hyperinflation with emphysematous disease. No focal opacity or pleural effusion. Normal heart size. No pneumothorax IMPRESSION: No active cardiopulmonary disease. Hyperinflation with emphysematous disease. Electronically Signed   By: Donavan Foil M.D.   On: 08/29/2019 01:56   ____________________________________________   PROCEDURES and INTERVENTIONS  Procedure(s) performed (including Critical Care):  Procedures  Medications - No data to display  ____________________________________________   INITIAL IMPRESSION / ASSESSMENT AND PLAN / ED COURSE  69 year old male with history of schizophrenia, and without history of CAD, presented to the ED with chronic chest pain not evidence of acute cardiac ischemia or ACS, amenable to outpatient management.  Mild and persistent hypertension, likely at his baseline, vitals otherwise normal on room air.  Reassuring examination without evidence of acute pathology, distress or neurovascular compromise.  Reassuring blood work without evidence of acute derangements.  Electrolytes WNL.  Nonischemic EKG and negative troponin x2.  Patient is chest pain-free here in the ED and I see no evidence of acute pathology to warrant further work-up in the ED or hospitalization.  We discussed outpatient management and return precautions for the ED were discussed.  Patient medically stable for discharge home.  Clinical Course as of Aug 28 1233  Sat Aug 29, 2019  1235 Educated  patient on reassuring work-up without evidence of acute cardiac ischemia or pathology, answered questions.   [DS]    Clinical Course User Index [DS] Vladimir Crofts, MD     ____________________________________________   FINAL CLINICAL IMPRESSION(S) / ED DIAGNOSES  Final diagnoses:  Other chest pain  Essential hypertension     ED Discharge Orders    None       Rigel Filsinger Tamala Julian   Note:  This document was prepared using Dragon voice recognition software and may include unintentional dictation errors.   Vladimir Crofts, MD 08/29/19 1236

## 2019-08-31 ENCOUNTER — Other Ambulatory Visit: Payer: Self-pay

## 2019-08-31 DIAGNOSIS — N400 Enlarged prostate without lower urinary tract symptoms: Secondary | ICD-10-CM

## 2019-09-01 ENCOUNTER — Other Ambulatory Visit: Payer: Medicare Other

## 2019-09-01 ENCOUNTER — Other Ambulatory Visit: Payer: Self-pay

## 2019-09-01 DIAGNOSIS — N400 Enlarged prostate without lower urinary tract symptoms: Secondary | ICD-10-CM

## 2019-09-02 LAB — PSA: Prostate Specific Ag, Serum: 4.4 ng/mL — ABNORMAL HIGH (ref 0.0–4.0)

## 2019-09-04 ENCOUNTER — Ambulatory Visit (INDEPENDENT_AMBULATORY_CARE_PROVIDER_SITE_OTHER): Payer: Medicare Other | Admitting: Urology

## 2019-09-04 ENCOUNTER — Other Ambulatory Visit: Payer: Self-pay

## 2019-09-04 ENCOUNTER — Encounter: Payer: Self-pay | Admitting: Urology

## 2019-09-04 VITALS — BP 165/97 | HR 86 | Ht 62.0 in | Wt 121.4 lb

## 2019-09-04 DIAGNOSIS — R972 Elevated prostate specific antigen [PSA]: Secondary | ICD-10-CM | POA: Diagnosis not present

## 2019-09-04 DIAGNOSIS — R102 Pelvic and perineal pain: Secondary | ICD-10-CM | POA: Diagnosis not present

## 2019-09-04 DIAGNOSIS — N521 Erectile dysfunction due to diseases classified elsewhere: Secondary | ICD-10-CM

## 2019-09-04 MED ORDER — SILDENAFIL CITRATE 20 MG PO TABS
20.0000 mg | ORAL_TABLET | Freq: Every day | ORAL | 11 refills | Status: DC
Start: 2019-09-04 — End: 2020-01-18

## 2019-09-04 NOTE — Patient Instructions (Signed)
Sildenafil tablets (Erectile Dysfunction) What is this medicine? SILDENAFIL (sil DEN a fil) is used to treat erection problems in men. This medicine may be used for other purposes; ask your health care provider or pharmacist if you have questions. COMMON BRAND NAME(S): Viagra What should I tell my health care provider before I take this medicine? They need to know if you have any of these conditions:  bleeding disorders  eye or vision problems, including a rare inherited eye disease called retinitis pigmentosa  anatomical deformation of the penis, Peyronie's disease, or history of priapism (painful and prolonged erection)  heart disease, angina, a history of heart attack, irregular heart beats, or other heart problems  high or low blood pressure  history of blood diseases, like sickle cell anemia or leukemia  history of stomach bleeding  kidney disease  liver disease  stroke  an unusual or allergic reaction to sildenafil, other medicines, foods, dyes, or preservatives  pregnant or trying to get pregnant  breast-feeding How should I use this medicine? Take this medicine by mouth with a glass of water. Follow the directions on the prescription label. The dose is usually taken 1 hour before sexual activity. You should not take the dose more than once per day. Do not take your medicine more often than directed. Talk to your pediatrician regarding the use of this medicine in children. This medicine is not used in children for this condition. Overdosage: If you think you have taken too much of this medicine contact a poison control center or emergency room at once. NOTE: This medicine is only for you. Do not share this medicine with others. What if I miss a dose? This does not apply. Do not take double or extra doses. What may interact with this medicine? Do not take this medicine with any of the following medications:  cisapride  nitrates like amyl nitrite, isosorbide  dinitrate, isosorbide mononitrate, nitroglycerin  riociguat This medicine may also interact with the following medications:  antiviral medicines for HIV or AIDS  bosentan  certain medicines for benign prostatic hyperplasia (BPH)  certain medicines for blood pressure  certain medicines for fungal infections like ketoconazole and itraconazole  cimetidine  erythromycin  rifampin This list may not describe all possible interactions. Give your health care provider a list of all the medicines, herbs, non-prescription drugs, or dietary supplements you use. Also tell them if you smoke, drink alcohol, or use illegal drugs. Some items may interact with your medicine. What should I watch for while using this medicine? If you notice any changes in your vision while taking this drug, call your doctor or health care professional as soon as possible. Stop using this medicine and call your health care provider right away if you have a loss of sight in one or both eyes. Contact your doctor or health care professional right away if you have an erection that lasts longer than 4 hours or if it becomes painful. This may be a sign of a serious problem and must be treated right away to prevent permanent damage. If you experience symptoms of nausea, dizziness, chest pain or arm pain upon initiation of sexual activity after taking this medicine, you should refrain from further activity and call your doctor or health care professional as soon as possible. Do not drink alcohol to excess (examples, 5 glasses of wine or 5 shots of whiskey) when taking this medicine. When taken in excess, alcohol can increase your chances of getting a headache or getting dizzy, increasing  your heart rate or lowering your blood pressure. Using this medicine does not protect you or your partner against HIV infection (the virus that causes AIDS) or other sexually transmitted diseases. What side effects may I notice from receiving this  medicine? Side effects that you should report to your doctor or health care professional as soon as possible:  allergic reactions like skin rash, itching or hives, swelling of the face, lips, or tongue  breathing problems  changes in hearing  changes in vision  chest pain  fast, irregular heartbeat  prolonged or painful erection  seizures Side effects that usually do not require medical attention (report to your doctor or health care professional if they continue or are bothersome):  back pain  dizziness  flushing  headache  indigestion  muscle aches  nausea  stuffy or runny nose This list may not describe all possible side effects. Call your doctor for medical advice about side effects. You may report side effects to FDA at 1-800-FDA-1088. Where should I keep my medicine? Keep out of reach of children. Store at room temperature between 15 and 30 degrees C (59 and 86 degrees F). Throw away any unused medicine after the expiration date. NOTE: This sheet is a summary. It may not cover all possible information. If you have questions about this medicine, talk to your doctor, pharmacist, or health care provider.  2020 Elsevier/Gold Standard (2014-12-15 12:00:25)  

## 2019-09-04 NOTE — Progress Notes (Signed)
09/04/2019 2:35 PM   Carlos Nelson 01-10-1951 [id number]  Referring provider: Jodi Marble, MD [place],  [place] [id number]  Chief Complaint  Patient presents with  . Follow-up    HPI: F/u --   1 - Elevated PSA - PSA 4.9 03/2015 by PCP labs, recheck still elevated at 4.8 04/2015. DRE 05/2015 20gm and diffusely firm. PBX 06/2015 TRUS 35 gm, no median lobe, BENIGN with 1 core HGPIN and inflammation x 4 cores. DRE normal 05/21. PSA stable 08/21 at 4.4.   2- ED - he complains of trouble getting and maintaining an erection for several years. He has a normal libido. He hasnt tried anything for it.   3- pain - Seen for pelvic pain 05/21 and pt was considering pelvic floor PT or cysto for eval.   PMH sig for schizophrenia / bipolar / lives in assisted living (only for med monitoring, independent in all ADL's and is his own POA), COPD (not limiting), HTN. NO CV disease / blood thinners. No chest or abdominal surgeries.  Today, Carlos Nelson seen for the above.   PMH: Past Medical History:  Diagnosis Date  . Chronic respiratory failure with hypoxia ([place])   . COPD (chronic obstructive pulmonary disease) ([place])   . Hypertension   . Schizophrenia [facility])     Surgical History: Past Surgical History:  Procedure Laterality Date  . COLONOSCOPY    . COLONOSCOPY WITH PROPOFOL N/A 12/13/2016   Procedure: COLONOSCOPY WITH PROPOFOL;  Surgeon: Lollie Sails, MD;  Location: [facility] ENDOSCOPY;  Service: Endoscopy;  Laterality: N/A;  . ESOPHAGOGASTRODUODENOSCOPY (EGD) WITH PROPOFOL N/A 12/13/2016   Procedure: ESOPHAGOGASTRODUODENOSCOPY (EGD) WITH PROPOFOL;  Surgeon: Lollie Sails, MD;  Location: [facility] ENDOSCOPY;  Service: Endoscopy;  Laterality: N/A;  . left arm surgery      Home Medications:  Allergies as of 09/04/2019      Reactions   Ace Inhibitors Swelling   Ok with benazapril, per grouphome      Medication List       Accurate as of September 04, 2019  2:35 PM. If you  have any questions, ask your nurse or doctor.        Abilify Maintena 400 MG Prsy prefilled syringe Generic drug: ARIPiprazole ER Inject 400 mg into the muscle every 30 (thirty) days.   albuterol 108 (90 Base) MCG/ACT inhaler Commonly known as: VENTOLIN HFA Inhale 1-2 puffs into the lungs every 6 (six) hours as needed for wheezing or shortness of breath.   albuterol (2.5 MG/3ML) 0.083% nebulizer solution Commonly known as: PROVENTIL Take 3 mLs (2.5 mg total) by nebulization every 6 (six) hours as needed for wheezing or shortness of breath.   aspirin 81 MG EC tablet Take 1 tablet (81 mg total) by mouth daily.   atorvastatin 40 MG tablet Commonly known as: LIPITOR Take 1 tablet (40 mg total) by mouth daily.   clopidogrel 75 MG tablet Commonly known as: PLAVIX Take 1 tablet (75 mg total) by mouth daily.   diltiazem 120 MG 24 hr capsule Commonly known as: CARDIZEM CD Take 1 capsule (120 mg total) by mouth daily.   divalproex 250 MG 24 hr tablet Commonly known as: DEPAKOTE ER Take 500 mg by mouth at bedtime.   donepezil 5 MG tablet Commonly known as: ARICEPT Take 5 mg by mouth at bedtime.   feeding supplement (ENSURE ENLIVE) Liqd Take 237 mLs by mouth 2 (two) times daily between meals.   haloperidol 5 MG tablet Commonly known  as: HALDOL Take 2.5 mg by mouth daily.   hydrOXYzine 50 MG tablet Commonly known as: ATARAX/VISTARIL Take 50 mg by mouth every 6 (six) hours as needed for anxiety or itching.   LORazepam 1 MG tablet Commonly known as: ATIVAN Take 1 tablet (1 mg total) by mouth every 6 (six) hours as needed for anxiety.   mometasone-formoterol 100-5 MCG/ACT Aero Commonly known as: DULERA Inhale 2 puffs into the lungs in the morning and at bedtime.   predniSONE 20 MG tablet Commonly known as: Deltasone 3 tablets daily x 4 days   QUEtiapine 400 MG tablet Commonly known as: SEROQUEL Take 800 mg by mouth at bedtime.   traZODone 150 MG tablet Commonly  known as: DESYREL Take 300 mg by mouth at bedtime.   trihexyphenidyl 5 MG tablet Commonly known as: ARTANE Take 1 tablet (5 mg total) by mouth 3 (three) times daily with meals.   vitamin B-12 1000 MCG tablet Commonly known as: CYANOCOBALAMIN Take 1,000 mcg by mouth daily.       Allergies:  Allergies  Allergen Reactions  . Ace Inhibitors Swelling    Ok with benazapril, per grouphome    Family History: Family History  Problem Relation Age of Onset  . Prostate cancer Neg Hx   . Bladder Cancer Neg Hx     Social History:  reports that he has been smoking cigarettes. He has been smoking about 1.00 pack per day. He has never used smokeless tobacco. He reports current alcohol use. He reports previous drug use. Drug: Marijuana.   Physical Exam: BP (!) 165/97 (BP Location: Left Arm, Patient Position: Sitting, Cuff Size: Normal)   Pulse 86   Ht 5\' 2"  (1.575 m)   Wt 121 lb 6.4 oz (55.1 kg)   BMI 22.20 kg/m   Constitutional:  Alert and oriented, No acute distress. HEENT: [place] AT, moist mucus membranes.  Trachea midline, no masses. Cardiovascular: No clubbing, cyanosis, or edema. Respiratory: Normal respiratory effort, no increased work of breathing. GI: Abdomen is soft, nontender, nondistended, no abdominal masses GU: No CVA tenderness Lymph: No cervical or inguinal lymphadenopathy. Skin: No rashes, bruises or suspicious lesions. Neurologic: Grossly intact, no focal deficits, moving all 4 extremities. Psychiatric: Normal mood and affect.  Laboratory Data: Lab Results  Component Value Date   WBC 6.8 08/29/2019   HGB 13.3 08/29/2019   HCT 38.4 (L) 08/29/2019   MCV 87.1 08/29/2019   PLT 223 08/29/2019    Lab Results  Component Value Date   CREATININE 0.93 08/29/2019    No results found for: PSA  No results found for: TESTOSTERONE  Lab Results  Component Value Date   HGBA1C 5.7 (H) 06/10/2019    Urinalysis    Component Value Date/Time   COLORURINE STRAW (A)  08/03/2019 0314   APPEARANCEUR CLEAR (A) 08/03/2019 0314   APPEARANCEUR Clear 05/29/2019 1426   LABSPEC 1.010 08/03/2019 0314   LABSPEC 1.014 11/09/2013 1525   PHURINE 7.0 08/03/2019 0314   GLUCOSEU NEGATIVE 08/03/2019 0314   GLUCOSEU Negative 11/09/2013 1525   HGBUR NEGATIVE 08/03/2019 0314   BILIRUBINUR NEGATIVE 08/03/2019 0314   BILIRUBINUR Negative 05/29/2019 1426   BILIRUBINUR Negative 11/09/2013 [place] 08/03/2019 0314   PROTEINUR NEGATIVE 08/03/2019 0314   NITRITE NEGATIVE 08/03/2019 0314   LEUKOCYTESUR NEGATIVE 08/03/2019 0314   LEUKOCYTESUR Negative 11/09/2013 1525    Lab Results  Component Value Date   LABMICR See below: 05/29/2019   WBCUA 0-5 05/29/2019   LABEPIT None seen  05/29/2019   BACTERIA RARE (A) 08/03/2019    Pertinent Imaging: N/a  No results found for this or any previous visit.  No results found for this or any previous visit.  No results found for this or any previous visit.  No results found for this or any previous visit.  No results found for this or any previous visit.  No results found for this or any previous visit.  No results found for this or any previous visit.  No results found for this or any previous visit.   Assessment & Plan:    1. Pelvic pain - stable  Stable on surveillance  - Urinalysis, Complete  2. ED - I discussed the nature r/b/a to pde5i and he will start sildenafil.   3. PSA - discussed PSA level. PSA stable over several years and DRE recently normal. Will follow.    No follow-ups on file.  Festus Aloe, MD  [facility] [address] [place], [place] [address] [phone]

## 2019-09-08 LAB — MICROSCOPIC EXAMINATION: Bacteria, UA: NONE SEEN

## 2019-09-08 LAB — URINALYSIS, COMPLETE
Bilirubin, UA: NEGATIVE
Glucose, UA: NEGATIVE
Ketones, UA: NEGATIVE
Leukocytes,UA: NEGATIVE
Nitrite, UA: NEGATIVE
Protein,UA: NEGATIVE
RBC, UA: NEGATIVE
Specific Gravity, UA: 1.025 (ref 1.005–1.030)
Urobilinogen, Ur: 0.2 mg/dL (ref 0.2–1.0)
pH, UA: 5.5 (ref 5.0–7.5)

## 2019-10-07 ENCOUNTER — Emergency Department: Payer: Medicare Other

## 2019-10-07 ENCOUNTER — Other Ambulatory Visit: Payer: Self-pay

## 2019-10-07 ENCOUNTER — Emergency Department
Admission: EM | Admit: 2019-10-07 | Discharge: 2019-10-07 | Disposition: A | Discharge status: Home / Self Care | Payer: Medicare Other | Attending: Emergency Medicine | Admitting: Emergency Medicine

## 2019-10-07 DIAGNOSIS — F17210 Nicotine dependence, cigarettes, uncomplicated: Secondary | ICD-10-CM | POA: Diagnosis not present

## 2019-10-07 DIAGNOSIS — M25511 Pain in right shoulder: Secondary | ICD-10-CM

## 2019-10-07 DIAGNOSIS — J449 Chronic obstructive pulmonary disease, unspecified: Secondary | ICD-10-CM | POA: Diagnosis not present

## 2019-10-07 DIAGNOSIS — K6289 Other specified diseases of anus and rectum: Secondary | ICD-10-CM | POA: Diagnosis not present

## 2019-10-07 DIAGNOSIS — I10 Essential (primary) hypertension: Secondary | ICD-10-CM | POA: Insufficient documentation

## 2019-10-07 LAB — CBC
HCT: 44.4 % (ref 39.0–52.0)
Hemoglobin: 15 g/dL (ref 13.0–17.0)
MCH: 29.1 pg (ref 26.0–34.0)
MCHC: 33.8 g/dL (ref 30.0–36.0)
MCV: 86 fL (ref 80.0–100.0)
Platelets: 172 10*3/uL (ref 150–400)
RBC: 5.16 MIL/uL (ref 4.22–5.81)
RDW: 13.3 % (ref 11.5–15.5)
WBC: 8.1 10*3/uL (ref 4.0–10.5)
nRBC: 0 % (ref 0.0–0.2)

## 2019-10-07 LAB — ACETAMINOPHEN LEVEL: Acetaminophen (Tylenol), Serum: <10 ug/mL — ABNORMAL LOW (ref 10–30)

## 2019-10-07 LAB — COMPREHENSIVE METABOLIC PANEL
ALT: 29 U/L (ref 0–44)
AST: 33 U/L (ref 15–41)
Albumin: 4.2 g/dL (ref 3.5–5.0)
Alkaline Phosphatase: 85 U/L (ref 38–126)
Anion gap: 10 (ref 5–15)
BUN: 18 mg/dL (ref 8–23)
CO2: 27 mmol/L (ref 22–32)
Calcium: 9.6 mg/dL (ref 8.9–10.3)
Chloride: 105 mmol/L (ref 98–111)
Creatinine, Ser: 0.95 mg/dL (ref 0.61–1.24)
GFR calc Af Amer: >60 mL/min (ref >60)
GFR calc non Af Amer: >60 mL/min (ref >60)
Glucose, Bld: 82 mg/dL (ref 70–99)
Potassium: 4 mmol/L (ref 3.5–5.1)
Sodium: 142 mmol/L (ref 135–145)
Total Bilirubin: 0.5 mg/dL (ref 0.3–1.2)
Total Protein: 7.3 g/dL (ref 6.5–8.1)

## 2019-10-07 LAB — URINE DRUG SCREEN, QUALITATIVE (ARMC ONLY)
Amphetamines, Ur Screen: NOT DETECTED
Barbiturates, Ur Screen: NOT DETECTED
Benzodiazepine, Ur Scrn: NOT DETECTED
Cannabinoid 50 Ng, Ur ~~LOC~~: NOT DETECTED
Cocaine Metabolite,Ur ~~LOC~~: NOT DETECTED
MDMA (Ecstasy)Ur Screen: NOT DETECTED
Methadone Scn, Ur: NOT DETECTED
Opiate, Ur Screen: NOT DETECTED
Phencyclidine (PCP) Ur S: NOT DETECTED
Tricyclic, Ur Screen: POSITIVE — AB

## 2019-10-07 LAB — SALICYLATE LEVEL: Salicylate Lvl: <7 mg/dL — ABNORMAL LOW (ref 7.0–30.0)

## 2019-10-07 LAB — ETHANOL: Alcohol, Ethyl (B): <10 mg/dL (ref <10)

## 2019-10-07 MED ORDER — ACETAMINOPHEN 500 MG PO TABS
1000.0000 mg | ORAL_TABLET | Freq: Once | ORAL | Status: AC
  Administered 2019-10-07: 1000 mg via ORAL
  Filled 2019-10-07: qty 2

## 2019-10-07 NOTE — ED Notes (Signed)
RN attempted to contact [facility] staff at this time to notify pt here, and seek any additional information. No answer at this time

## 2019-10-07 NOTE — ED Triage Notes (Addendum)
Pt states he was brought to ed by Engineer, structural. Pt reports when in bed this morning he felt like someone "shoved something up my anus". Pt states no one was there with him.  Pt denies SI, or HI. Speech is pressures, and jumbled during triage. RN had to ask pt to repeat himself multiple times to understand what he was saying, fidgeting with hands and moving a lot during triage. Pt also reports rt arm pain, and states his arm "fell off in the floor this morning", no trauma or injury noted to the arm.

## 2019-10-07 NOTE — ED Notes (Signed)
Patient reports decrease in pain to right arm and shoulder. 3/10

## 2019-10-07 NOTE — ED Notes (Signed)
Pt dressed out into hospital provided clothing with this tech and Lorrie, RN in the rm. Pt belongings consist of a pair of white shoes, white socks, red/white shorts, white shirt and green boxers. Pt denies having a wallet or cell phone on him today. Pt calm and cooperative while dressing out. Pt belongings placed into a pt belongings bag and labeled with pt name.

## 2019-10-07 NOTE — ED Notes (Signed)
Sister on contact list called to be be notified about patients discharge.

## 2019-10-07 NOTE — Discharge Instructions (Addendum)
Please seek medical attention for any high fevers, chest pain, shortness of breath, change in behavior, persistent vomiting, bloody stool or any other new or concerning symptoms.  
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
## 2019-10-07 NOTE — ED Notes (Signed)
[facility] called will arrange for patient to be picked up.

## 2019-10-15 ENCOUNTER — Encounter: Payer: Self-pay | Admitting: Emergency Medicine

## 2019-10-15 ENCOUNTER — Emergency Department: Payer: Medicare Other

## 2019-10-15 ENCOUNTER — Other Ambulatory Visit: Payer: Self-pay

## 2019-10-15 ENCOUNTER — Emergency Department
Admission: EM | Admit: 2019-10-15 | Discharge: 2019-10-15 | Disposition: A | Discharge status: Home / Self Care | Payer: Medicare Other | Attending: Emergency Medicine | Admitting: Emergency Medicine

## 2019-10-15 DIAGNOSIS — Z79899 Other long term (current) drug therapy: Secondary | ICD-10-CM | POA: Insufficient documentation

## 2019-10-15 DIAGNOSIS — Z20822 Contact with and (suspected) exposure to covid-19: Secondary | ICD-10-CM | POA: Diagnosis not present

## 2019-10-15 DIAGNOSIS — Z7951 Long term (current) use of inhaled steroids: Secondary | ICD-10-CM | POA: Insufficient documentation

## 2019-10-15 DIAGNOSIS — R0789 Other chest pain: Secondary | ICD-10-CM | POA: Diagnosis present

## 2019-10-15 DIAGNOSIS — F17210 Nicotine dependence, cigarettes, uncomplicated: Secondary | ICD-10-CM | POA: Diagnosis not present

## 2019-10-15 DIAGNOSIS — J441 Chronic obstructive pulmonary disease with (acute) exacerbation: Secondary | ICD-10-CM | POA: Diagnosis not present

## 2019-10-15 DIAGNOSIS — I10 Essential (primary) hypertension: Secondary | ICD-10-CM | POA: Insufficient documentation

## 2019-10-15 DIAGNOSIS — Z7982 Long term (current) use of aspirin: Secondary | ICD-10-CM | POA: Diagnosis not present

## 2019-10-15 LAB — RESPIRATORY PANEL BY RT PCR (FLU A&B, COVID)
Influenza A by PCR: NEGATIVE
Influenza B by PCR: NEGATIVE
SARS Coronavirus 2 by RT PCR: NEGATIVE

## 2019-10-15 LAB — COMPREHENSIVE METABOLIC PANEL
ALT: 25 U/L (ref 0–44)
AST: 26 U/L (ref 15–41)
Albumin: 4.2 g/dL (ref 3.5–5.0)
Alkaline Phosphatase: 78 U/L (ref 38–126)
Anion gap: 10 (ref 5–15)
BUN: 15 mg/dL (ref 8–23)
CO2: 27 mmol/L (ref 22–32)
Calcium: 9.5 mg/dL (ref 8.9–10.3)
Chloride: 104 mmol/L (ref 98–111)
Creatinine, Ser: 0.99 mg/dL (ref 0.61–1.24)
GFR calc Af Amer: >60 mL/min (ref >60)
GFR calc non Af Amer: >60 mL/min (ref >60)
Glucose, Bld: 106 mg/dL — ABNORMAL HIGH (ref 70–99)
Potassium: 3.8 mmol/L (ref 3.5–5.1)
Sodium: 141 mmol/L (ref 135–145)
Total Bilirubin: 0.5 mg/dL (ref 0.3–1.2)
Total Protein: 7.3 g/dL (ref 6.5–8.1)

## 2019-10-15 LAB — CBC WITH DIFFERENTIAL/PLATELET
Abs Immature Granulocytes: 0.03 10*3/uL (ref 0.00–0.07)
Basophils Absolute: 0 10*3/uL (ref 0.0–0.1)
Basophils Relative: 0 %
Eosinophils Absolute: 0.1 10*3/uL (ref 0.0–0.5)
Eosinophils Relative: 1 %
HCT: 44 % (ref 39.0–52.0)
Hemoglobin: 14.4 g/dL (ref 13.0–17.0)
Immature Granulocytes: 0 %
Lymphocytes Relative: 29 %
Lymphs Abs: 2 10*3/uL (ref 0.7–4.0)
MCH: 28.8 pg (ref 26.0–34.0)
MCHC: 32.7 g/dL (ref 30.0–36.0)
MCV: 88 fL (ref 80.0–100.0)
Monocytes Absolute: 0.5 10*3/uL (ref 0.1–1.0)
Monocytes Relative: 8 %
Neutro Abs: 4.3 10*3/uL (ref 1.7–7.7)
Neutrophils Relative %: 62 %
Platelets: 210 10*3/uL (ref 150–400)
RBC: 5 MIL/uL (ref 4.22–5.81)
RDW: 13.3 % (ref 11.5–15.5)
WBC: 6.9 10*3/uL (ref 4.0–10.5)
nRBC: 0 % (ref 0.0–0.2)

## 2019-10-15 LAB — TROPONIN I (HIGH SENSITIVITY)
Troponin I (High Sensitivity): 7 ng/L (ref <18)
Troponin I (High Sensitivity): 8 ng/L (ref <18)

## 2019-10-15 LAB — FIBRIN DERIVATIVES D-DIMER (ARMC ONLY): Fibrin derivatives D-dimer (ARMC): 677.43 ng/mL (FEU) — ABNORMAL HIGH (ref 0.00–499.00)

## 2019-10-15 MED ORDER — IOHEXOL 350 MG/ML SOLN
75.0000 mL | Freq: Once | INTRAVENOUS | Status: AC | PRN
  Administered 2019-10-15: 75 mL via INTRAVENOUS

## 2019-10-15 MED ORDER — PREDNISONE 20 MG PO TABS
40.0000 mg | ORAL_TABLET | Freq: Once | ORAL | Status: AC
  Administered 2019-10-15: 40 mg via ORAL
  Filled 2019-10-15: qty 2

## 2019-10-15 MED ORDER — ALBUTEROL SULFATE HFA 108 (90 BASE) MCG/ACT IN AERS
4.0000 | INHALATION_SPRAY | Freq: Once | RESPIRATORY_TRACT | Status: AC
  Administered 2019-10-15: 4 via RESPIRATORY_TRACT
  Filled 2019-10-15: qty 6.7

## 2019-10-15 MED ORDER — AZITHROMYCIN 250 MG PO TABS: ORAL_TABLET | ORAL | 0 refills | Status: DC

## 2019-10-15 MED ORDER — PREDNISONE 50 MG PO TABS: ORAL_TABLET | ORAL | 0 refills | Status: DC

## 2019-10-15 NOTE — ED Notes (Signed)
Pt sitting in the [place] , in NAD, pt noted to have a steady gait with no distress noted.

## 2019-10-15 NOTE — ED Notes (Signed)
Says he has upper chest pain.  Says productive cough x 1 week.  He is alert and was able to ambulated without difficulty.  Says upper abd is "sore."

## 2019-10-15 NOTE — ED Provider Notes (Signed)
[facility] Emergency Department Provider Note   ____________________________________________   First MD Initiated Contact with Patient 10/15/19 0845     (approximate)  I have reviewed the triage vital signs and the nursing notes.   HISTORY  Chief Complaint Chest Pain    HPI Carlos Nelson is a 69 y.o. male history of COPD hypertension schizophrenia  Patient reports he lives at family care home.  For the last  day he has felt some upper chest discomfort hard to describe, and a little short of breath.  Reports same symptoms in the past with COPD.  Denies any fever.  No vomiting.  Slight shortness of breath, reports uses inhaler at home that helps.  No abdominal pain.  No leg swelling.  Reports the chest discomfort feels like an achiness across the upper chest.  Denies history of blood clots.  He does occasionally get chest pain when he walks long distances, up to 4 miles, however reports has been ongoing for several years and he sees Dr. Chancy Milroy from cardiology for this.   Denies Covid.  Reports he gets tested every month at the group home and his last test was negative.  Past Medical History:  Diagnosis Date  . Chronic respiratory failure with hypoxia ([place])   . COPD (chronic obstructive pulmonary disease) ([place])   . Hypertension   . Schizophrenia [facility])     Patient Active Problem List   Diagnosis Date Noted  . Scrotum pain   . Elevated troponin   . Demand ischemia ([place]) 06/11/2019  . Malnutrition of moderate degree 06/10/2019  . NSTEMI (non-ST elevated myocardial infarction) ([place]) 06/09/2019  . Depression with anxiety 06/09/2019  . Pleuritic chest pain 05/20/2019  . CAP (community acquired pneumonia) 05/20/2019  . Hypertension   . COPD exacerbation ([place])   . Acute respiratory failure with hypoxia ([place])   . Altered mental status 09/25/2018  . Schizophrenia ([place])   . Tachycardia 08/21/2018  . Schizophrenia, chronic with acute exacerbation ([place])  08/17/2018  . Elevated PSA 05/24/2015    Past Surgical History:  Procedure Laterality Date  . COLONOSCOPY    . COLONOSCOPY WITH PROPOFOL N/A 12/13/2016   Procedure: COLONOSCOPY WITH PROPOFOL;  Surgeon: Lollie Sails, MD;  Location: [facility] ENDOSCOPY;  Service: Endoscopy;  Laterality: N/A;  . ESOPHAGOGASTRODUODENOSCOPY (EGD) WITH PROPOFOL N/A 12/13/2016   Procedure: ESOPHAGOGASTRODUODENOSCOPY (EGD) WITH PROPOFOL;  Surgeon: Lollie Sails, MD;  Location: [facility] ENDOSCOPY;  Service: Endoscopy;  Laterality: N/A;  . left arm surgery      Prior to Admission medications   Medication Sig Start Date End Date Taking? Authorizing Provider  albuterol (PROVENTIL) (2.5 MG/3ML) 0.083% nebulizer solution Take 3 mLs (2.5 mg total) by nebulization every 6 (six) hours as needed for wheezing or shortness of breath. 06/23/19   Merlyn Lot, MD  albuterol (VENTOLIN HFA) 108 (90 Base) MCG/ACT inhaler Inhale 1-2 puffs into the lungs every 6 (six) hours as needed for wheezing or shortness of breath.    [provider]  ARIPiprazole ER (ABILIFY MAINTENA) 400 MG PRSY prefilled syringe Inject 400 mg into the muscle every 30 (thirty) days. Patient not taking: Reported on 09/04/2019    [provider]  aspirin EC 81 MG EC tablet Take 1 tablet (81 mg total) by mouth daily. Patient not taking: Reported on 09/04/2019 06/13/19   Loletha Grayer, MD  atorvastatin (LIPITOR) 40 MG tablet Take 1 tablet (40 mg total) by mouth daily. Patient not taking: Reported on 09/04/2019 06/13/19  Loletha Grayer, MD  azithromycin (ZITHROMAX Z-PAK) 250 MG tablet 2 tabs by mouth on day 1, then 1 tab daily for next 4 days 10/15/19   Delman Kitten, MD  clopidogrel (PLAVIX) 75 MG tablet Take 1 tablet (75 mg total) by mouth daily. Patient not taking: Reported on 09/04/2019 06/13/19   Loletha Grayer, MD  diltiazem (CARDIZEM CD) 120 MG 24 hr capsule Take 1 capsule (120 mg total) by mouth daily. Patient not taking: Reported on  09/04/2019 06/12/19   Loletha Grayer, MD  divalproex (DEPAKOTE ER) 250 MG 24 hr tablet Take 500 mg by mouth at bedtime. Patient not taking: Reported on 09/04/2019    [provider]  donepezil (ARICEPT) 5 MG tablet Take 5 mg by mouth at bedtime. Patient not taking: Reported on 09/04/2019    [provider]  feeding supplement, ENSURE ENLIVE, (ENSURE ENLIVE) LIQD Take 237 mLs by mouth 2 (two) times daily between meals. 06/13/19   Loletha Grayer, MD  haloperidol (HALDOL) 5 MG tablet Take 2.5 mg by mouth daily. Patient not taking: Reported on 09/04/2019    [provider]  hydrOXYzine (ATARAX/VISTARIL) 50 MG tablet Take 50 mg by mouth every 6 (six) hours as needed for anxiety or itching.    [provider]  LORazepam (ATIVAN) 1 MG tablet Take 1 tablet (1 mg total) by mouth every 6 (six) hours as needed for anxiety. 08/22/18   Gouru, Illene Silver, MD  mometasone-formoterol (DULERA) 100-5 MCG/ACT AERO Inhale 2 puffs into the lungs in the morning and at bedtime. 06/12/19   Loletha Grayer, MD  predniSONE (DELTASONE) 20 MG tablet 3 tablets daily x 4 days Patient not taking: Reported on 09/04/2019 08/03/19   Paulette Blanch, MD  predniSONE (DELTASONE) 50 MG tablet 1 tab daily starting 10/15/2019 10/15/19   Delman Kitten, MD  QUEtiapine (SEROQUEL) 400 MG tablet Take 800 mg by mouth at bedtime. Patient not taking: Reported on 09/04/2019    [provider]  sildenafil (REVATIO) 20 MG tablet Take 1 tablet (20 mg total) by mouth daily. Take 1-5 tablets as needed 09/04/19   Festus Aloe, MD  traZODone (DESYREL) 150 MG tablet Take 300 mg by mouth at bedtime.     [provider]  trihexyphenidyl (ARTANE) 5 MG tablet Take 1 tablet (5 mg total) by mouth 3 (three) times daily with meals. Patient not taking: Reported on 09/04/2019 05/22/19   Lorella Nimrod, MD  vitamin B-12 (CYANOCOBALAMIN) 1000 MCG tablet Take 1,000 mcg by mouth daily.    [provider]     Allergies Ace inhibitors  Family History  Problem Relation Age of Onset  . Prostate cancer Neg Hx   . Bladder Cancer Neg Hx     Social History Social History   Tobacco Use  . Smoking status: Current Every Day Smoker    Packs/day: 1.00    Types: Cigarettes  . Smokeless tobacco: Never Used  Substance Use Topics  . Alcohol use: Yes    Alcohol/week: 0.0 standard drinks    Comment: beer  . Drug use: Not Currently    Types: Marijuana    Review of Systems Constitutional: No fever/chills Eyes: No visual changes. ENT: No sore throat. Cardiovascular: See HPI Respiratory: See HPI Gastrointestinal: No abdominal pain.   Genitourinary: Negative for dysuria. Musculoskeletal: Negative for back pain. Skin: Negative for rash. Neurological: Negative for headaches, areas of focal weakness or numbness.    ____________________________________________   PHYSICAL EXAM:  VITAL SIGNS: ED Triage Vitals  Enc Vitals  Group     BP 10/15/19 0235 (!) 143/103     Pulse Rate 10/15/19 0235 (!) 122     Resp 10/15/19 0235 (!) 26     Temp 10/15/19 0235 98.2 F (36.8 C)     Temp Source 10/15/19 0235 Oral     SpO2 10/15/19 0235 98 %     Weight 10/15/19 0235 129 lb (58.5 kg)     Height 10/15/19 0235 5\' 3"  (1.6 m)     Head Circumference --      Peak Flow --      Pain Score 10/15/19 0243 7     Pain Loc --      Pain Edu? --      Excl. in [place]? --     Constitutional: Alert and oriented. Well appearing and in no acute distress.  Ambulatory to the bathroom without difficulty. Eyes: Conjunctivae are normal. Head: Atraumatic. Nose: No congestion/rhinnorhea. Mouth/Throat: Mucous membranes are moist. Neck: No stridor.  Cardiovascular: Normal rate, regular rhythm. Grossly normal heart sounds.  Good peripheral circulation. Respiratory: Normal respiratory effort.  No retractions. Lungs CTAB except for some very faint slight end expiratory wheezing.  Occasional slight dry cough.  Normal  respiratory effort. Gastrointestinal: Soft and nontender. No distention. Musculoskeletal: No lower extremity tenderness nor edema. Neurologic:  Normal speech and language. No gross focal neurologic deficits are appreciated.  Skin:  Skin is warm, dry and intact. No rash noted. Psychiatric: Mood and affect are somewhat flat, but pleasant and well oriented. Speech and behavior are normal.  ____________________________________________   LABS (all labs ordered are listed, but only abnormal results are displayed)  Labs Reviewed  COMPREHENSIVE METABOLIC PANEL - Abnormal; Notable for the following components:      Result Value   Glucose, Bld 106 (*)    All other components within normal limits  FIBRIN DERIVATIVES D-DIMER ([facility] ONLY) - Abnormal; Notable for the following components:   Fibrin derivatives D-dimer ([facility]) 677.43 (*)    All other components within normal limits  RESPIRATORY PANEL BY RT PCR (FLU A&B, COVID)  CBC WITH DIFFERENTIAL/PLATELET  TROPONIN I (HIGH SENSITIVITY)  TROPONIN I (HIGH SENSITIVITY)   ____________________________________________  EKG  Reviewed and interpreted at 9 AM Heart rate 120 QRS 90 QTc 460 Baseline artifact, sinus tachycardia.  No evidence of acute ischemia   ----------------------------------------- 9:29 AM on 10/15/2019 -----------------------------------------  Second EKG reviewed at 921 Heart rate 95 QRS 90 QTc 440 Normal sinus rhythm, ST segments seems somewhat pronounced with questionable mild PR depression and very slight ST repolarization-like abnormality noted in 1 and aVL.  Compared to prior there does appear to be a slight change, though looking through historical EKGs I do think there are some with similar morphologies.  No STEMI ____________________________________________  RADIOLOGY  DG Chest 2 View  Result Date: 10/15/2019 CLINICAL DATA:  Shortness of breath and chest pain EXAM: CHEST - 2 VIEW COMPARISON:  The 1421 FINDINGS:  The lungs are hyperinflated with diffuse interstitial prominence. No focal airspace consolidation or pulmonary edema. No pleural effusion or pneumothorax. Normal cardiomediastinal contours. IMPRESSION: COPD without acute airspace disease. Electronically Signed   By: Ulyses Jarred M.D.   On: 10/15/2019 03:04   CT Angio Chest PE W and/or Wo Contrast  Result Date: 10/15/2019 CLINICAL DATA:  Concern for pulmonary embolism. EXAM: CT ANGIOGRAPHY CHEST WITH CONTRAST TECHNIQUE: Multidetector CT imaging of the chest was performed using the standard protocol during bolus administration of intravenous contrast. Multiplanar CT image reconstructions and MIPs  were obtained to evaluate the vascular anatomy. CONTRAST:  70mL OMNIPAQUE IOHEXOL 350 MG/ML SOLN COMPARISON:  None. FINDINGS: Cardiovascular: No filling defects within the pulmonary arteries to suggest acute pulmonary embolism. No significant vascular findings. Normal heart size. No pericardial effusion. Mediastinum/Nodes: No axillary supraclavicular adenopathy no mediastinal or hilar adenopathy no pericardial fluid. Lungs/Pleura: Centrilobular emphysema in the upper lobes. No suspicious pulmonary nodules. No pulmonary infarction. No pneumonia. Upper Abdomen: Limited view of the liver, kidneys, pancreas are unremarkable. Normal adrenal glands. Musculoskeletal: No aggressive osseous lesion. Chronic compression deformities in the midthoracic spine. Review of the MIP images confirms the above findings. IMPRESSION: 1. No evidence acute pulmonary embolism. 2. No acute pulmonary parenchymal findings. 3.  Emphysema (ICD10-J43.9). Electronically Signed   By: Suzy Bouchard M.D.   On: 10/15/2019 12:36    Imaging reviewed including CT of the chest.  No acute pulmonary embolism.  No acute parenchymal findings ____________________________________________   PROCEDURES  Procedure(s) performed: None  Procedures  Critical Care performed:  No  ____________________________________________   INITIAL IMPRESSION / ASSESSMENT AND PLAN / ED COURSE  Pertinent labs & imaging results that were available during my care of the patient were reviewed by me and considered in my medical decision making (see chart for details).   Differential diagnosis includes, but is not limited to, ACS, aortic dissection, pulmonary embolism, cardiac tamponade, pneumothorax, pneumonia, pericarditis, myocarditis, GI-related causes including esophagitis/gastritis, and musculoskeletal chest wall pain.  Given his associated cough and slight wheezing, I suspect with his troponins being normal review of his chest x-ray and clinical history this is likely COPD.  Wish to exclude pulmonary embolism using D-dimer, he does not appear to have significant risk factors for pulmonary embolism he is a smoker.  Denies pleuritic type chest pain.   Clinical Course as of Oct 15 1426  Thu Oct 15, 2019  0927 Paged Dr. Laurelyn Sickle (his cardiologist) to review history and his EKG. Today's EKG seems to have a similar morphology compared with priors, but at the same time his ST segments appear more pronounced... however 2 normal troponins and not a typical radiating pressure like pain of ACS.    [MQ]  [phone] Discussed EKG and history with Dr. Humphrey Rolls, saw him in July 2021. Had normal CT coronary with calcium score 27, also normal EF on echo, and Dr. Humphrey Rolls advises EKG findings at that time also noted ST abnormality in AvL. Dr. Humphrey Rolls advises ok to follow-up with him tomorrow from cardiology persepctive.    [MQ]    Clinical Course User Index [MQ] Delman Kitten, MD   ----------------------------------------- 2:34 PM on 10/15/2019 -----------------------------------------  Patient feels well, ambulatory in no distress.  Resting comfortably lungs clear.  CT negative for PE or pneumonia.  Patient appears to be improved, chest discomfort improved.  Will be following up with his physician, and given  prescription for additional days of prednisone, azithromycin, and patient given albuterol inhaler and reports he has nebulizers at his care home as well  Stable and appropriate for discharge  Return precautions and treatment recommendations and follow-up discussed with the patient who is agreeable with the plan.  ____________________________________________   FINAL CLINICAL IMPRESSION(S) / ED DIAGNOSES  Final diagnoses:  COPD with acute exacerbation ([place])  Atypical chest pain        Note:  This document was prepared using Dragon voice recognition software and may include unintentional dictation errors       Delman Kitten, MD 10/15/19 1435

## 2019-10-15 NOTE — ED Notes (Signed)
BP 174/104 Dr. Jacqualine Code aware, continue with discharge

## 2019-10-15 NOTE — ED Notes (Signed)
Marcelline Mates at the [facility] [phone] has been called to let her know the pt is being discharged and needs transportation.  Discharge instructions were reviewed with her.  She is aware that he will be waiting in the lobby.    Legal guardian at [place] [phone]:  Contact attempted, message left

## 2019-10-15 NOTE — ED Triage Notes (Signed)
Patient ambulatory to triage with steady gait, without difficulty or distress noted, brought in by EMS; pt reports awoke with [facility] and upper CP, nonradiating; st hx of same and uses O2 and inhalers as needed; +smoker

## 2019-10-15 NOTE — ED Notes (Signed)
Pt assisted out of bed to go to restroom. Pt ambulated steadily to restroom and back to bed. Pt given food tray once settled and comfortable.

## 2019-10-15 NOTE — ED Notes (Signed)
Signature pad unavailable; discharge instructions reviewed with pt and pt verbalized understanding

## 2019-11-10 IMAGING — MR MR HEAD W/O CM
9 of 10 series · 43 of 48 positions shown · non-contrast
Comparison: Head CT same day

CLINICAL DATA: Auditory hallucinations. Mental status changes.
Dizziness.

EXAM:
MRI HEAD WITHOUT CONTRAST
TECHNIQUE: Multiplanar, multiecho pulse sequences of the brain and surrounding
structures were obtained without intravenous contrast.

[Series 2: ax dwi_tracew · axial · 3.0mm · 0.83mm/px · z∈[-110,+54]mm · 7 of 56 slices shown]
[im 1/56]
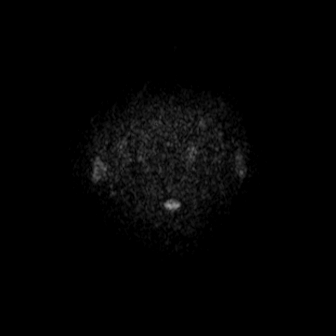
[im 10/56]
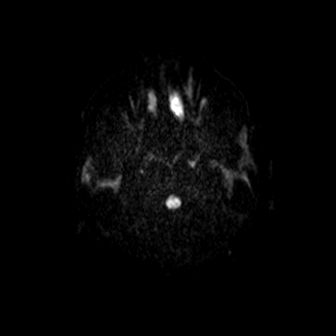
[im 19/56]
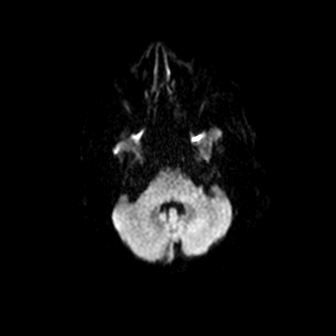
[im 28/56]
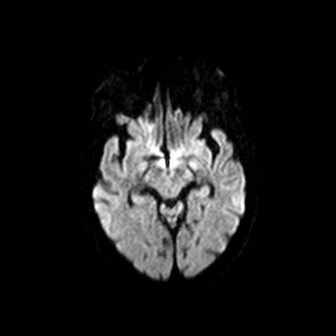
[im 37/56]
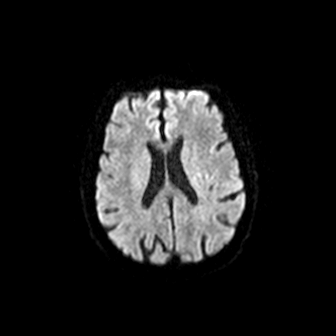
[im 46/56]
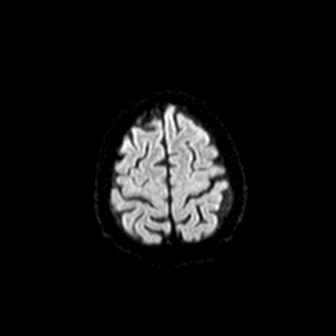
[im 56/56]
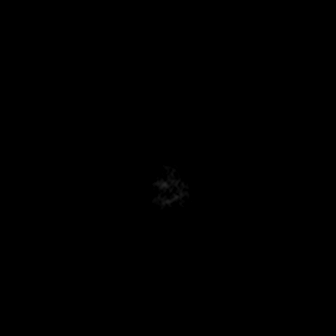

[Series 3: ax dwi_adc · axial · 3.0mm · 0.83mm/px · z∈[-110,+54]mm · 8 of 56 slices shown]
[im 1/56]
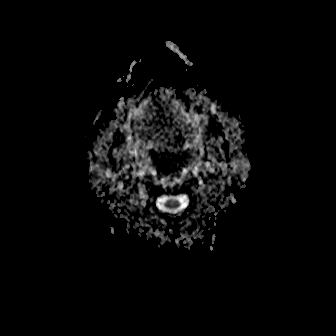
[im 8/56]
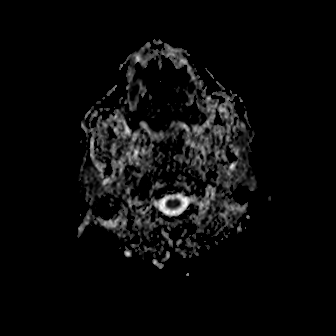
[im 16/56]
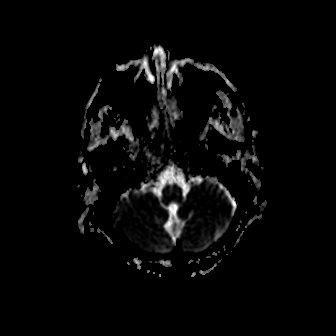
[im 24/56]
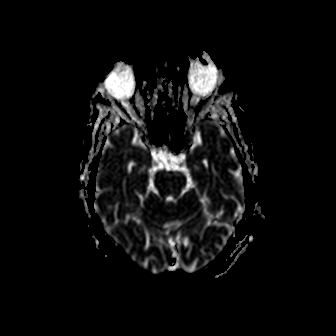
[im 32/56]
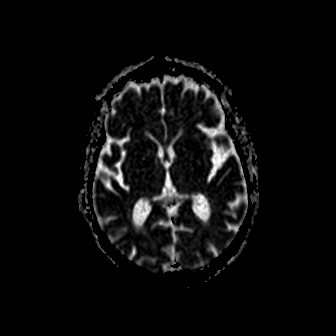
[im 40/56]
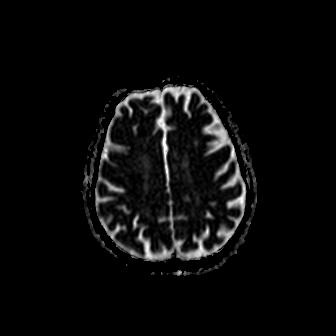
[im 48/56]
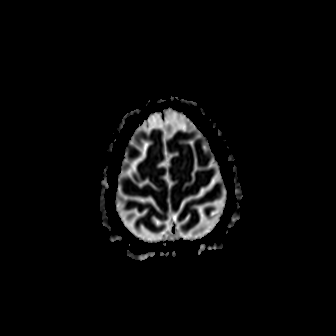
[im 56/56]
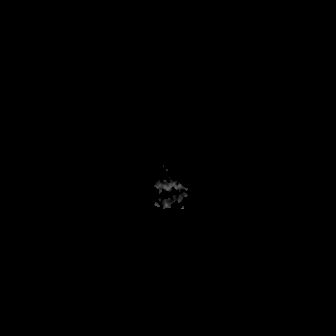

[Series 4: cor dwi_tracew · coronal · 5.0mm · 0.68mm/px · 5 of 40 slices shown]
[im 1/40]
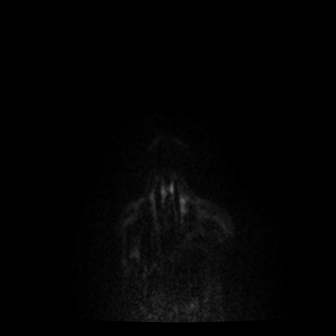
[im 10/40]
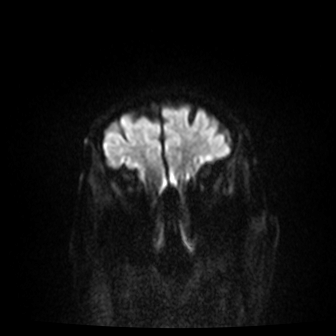
[im 20/40]
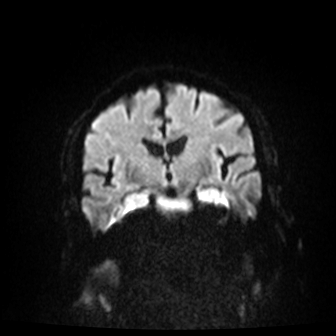
[im 30/40]
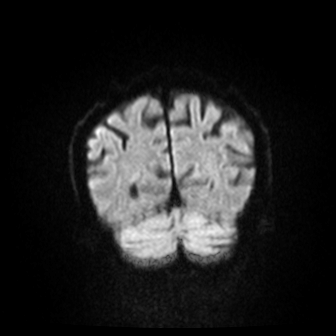
[im 40/40]
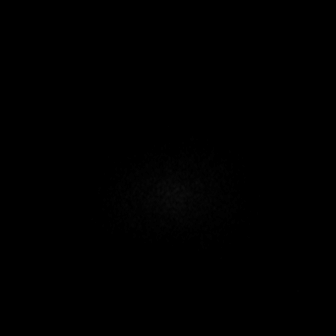

[Series 5: cor dwi_adc · coronal · 5.0mm · 0.68mm/px · 4 of 39 slices shown]
[im 1/39]
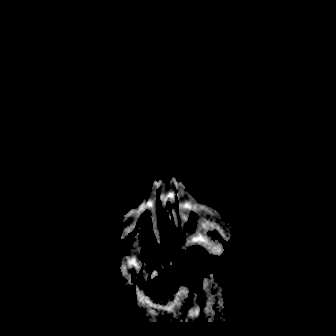
[im 10/39]
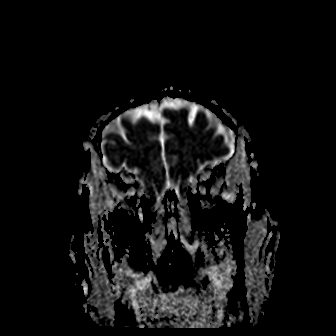
[im 20/39]
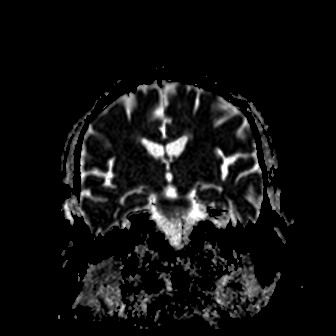
[im 29/39]
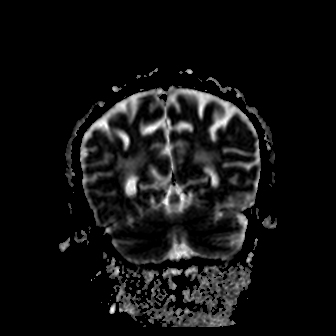

[Series 6: T1 · sagittal · 5.0mm · 0.94mm/px · 3 of 25 slices shown (1 of 2)]
[im 1/25]
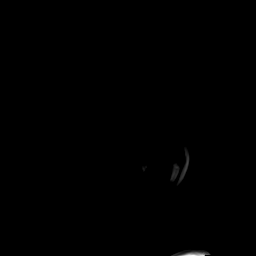
[im 13/25]
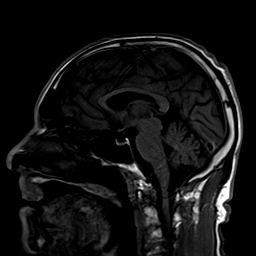
[im 25/25]
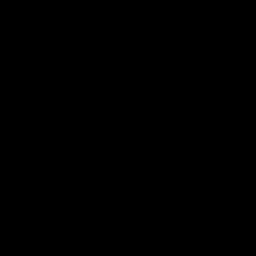

[Series 7: T2 · axial · 5.0mm · 0.45mm/px · z∈[-100,+56]mm · 4 of 27 slices shown (1 of 2)]
[im 1/27]
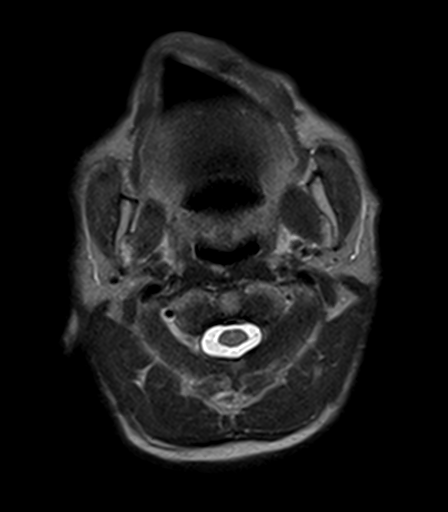
[im 9/27]
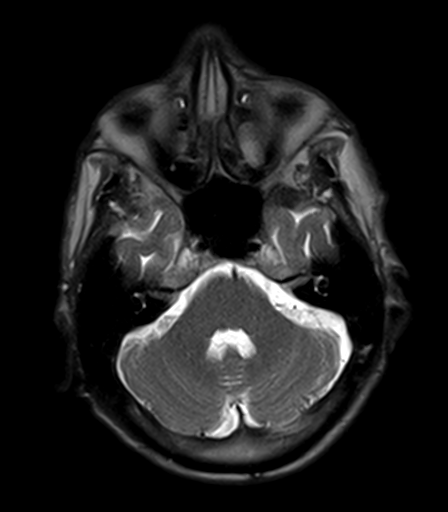
[im 18/27]
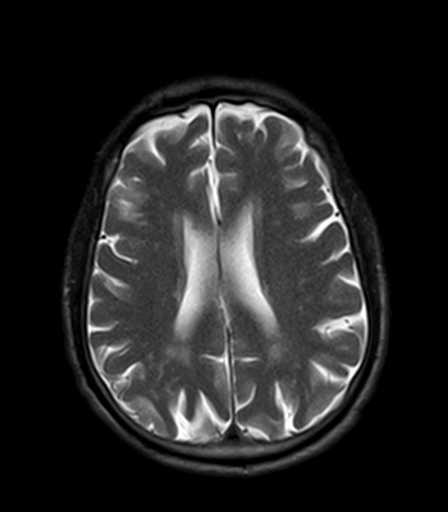
[im 27/27]
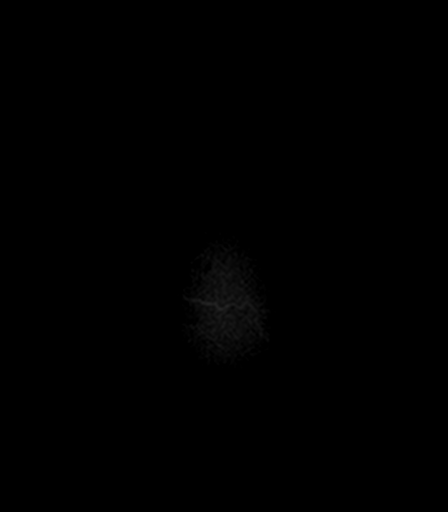

[Series 9: FLAIR · axial · 5.0mm · 1.20mm/px · z∈[-101,+55]mm · 4 of 27 slices shown]
[im 1/27]
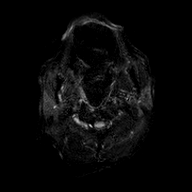
[im 9/27]
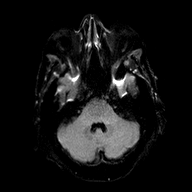
[im 18/27]
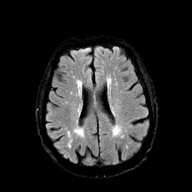
[im 27/27]
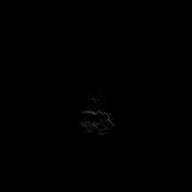

[Series 10: T1 · axial · 5.0mm · 0.90mm/px · z∈[-100,+56]mm · 4 of 27 slices shown (2 of 2)]
[im 1/27]
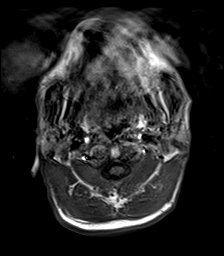
[im 9/27]
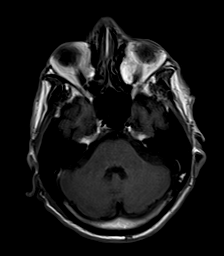
[im 18/27]
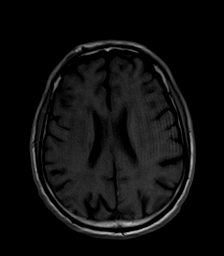
[im 27/27]
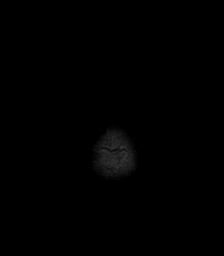

[Series 11: T2 · coronal · 5.0mm · 0.45mm/px · 4 of 31 slices shown (2 of 2)]
[im 1/31]
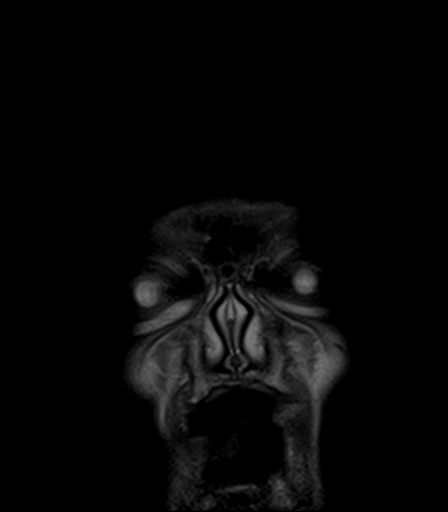
[im 11/31]
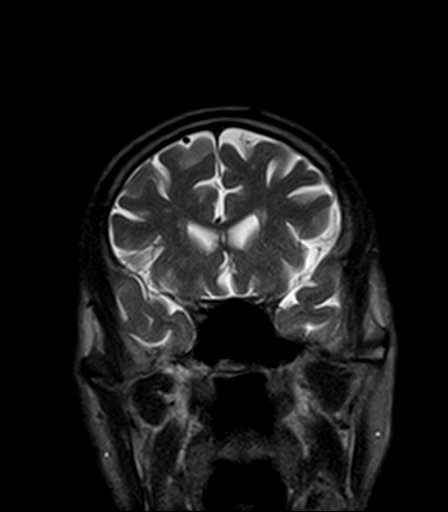
[im 21/31]
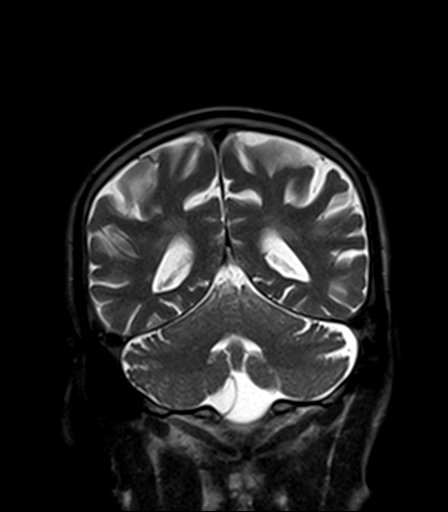
[im 31/31]
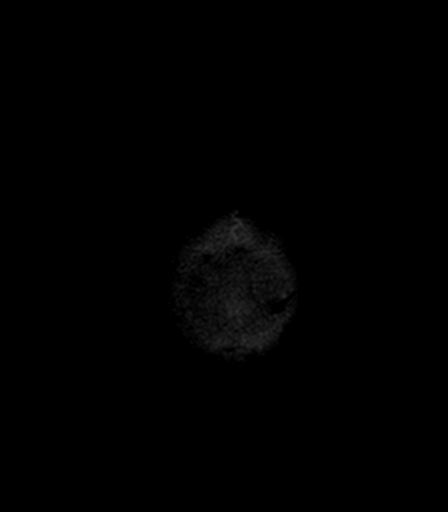

[43 of 48 positions shown; findings below may reference images not displayed]

FINDINGS: Brain: Diffusion imaging does not show any acute or subacute
infarction. The brainstem and cerebellum appear normal. Cerebral
hemispheres show moderate generalized volume loss with moderate
chronic small-vessel ischemic change of the white matter. No
cortical or large vessel territory infarction. No mass lesion,
hemorrhage, hydrocephalus or extra-axial collection.

Vascular: Major vessels at the base of the brain show flow.

Skull and upper cervical spine: Negative

Sinuses/Orbits: Clear/normal

Other: None
IMPRESSION: No acute finding. Moderate cerebral atrophy and moderate chronic
small-vessel change of the cerebral hemispheric white matter.

## 2019-12-18 ENCOUNTER — Emergency Department
Admission: EM | Admit: 2019-12-18 | Discharge: 2019-12-18 | Disposition: A | Discharge status: Home / Self Care | Payer: Medicare Other | Attending: Emergency Medicine | Admitting: Emergency Medicine

## 2019-12-18 ENCOUNTER — Encounter: Payer: Self-pay | Admitting: *Deleted

## 2019-12-18 ENCOUNTER — Other Ambulatory Visit: Payer: Self-pay

## 2019-12-18 ENCOUNTER — Emergency Department: Payer: Medicare Other

## 2019-12-18 DIAGNOSIS — K2900 Acute gastritis without bleeding: Secondary | ICD-10-CM | POA: Diagnosis not present

## 2019-12-18 DIAGNOSIS — Z7982 Long term (current) use of aspirin: Secondary | ICD-10-CM | POA: Diagnosis not present

## 2019-12-18 DIAGNOSIS — R1013 Epigastric pain: Secondary | ICD-10-CM | POA: Diagnosis present

## 2019-12-18 DIAGNOSIS — I10 Essential (primary) hypertension: Secondary | ICD-10-CM | POA: Diagnosis not present

## 2019-12-18 DIAGNOSIS — J441 Chronic obstructive pulmonary disease with (acute) exacerbation: Secondary | ICD-10-CM | POA: Diagnosis not present

## 2019-12-18 DIAGNOSIS — F17210 Nicotine dependence, cigarettes, uncomplicated: Secondary | ICD-10-CM | POA: Insufficient documentation

## 2019-12-18 DIAGNOSIS — Z79899 Other long term (current) drug therapy: Secondary | ICD-10-CM | POA: Insufficient documentation

## 2019-12-18 LAB — COMPREHENSIVE METABOLIC PANEL
ALT: 25 U/L (ref 0–44)
AST: 23 U/L (ref 15–41)
Albumin: 4.1 g/dL (ref 3.5–5.0)
Alkaline Phosphatase: 100 U/L (ref 38–126)
Anion gap: 9 (ref 5–15)
BUN: 19 mg/dL (ref 8–23)
CO2: 28 mmol/L (ref 22–32)
Calcium: 9.7 mg/dL (ref 8.9–10.3)
Chloride: 103 mmol/L (ref 98–111)
Creatinine, Ser: 1.24 mg/dL (ref 0.61–1.24)
GFR, Estimated: >60 mL/min (ref >60)
Glucose, Bld: 111 mg/dL — ABNORMAL HIGH (ref 70–99)
Potassium: 4.1 mmol/L (ref 3.5–5.1)
Sodium: 140 mmol/L (ref 135–145)
Total Bilirubin: 0.7 mg/dL (ref 0.3–1.2)
Total Protein: 7 g/dL (ref 6.5–8.1)

## 2019-12-18 LAB — URINALYSIS, COMPLETE (UACMP) WITH MICROSCOPIC
Bilirubin Urine: NEGATIVE
Glucose, UA: NEGATIVE mg/dL
Hgb urine dipstick: NEGATIVE
Ketones, ur: NEGATIVE mg/dL
Leukocytes,Ua: NEGATIVE
Nitrite: NEGATIVE
Protein, ur: NEGATIVE mg/dL
Specific Gravity, Urine: 1.017 (ref 1.005–1.030)
pH: 6 (ref 5.0–8.0)

## 2019-12-18 LAB — LIPASE, BLOOD: Lipase: 37 U/L (ref 11–51)

## 2019-12-18 LAB — CBC
HCT: 43.4 % (ref 39.0–52.0)
Hemoglobin: 14.2 g/dL (ref 13.0–17.0)
MCH: 29.5 pg (ref 26.0–34.0)
MCHC: 32.7 g/dL (ref 30.0–36.0)
MCV: 90 fL (ref 80.0–100.0)
Platelets: 213 10*3/uL (ref 150–400)
RBC: 4.82 MIL/uL (ref 4.22–5.81)
RDW: 14.5 % (ref 11.5–15.5)
WBC: 7.6 10*3/uL (ref 4.0–10.5)
nRBC: 0 % (ref 0.0–0.2)

## 2019-12-18 MED ORDER — ALUM & MAG HYDROXIDE-SIMETH 200-200-20 MG/5ML PO SUSP
30.0000 mL | Freq: Once | ORAL | Status: AC
  Administered 2019-12-18: 30 mL via ORAL
  Filled 2019-12-18: qty 30

## 2019-12-18 MED ORDER — LIDOCAINE VISCOUS HCL 2 % MT SOLN
15.0000 mL | Freq: Once | OROMUCOSAL | Status: AC
  Administered 2019-12-18: 15 mL via ORAL
  Filled 2019-12-18: qty 15

## 2019-12-18 MED ORDER — SUCRALFATE 1 G PO TABS: 1.0000 g | ORAL_TABLET | Freq: Four times a day (QID) | ORAL | 0 refills | Status: DC

## 2019-12-18 MED ORDER — PANTOPRAZOLE SODIUM 20 MG PO TBEC: 20.0000 mg | DELAYED_RELEASE_TABLET | Freq: Every day | ORAL | 1 refills | Status: AC

## 2019-12-18 NOTE — ED Provider Notes (Signed)
[facility] Emergency Department Provider Note   ____________________________________________    I have reviewed the triage vital signs and the nursing notes.   HISTORY  Chief Complaint Abdominal Pain and Emesis     HPI Carlos Nelson is a 69 y.o. male with a history as noted below who presents with complaints of burning in his epigastrium with occasional radiation up to his throat.  He reports this started while lying down.  He does not know if he has a history of acid reflux.  Currently is feeling improved, no bloating.  No fevers chills.  Does report a history of constipation.  Has not take anything for this.  Symptoms started last night  Past Medical History:  Diagnosis Date  . Chronic respiratory failure with hypoxia ([place])   . COPD (chronic obstructive pulmonary disease) ([place])   . Hypertension   . Schizophrenia [facility])     Patient Active Problem List   Diagnosis Date Noted  . Scrotum pain   . Elevated troponin   . Demand ischemia ([place]) 06/11/2019  . Malnutrition of moderate degree 06/10/2019  . NSTEMI (non-ST elevated myocardial infarction) ([place]) 06/09/2019  . Depression with anxiety 06/09/2019  . Pleuritic chest pain 05/20/2019  . CAP (community acquired pneumonia) 05/20/2019  . Hypertension   . COPD exacerbation ([place])   . Acute respiratory failure with hypoxia ([place])   . Altered mental status 09/25/2018  . Schizophrenia ([place])   . Tachycardia 08/21/2018  . Schizophrenia, chronic with acute exacerbation ([place]) 08/17/2018  . Elevated PSA 05/24/2015    Past Surgical History:  Procedure Laterality Date  . COLONOSCOPY    . COLONOSCOPY WITH PROPOFOL N/A 12/13/2016   Procedure: COLONOSCOPY WITH PROPOFOL;  Surgeon: Lollie Sails, MD;  Location: [facility] ENDOSCOPY;  Service: Endoscopy;  Laterality: N/A;  . ESOPHAGOGASTRODUODENOSCOPY (EGD) WITH PROPOFOL N/A 12/13/2016   Procedure: ESOPHAGOGASTRODUODENOSCOPY (EGD) WITH PROPOFOL;  Surgeon:  Lollie Sails, MD;  Location: [facility] ENDOSCOPY;  Service: Endoscopy;  Laterality: N/A;  . left arm surgery      Prior to Admission medications   Medication Sig Start Date End Date Taking? Authorizing Provider  albuterol (PROVENTIL) (2.5 MG/3ML) 0.083% nebulizer solution Take 3 mLs (2.5 mg total) by nebulization every 6 (six) hours as needed for wheezing or shortness of breath. 06/23/19   Merlyn Lot, MD  albuterol (VENTOLIN HFA) 108 (90 Base) MCG/ACT inhaler Inhale 1-2 puffs into the lungs every 6 (six) hours as needed for wheezing or shortness of breath.    [provider]  ARIPiprazole ER (ABILIFY MAINTENA) 400 MG PRSY prefilled syringe Inject 400 mg into the muscle every 30 (thirty) days. Patient not taking: Reported on 09/04/2019    [provider]  aspirin EC 81 MG EC tablet Take 1 tablet (81 mg total) by mouth daily. Patient not taking: Reported on 09/04/2019 06/13/19   Loletha Grayer, MD  atorvastatin (LIPITOR) 40 MG tablet Take 1 tablet (40 mg total) by mouth daily. Patient not taking: Reported on 09/04/2019 06/13/19   Loletha Grayer, MD  azithromycin (ZITHROMAX Z-PAK) 250 MG tablet 2 tabs by mouth on day 1, then 1 tab daily for next 4 days 10/15/19   Delman Kitten, MD  clopidogrel (PLAVIX) 75 MG tablet Take 1 tablet (75 mg total) by mouth daily. Patient not taking: Reported on 09/04/2019 06/13/19   Loletha Grayer, MD  diltiazem (CARDIZEM CD) 120 MG 24 hr capsule Take 1 capsule (120 mg total) by mouth daily. Patient not taking: Reported  on 09/04/2019 06/12/19   Loletha Grayer, MD  divalproex (DEPAKOTE ER) 250 MG 24 hr tablet Take 500 mg by mouth at bedtime. Patient not taking: Reported on 09/04/2019    [provider]  donepezil (ARICEPT) 5 MG tablet Take 5 mg by mouth at bedtime. Patient not taking: Reported on 09/04/2019    [provider]  feeding supplement, ENSURE ENLIVE, (ENSURE ENLIVE) LIQD Take 237 mLs by mouth 2 (two) times daily between  meals. 06/13/19   Loletha Grayer, MD  haloperidol (HALDOL) 5 MG tablet Take 2.5 mg by mouth daily. Patient not taking: Reported on 09/04/2019    [provider]  hydrOXYzine (ATARAX/VISTARIL) 50 MG tablet Take 50 mg by mouth every 6 (six) hours as needed for anxiety or itching.    [provider]  LORazepam (ATIVAN) 1 MG tablet Take 1 tablet (1 mg total) by mouth every 6 (six) hours as needed for anxiety. 08/22/18   Gouru, Illene Silver, MD  mometasone-formoterol (DULERA) 100-5 MCG/ACT AERO Inhale 2 puffs into the lungs in the morning and at bedtime. 06/12/19   Loletha Grayer, MD  pantoprazole (PROTONIX) 20 MG tablet Take 1 tablet (20 mg total) by mouth daily. 12/18/19 12/17/20  Lavonia Drafts, MD  predniSONE (DELTASONE) 20 MG tablet 3 tablets daily x 4 days Patient not taking: Reported on 09/04/2019 08/03/19   Paulette Blanch, MD  predniSONE (DELTASONE) 50 MG tablet 1 tab daily starting 10/15/2019 10/15/19   Delman Kitten, MD  QUEtiapine (SEROQUEL) 400 MG tablet Take 800 mg by mouth at bedtime. Patient not taking: Reported on 09/04/2019    [provider]  sildenafil (REVATIO) 20 MG tablet Take 1 tablet (20 mg total) by mouth daily. Take 1-5 tablets as needed 09/04/19   Festus Aloe, MD  sucralfate (CARAFATE) 1 g tablet Take 1 tablet (1 g total) by mouth 4 (four) times daily for 15 days. 12/18/19 01/02/20  Lavonia Drafts, MD  traZODone (DESYREL) 150 MG tablet Take 300 mg by mouth at bedtime.     [provider]  trihexyphenidyl (ARTANE) 5 MG tablet Take 1 tablet (5 mg total) by mouth 3 (three) times daily with meals. Patient not taking: Reported on 09/04/2019 05/22/19   Lorella Nimrod, MD  vitamin B-12 (CYANOCOBALAMIN) 1000 MCG tablet Take 1,000 mcg by mouth daily.    [provider]     Allergies Ace inhibitors  Family History  Problem Relation Age of Onset  . Prostate cancer Neg Hx   . Bladder Cancer Neg Hx     Social History Social History   Tobacco Use  .  Smoking status: Current Every Day Smoker    Packs/day: 1.00    Types: Cigarettes  . Smokeless tobacco: Never Used  Substance Use Topics  . Alcohol use: Yes    Alcohol/week: 0.0 standard drinks    Comment: beer  . Drug use: Not Currently    Types: Marijuana    Review of Systems  Constitutional: No fever/chills Eyes: No visual changes.  ENT: No sore throat. Cardiovascular: Denies chest pain. Respiratory: Denies shortness of breath. Gastrointestinal: As above Genitourinary: Negative for dysuria. Musculoskeletal: Negative for back pain. Skin: Negative for rash. Neurological: Negative for headaches or weakness   ____________________________________________   PHYSICAL EXAM:  VITAL SIGNS: ED Triage Vitals  Enc Vitals Group     BP 12/18/19 0209 125/89     Pulse Rate 12/18/19 0209 97     Resp 12/18/19 0209 16     Temp 12/18/19 0209 97.9  F (36.6 C)     Temp Source 12/18/19 0209 Oral     SpO2 12/18/19 0209 99 %     Weight 12/18/19 0207 58.5 kg (128 lb 15.5 oz)     Height 12/18/19 0207 1.626 m (5\' 4" )     Head Circumference --      Peak Flow --      Pain Score 12/18/19 0207 10     Pain Loc --      Pain Edu? --      Excl. in [place]? --     Constitutional: Alert and oriented. No acute distress.  Nose: No congestion/rhinnorhea. Mouth/Throat: Mucous membranes are moist.   Neck:  Painless ROM Cardiovascular: Normal rate, regular rhythm. Grossly normal heart sounds.  Good peripheral circulation. Respiratory: Normal respiratory effort.  No retractions.  Gastrointestinal: Soft and nontender. No distention.  No CVA tenderness.  Reassuring exam  Musculoskeletal: No lower extremity tenderness nor edema.  Warm and well perfused Neurologic:  Normal speech and language. No gross focal neurologic deficits are appreciated.  Skin:  Skin is warm, dry and intact. No rash noted. Psychiatric: Mood and affect are normal. Speech and behavior are  normal.  ____________________________________________   LABS (all labs ordered are listed, but only abnormal results are displayed)  Labs Reviewed  COMPREHENSIVE METABOLIC PANEL - Abnormal; Notable for the following components:      Result Value   Glucose, Bld 111 (*)    All other components within normal limits  URINALYSIS, COMPLETE (UACMP) WITH MICROSCOPIC - Abnormal; Notable for the following components:   Color, Urine YELLOW (*)    APPearance CLEAR (*)    Bacteria, UA RARE (*)    All other components within normal limits  LIPASE, BLOOD  CBC   ____________________________________________  EKG  None ____________________________________________  RADIOLOGY  Abdominal x-ray reviewed by me, reassuring gas pattern ____________________________________________   PROCEDURES  Procedure(s) performed: No  Procedures   Critical Care performed: No ____________________________________________   INITIAL IMPRESSION / ASSESSMENT AND PLAN / ED COURSE  Pertinent labs & imaging results that were available during my care of the patient were reviewed by me and considered in my medical decision making (see chart for details).  Patient presents with epigastric pain described as burning with occasional radiation into his throat and a bad taste in the back of his mouth, highly suspicious for acid reflux.  Abdominal exam is benign, no distention, soft, nontender.  Lab work is reassuring, normal CMP, normal lipase.  KUB is unremarkable.  We will trial GI cocktail and reevaluate.  Patient with significant provement after GI cocktail. Reevaluate the patient, still no abdominal tenderness to palpation, he is feeling well and seems anxious to go home.  Suspect gastritis, will start him on Carafate, Protonix, outpatient follow-up with PCP, return precautions discussed    ____________________________________________   FINAL CLINICAL IMPRESSION(S) / ED DIAGNOSES  Final diagnoses:   Acute gastritis without hemorrhage, unspecified gastritis type        Note:  This document was prepared using Dragon voice recognition software and may include unintentional dictation errors.   Lavonia Drafts, MD 12/18/19 [phone]

## 2019-12-18 NOTE — ED Notes (Signed)
This RN called Damien at [facility] [place] and requested pt to be picked up due to D/C. Lavell Anchors states it would be around 1200 (noon) until he could pick pt up. Pt made aware and verbalized understanding. NAD noted. D/C and new RX discussed with pt, pt verbalized understanding.

## 2019-12-18 NOTE — ED Notes (Signed)
Pt presents to ED with c/o of ABD pain that is described as a burning and pt states the burning goes up into his chest and throat. Pt states pain started at about 0100 this morning and woke him up from his sleep with one episode of emesis. Pt denies urinary symptoms. NAD noted.  Pt presents hypertensive, pt states a HX of this but has not taken daily medications at this time. Pt is A&Ox4.

## 2019-12-18 NOTE — ED Triage Notes (Addendum)
Pt to ED reporting abd pain that started this morning with one episode of vomiting. Pt also reporting last normal BM was 1 week ago but he was able to pass a small amount of stool today. When asked where the pain is pt pointed to center of upper abd.   Pt from [facility] on [facility].   EMS vitals:  172/106 100 NSR 117 CBG 97% RA

## 2020-01-07 ENCOUNTER — Ambulatory Visit: Payer: Medicare Other | Attending: Internal Medicine

## 2020-01-07 ENCOUNTER — Other Ambulatory Visit: Payer: Self-pay

## 2020-01-07 DIAGNOSIS — Z23 Encounter for immunization: Secondary | ICD-10-CM

## 2020-01-07 NOTE — Progress Notes (Signed)
   Covid-19 Vaccination Clinic  Name:  Carlos Nelson    MRN: [id number] DOB: [birth year]  01/07/2020  Mr. Weintraub was observed post Covid-19 immunization for 15 minutes without incident. He was provided with Vaccine Information Sheet and instruction to access the V-Safe system.   Mr. Reiling was instructed to call 911 with any severe reactions post vaccine: Marland Kitchen Difficulty breathing  . Swelling of face and throat  . A fast heartbeat  . A bad rash all over body  . Dizziness and weakness   Immunizations Administered    Name Date Dose VIS Date Route   Pfizer COVID-19 Vaccine 01/07/2020 11:10 AM 0.3 mL 11/04/2019 Intramuscular   Manufacturer: [employer], [employer]   Lot: [id number]   [place]: [id number]

## 2020-01-18 ENCOUNTER — Encounter: Payer: Self-pay | Admitting: Radiology

## 2020-01-18 ENCOUNTER — Emergency Department: Payer: Medicare Other

## 2020-01-18 ENCOUNTER — Other Ambulatory Visit: Payer: Self-pay

## 2020-01-18 ENCOUNTER — Observation Stay
Admission: EM | Admit: 2020-01-18 | Discharge: 2020-01-19 | Disposition: A | Discharge status: Home / Self Care | Payer: Medicare Other | Attending: Internal Medicine | Admitting: Internal Medicine

## 2020-01-18 DIAGNOSIS — I160 Hypertensive urgency: Secondary | ICD-10-CM

## 2020-01-18 DIAGNOSIS — I10 Essential (primary) hypertension: Secondary | ICD-10-CM | POA: Diagnosis present

## 2020-01-18 DIAGNOSIS — J9600 Acute respiratory failure, unspecified whether with hypoxia or hypercapnia: Secondary | ICD-10-CM | POA: Diagnosis present

## 2020-01-18 DIAGNOSIS — J9601 Acute respiratory failure with hypoxia: Secondary | ICD-10-CM | POA: Diagnosis not present

## 2020-01-18 DIAGNOSIS — Z7982 Long term (current) use of aspirin: Secondary | ICD-10-CM | POA: Diagnosis not present

## 2020-01-18 DIAGNOSIS — E785 Hyperlipidemia, unspecified: Secondary | ICD-10-CM | POA: Diagnosis present

## 2020-01-18 DIAGNOSIS — I2510 Atherosclerotic heart disease of native coronary artery without angina pectoris: Secondary | ICD-10-CM | POA: Insufficient documentation

## 2020-01-18 DIAGNOSIS — R778 Other specified abnormalities of plasma proteins: Secondary | ICD-10-CM | POA: Diagnosis not present

## 2020-01-18 DIAGNOSIS — Z7902 Long term (current) use of antithrombotics/antiplatelets: Secondary | ICD-10-CM | POA: Insufficient documentation

## 2020-01-18 DIAGNOSIS — R7401 Elevation of levels of liver transaminase levels: Secondary | ICD-10-CM | POA: Diagnosis present

## 2020-01-18 DIAGNOSIS — Z720 Tobacco use: Secondary | ICD-10-CM | POA: Diagnosis present

## 2020-01-18 DIAGNOSIS — Z20822 Contact with and (suspected) exposure to covid-19: Secondary | ICD-10-CM | POA: Diagnosis not present

## 2020-01-18 DIAGNOSIS — R0902 Hypoxemia: Secondary | ICD-10-CM

## 2020-01-18 DIAGNOSIS — F209 Schizophrenia, unspecified: Secondary | ICD-10-CM

## 2020-01-18 DIAGNOSIS — F17210 Nicotine dependence, cigarettes, uncomplicated: Secondary | ICD-10-CM | POA: Diagnosis not present

## 2020-01-18 DIAGNOSIS — R0602 Shortness of breath: Secondary | ICD-10-CM | POA: Diagnosis present

## 2020-01-18 DIAGNOSIS — Z79899 Other long term (current) drug therapy: Secondary | ICD-10-CM | POA: Insufficient documentation

## 2020-01-18 DIAGNOSIS — J9621 Acute and chronic respiratory failure with hypoxia: Secondary | ICD-10-CM | POA: Diagnosis present

## 2020-01-18 DIAGNOSIS — J441 Chronic obstructive pulmonary disease with (acute) exacerbation: Secondary | ICD-10-CM | POA: Diagnosis not present

## 2020-01-18 DIAGNOSIS — F418 Other specified anxiety disorders: Secondary | ICD-10-CM

## 2020-01-18 LAB — COMPREHENSIVE METABOLIC PANEL
ALT: 18 U/L (ref 0–44)
AST: 25 U/L (ref 15–41)
Albumin: 4.3 g/dL (ref 3.5–5.0)
Alkaline Phosphatase: 97 U/L (ref 38–126)
Anion gap: 8 (ref 5–15)
BUN: 18 mg/dL (ref 8–23)
CO2: 30 mmol/L (ref 22–32)
Calcium: 9.6 mg/dL (ref 8.9–10.3)
Chloride: 104 mmol/L (ref 98–111)
Creatinine, Ser: 1.12 mg/dL (ref 0.61–1.24)
GFR, Estimated: >60 mL/min (ref >60)
Glucose, Bld: 106 mg/dL — ABNORMAL HIGH (ref 70–99)
Potassium: 4.1 mmol/L (ref 3.5–5.1)
Sodium: 142 mmol/L (ref 135–145)
Total Bilirubin: 0.6 mg/dL (ref 0.3–1.2)
Total Protein: 7.5 g/dL (ref 6.5–8.1)

## 2020-01-18 LAB — BLOOD GAS, ARTERIAL
Acid-Base Excess: 1.8 mmol/L (ref 0.0–2.0)
Bicarbonate: 28.6 mmol/L — ABNORMAL HIGH (ref 20.0–28.0)
FIO2: 0.28
O2 Saturation: 99.2 %
Patient temperature: 37
pCO2 arterial: 53 mmHg — ABNORMAL HIGH (ref 32.0–48.0)
pH, Arterial: 7.34 — ABNORMAL LOW (ref 7.350–7.450)
pO2, Arterial: 152 mmHg — ABNORMAL HIGH (ref 83.0–108.0)

## 2020-01-18 LAB — CBC WITH DIFFERENTIAL/PLATELET
Abs Immature Granulocytes: 0.02 10*3/uL (ref 0.00–0.07)
Basophils Absolute: 0 10*3/uL (ref 0.0–0.1)
Basophils Relative: 0 %
Eosinophils Absolute: 0.2 10*3/uL (ref 0.0–0.5)
Eosinophils Relative: 2 %
HCT: 46.7 % (ref 39.0–52.0)
Hemoglobin: 15.1 g/dL (ref 13.0–17.0)
Immature Granulocytes: 0 %
Lymphocytes Relative: 33 %
Lymphs Abs: 2.3 10*3/uL (ref 0.7–4.0)
MCH: 29.8 pg (ref 26.0–34.0)
MCHC: 32.3 g/dL (ref 30.0–36.0)
MCV: 92.3 fL (ref 80.0–100.0)
Monocytes Absolute: 0.7 10*3/uL (ref 0.1–1.0)
Monocytes Relative: 10 %
Neutro Abs: 3.7 10*3/uL (ref 1.7–7.7)
Neutrophils Relative %: 55 %
Platelets: 217 10*3/uL (ref 150–400)
RBC: 5.06 MIL/uL (ref 4.22–5.81)
RDW: 13.6 % (ref 11.5–15.5)
WBC: 6.9 10*3/uL (ref 4.0–10.5)
nRBC: 0 % (ref 0.0–0.2)

## 2020-01-18 LAB — URINE DRUG SCREEN, QUALITATIVE (ARMC ONLY)
Amphetamines, Ur Screen: NOT DETECTED
Barbiturates, Ur Screen: NOT DETECTED
Benzodiazepine, Ur Scrn: NOT DETECTED
Cannabinoid 50 Ng, Ur ~~LOC~~: NOT DETECTED
Cocaine Metabolite,Ur ~~LOC~~: NOT DETECTED
MDMA (Ecstasy)Ur Screen: NOT DETECTED
Methadone Scn, Ur: NOT DETECTED
Opiate, Ur Screen: NOT DETECTED
Phencyclidine (PCP) Ur S: NOT DETECTED
Tricyclic, Ur Screen: POSITIVE — AB

## 2020-01-18 LAB — TROPONIN I (HIGH SENSITIVITY)
Troponin I (High Sensitivity): 18 ng/L — ABNORMAL HIGH (ref <18)
Troponin I (High Sensitivity): 10 ng/L (ref <18)

## 2020-01-18 LAB — RESP PANEL BY RT-PCR (FLU A&B, COVID) ARPGX2
Influenza A by PCR: NEGATIVE
Influenza B by PCR: NEGATIVE
SARS Coronavirus 2 by RT PCR: NEGATIVE

## 2020-01-18 LAB — BRAIN NATRIURETIC PEPTIDE: B Natriuretic Peptide: 18.5 pg/mL (ref 0.0–100.0)

## 2020-01-18 LAB — MAGNESIUM: Magnesium: 2.1 mg/dL (ref 1.7–2.4)

## 2020-01-18 MED ORDER — DM-GUAIFENESIN ER 30-600 MG PO TB12: 1.0000 | ORAL_TABLET | Freq: Two times a day (BID) | ORAL | Status: DC | PRN

## 2020-01-18 MED ORDER — ONDANSETRON HCL 4 MG/2ML IJ SOLN: 4.0000 mg | Freq: Three times a day (TID) | INTRAMUSCULAR | Status: DC | PRN

## 2020-01-18 MED ORDER — IRBESARTAN 150 MG PO TABS
150.0000 mg | ORAL_TABLET | Freq: Every day | ORAL | Status: DC
  Administered 2020-01-18 – 2020-01-19 (×2): 150 mg via ORAL
  Filled 2020-01-18 (×3): qty 1

## 2020-01-18 MED ORDER — LORAZEPAM 2 MG/ML IJ SOLN: 1.0000 mg | Freq: Two times a day (BID) | INTRAMUSCULAR | Status: DC | PRN

## 2020-01-18 MED ORDER — ALBUTEROL SULFATE (2.5 MG/3ML) 0.083% IN NEBU
2.5000 mg | INHALATION_SOLUTION | RESPIRATORY_TRACT | Status: DC | PRN
  Filled 2020-01-18: qty 3

## 2020-01-18 MED ORDER — LORAZEPAM 2 MG/ML IJ SOLN
1.0000 mg | Freq: Once | INTRAMUSCULAR | Status: AC
  Administered 2020-01-18: 1 mg via INTRAVENOUS
  Filled 2020-01-18: qty 1

## 2020-01-18 MED ORDER — DILTIAZEM HCL 25 MG/5ML IV SOLN
10.0000 mg | Freq: Once | INTRAVENOUS | Status: AC
  Administered 2020-01-18: 10 mg via INTRAVENOUS
  Filled 2020-01-18: qty 5

## 2020-01-18 MED ORDER — ALBUTEROL SULFATE (2.5 MG/3ML) 0.083% IN NEBU
10.0000 mg | INHALATION_SOLUTION | Freq: Once | RESPIRATORY_TRACT | Status: AC
  Administered 2020-01-18: 10 mg via RESPIRATORY_TRACT
  Filled 2020-01-18: qty 12

## 2020-01-18 MED ORDER — ACETAMINOPHEN 325 MG PO TABS
650.0000 mg | ORAL_TABLET | Freq: Four times a day (QID) | ORAL | Status: DC | PRN
  Administered 2020-01-19: 650 mg via ORAL
  Filled 2020-01-18: qty 2

## 2020-01-18 MED ORDER — HYDRALAZINE HCL 20 MG/ML IJ SOLN
5.0000 mg | INTRAMUSCULAR | Status: DC | PRN
  Administered 2020-01-18 (×2): 5 mg via INTRAVENOUS
  Filled 2020-01-18 (×2): qty 1

## 2020-01-18 MED ORDER — NICOTINE 21 MG/24HR TD PT24
21.0000 mg | MEDICATED_PATCH | Freq: Every day | TRANSDERMAL | Status: DC
  Administered 2020-01-18: 21 mg via TRANSDERMAL
  Filled 2020-01-18 (×2): qty 1

## 2020-01-18 MED ORDER — AMLODIPINE BESYLATE 5 MG PO TABS
5.0000 mg | ORAL_TABLET | Freq: Every day | ORAL | Status: DC
Start: 2020-01-18 — End: 2020-01-19
  Administered 2020-01-18 – 2020-01-19 (×2): 5 mg via ORAL
  Filled 2020-01-18 (×2): qty 1

## 2020-01-18 MED ORDER — AMLODIPINE-OLMESARTAN 5-20 MG PO TABS: 1.0000 | ORAL_TABLET | Freq: Every day | ORAL | Status: DC

## 2020-01-18 MED ORDER — MORPHINE SULFATE (PF) 2 MG/ML IV SOLN: 1.0000 mg | INTRAVENOUS | Status: DC | PRN

## 2020-01-18 MED ORDER — DONEPEZIL HCL 5 MG PO TABS
5.0000 mg | ORAL_TABLET | Freq: Every day | ORAL | Status: DC
  Administered 2020-01-18: 5 mg via ORAL
  Filled 2020-01-18: qty 1

## 2020-01-18 MED ORDER — METHYLPREDNISOLONE SODIUM SUCC 40 MG IJ SOLR
40.0000 mg | Freq: Two times a day (BID) | INTRAMUSCULAR | Status: DC
  Administered 2020-01-18 – 2020-01-19 (×2): 40 mg via INTRAVENOUS
  Filled 2020-01-18 (×2): qty 1

## 2020-01-18 MED ORDER — METHYLPREDNISOLONE SODIUM SUCC 125 MG IJ SOLR
125.0000 mg | Freq: Once | INTRAMUSCULAR | Status: AC
  Administered 2020-01-18: 125 mg via INTRAVENOUS
  Filled 2020-01-18: qty 2

## 2020-01-18 MED ORDER — IPRATROPIUM BROMIDE 0.02 % IN SOLN
0.5000 mg | Freq: Once | RESPIRATORY_TRACT | Status: AC
  Administered 2020-01-18: 0.5 mg via RESPIRATORY_TRACT
  Filled 2020-01-18: qty 2.5

## 2020-01-18 MED ORDER — HYDROXYZINE HCL 25 MG PO TABS: 50.0000 mg | ORAL_TABLET | Freq: Four times a day (QID) | ORAL | Status: DC | PRN

## 2020-01-18 MED ORDER — NITROGLYCERIN 0.4 MG SL SUBL: 0.4000 mg | SUBLINGUAL_TABLET | SUBLINGUAL | Status: DC | PRN

## 2020-01-18 MED ORDER — ATORVASTATIN CALCIUM 20 MG PO TABS
40.0000 mg | ORAL_TABLET | Freq: Every day | ORAL | Status: DC
  Administered 2020-01-18 – 2020-01-19 (×2): 40 mg via ORAL
  Filled 2020-01-18 (×2): qty 2

## 2020-01-18 MED ORDER — QUETIAPINE FUMARATE 300 MG PO TABS
600.0000 mg | ORAL_TABLET | Freq: Every day | ORAL | Status: DC
  Administered 2020-01-19: 600 mg via ORAL
  Filled 2020-01-18: qty 2

## 2020-01-18 MED ORDER — DILTIAZEM HCL 25 MG/5ML IV SOLN
20.0000 mg | Freq: Once | INTRAVENOUS | Status: AC
  Administered 2020-01-18: 20 mg via INTRAVENOUS
  Filled 2020-01-18: qty 5

## 2020-01-18 MED ORDER — IPRATROPIUM-ALBUTEROL 0.5-2.5 (3) MG/3ML IN SOLN
3.0000 mL | RESPIRATORY_TRACT | Status: DC
  Administered 2020-01-18 – 2020-01-19 (×7): 3 mL via RESPIRATORY_TRACT
  Filled 2020-01-18 (×9): qty 3

## 2020-01-18 MED ORDER — ASPIRIN EC 81 MG PO TBEC
81.0000 mg | DELAYED_RELEASE_TABLET | Freq: Every day | ORAL | Status: DC
  Administered 2020-01-18 – 2020-01-19 (×2): 81 mg via ORAL
  Filled 2020-01-18 (×2): qty 1

## 2020-01-18 NOTE — ED Notes (Signed)
Pt given sprite 

## 2020-01-18 NOTE — ED Notes (Signed)
Pt given meal tray at this time 

## 2020-01-18 NOTE — ED Notes (Signed)
Pharmacy to send up missing medication.

## 2020-01-18 NOTE — ED Triage Notes (Signed)
Pt with extensive history of copd, psych and angina. Pt is noncompliant with meds and initially refused treatment but eventually came with EMS. Pt arrived dyspneic on 4L [facility]. Pt vs were 202/118 85% ra, 100 bpm.

## 2020-01-18 NOTE — ED Notes (Signed)
Trop drawn and sent

## 2020-01-18 NOTE — ED Notes (Signed)
Pt placed back on BIPAP at this time 

## 2020-01-18 NOTE — H&P (Signed)
History and Physical    Carlos Nelson [id number] DOB: August 03, 1950 DOA: 01/18/2020  Referring MD/NP/PA:   PCP: [facility]   Patient coming from:  The patient is coming from group home.   Chief Complaint: shortness of breath  HPI: Carlos Nelson is a 70 y.o. male with medical history significant of hypertension, hyperlipidemia, COPD, depression with anxiety, schizophrenia, CAD, non-STEMI, tobacco abuse, medication noncompliance, who presents with shortness breath.  Pt is from group home and was noted to have worsening shortness of breath. Noted to be hypoxic, tachycardic and hypertensive in the field with EMS.  Initially refusing treatment, but they convinced him to come to the hospital.  Patient states he has dry cough, shortness breath, no fever or chills.  He has chest tightness, but denies active chest pain.  Denies nausea, vomiting, diarrhea, abdominal pain, symptoms of UTI.  Patient was found to have elevated blood pressure initially 223/188, which improved to 172/97 after giving 20 mg of Cardizem IV in ED.  He has oxygen desaturated to 85% on room air, BiPAP is started in the ED.   ED Course: pt was found to have troponin level 18, WBC 6.7, negative Covid PCR, electrolytes renal function okay, temperature normal, tachycardia with heart rate 110,, tachypnea with heart 36, chest x-ray showed COPD without infiltration.  Patient is placed in progressive bed of observation.  Review of Systems:   General: no fevers, chills, no body weight gain, has fatigue HEENT: no blurry vision, hearing changes or sore throat Respiratory: has dyspnea, coughing, wheezing CV: has chest tightness, no palpitations GI: no nausea, vomiting, abdominal pain, diarrhea, constipation GU: no dysuria, burning on urination, increased urinary frequency, hematuria  Ext: no leg edema Neuro: no unilateral weakness, numbness, or tingling, no vision change or hearing loss Skin: no rash, no  skin tear. MSK: No muscle spasm, no deformity, no limitation of range of movement in spin Heme: No easy bruising.  Travel history: No recent long distant travel.  Allergy:  Allergies  Allergen Reactions  . Ace Inhibitors Swelling    Ok with benazapril, per grouphome    Past Medical History:  Diagnosis Date  . Chronic respiratory failure with hypoxia ([place])   . COPD (chronic obstructive pulmonary disease) ([place])   . Hypertension   . Schizophrenia [facility])     Past Surgical History:  Procedure Laterality Date  . COLONOSCOPY    . COLONOSCOPY WITH PROPOFOL N/A 12/13/2016   Procedure: COLONOSCOPY WITH PROPOFOL;  Surgeon: Lollie Sails, MD;  Location: [facility] ENDOSCOPY;  Service: Endoscopy;  Laterality: N/A;  . ESOPHAGOGASTRODUODENOSCOPY (EGD) WITH PROPOFOL N/A 12/13/2016   Procedure: ESOPHAGOGASTRODUODENOSCOPY (EGD) WITH PROPOFOL;  Surgeon: Lollie Sails, MD;  Location: [facility] ENDOSCOPY;  Service: Endoscopy;  Laterality: N/A;  . left arm surgery      Social History:  reports that he has been smoking cigarettes. He has been smoking about 1.00 pack per day. He has never used smokeless tobacco. He reports current alcohol use. He reports previous drug use. Drug: Marijuana.  Family History:  Family History  Problem Relation Age of Onset  . Prostate cancer Neg Hx   . Bladder Cancer Neg Hx      Prior to Admission medications   Medication Sig Start Date End Date Taking? Authorizing Provider  albuterol (PROVENTIL) (2.5 MG/3ML) 0.083% nebulizer solution Take 3 mLs (2.5 mg total) by nebulization every 6 (six) hours as needed for wheezing or shortness of breath. 06/23/19   Merlyn Lot, MD  albuterol (VENTOLIN HFA) 108 (90 Base) MCG/ACT inhaler Inhale 1-2 puffs into the lungs every 6 (six) hours as needed for wheezing or shortness of breath.    [provider]  ARIPiprazole ER (ABILIFY MAINTENA) 400 MG PRSY prefilled syringe Inject 400 mg into the muscle every 30 (thirty)  days. Patient not taking: Reported on 09/04/2019    [provider]  aspirin EC 81 MG EC tablet Take 1 tablet (81 mg total) by mouth daily. Patient not taking: Reported on 09/04/2019 06/13/19   Alford Highland, MD  atorvastatin (LIPITOR) 40 MG tablet Take 1 tablet (40 mg total) by mouth daily. Patient not taking: Reported on 09/04/2019 06/13/19   Alford Highland, MD  azithromycin (ZITHROMAX Z-PAK) 250 MG tablet 2 tabs by mouth on day 1, then 1 tab daily for next 4 days 10/15/19   Sharyn Creamer, MD  clopidogrel (PLAVIX) 75 MG tablet Take 1 tablet (75 mg total) by mouth daily. Patient not taking: Reported on 09/04/2019 06/13/19   Alford Highland, MD  diltiazem (CARDIZEM CD) 120 MG 24 hr capsule Take 1 capsule (120 mg total) by mouth daily. Patient not taking: Reported on 09/04/2019 06/12/19   Alford Highland, MD  divalproex (DEPAKOTE ER) 250 MG 24 hr tablet Take 500 mg by mouth at bedtime. Patient not taking: Reported on 09/04/2019    [provider]  donepezil (ARICEPT) 5 MG tablet Take 5 mg by mouth at bedtime. Patient not taking: Reported on 09/04/2019    [provider]  feeding supplement, ENSURE ENLIVE, (ENSURE ENLIVE) LIQD Take 237 mLs by mouth 2 (two) times daily between meals. 06/13/19   Alford Highland, MD  haloperidol (HALDOL) 5 MG tablet Take 2.5 mg by mouth daily. Patient not taking: Reported on 09/04/2019    [provider]  hydrOXYzine (ATARAX/VISTARIL) 50 MG tablet Take 50 mg by mouth every 6 (six) hours as needed for anxiety or itching.    [provider]  LORazepam (ATIVAN) 1 MG tablet Take 1 tablet (1 mg total) by mouth every 6 (six) hours as needed for anxiety. 08/22/18   Gouru, Deanna Artis, MD  mometasone-formoterol (DULERA) 100-5 MCG/ACT AERO Inhale 2 puffs into the lungs in the morning and at bedtime. 06/12/19   Alford Highland, MD  pantoprazole (PROTONIX) 20 MG tablet Take 1 tablet (20 mg total) by mouth daily. 12/18/19 12/17/20  Jene Every, MD   predniSONE (DELTASONE) 20 MG tablet 3 tablets daily x 4 days Patient not taking: Reported on 09/04/2019 08/03/19   Irean Hong, MD  predniSONE (DELTASONE) 50 MG tablet 1 tab daily starting 10/15/2019 10/15/19   Sharyn Creamer, MD  QUEtiapine (SEROQUEL) 400 MG tablet Take 800 mg by mouth at bedtime. Patient not taking: Reported on 09/04/2019    [provider]  sildenafil (REVATIO) 20 MG tablet Take 1 tablet (20 mg total) by mouth daily. Take 1-5 tablets as needed 09/04/19   Jerilee Field, MD  sucralfate (CARAFATE) 1 g tablet Take 1 tablet (1 g total) by mouth 4 (four) times daily for 15 days. 12/18/19 01/02/20  Jene Every, MD  traZODone (DESYREL) 150 MG tablet Take 300 mg by mouth at bedtime.     [provider]  trihexyphenidyl (ARTANE) 5 MG tablet Take 1 tablet (5 mg total) by mouth 3 (three) times daily with meals. Patient not taking: Reported on 09/04/2019 05/22/19   Arnetha Courser, MD  vitamin B-12 (CYANOCOBALAMIN) 1000 MCG tablet Take 1,000 mcg by mouth daily.    [provider]  Physical Exam: Vitals:   01/18/20 1100 01/18/20 1115 01/18/20 1130 01/18/20 1145  BP: (!) 170/140 (!) 179/97 (!) 174/100 (!) 166/110  Pulse: (!) 124 (!) 130 (!) 121 (!) 126  Resp:      Temp:      SpO2: 100% 92% 100% 97%  Weight:      Height:       General:  in acute respiratory distress distress HEENT:       Eyes: PERRL, EOMI, no scleral icterus.       ENT: No discharge from the ears and nose, no pharynx injection, no tonsillar enlargement.        Neck: No JVD, no bruit, no mass felt. Heme: No neck lymph node enlargement. Cardiac: S1/S2, RRR, No murmurs, No gallops or rubs. Respiratory: Has decreased air movement bilaterally with wheezing bilaterally GI: Soft, nondistended, nontender, no rebound pain, no organomegaly, BS present. GU: No hematuria Ext: No pitting leg edema bilaterally. 2+DP/PT pulse bilaterally. Musculoskeletal: No joint deformities, No joint redness or  warmth, no limitation of ROM in spin. Skin: No rashes.  Neuro: Alert, oriented X3, cranial nerves II-XII grossly intact, moves all extremities normally. Psych: Patient is not psychotic, no suicidal or hemocidal ideation.  Labs on Admission: I have personally reviewed following labs and imaging studies  CBC: Recent Labs  Lab 01/18/20 0441  WBC 6.9  NEUTROABS 3.7  HGB 15.1  HCT 46.7  MCV 92.3  PLT [id number]   Basic Metabolic Panel: Recent Labs  Lab 01/18/20 0441  NA 142  K 4.1  CL 104  CO2 30  GLUCOSE 106*  BUN 18  CREATININE 1.12  CALCIUM 9.6  MG 2.1   GFR: Estimated Creatinine Clearance: 47.2 mL/min (by C-G formula based on SCr of 1.12 mg/dL). Liver Function Tests: Recent Labs  Lab 01/18/20 0441  AST 25  ALT 18  ALKPHOS 97  BILITOT 0.6  PROT 7.5  ALBUMIN 4.3   No results for input(s): LIPASE, AMYLASE in the last 168 hours. No results for input(s): AMMONIA in the last 168 hours. Coagulation Profile: No results for input(s): INR, PROTIME in the last 168 hours. Cardiac Enzymes: No results for input(s): CKTOTAL, CKMB, CKMBINDEX, TROPONINI in the last 168 hours. BNP (last 3 results) No results for input(s): PROBNP in the last 8760 hours. HbA1C: No results for input(s): HGBA1C in the last 72 hours. CBG: No results for input(s): GLUCAP in the last 168 hours. Lipid Profile: No results for input(s): CHOL, HDL, LDLCALC, TRIG, CHOLHDL, LDLDIRECT in the last 72 hours. Thyroid Function Tests: No results for input(s): TSH, T4TOTAL, FREET4, T3FREE, THYROIDAB in the last 72 hours. Anemia Panel: No results for input(s): VITAMINB12, FOLATE, FERRITIN, TIBC, IRON, RETICCTPCT in the last 72 hours. Urine analysis:    Component Value Date/Time   COLORURINE YELLOW (A) 12/18/2019 0213   APPEARANCEUR CLEAR (A) 12/18/2019 0213   APPEARANCEUR Clear 09/04/2019 1429   LABSPEC 1.017 12/18/2019 0213   LABSPEC 1.014 11/09/2013 1525   PHURINE 6.0 12/18/2019 0213   GLUCOSEU NEGATIVE  12/18/2019 0213   GLUCOSEU Negative 11/09/2013 1525   HGBUR NEGATIVE 12/18/2019 0213   BILIRUBINUR NEGATIVE 12/18/2019 0213   BILIRUBINUR Negative 09/04/2019 1429   BILIRUBINUR Negative 11/09/2013 [place] 12/18/2019 0213   PROTEINUR NEGATIVE 12/18/2019 0213   NITRITE NEGATIVE 12/18/2019 0213   LEUKOCYTESUR NEGATIVE 12/18/2019 0213   LEUKOCYTESUR Negative 11/09/2013 1525   Sepsis Labs: @LABRCNTIP (procalcitonin:4,lacticidven:4) ) Recent Results (from the past 240 hour(s))  Resp Panel by RT-PCR (  Flu A&B, Covid) Nasopharyngeal Swab     Status: None   Collection Time: 01/18/20  4:56 AM   Specimen: Nasopharyngeal Swab; Nasopharyngeal(NP) swabs in vial transport medium  Result Value Ref Range Status   SARS Coronavirus 2 by RT PCR NEGATIVE NEGATIVE Final    Comment: (NOTE) SARS-CoV-2 target nucleic acids are NOT DETECTED.  The SARS-CoV-2 RNA is generally detectable in upper respiratory specimens during the acute phase of infection. The lowest concentration of SARS-CoV-2 viral copies this assay can detect is 138 copies/mL. A negative result does not preclude SARS-Cov-2 infection and should not be used as the sole basis for treatment or other patient management decisions. A negative result may occur with  improper specimen collection/handling, submission of specimen other than nasopharyngeal swab, presence of viral mutation(s) within the areas targeted by this assay, and inadequate number of viral copies(<138 copies/mL). A negative result must be combined with clinical observations, patient history, and epidemiological information. The expected result is Negative.  Fact Sheet for Patients:  EntrepreneurPulse.com.au  Fact Sheet for Healthcare Providers:  IncredibleEmployment.be  This test is no t yet approved or cleared by the [place] FDA and  has been authorized for detection and/or diagnosis of SARS-CoV-2 by FDA under an  Emergency Use Authorization (EUA). This EUA will remain  in effect (meaning this test can be used) for the duration of the COVID-19 declaration under Section 564(b)(1) of the Act, 21 U.S.C.section 360bbb-3(b)(1), unless the authorization is terminated  or revoked sooner.       Influenza A by PCR NEGATIVE NEGATIVE Final   Influenza B by PCR NEGATIVE NEGATIVE Final    Comment: (NOTE) The Xpert Xpress SARS-CoV-2/FLU/RSV plus assay is intended as an aid in the diagnosis of influenza from Nasopharyngeal swab specimens and should not be used as a sole basis for treatment. Nasal washings and aspirates are unacceptable for Xpert Xpress SARS-CoV-2/FLU/RSV testing.  Fact Sheet for Patients: EntrepreneurPulse.com.au  Fact Sheet for Healthcare Providers: IncredibleEmployment.be  This test is not yet approved or cleared by the [place] FDA and has been authorized for detection and/or diagnosis of SARS-CoV-2 by FDA under an Emergency Use Authorization (EUA). This EUA will remain in effect (meaning this test can be used) for the duration of the COVID-19 declaration under Section 564(b)(1) of the Act, 21 U.S.C. section 360bbb-3(b)(1), unless the authorization is terminated or revoked.  Performed at [facility], [address] [place], [place] [address]      Radiological Exams on Admission: DG Chest Portable 1 View  Result Date: 01/18/2020 CLINICAL DATA:  COPD exacerbation, evaluate for infiltrate EXAM: PORTABLE CHEST 1 VIEW COMPARISON:  10/15/2019 chest CT FINDINGS: Hyperinflation and apical lucency. There is no edema, consolidation, effusion, or pneumothorax. Normal heart size and mediastinal contours. Artifact from EKG leads. IMPRESSION: COPD without acute superimposed finding. Electronically Signed   By: Monte Fantasia M.D.   On: 01/18/2020 05:29     EKG: I have personally reviewed.  Poor quality of EKG strips, sinus rhythm, QTC 455,  borderline right axis deviation  Assessment/Plan Principal Problem:   Acute respiratory failure with hypoxia (HCC) Active Problems:   Schizophrenia (HCC)   Hypertension   COPD exacerbation (HCC)   Depression with anxiety   Elevated troponin   HLD (hyperlipidemia)   CAD (coronary artery disease)   Hypertensive urgency   Tobacco abuse   Acute respiratory failure with hypoxia due to COPD exacerbation: ABG with pH 7.34, CO2 53, O2 152 on BiPAP.  Negative Covid PCR,  chest x-ray negative for infiltration.  Patient has decreased air movement bilaterally with wheezing bilaterally, clinically consistent with COPD exacerbation.  Patient has hypertensive urgency, which may have contributed partially.  - will place to progressive unit for observation -Bronchodilators -continue BiPAP -Solu-Medrol 40 mg IV bid -will not give antibiotics since patient does not have productive cough, no fever or chills.   -Mucinex for cough  -Incentive spirometry -Follow up sputum culture -Nasal cannula oxygen as needed to maintain O2 saturation 93% or greater when pt is off BiPAP  Schizophrenia and depression with anxiety: pt is calm now. Pt used to take Depakote, Haldol, Ativan, Seroquel, Artane.  -Currently taking hydroxyzine as needed, Seroquel,  Hypertension and hypertensive urgency: due to medication noncompliance -Azor (amlodipine- olmesartan) -IV hydralazine IV  Elevated troponin and hx of CAD: Troponin minimally elevated at 18, likely due to demand ischemia. -Trend troponin -Aspirin -Check A1c, FLP -Blood pressure control as above  HLD (hyperlipidemia) -Lipitor  Tobacco abuse -Nicotine patch       DVT ppx: SQ Lovenox Code Status: Full code Family Communication: not done, no family member is at bed side. Disposition Plan:  Anticipate discharge back to previous environment Consults called:  none Admission status:   progressive unit for obs      Status is: Observation  The patient  remains OBS appropriate and will d/c before 2 midnights.  Dispo: The patient is from: Group home              Anticipated d/c is to: Group home              Anticipated d/c date is: 1 day              Patient currently is not medically stable to d/c.        Date of Service 01/18/2020    Ivor Costa [facility]   If 7PM-7AM, please contact night-coverage [email or web address] 01/18/2020, 12:05 PM

## 2020-01-18 NOTE — ED Notes (Signed)
Pt titrated to 5L [place] for dyspnea and tachypnea.

## 2020-01-18 NOTE — ED Notes (Signed)
Pt resting in bed.No needs at this time.

## 2020-01-18 NOTE — ED Notes (Signed)
Pt given supper tray at this time.

## 2020-01-18 NOTE — ED Notes (Signed)
Pt placed on bipap. ABG completed by RRT. Xray at bedside.

## 2020-01-18 NOTE — ED Notes (Signed)
Pt given urinal for urine collection 

## 2020-01-18 NOTE — ED Notes (Signed)
Pt placed on 2L [place] for comfort.

## 2020-01-18 NOTE — ED Notes (Signed)
RT at bedside to check BIPAP

## 2020-01-18 NOTE — ED Notes (Signed)
Pt given breakfast tray and eating at this time. Pt on 6L [place] at this time and O2 at 97%.

## 2020-01-18 NOTE — ED Notes (Addendum)
Pt informed meal trays should be around shortly.

## 2020-01-18 NOTE — ED Notes (Signed)
Pt placed on 6L [place] at this time so that he can drink some sprite.

## 2020-01-18 NOTE — ED Notes (Signed)
Medication Reconciliation Report  For Home History Technicians  HIGHLIGHTS:  1. The patient WAS NOT personally interviewed 2. If not, what was the main source used: PHARMACY RECORDS 3. Does the patient appear to take any anti-coagulation agents (e.g. warfarin, Eliquis or Xarelto): NO 4. Does the patient appear to take any anti-convulsant agents (e.g. divalproex, levetiracetam or phenytoin): NO 5. Does the patient appear to use any insulin products (e.g. Lantus, Novolin or Humalog): NO 6. Does the patient appear to take any "beta-blockers" (e.g. metoprolol, carvedilol or bisoprolol: NO  BARRIERS:  1. Were there any barriers that prevented or complicated the medication reconciliation process: YES 2. If yes, what was the primary barrier encountered: Poor historian 3. Does the patient appear compliant with prescribed medications: NO 4. Does the patient express any barriers with compliance: UNABLE TO DETERMINE 5. What is the primary barrier the patient reports: None   NOTES:[Include any concerns, remarks or complaints the patient expresses regarding medication therapy. Any observations or other information that might be useful to the treatment team can also be included. Immediate needs or concerns should be referred to the RN or appropriate member of the treatment team.]  The patient was not interviewed secondary to coming from a group home and poor historian. Both pharmacy staff and group home staff report patient is non-compliant with most of his prescribed medications, although group home staff reported is most likely to take QUETIAPINE. Inhalers seem to have been prescribed in the past but were not dispensed due to cost and record of non-compliance.  Elmo Putt, CPhT [facility] [address] [place], [place] [address] [phone]/2  ** The above is intended solely for informational and/or communicative purposes. It should in no way be  considered an endorsement of any specific treatment, therapy or action. **

## 2020-01-18 NOTE — ED Notes (Signed)
Dietary called and states pt's tray on the way up.

## 2020-01-18 NOTE — ED Notes (Signed)
Pharmacy called and will send up tech to complete med rec soon.

## 2020-01-18 NOTE — ED Notes (Addendum)
Pt still tolerating 6L [place] at this time with O2 at 100%. Pt did not receive lunch tray .Dietary called and will resend another tray.

## 2020-01-18 NOTE — ED Notes (Signed)
Pt continues to take off BIPAP. Pt placed on 6L [place] at 99% at this time. Will continue to monitor.

## 2020-01-18 NOTE — ED Provider Notes (Addendum)
[facility] Emergency Department Provider Note ____________________________________________   Event Date/Time   First MD Initiated Contact with Patient 01/18/20 [phone]     (approximate)  I have reviewed the triage vital signs and the nursing notes.  HISTORY  Chief Complaint Shortness of Breath   HPI Carlos Nelson is a 70 y.o. malewho presents to the ED for evaluation of shortness of breath.  Chart review indicates history of schizophrenia residing at a local group home.  COPD, hypertension and reported noncompliance.  Patient presents to the ED from his group home due to increasing shortness of breath.  Noted to be hypoxic, tachycardic and hypertensive in the field with EMS.  Initially refusing treatment, but they convinced him to come in.  Patient reports a few days of increasing shortness of breath and chest tightness.  He reports a nonproductive cough, without increased sputum production.   History somewhat limited due to acuity conditions and patient's respiratory status.   Past Medical History:  Diagnosis Date  . Chronic respiratory failure with hypoxia ([place])   . COPD (chronic obstructive pulmonary disease) ([place])   . Hypertension   . Schizophrenia [facility])     Patient Active Problem List   Diagnosis Date Noted  . Scrotum pain   . Elevated troponin   . Demand ischemia ([place]) 06/11/2019  . Malnutrition of moderate degree 06/10/2019  . NSTEMI (non-ST elevated myocardial infarction) ([place]) 06/09/2019  . Depression with anxiety 06/09/2019  . Pleuritic chest pain 05/20/2019  . CAP (community acquired pneumonia) 05/20/2019  . Hypertension   . COPD exacerbation ([place])   . Acute respiratory failure with hypoxia ([place])   . Altered mental status 09/25/2018  . Schizophrenia ([place])   . Tachycardia 08/21/2018  . Schizophrenia, chronic with acute exacerbation ([place]) 08/17/2018  . Elevated PSA 05/24/2015    Past Surgical History:  Procedure Laterality  Date  . COLONOSCOPY    . COLONOSCOPY WITH PROPOFOL N/A 12/13/2016   Procedure: COLONOSCOPY WITH PROPOFOL;  Surgeon: Lollie Sails, MD;  Location: [facility] ENDOSCOPY;  Service: Endoscopy;  Laterality: N/A;  . ESOPHAGOGASTRODUODENOSCOPY (EGD) WITH PROPOFOL N/A 12/13/2016   Procedure: ESOPHAGOGASTRODUODENOSCOPY (EGD) WITH PROPOFOL;  Surgeon: Lollie Sails, MD;  Location: [facility] ENDOSCOPY;  Service: Endoscopy;  Laterality: N/A;  . left arm surgery      Prior to Admission medications   Medication Sig Start Date End Date Taking? Authorizing Provider  albuterol (PROVENTIL) (2.5 MG/3ML) 0.083% nebulizer solution Take 3 mLs (2.5 mg total) by nebulization every 6 (six) hours as needed for wheezing or shortness of breath. 06/23/19   Merlyn Lot, MD  albuterol (VENTOLIN HFA) 108 (90 Base) MCG/ACT inhaler Inhale 1-2 puffs into the lungs every 6 (six) hours as needed for wheezing or shortness of breath.    [provider]  ARIPiprazole ER (ABILIFY MAINTENA) 400 MG PRSY prefilled syringe Inject 400 mg into the muscle every 30 (thirty) days. Patient not taking: Reported on 09/04/2019    [provider]  aspirin EC 81 MG EC tablet Take 1 tablet (81 mg total) by mouth daily. Patient not taking: Reported on 09/04/2019 06/13/19   Loletha Grayer, MD  atorvastatin (LIPITOR) 40 MG tablet Take 1 tablet (40 mg total) by mouth daily. Patient not taking: Reported on 09/04/2019 06/13/19   Loletha Grayer, MD  azithromycin (ZITHROMAX Z-PAK) 250 MG tablet 2 tabs by mouth on day 1, then 1 tab daily for next 4 days 10/15/19   Delman Kitten, MD  clopidogrel (PLAVIX) 75 MG  tablet Take 1 tablet (75 mg total) by mouth daily. Patient not taking: Reported on 09/04/2019 06/13/19   Loletha Grayer, MD  diltiazem (CARDIZEM CD) 120 MG 24 hr capsule Take 1 capsule (120 mg total) by mouth daily. Patient not taking: Reported on 09/04/2019 06/12/19   Loletha Grayer, MD  divalproex (DEPAKOTE ER) 250 MG 24 hr tablet  Take 500 mg by mouth at bedtime. Patient not taking: Reported on 09/04/2019    [provider]  donepezil (ARICEPT) 5 MG tablet Take 5 mg by mouth at bedtime. Patient not taking: Reported on 09/04/2019    [provider]  feeding supplement, ENSURE ENLIVE, (ENSURE ENLIVE) LIQD Take 237 mLs by mouth 2 (two) times daily between meals. 06/13/19   Loletha Grayer, MD  haloperidol (HALDOL) 5 MG tablet Take 2.5 mg by mouth daily. Patient not taking: Reported on 09/04/2019    [provider]  hydrOXYzine (ATARAX/VISTARIL) 50 MG tablet Take 50 mg by mouth every 6 (six) hours as needed for anxiety or itching.    [provider]  LORazepam (ATIVAN) 1 MG tablet Take 1 tablet (1 mg total) by mouth every 6 (six) hours as needed for anxiety. 08/22/18   Gouru, Illene Silver, MD  mometasone-formoterol (DULERA) 100-5 MCG/ACT AERO Inhale 2 puffs into the lungs in the morning and at bedtime. 06/12/19   Loletha Grayer, MD  pantoprazole (PROTONIX) 20 MG tablet Take 1 tablet (20 mg total) by mouth daily. 12/18/19 12/17/20  Lavonia Drafts, MD  predniSONE (DELTASONE) 20 MG tablet 3 tablets daily x 4 days Patient not taking: Reported on 09/04/2019 08/03/19   Paulette Blanch, MD  predniSONE (DELTASONE) 50 MG tablet 1 tab daily starting 10/15/2019 10/15/19   Delman Kitten, MD  QUEtiapine (SEROQUEL) 400 MG tablet Take 800 mg by mouth at bedtime. Patient not taking: Reported on 09/04/2019    [provider]  sildenafil (REVATIO) 20 MG tablet Take 1 tablet (20 mg total) by mouth daily. Take 1-5 tablets as needed 09/04/19   Festus Aloe, MD  sucralfate (CARAFATE) 1 g tablet Take 1 tablet (1 g total) by mouth 4 (four) times daily for 15 days. 12/18/19 01/02/20  Lavonia Drafts, MD  traZODone (DESYREL) 150 MG tablet Take 300 mg by mouth at bedtime.     [provider]  trihexyphenidyl (ARTANE) 5 MG tablet Take 1 tablet (5 mg total) by mouth 3 (three) times daily with meals. Patient not taking:  Reported on 09/04/2019 05/22/19   Lorella Nimrod, MD  vitamin B-12 (CYANOCOBALAMIN) 1000 MCG tablet Take 1,000 mcg by mouth daily.    [provider]    Allergies Ace inhibitors  Family History  Problem Relation Age of Onset  . Prostate cancer Neg Hx   . Bladder Cancer Neg Hx     Social History Social History   Tobacco Use  . Smoking status: Current Every Day Smoker    Packs/day: 1.00    Types: Cigarettes  . Smokeless tobacco: Never Used  Substance Use Topics  . Alcohol use: Yes    Alcohol/week: 0.0 standard drinks    Comment: beer  . Drug use: Not Currently    Types: Marijuana    Review of Systems  Unable to be accurately assessed due to pt's respiratory status ____________________________________________   PHYSICAL EXAM:  VITAL SIGNS: Vitals:   01/18/20 0635 01/18/20 0645  BP: 133/89 135/89  Pulse: 94 95  Resp: (!) 23 (!) 21  Temp:    SpO2: 95% 95%  Constitutional: Alert and oriented. Sitting upright in bed, tachypneic and dyspneic. Speaking in couple word sentences.  Eyes: Conjunctivae are normal. PERRL. EOMI. Nelson: Atraumatic. Nose: No congestion/rhinnorhea. Mouth/Throat: Mucous membranes are moist.  Oropharynx non-erythematous. Neck: No stridor. No cervical spine tenderness to palpation. Cardiovascular: Tachycardic rate, regular rhythm. Grossly normal heart sounds.  Good peripheral circulation. Respiratory: Tachypneic to the 30s, subcostal retractions are present.  Diffuse expiratory wheezes with poor air movement throughout. Gastrointestinal: Soft , nondistended, nontender to palpation. No CVA tenderness. Musculoskeletal: No lower extremity tenderness nor edema.  No joint effusions. No signs of acute trauma. Neurologic:  Normal speech and language. No gross focal neurologic deficits are appreciated.  Skin:  Skin is warm, dry and intact. No rash noted. Psychiatric: Mood and affect are normal. Speech and behavior are  normal.  ____________________________________________   LABS (all labs ordered are listed, but only abnormal results are displayed)  Labs Reviewed  BLOOD GAS, ARTERIAL - Abnormal; Notable for the following components:      Result Value   pH, Arterial 7.34 (*)    pCO2 arterial 53 (*)    pO2, Arterial 152 (*)    Bicarbonate 28.6 (*)    All other components within normal limits  COMPREHENSIVE METABOLIC PANEL - Abnormal; Notable for the following components:   Glucose, Bld 106 (*)    All other components within normal limits  TROPONIN I (HIGH SENSITIVITY) - Abnormal; Notable for the following components:   Troponin I (High Sensitivity) 18 (*)    All other components within normal limits  RESP PANEL BY RT-PCR (FLU A&B, COVID) ARPGX2  MAGNESIUM  CBC WITH DIFFERENTIAL/PLATELET  TROPONIN I (HIGH SENSITIVITY)   ____________________________________________  12 Lead EKG  Sinus rhythm, rate of 108 bpm.  Poor quality EKG due to tremor and respiratory status.  Normal axis.  Intervals appear normal.  No evidence of acute ischemia. ____________________________________________  RADIOLOGY  ED MD interpretation: 1 view CXR reviewed by me with hyperexpansion, but no infiltrate  Official radiology report(s): DG Chest Portable 1 View  Result Date: 01/18/2020 CLINICAL DATA:  COPD exacerbation, evaluate for infiltrate EXAM: PORTABLE CHEST 1 VIEW COMPARISON:  10/15/2019 chest CT FINDINGS: Hyperinflation and apical lucency. There is no edema, consolidation, effusion, or pneumothorax. Normal heart size and mediastinal contours. Artifact from EKG leads. IMPRESSION: COPD without acute superimposed finding. Electronically Signed   By: Monte Fantasia M.D.   On: 01/18/2020 05:29    ____________________________________________   PROCEDURES and INTERVENTIONS  Procedure(s) performed (including Critical Care):  .1-3 Lead EKG Interpretation Performed by: Vladimir Crofts, MD Authorized by: Vladimir Crofts,  MD     Interpretation: abnormal     ECG rate:  110   ECG rate assessment: tachycardic     Rhythm: sinus tachycardia     Ectopy: none     Conduction: normal    .Critical Care Performed by: Vladimir Crofts, MD Authorized by: Vladimir Crofts, MD   Critical care provider statement:    Critical care time (minutes):  45   Critical care was necessary to treat or prevent imminent or life-threatening deterioration of the following conditions:  Respiratory failure   Critical care was time spent personally by me on the following activities:  Discussions with consultants, evaluation of patient's response to treatment, examination of patient, ordering and performing treatments and interventions, ordering and review of laboratory studies, ordering and review of radiographic studies, pulse oximetry, re-evaluation of patient's condition, obtaining history from patient or surrogate and review of old charts  Medications  LORazepam (ATIVAN) injection 1 mg (1 mg Intravenous Given 01/18/20 0455)  methylPREDNISolone sodium succinate (SOLU-MEDROL) 125 mg/2 mL injection 125 mg (125 mg Intravenous Given 01/18/20 0454)  albuterol (PROVENTIL) (2.5 MG/3ML) 0.083% nebulizer solution 10 mg (10 mg Nebulization Given 01/18/20 0455)  ipratropium (ATROVENT) nebulizer solution 0.5 mg (0.5 mg Nebulization Given 01/18/20 0455)  diltiazem (CARDIZEM) injection 20 mg (20 mg Intravenous Given 01/18/20 0454)    ____________________________________________   MDM / ED COURSE   70 year old male with history of COPD and noncompliance been to the ED with exacerbation necessitating BiPAP and medical admission.  Hypoxic in the field to the mid 80s, otherwise he medically stable.  Quite hypertensive and tachycardic on arrival necessitating IV diltiazem.  ABG with mild respiratory acidosis.  No evidence of sepsis.  Likely medication noncompliance causing COPD extubation.  Patient clinically improving on BiPAP.  We will provide breathing  treatments, steroids and admit to hospitalist medicine for further work-up and management.   Clinical Course as of 01/18/20 0713  Mon Jan 18, 2020  0529 ABG noted.  Reassessed, patient slowing his respiratory rate and appears more comfortable on BiPAP. [DS]    Clinical Course User Index [DS] Delton Prairie, MD    ____________________________________________   FINAL CLINICAL IMPRESSION(S) / ED DIAGNOSES  Final diagnoses:  COPD exacerbation (HCC)  Hypoxia  Shortness of breath     ED Discharge Orders    None       Haelyn Forgey Katrinka Blazing   Note:  This document was prepared using Dragon voice recognition software and may include unintentional dictation errors.   Delton Prairie, MD 01/18/20 1610    Delton Prairie, MD 01/18/20 [phone]

## 2020-01-19 DIAGNOSIS — J9601 Acute respiratory failure with hypoxia: Secondary | ICD-10-CM | POA: Diagnosis not present

## 2020-01-19 LAB — BASIC METABOLIC PANEL
Anion gap: 9 (ref 5–15)
BUN: 18 mg/dL (ref 8–23)
CO2: 24 mmol/L (ref 22–32)
Calcium: 9.3 mg/dL (ref 8.9–10.3)
Chloride: 104 mmol/L (ref 98–111)
Creatinine, Ser: 0.8 mg/dL (ref 0.61–1.24)
GFR, Estimated: >60 mL/min (ref >60)
Glucose, Bld: 125 mg/dL — ABNORMAL HIGH (ref 70–99)
Potassium: 4.1 mmol/L (ref 3.5–5.1)
Sodium: 137 mmol/L (ref 135–145)

## 2020-01-19 LAB — CBC
HCT: 42.9 % (ref 39.0–52.0)
Hemoglobin: 14.2 g/dL (ref 13.0–17.0)
MCH: 29.8 pg (ref 26.0–34.0)
MCHC: 33.1 g/dL (ref 30.0–36.0)
MCV: 90.1 fL (ref 80.0–100.0)
Platelets: 196 10*3/uL (ref 150–400)
RBC: 4.76 MIL/uL (ref 4.22–5.81)
RDW: 13.6 % (ref 11.5–15.5)
WBC: 11.6 10*3/uL — ABNORMAL HIGH (ref 4.0–10.5)
nRBC: 0 % (ref 0.0–0.2)

## 2020-01-19 LAB — HEMOGLOBIN A1C
Hgb A1c MFr Bld: 5.2 % (ref 4.8–5.6)
Mean Plasma Glucose: 102.54 mg/dL

## 2020-01-19 LAB — LIPID PANEL
Cholesterol: 137 mg/dL (ref 0–200)
HDL: 74 mg/dL (ref >40)
LDL Cholesterol: 48 mg/dL (ref 0–99)
Total CHOL/HDL Ratio: 1.9 RATIO
Triglycerides: 76 mg/dL (ref <150)
VLDL: 15 mg/dL (ref 0–40)

## 2020-01-19 LAB — HIV ANTIBODY (ROUTINE TESTING W REFLEX): HIV Screen 4th Generation wRfx: NONREACTIVE

## 2020-01-19 MED ORDER — PREDNISONE 20 MG PO TABS: 40.0000 mg | ORAL_TABLET | Freq: Every day | ORAL | 0 refills | Status: AC

## 2020-01-19 MED ORDER — VITAMIN B-12 1000 MCG PO TABS
1000.0000 ug | ORAL_TABLET | Freq: Every day | ORAL | Status: DC
  Administered 2020-01-19: 1000 ug via ORAL
  Filled 2020-01-19: qty 1

## 2020-01-19 MED ORDER — ENOXAPARIN SODIUM 40 MG/0.4ML ~~LOC~~ SOLN
40.0000 mg | SUBCUTANEOUS | Status: DC
  Administered 2020-01-19: 40 mg via SUBCUTANEOUS
  Filled 2020-01-19: qty 0.4

## 2020-01-19 MED ORDER — AZITHROMYCIN 500 MG PO TABS: 500.0000 mg | ORAL_TABLET | Freq: Every day | ORAL | 0 refills | Status: AC

## 2020-01-19 MED ORDER — IPRATROPIUM-ALBUTEROL 0.5-2.5 (3) MG/3ML IN SOLN: 3.0000 mL | RESPIRATORY_TRACT | 0 refills | Status: DC

## 2020-01-19 MED ORDER — PANTOPRAZOLE SODIUM 20 MG PO TBEC
20.0000 mg | DELAYED_RELEASE_TABLET | Freq: Every day | ORAL | Status: DC
  Administered 2020-01-19: 20 mg via ORAL
  Filled 2020-01-19: qty 1

## 2020-01-19 NOTE — ED Notes (Signed)
Pharmacy called about pt med.

## 2020-01-19 NOTE — Discharge Instructions (Signed)
Chronic Obstructive Pulmonary Disease Exacerbation  Chronic obstructive pulmonary disease (COPD) is a long-term (chronic) condition that affects the lungs. COPD is a general term that can be used to describe many different lung problems that cause lung swelling (inflammation) and limit airflow, including chronic bronchitis and emphysema. COPD exacerbations are episodes when breathing symptoms become much worse and require extra treatment. COPD exacerbations are usually caused by infections. Without treatment, COPD exacerbations can be severe and even life threatening. Frequent COPD exacerbations can cause further damage to the lungs. What are the causes? This condition may be caused by:  Respiratory infections, including viral and bacterial infections.  Exposure to smoke.  Exposure to air pollution, chemical fumes, or dust.  Things that give you an allergic reaction (allergens).  Not taking your usual COPD medicines as directed.  Underlying medical problems, such as congestive heart failure or infections not involving the lungs. In many cases, the cause (trigger) of this condition is not known. What increases the risk? The following factors may make you more likely to develop this condition:  Smoking cigarettes.  Old age.  Frequent prior COPD exacerbations. What are the signs or symptoms? Symptoms of this condition include:  Increased coughing.  Increased production of mucus from your lungs (sputum).  Increased wheezing.  Increased shortness of breath.  Rapid or labored breathing.  Chest tightness.  Less energy than usual.  Sleep disruption from symptoms.  Confusion or increased sleepiness. Often these symptoms happen or get worse even with the use of medicines. How is this diagnosed? This condition is diagnosed based on:  Your medical history.  A physical exam. You may also have tests, including:  A chest X-ray.  Blood tests.  Lung (pulmonary) function  tests. How is this treated? Treatment for this condition depends on the severity and cause of the symptoms. You may need to be admitted to a hospital for treatment. Some of the treatments commonly used to treat COPD exacerbations are:  Antibiotic medicines. These may be used for severe exacerbations caused by a lung infection, such as pneumonia.  Bronchodilators. These are inhaled medicines that expand the air passages and allow increased airflow.  Steroid medicines. These act to reduce inflammation in the airways. They may be given with an inhaler, taken by mouth, or given through an IV tube inserted into one of your veins.  Supplemental oxygen therapy.  Airway clearing techniques, such as noninvasive ventilation (NIV) and positive expiratory pressure (PEP). These provide respiratory support through a mask or other noninvasive device. An example of this would be using a continuous positive airway pressure (CPAP) machine to improve delivery of oxygen into your lungs. Follow these instructions at home: Medicines  Take over-the-counter and prescription medicines only as told by your health care provider. It is important to use correct technique with inhaled medicines.  If you were prescribed an antibiotic medicine or oral steroid, take it as told by your health care provider. Do not stop taking the medicine even if you start to feel better. Lifestyle  Eat a healthy diet.  Exercise regularly.  Get plenty of sleep.  Avoid exposure to all substances that irritate the airway, especially to tobacco smoke.  Wash your hands often with soap and water to reduce the risk of infection. If soap and water are not available, use hand sanitizer.  During flu season, avoid enclosed spaces that are crowded with people. General instructions  Drink enough fluid to keep your urine clear or pale yellow (unless you have a medical   condition that requires fluid restriction).  Use a cool mist vaporizer. This  humidifies the air and makes it easier for you to clear your chest when you cough.  If you have a home nebulizer and oxygen, continue to use them as told by your health care provider.  Keep all follow-up visits as told by your health care provider. This is important. How is this prevented?  Stay up-to-date on pneumococcal and influenza (flu) vaccines. A flu shot is recommended every year to help prevent exacerbations.  Do not use any products that contain nicotine or tobacco, such as cigarettes and e-cigarettes. Quitting smoking is very important in preventing COPD from getting worse and in preventing exacerbations from happening as often. If you need help quitting, ask your health care provider.  Follow all instructions for pulmonary rehabilitation after a recent exacerbation. This can help prevent future exacerbations.  Work with your health care provider to develop and follow an action plan. This tells you what steps to take when you experience certain symptoms. Contact a health care provider if:  You have a worsening of your regular COPD symptoms. Get help right away if:  You have worsening shortness of breath, even when resting.  You have trouble talking.  You have severe chest pain.  You cough up blood.  You have a fever.  You have weakness, vomit repeatedly, or faint.  You feel confused.  You are not able to sleep because of your symptoms.  You have trouble doing daily activities. Summary  COPD exacerbations are episodes when breathing symptoms become much worse and require extra treatment above your normal treatment.  Exacerbations can be severe and even life threatening. Frequent COPD exacerbations can cause further damage to your lungs.  COPD exacerbations are usually triggered by infections such as the flu, colds, and even pneumonia.  Treatment for this condition depends on the severity and cause of the symptoms. You may need to be admitted to a hospital for  treatment.  Quitting smoking is very important to prevent COPD from getting worse and to prevent exacerbations from happening as often. This information is not intended to replace advice given to you by your health care provider. Make sure you discuss any questions you have with your health care provider. Document Revised: 12/14/2016 Document Reviewed: 02/06/2016 Elsevier Patient Education  2020 [employer]  

## 2020-01-19 NOTE — Discharge Summary (Signed)
Physician Discharge Summary  TASHEEM CONDRON [id number] DOB: 12/02/1950 DOA: 01/18/2020  PCP: [facility]  Admit date: 01/18/2020 Discharge date: 01/19/2020  Admitted From: Home Disposition:  Home  Recommendations for Outpatient Follow-up:  1. Follow up with PCP in 1-2 weeks 2.   Home Health:No Equipment/Devices:None Discharge Condition:Stable CODE STATUS:Full Diet recommendation: Heart Healthy  Brief/Interim Summary: 70 y.o. male with medical history significant of hypertension, hyperlipidemia, COPD, depression with anxiety, schizophrenia, CAD, non-STEMI, tobacco abuse, medication noncompliance, who presents with shortness breath.  Pt is from group home and was noted to have worsening shortness of breath. Noted to be hypoxic, tachycardic and hypertensive in the field with EMS. Initially refusing treatment, but they convinced him to come to the hospital.  Patient states he has dry cough, shortness breath, no fever or chills.  He has chest tightness, but denies active chest pain.  Denies nausea, vomiting, diarrhea, abdominal pain, symptoms of UTI  1/4: Patient's mid hospital observation stay in the emergency room.  Blood pressure improved but still not optimal control.  No desaturation noted after initiation of intravenous steroids and nebulizer therapy.  Patient was noted to be ambulating around the emergency department without shortness of breath or desaturation.  He is not requiring any supplemental oxygen.  He is stable for discharge at this time.  Will provide prescriptions for prednisone 40 mg a day x5 days, DuoNeb every 4 hours as needed, azithromycin 500 mg daily x3days.  Follow-up with PCP in 1 week.  Patient was seen by physical and Occupational Therapy in the building however he has had his baseline ambulatory status so no specific recommendations were indicated.  Discharge Diagnoses:  Principal Problem:   Acute respiratory failure with hypoxia (HCC) Active  Problems:   Schizophrenia (HCC)   Hypertension   COPD exacerbation (HCC)   Depression with anxiety   Elevated troponin   HLD (hyperlipidemia)   CAD (coronary artery disease)   Hypertensive urgency   Tobacco abuse  Acute respiratory failure with hypoxia due to COPD exacerbation:  ABG with pH 7.34, CO2 53, O2 152 on BiPAP.   Negative Covid PCR, chest x-ray negative for infiltration.   Patient has decreased air movementbilaterally with wheezing bilaterally, clinically consistent with COPD exacerbation.  Patient has hypertensive urgency, which may have contributed partially. Patient improved substantially after short-term BiPAP, Solu-Medrol 40 mg IV twice daily and nebulizer therapy.  Will discharge on prednisone 40 mg a day x5 days, DuoNeb every 4 hours x7 days, azithromycin 500 mg daily x3 days.  Follow-up with PCP in 1 week.  Patient is on room air at time of discharge  Schizophrenia and depression with anxiety: Can resume home regimen  Hypertension and hypertensive urgency: due to medication noncompliance Can resume home regimen on discharge  Elevated troponin and hx of CAD: Troponin minimally elevated at 18, likely due to demand ischemia. No chest pain, troponins flat, EKG reassuring  HLD (hyperlipidemia) -Lipitor  Tobacco abuse -Nicotine patch   Discharge Instructions  Discharge Instructions    Diet - low sodium heart healthy   Complete by: As directed    Increase activity slowly   Complete by: As directed      Allergies as of 01/19/2020      Reactions   Ace Inhibitors Swelling   Ok with benazapril, per grouphome      Medication List    TAKE these medications   amLODipine-olmesartan 5-20 MG tablet Commonly known as: AZOR Take 1 tablet by mouth daily.   azithromycin  500 MG tablet Commonly known as: Zithromax Take 1 tablet (500 mg total) by mouth daily for 3 days. Take 1 tablet daily for 3 days.   donepezil 5 MG tablet Commonly known as: ARICEPT Take 5  mg by mouth at bedtime.   hydrOXYzine 50 MG tablet Commonly known as: ATARAX/VISTARIL Take 50 mg by mouth every 6 (six) hours as needed for anxiety or itching.   ipratropium-albuterol 0.5-2.5 (3) MG/3ML Soln Commonly known as: DUONEB Take 3 mLs by nebulization every 4 (four) hours for 14 days.   pantoprazole 20 MG tablet Commonly known as: Protonix Take 1 tablet (20 mg total) by mouth daily.   predniSONE 20 MG tablet Commonly known as: DELTASONE Take 2 tablets (40 mg total) by mouth daily for 5 days.   QUEtiapine 200 MG tablet Commonly known as: SEROQUEL Take 600 mg by mouth at bedtime.   QUEtiapine 100 MG tablet Commonly known as: SEROQUEL Take 100 mg by mouth daily.   vitamin B-12 1000 MCG tablet Commonly known as: CYANOCOBALAMIN Take 1,000 mcg by mouth daily.       Follow-up Information    [facility]. Schedule an appointment as soon as possible for a visit in 1 week(s).   Contact information: [address] [place] [address] [phone]        Yvonne Kendall, MD .   Specialty: Cardiology Contact information: [address] [place] [address] [phone]              Allergies  Allergen Reactions  . Ace Inhibitors Swelling    Ok with benazapril, per grouphome    Consultations:  None   Procedures/Studies: DG Chest Portable 1 View  Result Date: 01/18/2020 CLINICAL DATA:  COPD exacerbation, evaluate for infiltrate EXAM: PORTABLE CHEST 1 VIEW COMPARISON:  10/15/2019 chest CT FINDINGS: Hyperinflation and apical lucency. There is no edema, consolidation, effusion, or pneumothorax. Normal heart size and mediastinal contours. Artifact from EKG leads. IMPRESSION: COPD without acute superimposed finding. Electronically Signed   By: Marnee Spring M.D.   On: 01/18/2020 05:29    (Echo, Carotid, EGD, Colonoscopy, ERCP)    Subjective: Patient seen and examined on the day of discharge.  Ambulating around ER  without shortness of breath or desaturation.  No complaints.  Stable for discharge  Discharge Exam: Vitals:   01/19/20 1400 01/19/20 1542  BP: (!) 152/92 (!) 149/97  Pulse: (!) 113 (!) 112  Resp: (!) 23 (!) 34  Temp:    SpO2: 100% 97%   Vitals:   01/19/20 1215 01/19/20 1300 01/19/20 1400 01/19/20 1542  BP: 125/82 (!) 159/98 (!) 152/92 (!) 149/97  Pulse: (!) 126 (!) 130 (!) 113 (!) 112  Resp: (!) 24 18 (!) 23 (!) 34  Temp:      SpO2: 97% 97% 100% 97%  Weight:      Height:        General: Pt is alert, awake, not in acute distress Cardiovascular: RRR, S1/S2 +, no rubs, no gallops Respiratory: CTA bilaterally, no wheezing, no rhonchi Abdominal: Soft, NT, ND, bowel sounds + Extremities: no edema, no cyanosis    The results of significant diagnostics from this hospitalization (including imaging, microbiology, ancillary and laboratory) are listed below for reference.     Microbiology: Recent Results (from the past 240 hour(s))  Resp Panel by RT-PCR (Flu A&B, Covid) Nasopharyngeal Swab     Status: None   Collection Time: 01/18/20  4:56 AM   Specimen: Nasopharyngeal Swab; Nasopharyngeal(NP)  swabs in vial transport medium  Result Value Ref Range Status   SARS Coronavirus 2 by RT PCR NEGATIVE NEGATIVE Final    Comment: (NOTE) SARS-CoV-2 target nucleic acids are NOT DETECTED.  The SARS-CoV-2 RNA is generally detectable in upper respiratory specimens during the acute phase of infection. The lowest concentration of SARS-CoV-2 viral copies this assay can detect is 138 copies/mL. A negative result does not preclude SARS-Cov-2 infection and should not be used as the sole basis for treatment or other patient management decisions. A negative result may occur with  improper specimen collection/handling, submission of specimen other than nasopharyngeal swab, presence of viral mutation(s) within the areas targeted by this assay, and inadequate number of viral copies(<138 copies/mL). A  negative result must be combined with clinical observations, patient history, and epidemiological information. The expected result is Negative.  Fact Sheet for Patients:  EntrepreneurPulse.com.au  Fact Sheet for Healthcare Providers:  IncredibleEmployment.be  This test is no t yet approved or cleared by the [place] FDA and  has been authorized for detection and/or diagnosis of SARS-CoV-2 by FDA under an Emergency Use Authorization (EUA). This EUA will remain  in effect (meaning this test can be used) for the duration of the COVID-19 declaration under Section 564(b)(1) of the Act, 21 U.S.C.section 360bbb-3(b)(1), unless the authorization is terminated  or revoked sooner.       Influenza A by PCR NEGATIVE NEGATIVE Final   Influenza B by PCR NEGATIVE NEGATIVE Final    Comment: (NOTE) The Xpert Xpress SARS-CoV-2/FLU/RSV plus assay is intended as an aid in the diagnosis of influenza from Nasopharyngeal swab specimens and should not be used as a sole basis for treatment. Nasal washings and aspirates are unacceptable for Xpert Xpress SARS-CoV-2/FLU/RSV testing.  Fact Sheet for Patients: EntrepreneurPulse.com.au  Fact Sheet for Healthcare Providers: IncredibleEmployment.be  This test is not yet approved or cleared by the [place] FDA and has been authorized for detection and/or diagnosis of SARS-CoV-2 by FDA under an Emergency Use Authorization (EUA). This EUA will remain in effect (meaning this test can be used) for the duration of the COVID-19 declaration under Section 564(b)(1) of the Act, 21 U.S.C. section 360bbb-3(b)(1), unless the authorization is terminated or revoked.  Performed at [facility], [address] [place], [place] [address]      Labs: BNP (last 3 results) Recent Labs    05/20/19 0455 08/03/19 0313 01/18/20 0441  BNP 27.0 44.4 [id number]   Basic Metabolic  Panel: Recent Labs  Lab 01/18/20 0441 01/19/20 0507  NA 142 137  K 4.1 4.1  CL 104 104  CO2 30 24  GLUCOSE 106* 125*  BUN 18 18  CREATININE 1.12 0.80  CALCIUM 9.6 9.3  MG 2.1  --    Liver Function Tests: Recent Labs  Lab 01/18/20 0441  AST 25  ALT 18  ALKPHOS 97  BILITOT 0.6  PROT 7.5  ALBUMIN 4.3   No results for input(s): LIPASE, AMYLASE in the last 168 hours. No results for input(s): AMMONIA in the last 168 hours. CBC: Recent Labs  Lab 01/18/20 0441 01/19/20 0507  WBC 6.9 11.6*  NEUTROABS 3.7  --   HGB 15.1 14.2  HCT 46.7 42.9  MCV 92.3 90.1  PLT 217 196   Cardiac Enzymes: No results for input(s): CKTOTAL, CKMB, CKMBINDEX, TROPONINI in the last 168 hours. BNP: Invalid input(s): POCBNP CBG: No results for input(s): GLUCAP in the last 168 hours. D-Dimer No results for input(s): DDIMER in the last  72 hours. Hgb A1c Recent Labs    01/19/20 0507  HGBA1C 5.2   Lipid Profile Recent Labs    01/19/20 0507  CHOL 137  HDL 74  LDLCALC 48  TRIG 76  CHOLHDL 1.9   Thyroid function studies No results for input(s): TSH, T4TOTAL, T3FREE, THYROIDAB in the last 72 hours.  Invalid input(s): FREET3 Anemia work up No results for input(s): VITAMINB12, FOLATE, FERRITIN, TIBC, IRON, RETICCTPCT in the last 72 hours. Urinalysis    Component Value Date/Time   COLORURINE YELLOW (A) 12/18/2019 0213   APPEARANCEUR CLEAR (A) 12/18/2019 0213   APPEARANCEUR Clear 09/04/2019 1429   LABSPEC 1.017 12/18/2019 0213   LABSPEC 1.014 11/09/2013 1525   PHURINE 6.0 12/18/2019 0213   GLUCOSEU NEGATIVE 12/18/2019 0213   GLUCOSEU Negative 11/09/2013 1525   HGBUR NEGATIVE 12/18/2019 0213   BILIRUBINUR NEGATIVE 12/18/2019 0213   BILIRUBINUR Negative 09/04/2019 1429   BILIRUBINUR Negative 11/09/2013 [place] 12/18/2019 0213   PROTEINUR NEGATIVE 12/18/2019 0213   NITRITE NEGATIVE 12/18/2019 0213   LEUKOCYTESUR NEGATIVE 12/18/2019 0213   LEUKOCYTESUR Negative  11/09/2013 1525   Sepsis Labs Invalid input(s): PROCALCITONIN,  WBC,  LACTICIDVEN Microbiology Recent Results (from the past 240 hour(s))  Resp Panel by RT-PCR (Flu A&B, Covid) Nasopharyngeal Swab     Status: None   Collection Time: 01/18/20  4:56 AM   Specimen: Nasopharyngeal Swab; Nasopharyngeal(NP) swabs in vial transport medium  Result Value Ref Range Status   SARS Coronavirus 2 by RT PCR NEGATIVE NEGATIVE Final    Comment: (NOTE) SARS-CoV-2 target nucleic acids are NOT DETECTED.  The SARS-CoV-2 RNA is generally detectable in upper respiratory specimens during the acute phase of infection. The lowest concentration of SARS-CoV-2 viral copies this assay can detect is 138 copies/mL. A negative result does not preclude SARS-Cov-2 infection and should not be used as the sole basis for treatment or other patient management decisions. A negative result may occur with  improper specimen collection/handling, submission of specimen other than nasopharyngeal swab, presence of viral mutation(s) within the areas targeted by this assay, and inadequate number of viral copies(<138 copies/mL). A negative result must be combined with clinical observations, patient history, and epidemiological information. The expected result is Negative.  Fact Sheet for Patients:  EntrepreneurPulse.com.au  Fact Sheet for Healthcare Providers:  IncredibleEmployment.be  This test is no t yet approved or cleared by the [place] FDA and  has been authorized for detection and/or diagnosis of SARS-CoV-2 by FDA under an Emergency Use Authorization (EUA). This EUA will remain  in effect (meaning this test can be used) for the duration of the COVID-19 declaration under Section 564(b)(1) of the Act, 21 U.S.C.section 360bbb-3(b)(1), unless the authorization is terminated  or revoked sooner.       Influenza A by PCR NEGATIVE NEGATIVE Final   Influenza B by PCR NEGATIVE  NEGATIVE Final    Comment: (NOTE) The Xpert Xpress SARS-CoV-2/FLU/RSV plus assay is intended as an aid in the diagnosis of influenza from Nasopharyngeal swab specimens and should not be used as a sole basis for treatment. Nasal washings and aspirates are unacceptable for Xpert Xpress SARS-CoV-2/FLU/RSV testing.  Fact Sheet for Patients: EntrepreneurPulse.com.au  Fact Sheet for Healthcare Providers: IncredibleEmployment.be  This test is not yet approved or cleared by the [place] FDA and has been authorized for detection and/or diagnosis of SARS-CoV-2 by FDA under an Emergency Use Authorization (EUA). This EUA will remain in effect (meaning this test can be used) for the  duration of the COVID-19 declaration under Section 564(b)(1) of the Act, 21 U.S.C. section 360bbb-3(b)(1), unless the authorization is terminated or revoked.  Performed at [facility], [address], [place], [place] [address]      Time coordinating discharge: Over 30 minutes  SIGNED:   Sidney Ace, MD  [facility] 01/19/2020, 3:51 PM Pager   If 7PM-7AM, please contact night-coverage

## 2020-01-19 NOTE — ED Notes (Signed)
Admitting at bedside to discuss possible discharge, patient provided with snack and drink

## 2020-01-19 NOTE — ED Notes (Addendum)
This RN at bedside. Pt provided urinal at bedside. Pt with [place] off and stating he doesn't need it. Pt RA sats 97-100%. Pt also c/o low back pain. See MAR for medication administration.

## 2020-01-19 NOTE — ED Notes (Signed)
Per Byrd Hesselbach at group home no one able to come pick up patient today, Taxi called for patient.

## 2020-01-19 NOTE — ED Notes (Signed)
Pt witnessed talking and motioning to nothing in the room. When asked if the patient was seeing or hearing anything, pt denies any hallucinations. Pt continued to interact with nothing in the room for some time.

## 2020-01-19 NOTE — ED Notes (Signed)
PT ambulated patient, patient tolerated well, patient reports mild shortness of breath, oxygen 94% on RA and HR 122

## 2020-01-19 NOTE — ED Notes (Signed)
Patient asked RN if he could walk around for little while, patient gait steady, ambulating in hallway without difficulty.

## 2020-01-19 NOTE — ED Notes (Signed)
Pt given meal tray.

## 2020-01-19 NOTE — Evaluation (Signed)
Physical Therapy Evaluation Patient Details Name: Carlos Nelson MRN: [id number] DOB: Nov 03, 1950 Today's Date: 01/19/2020   History of Present Illness  Pt admitted for ARF with hypoxia. History includes HTN, HLD, COPD, depression, schizophrenia, and CAD. Pt with complaints of SOB symptoms at admission.  Clinical Impression  Pt is a pleasant 70 year old male who was admitted for ARF with hypoxia. Pt demonstrates all bed mobility/transfers/ambulation at baseline level. All mobility performed on RA. Pt does not require any further PT needs at this time. Pt will be dc in house and does not require follow up. RN aware. Will dc current orders.     Follow Up Recommendations No PT follow up    Equipment Recommendations  None recommended by PT    Recommendations for Other Services       Precautions / Restrictions Precautions Precautions: Fall Restrictions Weight Bearing Restrictions: No      Mobility  Bed Mobility Overal bed mobility: Independent             General bed mobility comments: safe technique with upright posture    Transfers Overall transfer level: Independent Equipment used: None             General transfer comment: safe technique  Ambulation/Gait Ambulation/Gait assistance: Independent Gait Distance (Feet): 100 Feet Assistive device: None Gait Pattern/deviations: Step-through pattern     General Gait Details: ambulated in hallway with reciprocal gait pattern. Reports slight SOB symptoms with exertion. All mobility performed on RA with sats at 94%  Stairs            Wheelchair Mobility    Modified Rankin (Stroke Patients Only)       Balance Overall balance assessment: Independent                                           Pertinent Vitals/Pain Pain Assessment: No/denies pain    Home Living Family/patient expects to be discharged to:: Group home                 Additional Comments: Reports group home is  single-story with 3 steps, R rail to enter    Prior Function Level of Independence: Independent         Comments: reports no falls recently     Hand Dominance        Extremity/Trunk Assessment   Upper Extremity Assessment Upper Extremity Assessment: Overall WFL for tasks assessed    Lower Extremity Assessment Lower Extremity Assessment: Overall WFL for tasks assessed       Communication   Communication: No difficulties  Cognition Arousal/Alertness: Awake/alert Behavior During Therapy: WFL for tasks assessed/performed Overall Cognitive Status: Within Functional Limits for tasks assessed                                        General Comments      Exercises     Assessment/Plan    PT Assessment Patent does not need any further PT services  PT Problem List         PT Treatment Interventions      PT Goals (Current goals can be found in the Care Plan section)  Acute Rehab PT Goals Patient Stated Goal: to go home PT Goal Formulation: All assessment and education complete, DC therapy  Time For Goal Achievement: 01/19/20 Potential to Achieve Goals: Good    Frequency     Barriers to discharge        Co-evaluation               AM-PAC PT "6 Clicks" Mobility  Outcome Measure Help needed turning from your back to your side while in a flat bed without using bedrails?: None Help needed moving from lying on your back to sitting on the side of a flat bed without using bedrails?: None Help needed moving to and from a bed to a chair (including a wheelchair)?: None Help needed standing up from a chair using your arms (e.g., wheelchair or bedside chair)?: None Help needed to walk in hospital room?: None Help needed climbing 3-5 steps with a railing? : None 6 Click Score: 24    End of Session   Activity Tolerance: Patient tolerated treatment well Patient left: in bed;with nursing/sitter in room Nurse Communication: Mobility status PT Visit  Diagnosis: Difficulty in walking, not elsewhere classified (R26.2)    Time: 2130-8657 PT Time Calculation (min) (ACUTE ONLY): 8 min   Charges:   PT Evaluation $PT Eval Low Complexity: 1 Low          Carlos Nelson, PT, DPT [phone]   Carlos Nelson 01/19/2020, 1:15 PM

## 2020-01-19 NOTE — Progress Notes (Signed)
OT Cancellation Note  Patient Details Name: Carlos Nelson MRN: [id number] DOB: 1950-06-27   Cancelled Treatment:    Reason Eval/Treat Not Completed: OT screened, no needs identified, will sign off. Order received and chart reviewed. Spoke with pt care team, and pt noted to be back to baseline level of functional independence. Currently on RA with no skilled OT needs at this time. Will sign off. Please re-consult if additional OT needs arise during this admission.   Rockney Ghee, M.S., OTR/L Ascom: [phone] 01/19/20, 12:10 PM

## 2020-01-19 NOTE — Progress Notes (Signed)
Patient remains on nasal cannula at this time. Weaned off bipap earlier 1/3. Order noted per admitting md for bipap. Patient has refused. Patient is in no distress. Will continue to monitor

## 2020-01-19 NOTE — ED Notes (Signed)
Pt requesting meds for sleep.

## 2020-02-03 ENCOUNTER — Other Ambulatory Visit: Payer: Self-pay

## 2020-02-03 ENCOUNTER — Emergency Department: Payer: Medicare Other

## 2020-02-03 ENCOUNTER — Emergency Department
Admission: EM | Admit: 2020-02-03 | Discharge: 2020-02-03 | Disposition: A | Discharge status: Home / Self Care | Payer: Medicare Other | Attending: Emergency Medicine | Admitting: Emergency Medicine

## 2020-02-03 DIAGNOSIS — R0602 Shortness of breath: Secondary | ICD-10-CM | POA: Insufficient documentation

## 2020-02-03 DIAGNOSIS — R079 Chest pain, unspecified: Secondary | ICD-10-CM | POA: Insufficient documentation

## 2020-02-03 DIAGNOSIS — Z79899 Other long term (current) drug therapy: Secondary | ICD-10-CM | POA: Insufficient documentation

## 2020-02-03 DIAGNOSIS — I119 Hypertensive heart disease without heart failure: Secondary | ICD-10-CM | POA: Diagnosis not present

## 2020-02-03 DIAGNOSIS — I2510 Atherosclerotic heart disease of native coronary artery without angina pectoris: Secondary | ICD-10-CM | POA: Diagnosis not present

## 2020-02-03 DIAGNOSIS — F17210 Nicotine dependence, cigarettes, uncomplicated: Secondary | ICD-10-CM | POA: Insufficient documentation

## 2020-02-03 DIAGNOSIS — Z20822 Contact with and (suspected) exposure to covid-19: Secondary | ICD-10-CM | POA: Insufficient documentation

## 2020-02-03 DIAGNOSIS — J441 Chronic obstructive pulmonary disease with (acute) exacerbation: Secondary | ICD-10-CM | POA: Diagnosis not present

## 2020-02-03 DIAGNOSIS — R0600 Dyspnea, unspecified: Secondary | ICD-10-CM

## 2020-02-03 LAB — COMPREHENSIVE METABOLIC PANEL
ALT: 30 U/L (ref 0–44)
AST: 31 U/L (ref 15–41)
Albumin: 3.9 g/dL (ref 3.5–5.0)
Alkaline Phosphatase: 80 U/L (ref 38–126)
Anion gap: 9 (ref 5–15)
BUN: 11 mg/dL (ref 8–23)
CO2: 26 mmol/L (ref 22–32)
Calcium: 9.4 mg/dL (ref 8.9–10.3)
Chloride: 101 mmol/L (ref 98–111)
Creatinine, Ser: 0.91 mg/dL (ref 0.61–1.24)
GFR, Estimated: >60 mL/min (ref >60)
Glucose, Bld: 95 mg/dL (ref 70–99)
Potassium: 4.2 mmol/L (ref 3.5–5.1)
Sodium: 136 mmol/L (ref 135–145)
Total Bilirubin: 0.9 mg/dL (ref 0.3–1.2)
Total Protein: 6.8 g/dL (ref 6.5–8.1)

## 2020-02-03 LAB — TROPONIN I (HIGH SENSITIVITY)
Troponin I (High Sensitivity): 7 ng/L (ref <18)
Troponin I (High Sensitivity): 7 ng/L (ref <18)

## 2020-02-03 LAB — CBC
HCT: 44.3 % (ref 39.0–52.0)
Hemoglobin: 14.4 g/dL (ref 13.0–17.0)
MCH: 29.6 pg (ref 26.0–34.0)
MCHC: 32.5 g/dL (ref 30.0–36.0)
MCV: 91.2 fL (ref 80.0–100.0)
Platelets: 229 10*3/uL (ref 150–400)
RBC: 4.86 MIL/uL (ref 4.22–5.81)
RDW: 13 % (ref 11.5–15.5)
WBC: 9.1 10*3/uL (ref 4.0–10.5)
nRBC: 0 % (ref 0.0–0.2)

## 2020-02-03 LAB — SARS CORONAVIRUS 2 (TAT 6-24 HRS): SARS Coronavirus 2: NEGATIVE

## 2020-02-03 MED ORDER — QUETIAPINE FUMARATE 200 MG PO TABS: 600.0000 mg | ORAL_TABLET | Freq: Every day | ORAL | 0 refills | Status: AC

## 2020-02-03 MED ORDER — ALBUTEROL SULFATE HFA 108 (90 BASE) MCG/ACT IN AERS
2.0000 | INHALATION_SPRAY | Freq: Once | RESPIRATORY_TRACT | Status: AC
  Administered 2020-02-03: 2 via RESPIRATORY_TRACT
  Filled 2020-02-03: qty 6.7

## 2020-02-03 NOTE — Discharge Instructions (Addendum)
Please use your nebulizer treatment at home as needed.  Please take your Seroquel as prescribed at night.  Return to the emergency department for any chest pain or return of/worsening shortness of breath.

## 2020-02-03 NOTE — ED Triage Notes (Addendum)
Pt in with co chest pain for few hours, hx of heart disease. Pt brought in by [facility] from [facility].

## 2020-02-03 NOTE — ED Notes (Signed)
Pt presents to ED with c/o of mid sternum chest pain that started yesterday. Pt also states "some SOB". Pt is speaking in full sentences without issue. Pt is hard to understand verbally. Pt then states "I havent slept in 2 days". Pt states he does currently smoke 4-5 cigarettes a day.

## 2020-02-03 NOTE — ED Provider Notes (Signed)
[facility] Emergency Department Provider Note  Time seen: 8:52 AM  I have reviewed the triage vital signs and the nursing notes.   HISTORY  Chief Complaint Chest Pain   HPI Carlos Nelson is a 70 y.o. male with a past medical history of COPD, hypertension, schizophrenia, CAD, presents to the emergency department for shortness of breath and chest tightness.  According to the patient since last night he has been experiencing chest tightness and shortness of breath.  Patient denies any "pain."  Patient states he has a nebulizer machine at home but he has not been using it recently.  Patient also states for the past 2 days he has been out of his Seroquel and he has not been sleeping which she believes is causing his shortness of breath.  Patient denies any cough or fever.  Denies any nausea vomiting or diarrhea.  Overall patient appears extremely well.  Denies any current chest pain.   Past Medical History:  Diagnosis Date  . Chronic respiratory failure with hypoxia ([place])   . COPD (chronic obstructive pulmonary disease) ([place])   . Hypertension   . Schizophrenia [facility])     Patient Active Problem List   Diagnosis Date Noted  . HLD (hyperlipidemia) 01/18/2020  . CAD (coronary artery disease) 01/18/2020  . Hypertensive urgency 01/18/2020  . Tobacco abuse 01/18/2020  . Scrotum pain   . Elevated troponin   . Demand ischemia ([place]) 06/11/2019  . Malnutrition of moderate degree 06/10/2019  . NSTEMI (non-ST elevated myocardial infarction) ([place]) 06/09/2019  . Depression with anxiety 06/09/2019  . Pleuritic chest pain 05/20/2019  . CAP (community acquired pneumonia) 05/20/2019  . Hypertension   . COPD exacerbation ([place])   . Acute respiratory failure with hypoxia ([place])   . Altered mental status 09/25/2018  . Schizophrenia ([place])   . Tachycardia 08/21/2018  . Schizophrenia, chronic with acute exacerbation ([place]) 08/17/2018  . Elevated PSA 05/24/2015    Past  Surgical History:  Procedure Laterality Date  . COLONOSCOPY    . COLONOSCOPY WITH PROPOFOL N/A 12/13/2016   Procedure: COLONOSCOPY WITH PROPOFOL;  Surgeon: Lollie Sails, MD;  Location: [facility] ENDOSCOPY;  Service: Endoscopy;  Laterality: N/A;  . ESOPHAGOGASTRODUODENOSCOPY (EGD) WITH PROPOFOL N/A 12/13/2016   Procedure: ESOPHAGOGASTRODUODENOSCOPY (EGD) WITH PROPOFOL;  Surgeon: Lollie Sails, MD;  Location: [facility] ENDOSCOPY;  Service: Endoscopy;  Laterality: N/A;  . left arm surgery      Prior to Admission medications   Medication Sig Start Date End Date Taking? Authorizing Provider  amLODipine-olmesartan (AZOR) 5-20 MG tablet Take 1 tablet by mouth daily.    [provider]  donepezil (ARICEPT) 5 MG tablet Take 5 mg by mouth at bedtime.    [provider]  hydrOXYzine (ATARAX/VISTARIL) 50 MG tablet Take 50 mg by mouth every 6 (six) hours as needed for anxiety or itching.    [provider]  ipratropium-albuterol (DUONEB) 0.5-2.5 (3) MG/3ML SOLN Take 3 mLs by nebulization every 4 (four) hours for 14 days. 01/19/20 02/02/20  Sidney Ace, MD  pantoprazole (PROTONIX) 20 MG tablet Take 1 tablet (20 mg total) by mouth daily. 12/18/19 12/17/20  Lavonia Drafts, MD  QUEtiapine (SEROQUEL) 100 MG tablet Take 100 mg by mouth daily.    [provider]  QUEtiapine (SEROQUEL) 200 MG tablet Take 600 mg by mouth at bedtime.    [provider]  vitamin B-12 (CYANOCOBALAMIN) 1000 MCG tablet Take 1,000 mcg by mouth daily.    [provider]  Allergies  Allergen Reactions  . Ace Inhibitors Swelling    Ok with benazapril, per grouphome    Family History  Problem Relation Age of Onset  . Prostate cancer Neg Hx   . Bladder Cancer Neg Hx     Social History Social History   Tobacco Use  . Smoking status: Current Every Day Smoker    Packs/day: 1.00    Types: Cigarettes  . Smokeless tobacco: Never Used  Substance Use Topics  . Alcohol  use: Yes    Alcohol/week: 0.0 standard drinks    Comment: beer  . Drug use: Not Currently    Types: Marijuana    Review of Systems Constitutional: Negative for fever. Cardiovascular: Chest tightness, largely resolved Respiratory: Mild shortness of breath.  Denies any cough. Gastrointestinal: Negative for abdominal pain, vomiting and diarrhea. Musculoskeletal: Negative for musculoskeletal complaints Neurological: Negative for headache All other ROS negative  ____________________________________________   PHYSICAL EXAM:  VITAL SIGNS: ED Triage Vitals  Enc Vitals Group     BP 02/03/20 0103 (!) 160/106     Pulse Rate 02/03/20 0103 (!) 105     Resp 02/03/20 0103 18     Temp 02/03/20 0103 98.6 F (37 C)     Temp Source 02/03/20 0103 Oral     SpO2 02/03/20 0103 98 %     Weight 02/03/20 0104 120 lb (54.4 kg)     Height --      Head Circumference --      Peak Flow --      Pain Score 02/03/20 0104 8     Pain Loc --      Pain Edu? --      Excl. in [place]? --    Constitutional: Alert and oriented. Well appearing and in no distress. Eyes: Normal exam ENT      Head: Normocephalic and atraumatic.      Mouth/Throat: Mucous membranes are moist. Cardiovascular: Normal rate, regular rhythm.  Respiratory: Normal respiratory effort without tachypnea nor retractions. Breath sounds are clear, without clear wheezes rales or rhonchi Gastrointestinal: Soft and nontender. No distention.  Musculoskeletal: Nontender with normal range of motion in all extremities. No lower extremity tenderness or edema. Neurologic:  Normal speech and language. No gross focal neurologic deficits  Skin:  Skin is warm, dry and intact.  Psychiatric: Mood and affect are normal.   ____________________________________________    EKG  EKG viewed and interpreted by myself shows sinus tachycardia 105 bpm with a narrow QRS, normal axis, normal intervals, nonspecific ST changes without ST  elevation.  ____________________________________________    RADIOLOGY  Chest x-ray is negative  ____________________________________________   INITIAL IMPRESSION / ASSESSMENT AND PLAN / ED COURSE  Pertinent labs & imaging results that were available during my care of the patient were reviewed by me and considered in my medical decision making (see chart for details).   Patient presents to the emergency department for chest tightness and shortness of breath since last night.  States chest tightness is gone still feels mild shortness of breath but has COPD at baseline per patient.  Has a nebulizer machine at home.  Patient's lab work is overall reassuring including negative troponin x2.  No concerning EKG findings and negative chest x-ray.  Given the patient's complaint of shortness of breath I do believe we should test the patient for COVID although no fever or cough.  We will also dose albuterol as the patient has not been using his nebulizer recently per patient.  Given  the patient has not had Seroquel for the past 2 nights as he ran out of his medication I will refill his Seroquel medication as well.  Patient agreeable to plan of care.  Carlos Nelson was evaluated in Emergency Department on 02/03/2020 for the symptoms described in the history of present illness. He was evaluated in the context of the global COVID-19 pandemic, which necessitated consideration that the patient might be at risk for infection with the SARS-CoV-2 virus that causes COVID-19. Institutional protocols and algorithms that pertain to the evaluation of patients at risk for COVID-19 are in a state of rapid change based on information released by regulatory bodies including the CDC and federal and state organizations. These policies and algorithms were followed during the patient's care in the ED.  ____________________________________________   FINAL CLINICAL IMPRESSION(S) / ED DIAGNOSES  Shortness of breath Chest  pain   Harvest Dark, MD 02/03/20 [phone]

## 2020-02-03 NOTE — ED Notes (Signed)
D/C and new RX discussed with pt, pt verbalized understanding. NAD noted. Pt requests this RN called the [facility] and that's who will transport pt back to facility.

## 2020-02-03 NOTE — ED Notes (Signed)
This RN called [facility] and spoke to [place] and advised her of D/C and that pt stated they will arrange transport back to facility. Tomi Bamberger advised pt will be waiting in the lobby when hey are available to come get him, Tomi Bamberger verbalzied understanding.

## 2020-02-20 ENCOUNTER — Emergency Department: Payer: Medicare Other

## 2020-02-20 ENCOUNTER — Other Ambulatory Visit: Payer: Self-pay

## 2020-02-20 ENCOUNTER — Emergency Department
Admission: EM | Admit: 2020-02-20 | Discharge: 2020-02-20 | Disposition: A | Discharge status: Home / Self Care | Payer: Medicare Other | Attending: Emergency Medicine | Admitting: Emergency Medicine

## 2020-02-20 ENCOUNTER — Encounter: Payer: Self-pay | Admitting: Emergency Medicine

## 2020-02-20 DIAGNOSIS — J441 Chronic obstructive pulmonary disease with (acute) exacerbation: Secondary | ICD-10-CM | POA: Insufficient documentation

## 2020-02-20 DIAGNOSIS — Z79899 Other long term (current) drug therapy: Secondary | ICD-10-CM | POA: Insufficient documentation

## 2020-02-20 DIAGNOSIS — I2510 Atherosclerotic heart disease of native coronary artery without angina pectoris: Secondary | ICD-10-CM | POA: Insufficient documentation

## 2020-02-20 DIAGNOSIS — I10 Essential (primary) hypertension: Secondary | ICD-10-CM | POA: Diagnosis not present

## 2020-02-20 DIAGNOSIS — F17210 Nicotine dependence, cigarettes, uncomplicated: Secondary | ICD-10-CM | POA: Diagnosis not present

## 2020-02-20 DIAGNOSIS — R0602 Shortness of breath: Secondary | ICD-10-CM | POA: Diagnosis present

## 2020-02-20 LAB — TROPONIN I (HIGH SENSITIVITY)
Troponin I (High Sensitivity): 9 ng/L (ref <18)
Troponin I (High Sensitivity): 9 ng/L (ref <18)

## 2020-02-20 LAB — BASIC METABOLIC PANEL
Anion gap: 9 (ref 5–15)
BUN: 11 mg/dL (ref 8–23)
CO2: 27 mmol/L (ref 22–32)
Calcium: 9.7 mg/dL (ref 8.9–10.3)
Chloride: 106 mmol/L (ref 98–111)
Creatinine, Ser: 0.87 mg/dL (ref 0.61–1.24)
GFR, Estimated: >60 mL/min (ref >60)
Glucose, Bld: 98 mg/dL (ref 70–99)
Potassium: 4.2 mmol/L (ref 3.5–5.1)
Sodium: 142 mmol/L (ref 135–145)

## 2020-02-20 LAB — CBC
HCT: 46.3 % (ref 39.0–52.0)
Hemoglobin: 15.3 g/dL (ref 13.0–17.0)
MCH: 30.4 pg (ref 26.0–34.0)
MCHC: 33 g/dL (ref 30.0–36.0)
MCV: 91.9 fL (ref 80.0–100.0)
Platelets: 194 10*3/uL (ref 150–400)
RBC: 5.04 MIL/uL (ref 4.22–5.81)
RDW: 13.3 % (ref 11.5–15.5)
WBC: 5.5 10*3/uL (ref 4.0–10.5)
nRBC: 0 % (ref 0.0–0.2)

## 2020-02-20 MED ORDER — IPRATROPIUM-ALBUTEROL 0.5-2.5 (3) MG/3ML IN SOLN
3.0000 mL | Freq: Once | RESPIRATORY_TRACT | Status: AC
  Administered 2020-02-20: 3 mL via RESPIRATORY_TRACT
  Filled 2020-02-20: qty 3

## 2020-02-20 MED ORDER — DEXAMETHASONE SODIUM PHOSPHATE 10 MG/ML IJ SOLN
8.0000 mg | Freq: Once | INTRAMUSCULAR | Status: AC
  Administered 2020-02-20: 8 mg via INTRAMUSCULAR
  Filled 2020-02-20: qty 1

## 2020-02-20 NOTE — ED Notes (Signed)
First RN note: per ems pt with shob, 100% on 2lpm. Per ems pt states shob started this am. Per ems pt attempted neb without relief. Vitals per ems :176/122, 92, 20, 140 fsbs. Per ems pt with wheezing in RLL.

## 2020-02-20 NOTE — ED Notes (Signed)
EDP at bedside. Pt provided food and drink.

## 2020-02-20 NOTE — ED Notes (Signed)
Pt ambulated to treatment room with steady gait. C/o of CP and SOB. When asked about CHF or COPD he states "a little". When asked if he takes meds for either he doesn't know. Pt 100% on RA. Placed on cardiac monitor. Awaiting EDP evaluation. + smoker.

## 2020-02-20 NOTE — ED Notes (Signed)
Pt provided urinal.

## 2020-02-20 NOTE — ED Provider Notes (Addendum)
[facility] Emergency Department Provider Note   ____________________________________________    I have reviewed the triage vital signs and the nursing notes.   HISTORY  Chief Complaint Abdominal Pain and Shortness of Breath (/)     HPI Carlos Nelson is a 70 y.o. male with history of schizophrenia, hypertension, COPD who presents with complaints of shortness of breath.  Patient reports he woke up in the middle of the night to go the bathroom and felt short of breath with some chest tightness.  He denies abdominal pain to me.  No fevers chills or cough.  No calf pain or leg swelling.  No nausea or vomiting.  Has not taken anything for this.  Past Medical History:  Diagnosis Date  . Chronic respiratory failure with hypoxia ([place])   . COPD (chronic obstructive pulmonary disease) ([place])   . Hypertension   . Schizophrenia [facility])     Patient Active Problem List   Diagnosis Date Noted  . HLD (hyperlipidemia) 01/18/2020  . CAD (coronary artery disease) 01/18/2020  . Hypertensive urgency 01/18/2020  . Tobacco abuse 01/18/2020  . Scrotum pain   . Elevated troponin   . Demand ischemia ([place]) 06/11/2019  . Malnutrition of moderate degree 06/10/2019  . NSTEMI (non-ST elevated myocardial infarction) ([place]) 06/09/2019  . Depression with anxiety 06/09/2019  . Pleuritic chest pain 05/20/2019  . CAP (community acquired pneumonia) 05/20/2019  . Hypertension   . COPD exacerbation ([place])   . Acute respiratory failure with hypoxia ([place])   . Altered mental status 09/25/2018  . Schizophrenia ([place])   . Tachycardia 08/21/2018  . Schizophrenia, chronic with acute exacerbation ([place]) 08/17/2018  . Elevated PSA 05/24/2015    Past Surgical History:  Procedure Laterality Date  . COLONOSCOPY    . COLONOSCOPY WITH PROPOFOL N/A 12/13/2016   Procedure: COLONOSCOPY WITH PROPOFOL;  Surgeon: Lollie Sails, MD;  Location: [facility] ENDOSCOPY;  Service: Endoscopy;   Laterality: N/A;  . ESOPHAGOGASTRODUODENOSCOPY (EGD) WITH PROPOFOL N/A 12/13/2016   Procedure: ESOPHAGOGASTRODUODENOSCOPY (EGD) WITH PROPOFOL;  Surgeon: Lollie Sails, MD;  Location: [facility] ENDOSCOPY;  Service: Endoscopy;  Laterality: N/A;  . left arm surgery      Prior to Admission medications   Medication Sig Start Date End Date Taking? Authorizing Provider  amLODipine-olmesartan (AZOR) 5-20 MG tablet Take 1 tablet by mouth daily.    [provider]  donepezil (ARICEPT) 5 MG tablet Take 5 mg by mouth at bedtime.    [provider]  hydrOXYzine (ATARAX/VISTARIL) 50 MG tablet Take 50 mg by mouth every 6 (six) hours as needed for anxiety or itching.    [provider]  ipratropium-albuterol (DUONEB) 0.5-2.5 (3) MG/3ML SOLN Take 3 mLs by nebulization every 4 (four) hours for 14 days. 01/19/20 02/02/20  Sidney Ace, MD  pantoprazole (PROTONIX) 20 MG tablet Take 1 tablet (20 mg total) by mouth daily. 12/18/19 12/17/20  Lavonia Drafts, MD  QUEtiapine (SEROQUEL) 100 MG tablet Take 100 mg by mouth daily.    [provider]  QUEtiapine (SEROQUEL) 200 MG tablet Take 3 tablets (600 mg total) by mouth at bedtime. 02/03/20   Harvest Dark, MD  vitamin B-12 (CYANOCOBALAMIN) 1000 MCG tablet Take 1,000 mcg by mouth daily.    [provider]     Allergies Ace inhibitors  Family History  Problem Relation Age of Onset  . Prostate cancer Neg Hx   . Bladder Cancer Neg Hx     Social History Social History  Tobacco Use  . Smoking status: Current Every Day Smoker    Packs/day: 1.00    Types: Cigarettes  . Smokeless tobacco: Never Used  Substance Use Topics  . Alcohol use: Yes    Alcohol/week: 0.0 standard drinks    Comment: beer  . Drug use: Not Currently    Types: Marijuana    Review of Systems  Constitutional: No fever/chills Eyes: No visual changes.  ENT: No sore throat. Cardiovascular: Chest tightness Respiratory: As  above Gastrointestinal: No abdominal pain.  No nausea, no vomiting.   Genitourinary: Negative for dysuria. Musculoskeletal: Negative for back pain. Skin: Negative for rash. Neurological: Negative for headaches    ____________________________________________   PHYSICAL EXAM:  VITAL SIGNS: ED Triage Vitals  Enc Vitals Group     BP 02/20/20 0220 (!) 164/118     Pulse Rate 02/20/20 0220 97     Resp 02/20/20 0220 20     Temp 02/20/20 0220 98.3 F (36.8 C)     Temp Source 02/20/20 0220 Oral     SpO2 02/20/20 0220 98 %     Weight 02/20/20 0226 56.7 kg (125 lb)     Height 02/20/20 0226 1.626 m (5\' 4" )     Head Circumference --      Peak Flow --      Pain Score 02/20/20 0225 7     Pain Loc --      Pain Edu? --      Excl. in [place]? --     Constitutional: Alert and oriented.  Nose: No congestion/rhinnorhea. Mouth/Throat: Mucous membranes are moist.   Neck:  Painless ROM Cardiovascular: Normal rate, regular rhythm. Grossly normal heart sounds.  Good peripheral circulation. Respiratory: Mildly increased respiratory effort with tachypnea, no retractions, scattered mild wheezes however poor air movement Gastrointestinal: Soft and nontender. No distention.  No CVA tenderness.  Musculoskeletal: No lower extremity tenderness nor edema.  Warm and well perfused Neurologic:  Normal speech and language. No gross focal neurologic deficits are appreciated.  Skin:  Skin is warm, dry and intact. No rash noted. Psychiatric: Mood and affect are normal. Speech and behavior are normal.  ____________________________________________   LABS (all labs ordered are listed, but only abnormal results are displayed)  Labs Reviewed  BASIC METABOLIC PANEL  CBC  CBG MONITORING, ED  TROPONIN I (HIGH SENSITIVITY)  TROPONIN I (HIGH SENSITIVITY)   ____________________________________________  EKG  ED ECG REPORT I, Lavonia Drafts, the attending physician, personally viewed and interpreted this  ECG.  Date: 02/20/2020  Rhythm: normal sinus rhythm QRS Axis: normal Intervals: Right bundle-branch block ST/T Wave abnormalities: normal Narrative Interpretation: no evidence of acute ischemia  ____________________________________________  RADIOLOGY  Chest x-ray reviewed by me, no infiltrate or effusion ____________________________________________   PROCEDURES  Procedure(s) performed: No  Procedures   Critical Care performed: No ____________________________________________   INITIAL IMPRESSION / ASSESSMENT AND PLAN / ED COURSE  Pertinent labs & imaging results that were available during my care of the patient were reviewed by me and considered in my medical decision making (see chart for details).  Patient presents with shortness of breath and chest tightness as above.  Poor air movement, scattered wheezing on exam highly suspicious for COPD exacerbation.  No pleurisy, no calf pain, no lower extremity swelling.  Afebrile, normal white blood cell count, no cough.  Lab work is reassuring, delta troponin is unchanged.  Chest x-ray without infiltrate or effusion.  Will treat with IM Decadron, duo nebs and reevaluate.  ----------------------------------------- 8:30 AM  on 02/20/2020 -----------------------------------------  Patient with improved airflow on exam will give additional DuoNeb and reevaluate.   ----------------------------------------- 10:05 AM on 02/20/2020 -----------------------------------------  Patient now moving much better air, no tachypnea, 100% on room air.  Appropriate for discharge, has requested 1 last DuoNeb.  He will follow up with PCP regarding his chronically elevated blood pressure    ____________________________________________   FINAL CLINICAL IMPRESSION(S) / ED DIAGNOSES  Final diagnoses:  COPD exacerbation ([place])        Note:  This document was prepared using Dragon voice recognition software and may include unintentional  dictation errors.   Lavonia Drafts, MD 02/20/20 1005    Lavonia Drafts, MD 02/20/20 1006

## 2020-02-20 NOTE — ED Notes (Addendum)
Called pt group home ([place]) at [phone] and they informed this RN that someone would come pick him up shortly.

## 2020-02-20 NOTE — ED Triage Notes (Signed)
Pt presents to ER from via via [facility] from [facility] with complaints of shortness of breath and epigastric pain. Pt reports he woke up to the bathroom and felt short of breath. Pt talks in complete sentences no distress noted

## 2020-02-21 ENCOUNTER — Other Ambulatory Visit: Payer: Self-pay

## 2020-02-21 ENCOUNTER — Emergency Department
Admission: EM | Admit: 2020-02-21 | Discharge: 2020-02-22 | Disposition: A | Discharge status: Home / Self Care | Payer: Medicare Other | Attending: Emergency Medicine | Admitting: Emergency Medicine

## 2020-02-21 DIAGNOSIS — Z79899 Other long term (current) drug therapy: Secondary | ICD-10-CM | POA: Diagnosis not present

## 2020-02-21 DIAGNOSIS — I2510 Atherosclerotic heart disease of native coronary artery without angina pectoris: Secondary | ICD-10-CM | POA: Insufficient documentation

## 2020-02-21 DIAGNOSIS — F17210 Nicotine dependence, cigarettes, uncomplicated: Secondary | ICD-10-CM | POA: Diagnosis not present

## 2020-02-21 DIAGNOSIS — J441 Chronic obstructive pulmonary disease with (acute) exacerbation: Secondary | ICD-10-CM | POA: Diagnosis not present

## 2020-02-21 DIAGNOSIS — R0602 Shortness of breath: Secondary | ICD-10-CM | POA: Diagnosis present

## 2020-02-21 DIAGNOSIS — I10 Essential (primary) hypertension: Secondary | ICD-10-CM | POA: Insufficient documentation

## 2020-02-21 MED ORDER — IPRATROPIUM-ALBUTEROL 0.5-2.5 (3) MG/3ML IN SOLN: 3.0000 mL | RESPIRATORY_TRACT | 0 refills | Status: DC

## 2020-02-21 MED ORDER — IPRATROPIUM-ALBUTEROL 0.5-2.5 (3) MG/3ML IN SOLN
3.0000 mL | Freq: Once | RESPIRATORY_TRACT | Status: AC
  Administered 2020-02-21: 3 mL via RESPIRATORY_TRACT
  Filled 2020-02-21: qty 3

## 2020-02-21 NOTE — ED Provider Notes (Signed)
[facility] Emergency Department Provider Note   ____________________________________________    I have reviewed the triage vital signs and the nursing notes.   HISTORY  Chief Complaint Shortness of Breath     HPI Carlos Nelson is a 70 y.o. male with history of COPD, hypertension, schizophrenia who presents with complaints of shortness of breath.  Patient notes that he felt his chest tighten up and was having shortness of breath consistent with his COPD exacerbation, he used a breathing treatment at home but felt that it did not help, EMS gave a nebulizer and now he feels "fine ".  Patient seen by me yesterday for similar complaints.  Past Medical History:  Diagnosis Date  . Chronic respiratory failure with hypoxia ([place])   . COPD (chronic obstructive pulmonary disease) ([place])   . Hypertension   . Schizophrenia [facility])     Patient Active Problem List   Diagnosis Date Noted  . HLD (hyperlipidemia) 01/18/2020  . CAD (coronary artery disease) 01/18/2020  . Hypertensive urgency 01/18/2020  . Tobacco abuse 01/18/2020  . Scrotum pain   . Elevated troponin   . Demand ischemia ([place]) 06/11/2019  . Malnutrition of moderate degree 06/10/2019  . NSTEMI (non-ST elevated myocardial infarction) ([place]) 06/09/2019  . Depression with anxiety 06/09/2019  . Pleuritic chest pain 05/20/2019  . CAP (community acquired pneumonia) 05/20/2019  . Hypertension   . COPD exacerbation ([place])   . Acute respiratory failure with hypoxia ([place])   . Altered mental status 09/25/2018  . Schizophrenia ([place])   . Tachycardia 08/21/2018  . Schizophrenia, chronic with acute exacerbation ([place]) 08/17/2018  . Elevated PSA 05/24/2015    Past Surgical History:  Procedure Laterality Date  . COLONOSCOPY    . COLONOSCOPY WITH PROPOFOL N/A 12/13/2016   Procedure: COLONOSCOPY WITH PROPOFOL;  Surgeon: Lollie Sails, MD;  Location: [facility] ENDOSCOPY;  Service: Endoscopy;  Laterality:  N/A;  . ESOPHAGOGASTRODUODENOSCOPY (EGD) WITH PROPOFOL N/A 12/13/2016   Procedure: ESOPHAGOGASTRODUODENOSCOPY (EGD) WITH PROPOFOL;  Surgeon: Lollie Sails, MD;  Location: [facility] ENDOSCOPY;  Service: Endoscopy;  Laterality: N/A;  . left arm surgery      Prior to Admission medications   Medication Sig Start Date End Date Taking? Authorizing Provider  amLODipine-olmesartan (AZOR) 5-20 MG tablet Take 1 tablet by mouth daily.    [provider]  donepezil (ARICEPT) 5 MG tablet Take 5 mg by mouth at bedtime.    [provider]  hydrOXYzine (ATARAX/VISTARIL) 50 MG tablet Take 50 mg by mouth every 6 (six) hours as needed for anxiety or itching.    [provider]  ipratropium-albuterol (DUONEB) 0.5-2.5 (3) MG/3ML SOLN Take 3 mLs by nebulization every 4 (four) hours for 14 days. 02/21/20 03/06/20  Lavonia Drafts, MD  pantoprazole (PROTONIX) 20 MG tablet Take 1 tablet (20 mg total) by mouth daily. 12/18/19 12/17/20  Lavonia Drafts, MD  QUEtiapine (SEROQUEL) 100 MG tablet Take 100 mg by mouth daily.    [provider]  QUEtiapine (SEROQUEL) 200 MG tablet Take 3 tablets (600 mg total) by mouth at bedtime. 02/03/20   Harvest Dark, MD  vitamin B-12 (CYANOCOBALAMIN) 1000 MCG tablet Take 1,000 mcg by mouth daily.    [provider]     Allergies Ace inhibitors  Family History  Problem Relation Age of Onset  . Prostate cancer Neg Hx   . Bladder Cancer Neg Hx     Social History Social History   Tobacco Use  . Smoking status: Current  Every Day Smoker    Packs/day: 1.00    Types: Cigarettes  . Smokeless tobacco: Never Used  Substance Use Topics  . Alcohol use: Yes    Alcohol/week: 0.0 standard drinks    Comment: beer  . Drug use: Not Currently    Types: Marijuana    Review of Systems  Constitutional: No fever/chills Eyes: No visual changes.  ENT: No sore throat. Cardiovascular: As above Respiratory: As above Gastrointestinal: No abdominal  pain.  No nausea, no vomiting.   Genitourinary: Negative for dysuria. Musculoskeletal: Negative for back pain. Skin: Negative for rash. Neurological: Negative for headaches or weakness   ____________________________________________   PHYSICAL EXAM:  VITAL SIGNS: ED Triage Vitals  Enc Vitals Group     BP 02/21/20 2137 (!) 162/105     Pulse Rate 02/21/20 2137 (!) 102     Resp 02/21/20 2137 (!) 22     Temp 02/21/20 2137 98 F (36.7 C)     Temp Source 02/21/20 2137 Oral     SpO2 02/21/20 2132 98 %     Weight 02/21/20 2140 56.7 kg (125 lb)     Height 02/21/20 2140 1.626 m (5\' 4" )     Head Circumference --      Peak Flow --      Pain Score 02/21/20 2140 0     Pain Loc --      Pain Edu? --      Excl. in [place]? --     Constitutional: Alert and oriented. No acute distress. Pleasant and interactive  Nose: No congestion/rhinnorhea. Mouth/Throat: Mucous membranes are moist.   Neck:  Painless ROM Cardiovascular: Normal rate, regular rhythm. Grossly normal heart sounds.  Good peripheral circulation. Respiratory: Normal respiratory effort.  No retractions. Lungs CTAB.  No significant wheezing, exam is significantly improved from yesterday Gastrointestinal: Soft and nontender. No distention.  No CVA tenderness.  Musculoskeletal: No lower extremity tenderness nor edema.  Warm and well perfused Neurologic:  Normal speech and language. No gross focal neurologic deficits are appreciated.  Skin:  Skin is warm, dry and intact. No rash noted. Psychiatric: Mood and affect are normal. Speech and behavior are normal.  ____________________________________________   LABS (all labs ordered are listed, but only abnormal results are displayed)  Labs Reviewed - No data to display ____________________________________________  EKG  ED ECG REPORT I, Lavonia Drafts, the attending physician, personally viewed and interpreted this ECG.  Date: 02/21/2020  Rhythm: normal sinus rhythm QRS Axis:  normal Intervals: normal ST/T Wave abnormalities: Nonspecific changes Narrative Interpretation: no evidence of acute ischemia  ____________________________________________  RADIOLOGY  None ____________________________________________   PROCEDURES  Procedure(s) performed: No  Procedures   Critical Care performed: No ____________________________________________   INITIAL IMPRESSION / ASSESSMENT AND PLAN / ED COURSE  Pertinent labs & imaging results that were available during my care of the patient were reviewed by me and considered in my medical decision making (see chart for details).  Patient presents with complaints of shortness of breath, improved after DuoNeb given in EMS.  Quite comfortable here in the emergency department.  Respiratory rate 18 on my exam, 100% on room air.  No significant wheezing and good airflow noted on exam, will give additional DuoNeb here.  Patient is requesting food, which we will provide, question whether this may be the reason that he came in today.  On reassessment patient significantly improved, he states he feels well and is ready to go, will refill nebulizer meds, strict return precautions discussed  ____________________________________________   FINAL CLINICAL IMPRESSION(S) / ED DIAGNOSES  Final diagnoses:  COPD exacerbation ([place])        Note:  This document was prepared using Dragon voice recognition software and may include unintentional dictation errors.   Lavonia Drafts, MD 02/29/20 [phone]

## 2020-02-21 NOTE — ED Notes (Signed)
Rainbow collection sent to lab.

## 2020-02-21 NOTE — ED Notes (Addendum)
Per Fernando Salinas [phone]) no one can leave and come get patient until AM.

## 2020-02-21 NOTE — ED Triage Notes (Addendum)
Pt is a 70 y/o male with a hx of COPD coming in via EMS for SOB x2 days. Pt 98% on RA. Neb tx administered in field which provided relief. No increase WOB noted. Denies other complaints at this time. A&O x4.

## 2020-03-01 ENCOUNTER — Other Ambulatory Visit: Payer: Self-pay

## 2020-03-01 ENCOUNTER — Emergency Department: Payer: Medicare Other

## 2020-03-01 ENCOUNTER — Emergency Department
Admission: EM | Admit: 2020-03-01 | Discharge: 2020-03-01 | Disposition: A | Discharge status: Home / Self Care | Payer: Medicare Other | Attending: Emergency Medicine | Admitting: Emergency Medicine

## 2020-03-01 DIAGNOSIS — R079 Chest pain, unspecified: Secondary | ICD-10-CM

## 2020-03-01 DIAGNOSIS — Z79899 Other long term (current) drug therapy: Secondary | ICD-10-CM | POA: Diagnosis not present

## 2020-03-01 DIAGNOSIS — R0602 Shortness of breath: Secondary | ICD-10-CM | POA: Diagnosis present

## 2020-03-01 DIAGNOSIS — I10 Essential (primary) hypertension: Secondary | ICD-10-CM | POA: Diagnosis not present

## 2020-03-01 DIAGNOSIS — I2510 Atherosclerotic heart disease of native coronary artery without angina pectoris: Secondary | ICD-10-CM | POA: Insufficient documentation

## 2020-03-01 DIAGNOSIS — J441 Chronic obstructive pulmonary disease with (acute) exacerbation: Secondary | ICD-10-CM

## 2020-03-01 DIAGNOSIS — F17210 Nicotine dependence, cigarettes, uncomplicated: Secondary | ICD-10-CM | POA: Insufficient documentation

## 2020-03-01 LAB — TROPONIN I (HIGH SENSITIVITY)
Troponin I (High Sensitivity): 7 ng/L (ref <18)
Troponin I (High Sensitivity): 7 ng/L (ref <18)

## 2020-03-01 LAB — CBC WITH DIFFERENTIAL/PLATELET
Abs Immature Granulocytes: 0.02 10*3/uL (ref 0.00–0.07)
Basophils Absolute: 0 10*3/uL (ref 0.0–0.1)
Basophils Relative: 0 %
Eosinophils Absolute: 0.1 10*3/uL (ref 0.0–0.5)
Eosinophils Relative: 1 %
HCT: 45.7 % (ref 39.0–52.0)
Hemoglobin: 14.9 g/dL (ref 13.0–17.0)
Immature Granulocytes: 0 %
Lymphocytes Relative: 20 %
Lymphs Abs: 1.7 10*3/uL (ref 0.7–4.0)
MCH: 29.9 pg (ref 26.0–34.0)
MCHC: 32.6 g/dL (ref 30.0–36.0)
MCV: 91.8 fL (ref 80.0–100.0)
Monocytes Absolute: 0.6 10*3/uL (ref 0.1–1.0)
Monocytes Relative: 7 %
Neutro Abs: 6 10*3/uL (ref 1.7–7.7)
Neutrophils Relative %: 72 %
Platelets: 188 10*3/uL (ref 150–400)
RBC: 4.98 MIL/uL (ref 4.22–5.81)
RDW: 12.9 % (ref 11.5–15.5)
WBC: 8.4 10*3/uL (ref 4.0–10.5)
nRBC: 0 % (ref 0.0–0.2)

## 2020-03-01 LAB — BASIC METABOLIC PANEL
Anion gap: 7 (ref 5–15)
BUN: 11 mg/dL (ref 8–23)
CO2: 29 mmol/L (ref 22–32)
Calcium: 9.1 mg/dL (ref 8.9–10.3)
Chloride: 101 mmol/L (ref 98–111)
Creatinine, Ser: 1.11 mg/dL (ref 0.61–1.24)
GFR, Estimated: >60 mL/min (ref >60)
Glucose, Bld: 126 mg/dL — ABNORMAL HIGH (ref 70–99)
Potassium: 4.3 mmol/L (ref 3.5–5.1)
Sodium: 137 mmol/L (ref 135–145)

## 2020-03-01 MED ORDER — ALBUTEROL SULFATE HFA 108 (90 BASE) MCG/ACT IN AERS
2.0000 | INHALATION_SPRAY | RESPIRATORY_TRACT | Status: DC | PRN
  Administered 2020-03-01: 2 via RESPIRATORY_TRACT
  Filled 2020-03-01 (×2): qty 6.7

## 2020-03-01 MED ORDER — PREDNISONE 20 MG PO TABS: 60.0000 mg | ORAL_TABLET | Freq: Every day | ORAL | 0 refills | Status: DC

## 2020-03-01 MED ORDER — ALBUTEROL SULFATE (2.5 MG/3ML) 0.083% IN NEBU
5.0000 mg | INHALATION_SOLUTION | Freq: Once | RESPIRATORY_TRACT | Status: AC
  Administered 2020-03-01: 5 mg via RESPIRATORY_TRACT
  Filled 2020-03-01: qty 6

## 2020-03-01 MED ORDER — ACETAMINOPHEN 500 MG PO TABS
1000.0000 mg | ORAL_TABLET | Freq: Once | ORAL | Status: AC
  Administered 2020-03-01: 1000 mg via ORAL
  Filled 2020-03-01: qty 2

## 2020-03-01 MED ORDER — IPRATROPIUM-ALBUTEROL 0.5-2.5 (3) MG/3ML IN SOLN
3.0000 mL | Freq: Once | RESPIRATORY_TRACT | Status: AC
  Administered 2020-03-01: 3 mL via RESPIRATORY_TRACT
  Filled 2020-03-01: qty 3

## 2020-03-01 MED ORDER — ALBUTEROL SULFATE HFA 108 (90 BASE) MCG/ACT IN AERS: 2.0000 | INHALATION_SPRAY | RESPIRATORY_TRACT | 0 refills | Status: DC | PRN

## 2020-03-01 NOTE — ED Triage Notes (Signed)
Pt arrives EMS for c/o shortness of breath. Pt has hx of COPD and has been out of his inhaler. EMS states on scene room air sat was 90% and pt was using accessory muscles to breath. EMS gace 2 duonebs and placed pt on 3 L [place]. EMS started a 20 g in the left forearm and gave 125 mg of Solumedrol. Upn arrival pt is maintaining room air sats of 96%.

## 2020-03-01 NOTE — ED Provider Notes (Signed)
[facility] Emergency Department Provider Note  ____________________________________________   Event Date/Time   First MD Initiated Contact with Patient 03/01/20 [phone]     (approximate)  I have reviewed the triage vital signs and the nursing notes.   HISTORY  Chief Complaint Respiratory Distress and Shortness of Breath    HPI Carlos Nelson is a 70 y.o. male with history of COPD, hypertension, schizophrenia who presents to the emergency department EMS with shortness of breath that woke him from sleep this morning.  Ran out of his albuterol inhaler about a week ago.  No documented fevers.  Reports diffuse chest tightness.  Received 2 DuoNebs with EMS and 125 mg of IV Solu-Medrol.  States he is feeling better.  Room air sats 93% initially with EMS and improved to 100% after 2 DuoNebs.        Past Medical History:  Diagnosis Date  . Chronic respiratory failure with hypoxia ([place])   . COPD (chronic obstructive pulmonary disease) ([place])   . Hypertension   . Schizophrenia [facility])     Patient Active Problem List   Diagnosis Date Noted  . HLD (hyperlipidemia) 01/18/2020  . CAD (coronary artery disease) 01/18/2020  . Hypertensive urgency 01/18/2020  . Tobacco abuse 01/18/2020  . Scrotum pain   . Elevated troponin   . Demand ischemia ([place]) 06/11/2019  . Malnutrition of moderate degree 06/10/2019  . NSTEMI (non-ST elevated myocardial infarction) ([place]) 06/09/2019  . Depression with anxiety 06/09/2019  . Pleuritic chest pain 05/20/2019  . CAP (community acquired pneumonia) 05/20/2019  . Hypertension   . COPD exacerbation ([place])   . Acute respiratory failure with hypoxia ([place])   . Altered mental status 09/25/2018  . Schizophrenia ([place])   . Tachycardia 08/21/2018  . Schizophrenia, chronic with acute exacerbation ([place]) 08/17/2018  . Elevated PSA 05/24/2015    Past Surgical History:  Procedure Laterality Date  . COLONOSCOPY    . COLONOSCOPY WITH  PROPOFOL N/A 12/13/2016   Procedure: COLONOSCOPY WITH PROPOFOL;  Surgeon: Lollie Sails, MD;  Location: [facility] ENDOSCOPY;  Service: Endoscopy;  Laterality: N/A;  . ESOPHAGOGASTRODUODENOSCOPY (EGD) WITH PROPOFOL N/A 12/13/2016   Procedure: ESOPHAGOGASTRODUODENOSCOPY (EGD) WITH PROPOFOL;  Surgeon: Lollie Sails, MD;  Location: [facility] ENDOSCOPY;  Service: Endoscopy;  Laterality: N/A;  . left arm surgery      Prior to Admission medications   Medication Sig Start Date End Date Taking? Authorizing Provider  albuterol (VENTOLIN HFA) 108 (90 Base) MCG/ACT inhaler Inhale 2 puffs into the lungs every 4 (four) hours as needed for wheezing or shortness of breath. 03/01/20  Yes Laguana Desautel, Delice Bison, DO  amLODipine-olmesartan (AZOR) 5-20 MG tablet Take 1 tablet by mouth daily.   Yes [provider]  ARIPiprazole (ABILIFY) 30 MG tablet Take 30 mg by mouth daily. 02/29/20  Yes [provider]  donepezil (ARICEPT) 5 MG tablet Take 5 mg by mouth at bedtime.   Yes [provider]  hydrOXYzine (ATARAX/VISTARIL) 50 MG tablet Take 50 mg by mouth every 6 (six) hours as needed for anxiety or itching.   Yes [provider]  ipratropium-albuterol (DUONEB) 0.5-2.5 (3) MG/3ML SOLN Take 3 mLs by nebulization every 4 (four) hours for 14 days. 02/21/20 03/06/20 Yes Lavonia Drafts, MD  pantoprazole (PROTONIX) 20 MG tablet Take 1 tablet (20 mg total) by mouth daily. 12/18/19 12/17/20 Yes Lavonia Drafts, MD  predniSONE (DELTASONE) 20 MG tablet Take 3 tablets (60 mg total) by mouth daily. 03/01/20  Yes Aeva Posey, Delice Bison,  DO  QUEtiapine (SEROQUEL) 100 MG tablet Take 100 mg by mouth daily.   Yes [provider]  QUEtiapine (SEROQUEL) 200 MG tablet Take 3 tablets (600 mg total) by mouth at bedtime. 02/03/20  Yes Harvest Dark, MD  traZODone (DESYREL) 100 MG tablet Take 100 mg by mouth at bedtime. 02/24/20  Yes [provider]  vitamin B-12 (CYANOCOBALAMIN) 1000 MCG tablet Take 1,000 mcg  by mouth daily.   Yes [provider]  QUEtiapine (SEROQUEL) 25 MG tablet Take 25 mg by mouth at bedtime. Patient not taking: Reported on 03/01/2020 02/25/20   [provider]    Allergies Ace inhibitors  Family History  Problem Relation Age of Onset  . Prostate cancer Neg Hx   . Bladder Cancer Neg Hx     Social History Social History   Tobacco Use  . Smoking status: Current Every Day Smoker    Packs/day: 1.00    Types: Cigarettes  . Smokeless tobacco: Never Used  Substance Use Topics  . Alcohol use: Yes    Alcohol/week: 0.0 standard drinks    Comment: beer  . Drug use: Not Currently    Types: Marijuana    Review of Systems -limited as patient is a very poor historian Constitutional: No fever. Eyes: No visual changes. ENT: No sore throat. Cardiovascular: + chest pain. Respiratory: + shortness of breath. Gastrointestinal: No nausea, vomiting, diarrhea. Genitourinary: Negative for dysuria. Musculoskeletal: Negative for back pain. Skin: Negative for rash. Neurological: Negative for focal weakness or numbness.  ____________________________________________   PHYSICAL EXAM:  VITAL SIGNS: ED Triage Vitals  Enc Vitals Group     BP 03/01/20 0255 (!) 164/103     Pulse Rate 03/01/20 0255 99     Resp 03/01/20 0255 (!) 26     Temp 03/01/20 0255 (!) 97.5 F (36.4 C)     Temp Source 03/01/20 0255 Oral     SpO2 03/01/20 0255 94 %     Weight 03/01/20 0256 128 lb (58.1 kg)     Height 03/01/20 0256 5\' 4"  (1.626 m)     Head Circumference --      Peak Flow --      Pain Score 03/01/20 0256 8     Pain Loc --      Pain Edu? --      Excl. in [place]? --    CONSTITUTIONAL: Alert and oriented and responds appropriately to questions.  Elderly.  Chronically ill-appearing.  In no distress.  Poor historian. HEAD: Normocephalic EYES: Conjunctivae clear, pupils appear equal, EOM appear intact ENT: normal nose; moist mucous membranes NECK: Supple, normal ROM CARD: RRR;  S1 and S2 appreciated; no murmurs, no clicks, no rubs, no gallops RESP: Diminished aeration diffusely.  No respiratory distress.  Speaking full sentences.  Mildly tachypneic.  No expiratory or inspiratory wheezing.  No rhonchi or rales. ABD/GI: Normal bowel sounds; non-distended; soft, non-tender, no rebound, no guarding, no peritoneal signs, no hepatosplenomegaly BACK: The back appears normal EXT: Normal ROM in all joints; no deformity noted, no edema; no cyanosis, no calf tenderness or calf swelling SKIN: Normal color for age and race; warm; no rash on exposed skin NEURO: Moves all extremities equally PSYCH: The patient's mood and manner are appropriate.  ____________________________________________   LABS (all labs ordered are listed, but only abnormal results are displayed)  Labs Reviewed  BASIC METABOLIC PANEL - Abnormal; Notable for the following components:      Result Value   Glucose, Bld 126 (*)  All other components within normal limits  CBC WITH DIFFERENTIAL/PLATELET  TROPONIN I (HIGH SENSITIVITY)  TROPONIN I (HIGH SENSITIVITY)   ____________________________________________  EKG   Date: 03/01/2020 3:04 AM  Rate: 94  Rhythm: normal sinus rhythm  QRS Axis: normal  Intervals: normal  ST/T Wave abnormalities: normal  Conduction Disutrbances: none  Narrative Interpretation: unremarkable     ____________________________________________  RADIOLOGY I, Phyliss Hulick, personally viewed and evaluated these images (plain radiographs) as part of my medical decision making, as well as reviewing the written report by the radiologist.  ED MD interpretation: Emphysematous changes  Official radiology report(s): DG Chest Portable 1 View  Result Date: 03/01/2020 CLINICAL DATA:  Shortness of breath.  History of COPD. EXAM: PORTABLE CHEST 1 VIEW COMPARISON:  02/20/2020 FINDINGS: Chronic hyperinflation and apical predominant emphysema. No acute airspace disease. Normal heart  size and mediastinal contours. No pleural fluid or pneumothorax. No acute osseous abnormalities are seen. IMPRESSION: Chronic hyperinflation and emphysema. No acute abnormality. Electronically Signed   By: Keith Rake M.D.   On: 03/01/2020 03:20    ____________________________________________   PROCEDURES  Procedure(s) performed (including Critical Care): ___   INITIAL IMPRESSION / ASSESSMENT AND PLAN / ED COURSE  As part of my medical decision making, I reviewed the following data within the [place] notes reviewed and incorporated, Labs reviewed, EKG interpreted NSR, Old EKG reviewed, Old chart reviewed, Radiograph reviewed and Notes from prior ED visits         Patient here with COPD exacerbation.  Reports improvement with duo nebs and Solu-Medrol with EMS.  Will give another DuoNeb here and continue to monitor.  He is complaining of chest tightness likely from his COPD but will obtain troponin x2 given he does have risk factors for ACS and is a poor historian due to his history of schizophrenia.  EKG nonischemic.  Doubt PE, dissection.  Afebrile here.  No signs of volume overload.    3:57 AM  Pt's breath sounds are improving but still complaining of some chest tightness.  Will give another breathing treatment.  Labs are unremarkable including normal white count and normal troponin.  Chest x-ray shows emphysematous changes but no acute abnormality.  Second troponin pending.  He is asking that we refill his albuterol inhaler as well.   5:30 AM  Pt's repeat troponin is flat.  I feel he is safe for discharge home.  Provided with albuterol inhaler.  Will discharge on prednisone burst.   At this time, I do not feel there is any life-threatening condition present. I have reviewed, interpreted and discussed all results (EKG, imaging, lab, urine as appropriate) and exam findings with patient/family. I have reviewed nursing notes and appropriate previous  records.  I feel the patient is safe to be discharged home without further emergent workup and can continue workup as an outpatient as needed. Discussed usual and customary return precautions. Patient/family verbalize understanding and are comfortable with this plan.  Outpatient follow-up has been provided as needed. All questions have been answered.  ____________________________________________   FINAL CLINICAL IMPRESSION(S) / ED DIAGNOSES  Final diagnoses:  COPD exacerbation ([place])  Chest pain, unspecified type     ED Discharge Orders         Ordered    albuterol (VENTOLIN HFA) 108 (90 Base) MCG/ACT inhaler  Every 4 hours PRN        03/01/20 0531    predniSONE (DELTASONE) 20 MG tablet  Daily  03/01/20 0531          *Please note:  TIMOHTY RENBARGER was evaluated in Emergency Department on 03/01/2020 for the symptoms described in the history of present illness. He was evaluated in the context of the global COVID-19 pandemic, which necessitated consideration that the patient might be at risk for infection with the SARS-CoV-2 virus that causes COVID-19. Institutional protocols and algorithms that pertain to the evaluation of patients at risk for COVID-19 are in a state of rapid change based on information released by regulatory bodies including the CDC and federal and state organizations. These policies and algorithms were followed during the patient's care in the ED.  Some ED evaluations and interventions may be delayed as a result of limited staffing during and the pandemic.*   Note:  This document was prepared using Dragon voice recognition software and may include unintentional dictation errors.   Iris Tatsch, Delice Bison, DO 03/01/20 [phone]

## 2020-03-01 NOTE — ED Notes (Signed)
This RN attempted to call [facility]. No answer at this time. Voicemail left. Charge RN made aware.

## 2020-03-01 NOTE — ED Notes (Signed)
Pts O2 remained at 93% on room air during ambulation.

## 2020-03-10 ENCOUNTER — Emergency Department: Payer: Medicare Other

## 2020-03-10 ENCOUNTER — Observation Stay
Admission: EM | Admit: 2020-03-10 | Discharge: 2020-03-11 | Disposition: A | Discharge status: Home / Self Care | Payer: Medicare Other | Attending: Internal Medicine | Admitting: Internal Medicine

## 2020-03-10 ENCOUNTER — Encounter: Payer: Self-pay | Admitting: Internal Medicine

## 2020-03-10 ENCOUNTER — Other Ambulatory Visit: Payer: Self-pay

## 2020-03-10 DIAGNOSIS — I10 Essential (primary) hypertension: Secondary | ICD-10-CM | POA: Diagnosis not present

## 2020-03-10 DIAGNOSIS — I252 Old myocardial infarction: Secondary | ICD-10-CM | POA: Insufficient documentation

## 2020-03-10 DIAGNOSIS — Z79899 Other long term (current) drug therapy: Secondary | ICD-10-CM | POA: Diagnosis not present

## 2020-03-10 DIAGNOSIS — R0602 Shortness of breath: Secondary | ICD-10-CM

## 2020-03-10 DIAGNOSIS — I160 Hypertensive urgency: Secondary | ICD-10-CM | POA: Diagnosis present

## 2020-03-10 DIAGNOSIS — J441 Chronic obstructive pulmonary disease with (acute) exacerbation: Principal | ICD-10-CM | POA: Diagnosis present

## 2020-03-10 DIAGNOSIS — J9600 Acute respiratory failure, unspecified whether with hypoxia or hypercapnia: Secondary | ICD-10-CM | POA: Diagnosis present

## 2020-03-10 DIAGNOSIS — J449 Chronic obstructive pulmonary disease, unspecified: Secondary | ICD-10-CM | POA: Insufficient documentation

## 2020-03-10 DIAGNOSIS — J9621 Acute and chronic respiratory failure with hypoxia: Secondary | ICD-10-CM

## 2020-03-10 DIAGNOSIS — I2510 Atherosclerotic heart disease of native coronary artery without angina pectoris: Secondary | ICD-10-CM | POA: Insufficient documentation

## 2020-03-10 DIAGNOSIS — J9601 Acute respiratory failure with hypoxia: Secondary | ICD-10-CM | POA: Diagnosis not present

## 2020-03-10 DIAGNOSIS — Z20822 Contact with and (suspected) exposure to covid-19: Secondary | ICD-10-CM | POA: Insufficient documentation

## 2020-03-10 DIAGNOSIS — F209 Schizophrenia, unspecified: Secondary | ICD-10-CM | POA: Diagnosis present

## 2020-03-10 DIAGNOSIS — F17210 Nicotine dependence, cigarettes, uncomplicated: Secondary | ICD-10-CM | POA: Insufficient documentation

## 2020-03-10 DIAGNOSIS — R0789 Other chest pain: Secondary | ICD-10-CM

## 2020-03-10 DIAGNOSIS — J9622 Acute and chronic respiratory failure with hypercapnia: Secondary | ICD-10-CM | POA: Diagnosis present

## 2020-03-10 DIAGNOSIS — R079 Chest pain, unspecified: Secondary | ICD-10-CM

## 2020-03-10 LAB — CBC WITH DIFFERENTIAL/PLATELET
Abs Immature Granulocytes: 0.03 10*3/uL (ref 0.00–0.07)
Basophils Absolute: 0 10*3/uL (ref 0.0–0.1)
Basophils Relative: 0 %
Eosinophils Absolute: 0.1 10*3/uL (ref 0.0–0.5)
Eosinophils Relative: 1 %
HCT: 49.7 % (ref 39.0–52.0)
Hemoglobin: 16.1 g/dL (ref 13.0–17.0)
Immature Granulocytes: 0 %
Lymphocytes Relative: 25 %
Lymphs Abs: 2 10*3/uL (ref 0.7–4.0)
MCH: 29.8 pg (ref 26.0–34.0)
MCHC: 32.4 g/dL (ref 30.0–36.0)
MCV: 91.9 fL (ref 80.0–100.0)
Monocytes Absolute: 0.7 10*3/uL (ref 0.1–1.0)
Monocytes Relative: 9 %
Neutro Abs: 4.9 10*3/uL (ref 1.7–7.7)
Neutrophils Relative %: 65 %
Platelets: 235 10*3/uL (ref 150–400)
RBC: 5.41 MIL/uL (ref 4.22–5.81)
RDW: 13.2 % (ref 11.5–15.5)
WBC: 7.8 10*3/uL (ref 4.0–10.5)
nRBC: 0 % (ref 0.0–0.2)

## 2020-03-10 LAB — BASIC METABOLIC PANEL
Anion gap: 7 (ref 5–15)
BUN: 19 mg/dL (ref 8–23)
CO2: 29 mmol/L (ref 22–32)
Calcium: 9.3 mg/dL (ref 8.9–10.3)
Chloride: 101 mmol/L (ref 98–111)
Creatinine, Ser: 1.17 mg/dL (ref 0.61–1.24)
GFR, Estimated: >60 mL/min (ref >60)
Glucose, Bld: 85 mg/dL (ref 70–99)
Potassium: 4.7 mmol/L (ref 3.5–5.1)
Sodium: 137 mmol/L (ref 135–145)

## 2020-03-10 LAB — RESP PANEL BY RT-PCR (FLU A&B, COVID) ARPGX2
Influenza A by PCR: NEGATIVE
Influenza B by PCR: NEGATIVE
SARS Coronavirus 2 by RT PCR: NEGATIVE

## 2020-03-10 LAB — BRAIN NATRIURETIC PEPTIDE: B Natriuretic Peptide: 25.9 pg/mL (ref 0.0–100.0)

## 2020-03-10 LAB — HIV ANTIBODY (ROUTINE TESTING W REFLEX): HIV Screen 4th Generation wRfx: NONREACTIVE

## 2020-03-10 MED ORDER — SODIUM CHLORIDE 0.9 % IV SOLN
1.0000 g | INTRAVENOUS | Status: DC
  Administered 2020-03-10: 1 g via INTRAVENOUS
  Filled 2020-03-10: qty 10

## 2020-03-10 MED ORDER — PREDNISONE 20 MG PO TABS
40.0000 mg | ORAL_TABLET | Freq: Every day | ORAL | Status: DC
  Administered 2020-03-11: 40 mg via ORAL
  Filled 2020-03-10: qty 2

## 2020-03-10 MED ORDER — AMLODIPINE-OLMESARTAN 5-20 MG PO TABS: 1.0000 | ORAL_TABLET | Freq: Every day | ORAL | Status: DC

## 2020-03-10 MED ORDER — METHYLPREDNISOLONE SODIUM SUCC 40 MG IJ SOLR
40.0000 mg | Freq: Two times a day (BID) | INTRAMUSCULAR | Status: AC
  Administered 2020-03-10: 40 mg via INTRAVENOUS
  Filled 2020-03-10: qty 1

## 2020-03-10 MED ORDER — IRBESARTAN 150 MG PO TABS
150.0000 mg | ORAL_TABLET | Freq: Every day | ORAL | Status: DC
  Administered 2020-03-10 – 2020-03-11 (×2): 150 mg via ORAL
  Filled 2020-03-10 (×2): qty 1

## 2020-03-10 MED ORDER — QUETIAPINE FUMARATE 25 MG PO TABS
25.0000 mg | ORAL_TABLET | Freq: Every day | ORAL | Status: DC
  Administered 2020-03-11: 25 mg via ORAL
  Filled 2020-03-10: qty 1

## 2020-03-10 MED ORDER — LABETALOL HCL 5 MG/ML IV SOLN
10.0000 mg | INTRAVENOUS | Status: DC | PRN
  Administered 2020-03-10 – 2020-03-11 (×2): 10 mg via INTRAVENOUS
  Filled 2020-03-10 (×2): qty 4

## 2020-03-10 MED ORDER — ARIPIPRAZOLE 15 MG PO TABS
15.0000 mg | ORAL_TABLET | Freq: Every day | ORAL | Status: DC
  Administered 2020-03-11: 15 mg via ORAL
  Filled 2020-03-10 (×3): qty 1

## 2020-03-10 MED ORDER — HYDRALAZINE HCL 20 MG/ML IJ SOLN
10.0000 mg | Freq: Once | INTRAMUSCULAR | Status: AC | PRN
  Administered 2020-03-10: 10 mg via INTRAVENOUS
  Filled 2020-03-10: qty 1

## 2020-03-10 MED ORDER — METHYLPREDNISOLONE SODIUM SUCC 40 MG IJ SOLR
40.0000 mg | Freq: Four times a day (QID) | INTRAMUSCULAR | Status: DC
  Administered 2020-03-10: 40 mg via INTRAVENOUS
  Filled 2020-03-10: qty 1

## 2020-03-10 MED ORDER — DONEPEZIL HCL 5 MG PO TABS
5.0000 mg | ORAL_TABLET | Freq: Every day | ORAL | Status: DC
  Administered 2020-03-10: 5 mg via ORAL
  Filled 2020-03-10 (×2): qty 1

## 2020-03-10 MED ORDER — VITAMIN B-12 1000 MCG PO TABS
1000.0000 ug | ORAL_TABLET | Freq: Every day | ORAL | Status: DC
  Administered 2020-03-11: 1000 ug via ORAL
  Filled 2020-03-10: qty 1

## 2020-03-10 MED ORDER — KETOROLAC TROMETHAMINE 30 MG/ML IJ SOLN
30.0000 mg | Freq: Once | INTRAMUSCULAR | Status: AC
  Administered 2020-03-10: 30 mg via INTRAVENOUS
  Filled 2020-03-10: qty 1

## 2020-03-10 MED ORDER — AMLODIPINE BESYLATE 5 MG PO TABS
5.0000 mg | ORAL_TABLET | Freq: Every day | ORAL | Status: DC
  Administered 2020-03-10 – 2020-03-11 (×2): 5 mg via ORAL
  Filled 2020-03-10 (×2): qty 1

## 2020-03-10 MED ORDER — METHYLPREDNISOLONE SODIUM SUCC 125 MG IJ SOLR
125.0000 mg | Freq: Once | INTRAMUSCULAR | Status: AC
  Administered 2020-03-10: 125 mg via INTRAVENOUS
  Filled 2020-03-10: qty 2

## 2020-03-10 MED ORDER — ENOXAPARIN SODIUM 40 MG/0.4ML ~~LOC~~ SOLN
40.0000 mg | SUBCUTANEOUS | Status: DC
  Administered 2020-03-10 – 2020-03-11 (×2): 40 mg via SUBCUTANEOUS
  Filled 2020-03-10 (×2): qty 0.4

## 2020-03-10 MED ORDER — PANTOPRAZOLE SODIUM 20 MG PO TBEC
20.0000 mg | DELAYED_RELEASE_TABLET | Freq: Every day | ORAL | Status: DC
  Administered 2020-03-10 – 2020-03-11 (×2): 20 mg via ORAL
  Filled 2020-03-10 (×2): qty 1

## 2020-03-10 MED ORDER — PREDNISONE 20 MG PO TABS: 40.0000 mg | ORAL_TABLET | Freq: Every day | ORAL | Status: DC

## 2020-03-10 MED ORDER — IPRATROPIUM-ALBUTEROL 0.5-2.5 (3) MG/3ML IN SOLN
3.0000 mL | Freq: Four times a day (QID) | RESPIRATORY_TRACT | Status: DC
  Administered 2020-03-10 – 2020-03-11 (×7): 3 mL via RESPIRATORY_TRACT
  Filled 2020-03-10 (×9): qty 3

## 2020-03-10 MED ORDER — HYDRALAZINE HCL 20 MG/ML IJ SOLN
10.0000 mg | Freq: Four times a day (QID) | INTRAMUSCULAR | Status: DC | PRN
  Administered 2020-03-10: 10 mg via INTRAVENOUS
  Filled 2020-03-10 (×2): qty 1

## 2020-03-10 MED ORDER — ALBUTEROL SULFATE (2.5 MG/3ML) 0.083% IN NEBU
2.5000 mg | INHALATION_SOLUTION | RESPIRATORY_TRACT | Status: DC | PRN
  Filled 2020-03-10: qty 3

## 2020-03-10 MED ORDER — QUETIAPINE FUMARATE 300 MG PO TABS
600.0000 mg | ORAL_TABLET | Freq: Every day | ORAL | Status: DC
Start: 2020-03-10 — End: 2020-03-11
  Administered 2020-03-10: 600 mg via ORAL
  Filled 2020-03-10 (×2): qty 2

## 2020-03-10 NOTE — ED Notes (Signed)
Pharmacy contacted requesting verification of Hydralazine for RN to administer.

## 2020-03-10 NOTE — Progress Notes (Signed)
[facility] [place] NAME: Tyrice Hewitt    MR#:  [id number]  DATE OF BIRTH:  07-19-1950  SUBJECTIVE:   In from family care home with increasing shortness of breath found to be hypoxic with sats in the 80s on room air. Saturations 87 to 99% on 2 L nasal cannula. Able to complete sentence without shortness of breath. Tolerating PO diet. REVIEW OF SYSTEMS:   Review of Systems  Constitutional: Negative for chills, fever and weight loss.  HENT: Negative for ear discharge, ear pain and nosebleeds.   Eyes: Negative for blurred vision, pain and discharge.  Respiratory: Positive for shortness of breath. Negative for sputum production, wheezing and stridor.   Cardiovascular: Negative for chest pain, palpitations, orthopnea and PND.  Gastrointestinal: Negative for abdominal pain, diarrhea, nausea and vomiting.  Genitourinary: Negative for frequency and urgency.  Musculoskeletal: Negative for back pain and joint pain.  Neurological: Negative for sensory change, speech change, focal weakness and weakness.  Psychiatric/Behavioral: Negative for depression and hallucinations. The patient is not nervous/anxious.    Tolerating Diet:yes Tolerating PT: ambulating in the room by himself  DRUG ALLERGIES:   Allergies  Allergen Reactions  . Ace Inhibitors Swelling    Ok with benazapril, per grouphome    VITALS:  Blood pressure (!) 179/93, pulse (!) 108, resp. rate (!) 24, SpO2 98 %.  PHYSICAL EXAMINATION:   Physical Exam  GENERAL:  70 y.o.-year-old patient lying in the bed with no acute distress.  LUNGS: distant breath sounds bilaterally, no wheezing, rales, rhonchi. No use of accessory muscles of respiration.  CARDIOVASCULAR: S1, S2 normal. No murmurs, rubs, or gallops. Tachycardia ABDOMEN: Soft, nontender, nondistended. Bowel sounds present. No organomegaly or mass.  EXTREMITIES: No cyanosis, clubbing or edema b/l.    NEUROLOGIC: Cranial nerves II  through XII are intact. No focal Motor or sensory deficits b/l.   PSYCHIATRIC:  patient is alert and awake.  SKIN: No obvious rash, lesion, or ulcer.   LABORATORY PANEL:  CBC Recent Labs  Lab 03/10/20 0251  WBC 7.8  HGB 16.1  HCT 49.7  PLT 235    Chemistries  Recent Labs  Lab 03/10/20 0251  NA 137  K 4.7  CL 101  CO2 29  GLUCOSE 85  BUN 19  CREATININE 1.17  CALCIUM 9.3   Cardiac Enzymes No results for input(s): TROPONINI in the last 168 hours. RADIOLOGY:  DG Chest Port 1 View  Result Date: 03/10/2020 CLINICAL DATA:  Shortness of breath, history of COPD EXAM: PORTABLE CHEST 1 VIEW COMPARISON:  Radiograph 03/01/2020 FINDINGS: Chronic hyperinflation and emphysematous changes throughout the lungs. No new consolidative opacity is seen. No convincing features of edema. Stable cardiomediastinal contours. No acute osseous or soft tissue abnormality. Degenerative changes are present in the imaged spine and shoulders. Telemetry leads overlie the chest. IMPRESSION: Chronic hyperinflation and emphysematous changes. No acute cardiopulmonary abnormality. Electronically Signed   By: Lovena Le M.D.   On: 03/10/2020 03:06   ASSESSMENT AND PLAN:   70 year old male with history of HTN, COPD, schizophrenia, hospitalized from 1/3 to 01/19/2020 with acute respiratory failure secondary to COPD, sent from group home with worsening shortness of breath not responding to home bronchodilators.   COPD with acute exacerbation ([place])   Acute respiratory failure with hypoxia ([place]) -Patient presents with wheezing typical of COPD exacerbations with increased work of breathing  -COVID flu test negative.  Chest x-ray no acute findings other than emphysema -Scheduled and as  needed nebulized bronchodilator therapy -IV steroids -Flutter valve, mucolytic's -Patient will benefit from maintenance inhaler LABA/steroid or other at discharge, not seen on current med list, outpatient pulmonology referral -- no  indications for antibiotics discontinued Rocephin    Hypertensive urgency -Systolic BP in the 060O ER -Hydralazine IV as needed for now -Continue home amlodipine/olmesartan  -- IV PRN hydralazine    Right-sided chest pain -Atypical and likely related to increased work of breathing.  EKG nonacute -Patient had low risk stress test and unremarkable echo May 2021    Schizophrenia [facility]) -Continue home meds     DVT prophylaxis: Lovenox  Code Status: full code  Family Communication:  none  Disposition Plan: Back to previous home environment Consults called: none  Status:At the time of admission, it appears that the appropriate admission status for this patient is INPATIENT  Patient currently is on 2 L nasal cannula oxygen. Sats are anywhere from 87 to 99%. He continues to smoke. Advised smoking cessation does not appear motivated. Will assess for home oxygen need.  Continues to show improvement likely discharge tomorrow    TOTAL TIME TAKING CARE OF THIS PATIENT: *25* minutes.  >50% time spent on counselling and coordination of care  Note: This dictation was prepared with Dragon dictation along with smaller phrase technology. Any transcriptional errors that result from this process are unintentional.  Fritzi Mandes M.D    [facility]   CC: Primary care physician; [facility], [facility] MedicalPatient ID: [name], male   DOB: [birth year], 70 y.o.   MRN: [id number]

## 2020-03-10 NOTE — ED Provider Notes (Signed)
[facility] Emergency Department Provider Note   ____________________________________________   Event Date/Time   First MD Initiated Contact with Patient 03/10/20 0245     (approximate)  I have reviewed the triage vital signs and the nursing notes.   HISTORY  Chief Complaint Shortness of Breath    HPI Carlos Nelson is a 70 y.o. male with a past medical history of COPD, hypertension, and schizophrenia who presents from a long-term care facility via EMS complaining of shortness of breath.  EMS state that they found patient tripoding on arrival with an oxygen saturation of 81% to that improved to the high 90s on 3 L nasal cannula.  Patient was given 1 DuoNeb in route with partial improvement of his shortness of breath.  Patient also endorses 8/10 central chest tightness that does not radiate and is worsened with coughing.  Patient currently denies any vision changes, tinnitus, difficulty speaking, facial droop, sore throat, abdominal pain, nausea/vomiting/diarrhea, dysuria, or weakness/numbness/paresthesias in any extremity         Past Medical History:  Diagnosis Date  . Chronic respiratory failure with hypoxia ([place])   . COPD (chronic obstructive pulmonary disease) ([place])   . Hypertension   . Schizophrenia [facility])     Patient Active Problem List   Diagnosis Date Noted  . HLD (hyperlipidemia) 01/18/2020  . CAD (coronary artery disease) 01/18/2020  . Hypertensive urgency 01/18/2020  . Tobacco abuse 01/18/2020  . Scrotum pain   . Elevated troponin   . Demand ischemia ([place]) 06/11/2019  . Malnutrition of moderate degree 06/10/2019  . NSTEMI (non-ST elevated myocardial infarction) ([place]) 06/09/2019  . Depression with anxiety 06/09/2019  . Pleuritic chest pain 05/20/2019  . CAP (community acquired pneumonia) 05/20/2019  . Hypertension   . COPD exacerbation ([place])   . Acute respiratory failure with hypoxia ([place])   . Altered mental status 09/25/2018   . Schizophrenia ([place])   . Tachycardia 08/21/2018  . Schizophrenia, chronic with acute exacerbation ([place]) 08/17/2018  . Elevated PSA 05/24/2015    Past Surgical History:  Procedure Laterality Date  . COLONOSCOPY    . COLONOSCOPY WITH PROPOFOL N/A 12/13/2016   Procedure: COLONOSCOPY WITH PROPOFOL;  Surgeon: Lollie Sails, MD;  Location: [facility] ENDOSCOPY;  Service: Endoscopy;  Laterality: N/A;  . ESOPHAGOGASTRODUODENOSCOPY (EGD) WITH PROPOFOL N/A 12/13/2016   Procedure: ESOPHAGOGASTRODUODENOSCOPY (EGD) WITH PROPOFOL;  Surgeon: Lollie Sails, MD;  Location: [facility] ENDOSCOPY;  Service: Endoscopy;  Laterality: N/A;  . left arm surgery      Prior to Admission medications   Medication Sig Start Date End Date Taking? Authorizing Provider  albuterol (VENTOLIN HFA) 108 (90 Base) MCG/ACT inhaler Inhale 2 puffs into the lungs every 4 (four) hours as needed for wheezing or shortness of breath. 03/01/20   Ward, Delice Bison, DO  amLODipine-olmesartan (AZOR) 5-20 MG tablet Take 1 tablet by mouth daily.    [provider]  ARIPiprazole (ABILIFY) 30 MG tablet Take 30 mg by mouth daily. 02/29/20   [provider]  donepezil (ARICEPT) 5 MG tablet Take 5 mg by mouth at bedtime.    [provider]  hydrOXYzine (ATARAX/VISTARIL) 50 MG tablet Take 50 mg by mouth every 6 (six) hours as needed for anxiety or itching.    [provider]  ipratropium-albuterol (DUONEB) 0.5-2.5 (3) MG/3ML SOLN Take 3 mLs by nebulization every 4 (four) hours for 14 days. 02/21/20 03/06/20  Lavonia Drafts, MD  pantoprazole (PROTONIX) 20 MG tablet Take 1 tablet (20 mg total)  by mouth daily. 12/18/19 12/17/20  Lavonia Drafts, MD  predniSONE (DELTASONE) 20 MG tablet Take 3 tablets (60 mg total) by mouth daily. 03/01/20   Ward, Delice Bison, DO  QUEtiapine (SEROQUEL) 100 MG tablet Take 100 mg by mouth daily.    [provider]  QUEtiapine (SEROQUEL) 200 MG tablet Take 3 tablets (600 mg total) by mouth  at bedtime. 02/03/20   Harvest Dark, MD  QUEtiapine (SEROQUEL) 25 MG tablet Take 25 mg by mouth at bedtime. Patient not taking: Reported on 03/01/2020 02/25/20   [provider]  traZODone (DESYREL) 100 MG tablet Take 100 mg by mouth at bedtime. 02/24/20   [provider]  vitamin B-12 (CYANOCOBALAMIN) 1000 MCG tablet Take 1,000 mcg by mouth daily.    [provider]    Allergies Ace inhibitors  Family History  Problem Relation Age of Onset  . Prostate cancer Neg Hx   . Bladder Cancer Neg Hx     Social History Social History   Tobacco Use  . Smoking status: Current Every Day Smoker    Packs/day: 1.00    Types: Cigarettes  . Smokeless tobacco: Never Used  Substance Use Topics  . Alcohol use: Yes    Alcohol/week: 0.0 standard drinks    Comment: beer  . Drug use: Not Currently    Types: Marijuana    Review of Systems Constitutional: No fever/chills Eyes: No visual changes. ENT: No sore throat. Cardiovascular: Endorses chest pain. Respiratory: Endorses shortness of breath. Gastrointestinal: No abdominal pain.  No nausea, no vomiting.  No diarrhea. Genitourinary: Negative for dysuria. Musculoskeletal: Negative for acute arthralgias Skin: Negative for rash. Neurological: Negative for headaches, weakness/numbness/paresthesias in any extremity Psychiatric: Negative for suicidal ideation/homicidal ideation   ____________________________________________   PHYSICAL EXAM:  VITAL SIGNS: ED Triage Vitals  Enc Vitals Group     BP 03/10/20 0245 (!) 161/104     Pulse Rate 03/10/20 0245 (!) 36     Resp 03/10/20 0245 (!) 37     Temp --      Temp src --      SpO2 03/10/20 0242 95 %     Weight --      Height --      Head Circumference --      Peak Flow --      Pain Score 03/10/20 0248 8     Pain Loc --      Pain Edu? --      Excl. in [place]? --    Constitutional: Alert and oriented. Well appearing and in no acute distress. Eyes: Conjunctivae  are normal. PERRL. Head: Atraumatic. Nose: No congestion/rhinnorhea. Mouth/Throat: Mucous membranes are moist. Neck: No stridor Cardiovascular: Grossly normal heart sounds.  Good peripheral circulation. Respiratory: Normal respiratory effort.  No retractions.  Inspiratory and expiratory wheezes over bilateral lung fields Gastrointestinal: Soft and nontender. No distention. Musculoskeletal: No obvious deformities Neurologic:  Normal speech and language. No gross focal neurologic deficits are appreciated. Skin:  Skin is warm and dry. No rash noted. Psychiatric: Mood and affect are normal. Speech and behavior are normal.  ____________________________________________   LABS (all labs ordered are listed, but only abnormal results are displayed)  Labs Reviewed  RESP PANEL BY RT-PCR (FLU A&B, COVID) ARPGX2  BASIC METABOLIC PANEL  CBC WITH DIFFERENTIAL/PLATELET  BRAIN NATRIURETIC PEPTIDE   ____________________________________________  EKG  ED ECG REPORT I, Naaman Plummer, the attending physician, personally viewed and interpreted this ECG.  Date: 03/10/2020 EKG Time: 0248 Rate: 111  Rhythm: Tachycardic sinus rhythm QRS Axis: normal Intervals: normal ST/T Wave abnormalities: normal Narrative Interpretation: no evidence of acute ischemia  ____________________________________________  RADIOLOGY  ED MD interpretation: One-view portable chest x-ray shows no evidence of acute abnormalities including no pneumonia, pneumothorax, or widened mediastinum.  Does note chronic hyperinflation and emphysematous changes  Official radiology report(s): DG Chest Port 1 View  Result Date: 03/10/2020 CLINICAL DATA:  Shortness of breath, history of COPD EXAM: PORTABLE CHEST 1 VIEW COMPARISON:  Radiograph 03/01/2020 FINDINGS: Chronic hyperinflation and emphysematous changes throughout the lungs. No new consolidative opacity is seen. No convincing features of edema. Stable cardiomediastinal contours.  No acute osseous or soft tissue abnormality. Degenerative changes are present in the imaged spine and shoulders. Telemetry leads overlie the chest. IMPRESSION: Chronic hyperinflation and emphysematous changes. No acute cardiopulmonary abnormality. Electronically Signed   By: Lovena Le M.D.   On: 03/10/2020 03:06    ____________________________________________   PROCEDURES  Procedure(s) performed (including Critical Care):  .1-3 Lead EKG Interpretation Performed by: Naaman Plummer, MD Authorized by: Naaman Plummer, MD     Interpretation: abnormal     ECG rate:  110   ECG rate assessment: tachycardic     Rhythm: sinus tachycardia     Ectopy: none     Conduction: normal   .Critical Care Performed by: Naaman Plummer, MD Authorized by: Naaman Plummer, MD   Critical care provider statement:    Critical care time (minutes):  35   Critical care time was exclusive of:  Separately billable procedures and treating other patients   Critical care was necessary to treat or prevent imminent or life-threatening deterioration of the following conditions:  Respiratory failure   Critical care was time spent personally by me on the following activities:  Discussions with consultants, evaluation of patient's response to treatment, examination of patient, ordering and performing treatments and interventions, ordering and review of laboratory studies, ordering and review of radiographic studies, pulse oximetry, re-evaluation of patient's condition, obtaining history from patient or surrogate and review of old charts   I assumed direction of critical care for this patient from another provider in my specialty: no     Care discussed with: admitting provider       ____________________________________________   INITIAL IMPRESSION / [place] / ED COURSE  As part of my medical decision making, I reviewed the following data within the [place] notes reviewed and  incorporated, Labs reviewed, EKG interpreted, Old chart reviewed, Radiograph reviewed and Notes from prior ED visits reviewed and incorporated        The patient appears to be suffering from a moderate/severe exacerbation of COPD.  Based on the history, exam, CXR/EKG reviewed by me, and further workup I dont suspect any other emergent cause of this presentation, such as pneumonia, acute coronary syndrome, congestive heart failure, pulmonary embolism, or pneumothorax.  ED Interventions: bronchodilators, steroids, antibiotics, reassess  Reassessment: After treatment, the patients shortness of breath is improving but patient is still requiring supplemental oxygenation via 2 L nasal cannula  Disposition: Admit      ____________________________________________   FINAL CLINICAL IMPRESSION(S) / ED DIAGNOSES  Final diagnoses:  COPD exacerbation ([place])  Shortness of breath  Acute on chronic respiratory failure with hypoxia (H[facility])  Chest tightness     ED Discharge Orders    None       Note:  This document was prepared using Dragon voice recognition software and may include unintentional dictation errors.  Naaman Plummer, MD 03/10/20 [phone]

## 2020-03-10 NOTE — Plan of Care (Signed)
?  Problem: Activity: ?Goal: Ability to tolerate increased activity will improve ?Outcome: Progressing ?  ?Problem: Respiratory: ?Goal: Levels of oxygenation will improve ?Outcome: Progressing ?  ?

## 2020-03-10 NOTE — ED Notes (Signed)
Patient provided with warm blanket per request. Assisted with positioning for comfort in stretcher. Stretcher low and locked with side rails raised x2 and call bell in reach. Cardiac monitor in place and oxygen- 2L [place] in use. Patient breathing unlabored and speaking in full sentences. RN to continue to monitor.

## 2020-03-10 NOTE — ED Notes (Signed)
Pt brought soda per request, tolerating well

## 2020-03-10 NOTE — Progress Notes (Signed)
OT Cancellation Note  Patient Details Name: Carlos Nelson MRN: [id number] DOB: [birth year]   Cancelled Treatment:    Reason Eval/Treat Not Completed: Medical issues which prohibited therapy. OT order received and chart reviewed. Pt noted to have most recent BP outside of safe parameters for therapy evlauation at this time (BP 179/93) and HR continues to be elevated >100 since this AM. Will follow remotely, evaluate when medically appropriate.   Dessie Coma, M.S. OTR/L  03/10/20, 3:06 PM  ascom [phone]

## 2020-03-10 NOTE — ED Notes (Signed)
Dr. Posey Pronto secure messaged about BP, new RN's added to convo.

## 2020-03-10 NOTE — ED Notes (Signed)
Pt calling out for assistance- upon entering room pt asking to eat and drink -- per Dr Cheri Fowler pt ok to eat-- pt provided express meal sandwich and cola; denies any other needs/questions/concerns at this time.

## 2020-03-10 NOTE — ED Notes (Signed)
Meds due at 1600 not given as meds are not yet verified by pharmacy

## 2020-03-10 NOTE — ED Notes (Signed)
Ambulated pt and observed Sp02 to be 82-84% on RA with ambulation.

## 2020-03-10 NOTE — ED Notes (Signed)
Pt resting comfortably at this time. NAD noted. Pt on 2L/min via [place]. BP high at this time per night shift RN this has been ongoing and treated with IVP hydralazine. Will contact admitting MD if pt stays hypertensive. Call bell in reach. NAD noted at this time.

## 2020-03-10 NOTE — H&P (Signed)
History and Physical    Carlos Nelson DOB: [birth year] DOA: 03/10/2020  PCP: [place]   Patient coming from: Group home  I have personally briefly reviewed patient's old medical records in [place]  Chief Complaint: Wheezing  HPI: Carlos Nelson is a 70 y.o. male with medical history significant for HTN, COPD, schizophrenia, hospitalized from 1/3 to 01/19/2020 with acute respiratory failure secondary to COPD, sent from group home with worsening shortness of breath not responding to his home inhalers.  Also complained of right-sided chest discomfort worse with breathing, nonradiating.  Has an associated dry cough.  Denies fever or chills..  On arrival of EMS he was tripoding, O2 sat 81% on room air placed on 3 L O2.  Patient received 2 DuoNeb's in route ED Course: On arrival, afebrile, BP as high as 192/161, pulse 119, respirations 25-35 with initial O2 sat 85% on room air improving to the low 90s on 2 L.  CBC, BMP and BNP all within normal limits.  Covid negative flu negative. EKG as reviewed by me : Sinus tach at 111 Imaging: No acute cardiopulmonary disease  Patient received additional DuoNeb treatment but continued to have increased work of breathing.  Hospitalist consulted for admission.  Review of Systems: As per HPI otherwise all other systems on review of systems negative.    Past Medical History:  Diagnosis Date  . Chronic respiratory failure with hypoxia ([place])   . COPD (chronic obstructive pulmonary disease) ([place])   . Hypertension   . Schizophrenia [facility]     Past Surgical History:  Procedure Laterality Date  . COLONOSCOPY    . COLONOSCOPY WITH PROPOFOL N/A 12/13/2016   Procedure: COLONOSCOPY WITH PROPOFOL;  Surgeon: Lollie Sails, MD;  Location: [facility] ENDOSCOPY;  Service: Endoscopy;  Laterality: N/A;  . ESOPHAGOGASTRODUODENOSCOPY (EGD) WITH PROPOFOL N/A 12/13/2016   Procedure: ESOPHAGOGASTRODUODENOSCOPY (EGD) WITH  PROPOFOL;  Surgeon: Lollie Sails, MD;  Location: [facility] ENDOSCOPY;  Service: Endoscopy;  Laterality: N/A;  . left arm surgery       reports that he has been smoking cigarettes. He has been smoking about 1.00 pack per day. He has never used smokeless tobacco. He reports current alcohol use. He reports previous drug use. Drug: Marijuana.  Allergies  Allergen Reactions  . Ace Inhibitors Swelling    Ok with benazapril, per grouphome    Family History  Problem Relation Age of Onset  . Prostate cancer Neg Hx   . Bladder Cancer Neg Hx       Prior to Admission medications   Medication Sig Start Date End Date Taking? Authorizing Provider  albuterol (VENTOLIN HFA) 108 (90 Base) MCG/ACT inhaler Inhale 2 puffs into the lungs every 4 (four) hours as needed for wheezing or shortness of breath. 03/01/20   Ward, Delice Bison, DO  amLODipine-olmesartan (AZOR) 5-20 MG tablet Take 1 tablet by mouth daily.    [provider]  ARIPiprazole (ABILIFY) 30 MG tablet Take 30 mg by mouth daily. 02/29/20   [provider]  donepezil (ARICEPT) 5 MG tablet Take 5 mg by mouth at bedtime.    [provider]  hydrOXYzine (ATARAX/VISTARIL) 50 MG tablet Take 50 mg by mouth every 6 (six) hours as needed for anxiety or itching.    [provider]  ipratropium-albuterol (DUONEB) 0.5-2.5 (3) MG/3ML SOLN Take 3 mLs by nebulization every 4 (four) hours for 14 days. 02/21/20 03/06/20  Lavonia Drafts, MD  pantoprazole (PROTONIX) 20 MG tablet Take 1  tablet (20 mg total) by mouth daily. 12/18/19 12/17/20  Lavonia Drafts, MD  predniSONE (DELTASONE) 20 MG tablet Take 3 tablets (60 mg total) by mouth daily. 03/01/20   Ward, Delice Bison, DO  QUEtiapine (SEROQUEL) 100 MG tablet Take 100 mg by mouth daily.    [provider]  QUEtiapine (SEROQUEL) 200 MG tablet Take 3 tablets (600 mg total) by mouth at bedtime. 02/03/20   Harvest Dark, MD  QUEtiapine (SEROQUEL) 25 MG tablet Take 25 mg by mouth  at bedtime. Patient not taking: Reported on 03/01/2020 02/25/20   [provider]  traZODone (DESYREL) 100 MG tablet Take 100 mg by mouth at bedtime. 02/24/20   [provider]  vitamin B-12 (CYANOCOBALAMIN) 1000 MCG tablet Take 1,000 mcg by mouth daily.    [provider]    Physical Exam: Vitals:   03/10/20 0300 03/10/20 0330 03/10/20 0400 03/10/20 0430  BP: (!) 188/117 (!) 187/115 (!) 192/161 (!) 178/113  Pulse: (!) 111 (!) 107 (!) 102 (!) 109  Resp: (!) 27 (!) 29 (!) 25 (!) 35  SpO2: 100% 99% 99% (!) 74%     Vitals:   03/10/20 0300 03/10/20 0330 03/10/20 0400 03/10/20 0430  BP: (!) 188/117 (!) 187/115 (!) 192/161 (!) 178/113  Pulse: (!) 111 (!) 107 (!) 102 (!) 109  Resp: (!) 27 (!) 29 (!) 25 (!) 35  SpO2: 100% 99% 99% (!) 74%      Constitutional: Alert and oriented x 3 .  Moderate respiratory distress HEENT:      Head: Normocephalic and atraumatic.         Eyes: PERLA, EOMI, Conjunctivae are normal. Sclera is non-icteric.       Mouth/Throat: Mucous membranes are moist.       Neck: Supple with no signs of meningismus. Cardiovascular: R tachycardic no murmurs, gallops, or rubs. 2+ symmetrical distal pulses are present . No JVD. No LE edema Respiratory: Respiratory effort increased.Lungs sounds diminished bilaterally.  Scattered rhonchi.  Gastrointestinal: Soft, non tender, and non distended with positive bowel sounds.  Genitourinary: No CVA tenderness. Musculoskeletal: Nontender with normal range of motion in all extremities. No cyanosis, or erythema of extremities. Neurologic:  Face is symmetric. Moving all extremities. No gross focal neurologic deficits . Skin: Skin is warm, dry.  No rash or ulcers Psychiatric: Mood and affect are normal    Labs on Admission: I have personally reviewed following labs and imaging studies  CBC: Recent Labs  Lab 03/10/20 0251  WBC 7.8  NEUTROABS 4.9  HGB 16.1  HCT 49.7  MCV 91.9  PLT 782   Basic Metabolic  Panel: Recent Labs  Lab 03/10/20 0251  NA 137  K 4.7  CL 101  CO2 29  GLUCOSE 85  BUN 19  CREATININE 1.17  CALCIUM 9.3   GFR: Estimated Creatinine Clearance: 48.3 mL/min (by C-G formula based on SCr of 1.17 mg/dL). Liver Function Tests: No results for input(s): AST, ALT, ALKPHOS, BILITOT, PROT, ALBUMIN in the last 168 hours. No results for input(s): LIPASE, AMYLASE in the last 168 hours. No results for input(s): AMMONIA in the last 168 hours. Coagulation Profile: No results for input(s): INR, PROTIME in the last 168 hours. Cardiac Enzymes: No results for input(s): CKTOTAL, CKMB, CKMBINDEX, TROPONINI in the last 168 hours. BNP (last 3 results) No results for input(s): PROBNP in the last 8760 hours. HbA1C: No results for input(s): HGBA1C in the last 72 hours. CBG: No results for input(s): GLUCAP in the last 168  hours. Lipid Profile: No results for input(s): CHOL, HDL, LDLCALC, TRIG, CHOLHDL, LDLDIRECT in the last 72 hours. Thyroid Function Tests: No results for input(s): TSH, T4TOTAL, FREET4, T3FREE, THYROIDAB in the last 72 hours. Anemia Panel: No results for input(s): VITAMINB12, FOLATE, FERRITIN, TIBC, IRON, RETICCTPCT in the last 72 hours. Urine analysis:    Component Value Date/Time   COLORURINE YELLOW (A) 12/18/2019 0213   APPEARANCEUR CLEAR (A) 12/18/2019 0213   APPEARANCEUR Clear 09/04/2019 1429   LABSPEC 1.017 12/18/2019 0213   LABSPEC 1.014 11/09/2013 1525   PHURINE 6.0 12/18/2019 0213   GLUCOSEU NEGATIVE 12/18/2019 0213   GLUCOSEU Negative 11/09/2013 1525   HGBUR NEGATIVE 12/18/2019 0213   BILIRUBINUR NEGATIVE 12/18/2019 0213   BILIRUBINUR Negative 09/04/2019 1429   BILIRUBINUR Negative 11/09/2013 [place] 12/18/2019 0213   PROTEINUR NEGATIVE 12/18/2019 0213   NITRITE NEGATIVE 12/18/2019 0213   LEUKOCYTESUR NEGATIVE 12/18/2019 0213   LEUKOCYTESUR Negative 11/09/2013 1525    Radiological Exams on Admission: DG Chest Port 1  View  Result Date: 03/10/2020 CLINICAL DATA:  Shortness of breath, history of COPD EXAM: PORTABLE CHEST 1 VIEW COMPARISON:  Radiograph 03/01/2020 FINDINGS: Chronic hyperinflation and emphysematous changes throughout the lungs. No new consolidative opacity is seen. No convincing features of edema. Stable cardiomediastinal contours. No acute osseous or soft tissue abnormality. Degenerative changes are present in the imaged spine and shoulders. Telemetry leads overlie the chest. IMPRESSION: Chronic hyperinflation and emphysematous changes. No acute cardiopulmonary abnormality. Electronically Signed   By: Lovena Le M.D.   On: 03/10/2020 03:06     Assessment/Plan 70 year old male with history of HTN, COPD, schizophrenia, hospitalized from 1/3 to 01/19/2020 with acute respiratory failure secondary to COPD, sent from group home with worsening shortness of breath not responding to home bronchodilators.   COPD with acute exacerbation ([place])   Acute respiratory failure with hypoxia ([place]) -Patient presents with wheezing typical of COPD exacerbations with increased work of breathing unable to speak in complete sentences with O2 sat 85% on room air in the setting of similar hospitalization a month prior for the same -COVID flu test negative.  Chest x-ray no acute findings -Scheduled and as needed nebulized bronchodilator therapy -IV steroids -Flutter valve, mucolytic's -Patient will benefit from maintenance inhaler LABA/steroid or other at discharge, not seen on current med list, outpatient pulmonology referral    Hypertensive urgency -Systolic BP in the 865H ER -Hydralazine IV as needed for now -Continue home amlodipine/olmesartan or formulary substitution    Right-sided chest pain -Atypical and likely related to increased work of breathing.  EKG nonacute -Patient had low risk stress test and unremarkable echo May 2021    Schizophrenia [facility]) -Continue home meds pending med rec    DVT prophylaxis:  Lovenox  Code Status: full code  Family Communication:  none  Disposition Plan: Back to previous home environment Consults called: none  Status:At the time of admission, it appears that the appropriate admission status for this patient is INPATIENT. This is judged to be reasonable and necessary in order to provide the required intensity of service to ensure the patient's safety given the presenting symptoms, physical exam findings, and initial radiographic and laboratory data in the context of their  Comorbid conditions.   Patient requires inpatient status due to high intensity of service, high risk for further deterioration and high frequency of surveillance required.   I certify that at the point of admission it is my clinical judgment that the patient will require inpatient hospital care  spanning beyond [place] V Tao Satz MD [facility]     03/10/2020, 4:38 AM

## 2020-03-10 NOTE — ED Triage Notes (Signed)
Pt BIBA c/o of shortness of breath. Pt has hx of COPD. EMS states pt has been tripodding since arrival. EMS reports pt initial O2 sat was 95% on RA. Pt also c/o of R sided chest tightness, EMS reports chest pain unremarkable. Pt rated chest pain 8/10.   Upon arrival pt is still tachypenic and O2 sat 81% on RA. Pt placed on 3lpm via [place].

## 2020-03-10 NOTE — ED Notes (Signed)
Pt given remote per request, pt watching tv at this time

## 2020-03-10 NOTE — ED Notes (Signed)
Pt is having issues keeping cardiac monitor in place at this time, pt educated on importance, pt verbalized understanding and states "Ill try to do better'. Pulse ox is also having difficulty keeping a good pleth, multiple sites changed for probe efficiency, pt is 95-100% on 2L/min.

## 2020-03-10 NOTE — Progress Notes (Signed)
PT Cancellation Note  Patient Details Name: Carlos Nelson MRN: [id number] DOB: 1950-05-27   Cancelled Treatment:    Reason Eval/Treat Not Completed: Medical issues which prohibited therapy Pt here with COPD exacerbation; it appears O2 levels are improving however BP and HR have been elevated since arrival.  This morning diastolic pressures continue to be >100, HR has also been >100 since arrival.  Will hold PT this AM and continue to follow/communicate with nursing to appropriately address this patient regarding PT/activity.    Kreg Shropshire, DPT 03/10/2020, 7:56 AM

## 2020-03-11 DIAGNOSIS — I160 Hypertensive urgency: Secondary | ICD-10-CM | POA: Diagnosis not present

## 2020-03-11 DIAGNOSIS — J441 Chronic obstructive pulmonary disease with (acute) exacerbation: Secondary | ICD-10-CM

## 2020-03-11 DIAGNOSIS — J9601 Acute respiratory failure with hypoxia: Secondary | ICD-10-CM

## 2020-03-11 MED ORDER — IPRATROPIUM-ALBUTEROL 0.5-2.5 (3) MG/3ML IN SOLN: 3.0000 mL | RESPIRATORY_TRACT | 0 refills | Status: AC

## 2020-03-11 MED ORDER — PREDNISONE 20 MG PO TABS: 40.0000 mg | ORAL_TABLET | Freq: Every day | ORAL | 0 refills | Status: AC

## 2020-03-11 MED ORDER — ALBUTEROL SULFATE HFA 108 (90 BASE) MCG/ACT IN AERS
2.0000 | INHALATION_SPRAY | RESPIRATORY_TRACT | 2 refills | Status: DC | PRN
Start: 2020-03-11 — End: 2020-04-02

## 2020-03-11 NOTE — Progress Notes (Signed)
OT Cancellation Note  Patient Details Name: Carlos Nelson MRN: [id number] DOB: Jul 29, 1950   Cancelled Treatment:    Reason Eval/Treat Not Completed: OT screened, no needs identified, will sign off. Order received, chart reviewed. Per conversation with PT, pt at baseline functional independence. No skilled acute OT needs identified. Will sign off. Please re-consult if additional needs arise.   Dessie Coma, M.S. OTR/L  03/11/20, 10:05 AM  ascom [phone]

## 2020-03-11 NOTE — Discharge Instructions (Signed)
Pt advised smoking cessation

## 2020-03-11 NOTE — Care Management Important Message (Signed)
Important Message  Patient Details  Name: BRAXSTON QUINTER MRN: [id number] Date of Birth: 1950-02-08   Medicare Important Message Given:        Kerin Salen, RN 03/11/2020, 2:13 PM

## 2020-03-11 NOTE — Progress Notes (Signed)
SATURATION QUALIFICATIONS: (This note is used to comply with regulatory documentation for home oxygen)  Patient Saturations on Room Air at Rest = 98%  Patient Saturations on Room Air while Ambulating = 96%   

## 2020-03-11 NOTE — TOC Transition Note (Signed)
Transition of Care [facility]) - CM/SW Discharge Note   Patient Details  Name: FILMORE MOLYNEUX MRN: [id number] Date of Birth: [birth year]  Transition of Care [facility]) CM/SW Contact:  Kerin Salen, RN Phone Number: 03/11/2020, 2:38 PM    Final next level of care: Group Home Barriers to Discharge: Barriers Resolved   Patient Goals and CMS Choice Patient states their goals for this hospitalization and ongoing recovery are:: To return to [place]   Choice offered to / list presented to : NA  Discharge Placement                Patient to be transferred to facility by: Ladona Mow Name of family member notified: Angelena Sole Patient and family notified of of transfer: 03/11/20  Discharge Plan and Services                DME Arranged: N/A DME Agency: NA       HH Arranged: NA HH Agency: NA        Social Determinants of Health (SDOH) Interventions     Readmission Risk Interventions No flowsheet data found.

## 2020-03-11 NOTE — Evaluation (Signed)
Physical Therapy Evaluation Patient Details Name: Carlos Nelson MRN: [id number] DOB: January 21, 1950 Today's Date: 03/11/2020   History of Present Illness  Carlos Nelson is a 66M who comes to [facility] 2/24 c dyspnea, admitted in COPD exacerbation. Pt desturating in ED whilst AMB. PMH: HTN, HLD, COPD, depression, GAD, schizophrenia, CAD, NICM, NSTEMI, tobacco abuse, medical noncompiance.  Clinical Impression  Pt admitted with above diagnosis. Pt currently with functional limitations due to the deficits listed below (see "PT Problem List"). Upon entry, pt in bed, awake and agreeable to participate. Pt familiar to our services from multiple recent admissions. The pt is alert, pleasant, interactive, and able to provide info regarding prior level of function, both in tolerance and independence. Pt able to perform all basic mobility this date without any obvious weakness, asymmetry, unsteadiness, or LOB. Pt does seem somewhat impulsive at times, which may present the only obvious potential safe risk. Pt has increased WOB with distances of AMB approaching limited community distance, but no concerning level of desaturation, remains in 90s.%. Patient is at nearing baseline, all education completed, and time is given to address all questions/concerns. No additional skilled PT services needed at this time, PT signing off. PT recommends daily ambulation ad lib or with nursing staff as needed to prevent deconditioning.       Follow Up Recommendations No PT follow up    Equipment Recommendations  None recommended by PT    Recommendations for Other Services       Precautions / Restrictions Precautions Precautions: None      Mobility  Bed Mobility Overal bed mobility: Independent                  Transfers Overall transfer level: Independent                  Ambulation/Gait Ambulation/Gait assistance: Independent Gait Distance (Feet): 380 Feet Assistive device: None Gait  Pattern/deviations: WFL(Within Functional Limits) Gait velocity: 1.21m/s (1.42m/s last year when admitted)   General Gait Details: Moves quickly, no unsteadiness. increased WOB/DOE after 2nd lap in hallway  Stairs            Wheelchair Mobility    Modified Rankin (Stroke Patients Only)       Balance Overall balance assessment: Independent;No apparent balance deficits (not formally assessed)                                           Pertinent Vitals/Pain Pain Assessment: Faces Faces Pain Scale: Hurts even more Pain Location: CP with breathing Pain Intervention(s): Limited activity within patient's tolerance;Monitored during session;Premedicated before session    Home Living Family/patient expects to be discharged to:: Group home                 Additional Comments: Reports group home is single-story with 3 steps, R rail to enter    Prior Function Level of Independence: Independent         Comments: reports no falls recently; AMB around yard, neigborhood     Hand Dominance        Extremity/Trunk Assessment   Upper Extremity Assessment Upper Extremity Assessment: Overall WFL for tasks assessed    Lower Extremity Assessment Lower Extremity Assessment: Overall WFL for tasks assessed       Communication   Communication:  (mild to moderate dysarthria, assume baseline)  Cognition Arousal/Alertness: Awake/alert Behavior During Therapy:  WFL for tasks assessed/performed;Impulsive Overall Cognitive Status: Within Functional Limits for tasks assessed                                        General Comments      Exercises     Assessment/Plan    PT Assessment [place] does not need any further PT services  PT Problem List         PT Treatment Interventions      PT Goals (Current goals can be found in the Care Plan section)  Acute Rehab PT Goals PT Goal Formulation: All assessment and education complete, DC  therapy    Frequency     Barriers to discharge        Co-evaluation               AM-PAC PT "6 Clicks" Mobility  Outcome Measure Help needed turning from your back to your side while in a flat bed without using bedrails?: None Help needed moving from lying on your back to sitting on the side of a flat bed without using bedrails?: None Help needed moving to and from a bed to a chair (including a wheelchair)?: None Help needed standing up from a chair using your arms (e.g., wheelchair or bedside chair)?: None Help needed to walk in hospital room?: None Help needed climbing 3-5 steps with a railing? : None 6 Click Score: 24    End of Session Equipment Utilized During Treatment: Gait belt Activity Tolerance: Patient tolerated treatment well;Treatment limited secondary to medical complications (Comment) (DOE) Patient left:  (standing in hallway per his preference, desires to mobilize a bit)   PT Visit Diagnosis: Difficulty in walking, not elsewhere classified (R26.2)    Time: 4503-8882 PT Time Calculation (min) (ACUTE ONLY): 10 min   Charges:   PT Evaluation $PT Eval Low Complexity: 1 Low          9:53 AM, 03/11/20 Etta Grandchild, PT, DPT Physical Therapist - [facility]  [phone] ([place])   Legacy Lacivita C 03/11/2020, 9:50 AM

## 2020-03-11 NOTE — NC FL2 (Signed)
[place] LEVEL OF CARE SCREENING TOOL     IDENTIFICATION  Patient Name: Carlos Nelson Birthdate: [birth year] Sex: male Admission Date (Current Location): 03/10/2020  [place] and [place] Number:  Engineering geologist and Address:  [facility], [address] [place],  [address]      Provider Number: [id number]  Attending Physician Name and Address:  Fritzi Mandes, MD  Relative Name and Phone Number:  [facility], [phone]    Current Level of Care: Other (Comment) (Group Home) Recommended Level of Care: Other (Comment) Prior Approval Number:    Date Approved/Denied:   PASRR Number:    Discharge Plan: Other (Comment)    Current Diagnoses: Patient Active Problem List   Diagnosis Date Noted  . COPD with acute exacerbation ([place]) 03/10/2020  . Right-sided chest pain 03/10/2020  . HLD (hyperlipidemia) 01/18/2020  . CAD (coronary artery disease) 01/18/2020  . Hypertensive urgency 01/18/2020  . Tobacco abuse 01/18/2020  . Scrotum pain   . Elevated troponin   . Demand ischemia ([place]) 06/11/2019  . Malnutrition of moderate degree 06/10/2019  . NSTEMI (non-ST elevated myocardial infarction) ([place]) 06/09/2019  . Depression with anxiety 06/09/2019  . Pleuritic chest pain 05/20/2019  . CAP (community acquired pneumonia) 05/20/2019  . Hypertension   . COPD exacerbation ([place])   . Acute respiratory failure with hypoxia ([place])   . Altered mental status 09/25/2018  . Schizophrenia ([place])   . Tachycardia 08/21/2018  . Schizophrenia, chronic with acute exacerbation ([place]) 08/17/2018  . Elevated PSA 05/24/2015    Orientation RESPIRATION BLADDER Height & Weight     Self,Time,Place,Situation  Normal Continent Weight: 56.2 kg Height:  5\' 4"  (162.6 cm)  BEHAVIORAL SYMPTOMS/MOOD NEUROLOGICAL BOWEL NUTRITION STATUS      Continent Diet  AMBULATORY STATUS COMMUNICATION OF NEEDS Skin   Independent Verbally Normal                        Personal Care Assistance Level of Assistance  Bathing,Feeding,Dressing Bathing Assistance: Independent Feeding assistance: Independent Dressing Assistance: Independent     Functional Limitations Info  Sight,Hearing,Speech Sight Info: Adequate Hearing Info: Adequate Speech Info: Adequate    SPECIAL CARE FACTORS FREQUENCY  Blood pressure Blood Pressure Frequency: Per Discharge orders                  Contractures Contractures Info: Not present    Additional Factors Info  Code Status,Allergies Code Status Info: Full Allergies Info: Ace Inhibitors           Current Medications (03/11/2020):  This is the current hospital active medication list Current Facility-Administered Medications  Medication Dose Route Frequency Provider Last Rate Last Admin  . albuterol (PROVENTIL) (2.5 MG/3ML) 0.083% nebulizer solution 2.5 mg  2.5 mg Nebulization Q2H PRN Athena Masse, MD      . amLODipine (NORVASC) tablet 5 mg  5 mg Oral Daily Dorothe Pea, RPH   5 mg at 03/11/20 7353   And  . irbesartan (AVAPRO) tablet 150 mg  150 mg Oral Daily Dorothe Pea, RPH   150 mg at 03/11/20 0831  . ARIPiprazole (ABILIFY) tablet 15 mg  15 mg Oral Daily Fritzi Mandes, MD   15 mg at 03/11/20 0830  . donepezil (ARICEPT) tablet 5 mg  5 mg Oral QHS Fritzi Mandes, MD   5 mg at 03/10/20 2113  . enoxaparin (LOVENOX) injection 40 mg  40 mg Subcutaneous Q24H Athena Masse, MD  40 mg at 03/11/20 0831  . ipratropium-albuterol (DUONEB) 0.5-2.5 (3) MG/3ML nebulizer solution 3 mL  3 mL Nebulization Q6H Athena Masse, MD   3 mL at 03/11/20 0826  . labetalol (NORMODYNE) injection 10 mg  10 mg Intravenous Q2H PRN Sharion Settler, NP   10 mg at 03/11/20 0538  . pantoprazole (PROTONIX) EC tablet 20 mg  20 mg Oral Daily Fritzi Mandes, MD   20 mg at 03/11/20 0831  . predniSONE (DELTASONE) tablet 40 mg  40 mg Oral Q breakfast Fritzi Mandes, MD   40 mg at 03/11/20 1208  . QUEtiapine (SEROQUEL) tablet 25 mg  25 mg Oral  Daily Fritzi Mandes, MD   25 mg at 03/11/20 9798  . QUEtiapine (SEROQUEL) tablet 600 mg  600 mg Oral QHS Fritzi Mandes, MD   600 mg at 03/10/20 2112  . vitamin B-12 (CYANOCOBALAMIN) tablet 1,000 mcg  1,000 mcg Oral Daily Fritzi Mandes, MD   1,000 mcg at 03/11/20 9211     Discharge Medications: Please see discharge summary for a list of discharge medications.  Relevant Imaging Results:  Relevant Lab Results:   Additional Information    Kerin Salen, RN

## 2020-03-11 NOTE — Care Management Obs Status (Signed)
[place] NOTIFICATION   Patient Details  Name: Carlos Nelson MRN: [id number] Date of Birth: September 14, 1950   Medicare Observation Status Notification Given:  Yes    Kerin Salen, RN 03/11/2020, 2:24 PM

## 2020-03-11 NOTE — Care Management CC44 (Signed)
Condition Code 44 Documentation Completed  Patient Details  Name: Carlos Nelson MRN: [id number] Date of Birth: April 30, 1950   Condition Code 44 given:  Yes Patient signature on Condition Code 44 notice:  Yes Documentation of 2 MD's agreement:  Yes Code 44 added to claim:  Yes    Kerin Salen, RN 03/11/2020, 2:24 PM

## 2020-03-11 NOTE — Progress Notes (Signed)
Initial Nutrition Assessment  DOCUMENTATION CODES:   Severe malnutrition in context of chronic illness  INTERVENTION:   Ensure Enlive po BID, each supplement provides 350 kcal and 20 grams of protein  MVI po daily   NUTRITION DIAGNOSIS:   Severe Malnutrition related to chronic illness (COPD) as evidenced by moderate fat depletion,severe fat depletion,severe muscle depletion.  GOAL:   Patient will meet greater than or equal to 90% of their needs  MONITOR:   PO intake,Supplement acceptance,Labs,Weight trends,Skin,I & O's  REASON FOR ASSESSMENT:   Consult Assessment of nutrition requirement/status  ASSESSMENT:   70 y.o. male with medical history significant for HTN, COPD, schizophrenia, hospitalized from 1/3 to 01/19/2020 with acute respiratory failure secondary to COPD, sent from group home with worsening shortness of breath not responding to his home inhalers.   Met with pt in room today. Pt reports good appetite and oral intake pta and in hospital. Pt documented to be eating 100% of meals in hospital. Pt reports that he does drink chocolate and vanilla Ensure and that he would like to have this in hospital. Per chart, pt is weight stable pta. Pt to discharge home today. RD will add supplements and MVI if pt does not discharge.   Medications reviewed and include: lovenox, protonix, prednisone, B12  Labs reviewed: K 4.7 wnl  NUTRITION - FOCUSED PHYSICAL EXAM:  Flowsheet Row Most Recent Value  Orbital Region Moderate depletion  Upper Arm Region Severe depletion  Thoracic and Lumbar Region Moderate depletion  Buccal Region Moderate depletion  Temple Region Severe depletion  Clavicle Bone Region Severe depletion  Clavicle and Acromion Bone Region Severe depletion  Scapular Bone Region Severe depletion  Dorsal Hand Severe depletion  Patellar Region Severe depletion  Anterior Thigh Region Severe depletion  Posterior Calf Region Severe depletion  Edema (RD Assessment) None   Hair Reviewed  Eyes Reviewed  Mouth Reviewed  Skin Reviewed  Nails Reviewed     Diet Order:   Diet Order            Diet - low sodium heart healthy           Diet regular Room service appropriate? Yes; Fluid consistency: Thin  Diet effective now                EDUCATION NEEDS:   Education needs have been addressed  Skin:  Skin Assessment: Reviewed RN Assessment  Last BM:  2/24  Height:   Ht Readings from Last 1 Encounters:  03/10/20 5' 4"  (1.626 m)    Weight:   Wt Readings from Last 1 Encounters:  03/11/20 56.2 kg    Ideal Body Weight:  59 kg  BMI:  Body mass index is 21.28 kg/m.  Estimated Nutritional Needs:   Kcal:  1700-1900kcal/day  Protein:  85-95g/day  Fluid:  1.4-1.7L/day  Koleen Distance MS, RD, LDN Please refer to [facility] for RD and/or RD on-call/weekend/after hours pager

## 2020-03-11 NOTE — Discharge Summary (Addendum)
[facility] [place] NAME: Carlos Nelson    MR#:  [id number]  DATE OF BIRTH:  1950-07-10  DATE OF ADMISSION:  03/10/2020 ADMITTING PHYSICIAN: Athena Masse, MD  DATE OF DISCHARGE: 03/11/2020  PRIMARY CARE PHYSICIAN: [facility]    ADMISSION DIAGNOSIS:  Shortness of breath [R06.02] Chest tightness [R07.89] COPD exacerbation (HCC) [J44.1] COPD with acute exacerbation (HCC) [J44.1] Acute on chronic respiratory failure with hypoxia (HCC) [J96.21]  DISCHARGE DIAGNOSIS:  acute hypoxic respiratory failure secondary to COPD exacerbation hypoxia resolved ongoing tobacco abuse SECONDARY DIAGNOSIS:   Past Medical History:  Diagnosis Date   Chronic respiratory failure with hypoxia (HCC)    COPD (chronic obstructive pulmonary disease) (HCC)    Hypertension    Schizophrenia ([place])     HOSPITAL COURSE:   70 year old male with history of HTN, COPD, schizophrenia, hospitalized from 1/3 to 01/19/2020 with acute respiratory failure secondary to COPD, sent from group home with worsening shortness of breath not responding to home bronchodilators.  COPD with acute exacerbation ([place]) Acute respiratory failure with hypoxia ([place]) -Patient presents with wheezing typical of COPD exacerbations with increased work of breathing  -COVID flu test negative. Chest x-ray no acute findings other than emphysema -Scheduled and as needed nebulized bronchodilator therapy -IV steroids-- change to oral steroid -Flutter valve, mucolytic's -- no indications for antibiotics discontinued Rocephin -- continue inhalers and nebulizer at home -- advised smoking cessation  Hypertensive urgency -Systolic BP in the 315Q ER -Hydralazine IV as needed for now -Continue home amlodipine/olmesartan  Right-sided chest pain -Atypical and likely related to increased work of breathing. EKG nonacute -Patient had low risk stress test and unremarkable  echo May 2021  Schizophrenia [facility]) -Continue home meds   Given multiple comorbidities and high mortality index outpatient palliative care consultation has been ordered.  Overall hemodynamically stable. Sats 98% on room air. Patient feels better. Will discharge to home   DVT prophylaxis:Lovenox  Code Status:full code Family Communication:none Disposition Plan:Back to previous home environment Consults called:none Status:At the time of admission, it appears that the appropriate admission status for this patient is INPATIENT     CONSULTS OBTAINED:    DRUG ALLERGIES:   Allergies  Allergen Reactions   Ace Inhibitors Swelling    Ok with benazapril, per grouphome    DISCHARGE MEDICATIONS:   Allergies as of 03/11/2020      Reactions   Ace Inhibitors Swelling   Ok with benazapril, per grouphome      Medication List    STOP taking these medications   sildenafil 20 MG tablet Commonly known as: REVATIO     TAKE these medications   albuterol 108 (90 Base) MCG/ACT inhaler Commonly known as: VENTOLIN HFA Inhale 2 puffs into the lungs every 4 (four) hours as needed for wheezing or shortness of breath.   amLODipine-olmesartan 5-20 MG tablet Commonly known as: AZOR Take 1 tablet by mouth daily.   ARIPiprazole 15 MG tablet Commonly known as: ABILIFY Take 15 mg by mouth daily.   donepezil 5 MG tablet Commonly known as: ARICEPT Take 5 mg by mouth at bedtime.   hydrOXYzine 50 MG tablet Commonly known as: ATARAX/VISTARIL Take 50 mg by mouth every 6 (six) hours as needed for anxiety or itching.   ipratropium-albuterol 0.5-2.5 (3) MG/3ML Soln Commonly known as: DUONEB Take 3 mLs by nebulization every 4 (four) hours for 14 days.   pantoprazole 20 MG tablet Commonly known as: Protonix Take 1 tablet (20 mg  total) by mouth daily.   predniSONE 20 MG tablet Commonly known as: DELTASONE Take 2 tablets (40 mg total) by mouth daily with breakfast for 4 days.    QUEtiapine 100 MG tablet Commonly known as: SEROQUEL Take 100 mg by mouth daily.   QUEtiapine 200 MG tablet Commonly known as: SEROquel Take 3 tablets (600 mg total) by mouth at bedtime.   QUEtiapine 25 MG tablet Commonly known as: SEROQUEL Take 25 mg by mouth daily.   traZODone 100 MG tablet Commonly known as: DESYREL Take 100 mg by mouth at bedtime.   vitamin B-12 1000 MCG tablet Commonly known as: CYANOCOBALAMIN Take 1,000 mcg by mouth daily.       If you experience worsening of your admission symptoms, develop shortness of breath, life threatening emergency, suicidal or homicidal thoughts you must seek medical attention immediately by calling 911 or calling your MD immediately  if symptoms less severe.  You Must read complete instructions/literature along with all the possible adverse reactions/side effects for all the Medicines you take and that have been prescribed to you. Take any new Medicines after you have completely understood and accept all the possible adverse reactions/side effects.   Please note  You were cared for by a hospitalist during your hospital stay. If you have any questions about your discharge medications or the care you received while you were in the hospital after you are discharged, you can call the unit and asked to speak with the hospitalist on call if the hospitalist that took care of you is not available. Once you are discharged, your primary care physician will handle any further medical issues. Please note that NO REFILLS for any discharge medications will be authorized once you are discharged, as it is imperative that you return to your primary care physician (or establish a relationship with a primary care physician if you do not have one) for your aftercare needs so that they can reassess your need for medications and monitor your lab values. Today   SUBJECTIVE   No new complaints. Breathing comfortably. No wheezing  VITAL SIGNS:  Blood  pressure (!) 151/90, pulse 96, temperature (!) 97.2 F (36.2 C), temperature source Oral, resp. rate 18, height 5\' 4"  (1.626 m), weight 56.2 kg, SpO2 97 %.  I/O:    Intake/Output Summary (Last 24 hours) at 03/11/2020 1311 Last data filed at 03/11/2020 1004 Gross per 24 hour  Intake 480 ml  Output 400 ml  Net 80 ml    PHYSICAL EXAMINATION:  GENERAL:  70 y.o.-year-old patient lying in the bed with no acute distress.  LUNGS: distant breath sounds bilaterally, no wheezing, rales,rhonchi or crepitation. No use of accessory muscles of respiration.  CARDIOVASCULAR: S1, S2 normal. No murmurs, rubs, or gallops.  ABDOMEN: Soft, non-tender, non-distended. Bowel sounds present. No organomegaly or mass.  EXTREMITIES: No pedal edema, cyanosis, or clubbing.  NEUROLOGIC: Cranial nerves II through XII are intact. Muscle strength 5/5 in all extremities. Sensation intact. Gait not checked.  PSYCHIATRIC: The patient is alert and oriented x 3.  SKIN: No obvious rash, lesion, or ulcer.   DATA REVIEW:   CBC  Recent Labs  Lab 03/10/20 0251  WBC 7.8  HGB 16.1  HCT 49.7  PLT 235    Chemistries  Recent Labs  Lab 03/10/20 0251  NA 137  K 4.7  CL 101  CO2 29  GLUCOSE 85  BUN 19  CREATININE 1.17  CALCIUM 9.3    Microbiology Results   Recent Results (  from the past 240 hour(s))  Resp Panel by RT-PCR (Flu A&B, Covid) Nasopharyngeal Swab     Status: None   Collection Time: 03/10/20  2:51 AM   Specimen: Nasopharyngeal Swab; Nasopharyngeal(NP) swabs in vial transport medium  Result Value Ref Range Status   SARS Coronavirus 2 by RT PCR NEGATIVE NEGATIVE Final    Comment: (NOTE) SARS-CoV-2 target nucleic acids are NOT DETECTED.  The SARS-CoV-2 RNA is generally detectable in upper respiratory specimens during the acute phase of infection. The lowest concentration of SARS-CoV-2 viral copies this assay can detect is 138 copies/mL. A negative result does not preclude SARS-Cov-2 infection and  should not be used as the sole basis for treatment or other patient management decisions. A negative result may occur with  improper specimen collection/handling, submission of specimen other than nasopharyngeal swab, presence of viral mutation(s) within the areas targeted by this assay, and inadequate number of viral copies(<138 copies/mL). A negative result must be combined with clinical observations, patient history, and epidemiological information. The expected result is Negative.  Fact Sheet for Patients:  EntrepreneurPulse.com.au  Fact Sheet for Healthcare Providers:  IncredibleEmployment.be  This test is no t yet approved or cleared by the [place] FDA and  has been authorized for detection and/or diagnosis of SARS-CoV-2 by FDA under an Emergency Use Authorization (EUA). This EUA will remain  in effect (meaning this test can be used) for the duration of the COVID-19 declaration under Section 564(b)(1) of the Act, 21 U.S.C.section 360bbb-3(b)(1), unless the authorization is terminated  or revoked sooner.       Influenza A by PCR NEGATIVE NEGATIVE Final   Influenza B by PCR NEGATIVE NEGATIVE Final    Comment: (NOTE) The Xpert Xpress SARS-CoV-2/FLU/RSV plus assay is intended as an aid in the diagnosis of influenza from Nasopharyngeal swab specimens and should not be used as a sole basis for treatment. Nasal washings and aspirates are unacceptable for Xpert Xpress SARS-CoV-2/FLU/RSV testing.  Fact Sheet for Patients: EntrepreneurPulse.com.au  Fact Sheet for Healthcare Providers: IncredibleEmployment.be  This test is not yet approved or cleared by the [place] FDA and has been authorized for detection and/or diagnosis of SARS-CoV-2 by FDA under an Emergency Use Authorization (EUA). This EUA will remain in effect (meaning this test can be used) for the duration of the COVID-19 declaration  under Section 564(b)(1) of the Act, 21 U.S.C. section 360bbb-3(b)(1), unless the authorization is terminated or revoked.  Performed at [facility], [address] [place], [place] [address]     RADIOLOGY:  [facility] Chest Port 1 View  Result Date: 03/10/2020 CLINICAL DATA:  Shortness of breath, history of COPD EXAM: PORTABLE CHEST 1 VIEW COMPARISON:  Radiograph 03/01/2020 FINDINGS: Chronic hyperinflation and emphysematous changes throughout the lungs. No new consolidative opacity is seen. No convincing features of edema. Stable cardiomediastinal contours. No acute osseous or soft tissue abnormality. Degenerative changes are present in the imaged spine and shoulders. Telemetry leads overlie the chest. IMPRESSION: Chronic hyperinflation and emphysematous changes. No acute cardiopulmonary abnormality. Electronically Signed   By: Lovena Le M.D.   On: 03/10/2020 03:06     CODE STATUS:     Code Status Orders  (From admission, onward)         Start     Ordered   03/10/20 0435  Full code  Continuous        03/10/20 0438        Code Status History    Date Active Date Inactive Code Status  Order ID Comments User Context   01/19/2020 0351 01/19/2020 2131 Full Code [id number]  Ivor Costa, MD ED   06/09/2019 1009 06/12/2019 2156 Full Code [id number]  Ivor Costa, MD ED   05/24/2019 0018 05/25/2019 1827 Full Code [id number]  Jenkins Rouge, MD ED   05/20/2019 0827 05/22/2019 2049 Full Code [id number]  Ivor Costa, MD ED   08/21/2018 0446 08/22/2018 2048 Full Code [id number]  Harrie Foreman, MD Inpatient   Advance Care Planning Activity       TOTAL TIME TAKING CARE OF THIS PATIENT: *35* minutes.    Fritzi Mandes M.D  [facility]    CC: Primary care physician; [facility]

## 2020-03-16 ENCOUNTER — Other Ambulatory Visit: Payer: Self-pay

## 2020-03-16 ENCOUNTER — Inpatient Hospital Stay
Admission: EM | Admit: 2020-03-16 | Discharge: 2020-03-18 | DRG: 190 | Disposition: A | Discharge status: Home / Self Care | Payer: Medicare Other | Source: Skilled Nursing Facility | Attending: Obstetrics and Gynecology | Admitting: Obstetrics and Gynecology

## 2020-03-16 ENCOUNTER — Emergency Department: Payer: Medicare Other

## 2020-03-16 ENCOUNTER — Telehealth: Payer: Self-pay

## 2020-03-16 DIAGNOSIS — J9622 Acute and chronic respiratory failure with hypercapnia: Secondary | ICD-10-CM

## 2020-03-16 DIAGNOSIS — R7401 Elevation of levels of liver transaminase levels: Secondary | ICD-10-CM | POA: Diagnosis present

## 2020-03-16 DIAGNOSIS — F17210 Nicotine dependence, cigarettes, uncomplicated: Secondary | ICD-10-CM | POA: Diagnosis present

## 2020-03-16 DIAGNOSIS — E43 Unspecified severe protein-calorie malnutrition: Secondary | ICD-10-CM | POA: Insufficient documentation

## 2020-03-16 DIAGNOSIS — Z79899 Other long term (current) drug therapy: Secondary | ICD-10-CM

## 2020-03-16 DIAGNOSIS — I2510 Atherosclerotic heart disease of native coronary artery without angina pectoris: Secondary | ICD-10-CM | POA: Diagnosis present

## 2020-03-16 DIAGNOSIS — Z20822 Contact with and (suspected) exposure to covid-19: Secondary | ICD-10-CM | POA: Diagnosis present

## 2020-03-16 DIAGNOSIS — F209 Schizophrenia, unspecified: Secondary | ICD-10-CM | POA: Diagnosis present

## 2020-03-16 DIAGNOSIS — I252 Old myocardial infarction: Secondary | ICD-10-CM | POA: Diagnosis not present

## 2020-03-16 DIAGNOSIS — J9600 Acute respiratory failure, unspecified whether with hypoxia or hypercapnia: Secondary | ICD-10-CM

## 2020-03-16 DIAGNOSIS — J9601 Acute respiratory failure with hypoxia: Secondary | ICD-10-CM

## 2020-03-16 DIAGNOSIS — E785 Hyperlipidemia, unspecified: Secondary | ICD-10-CM | POA: Diagnosis present

## 2020-03-16 DIAGNOSIS — R0902 Hypoxemia: Secondary | ICD-10-CM

## 2020-03-16 DIAGNOSIS — R0603 Acute respiratory distress: Secondary | ICD-10-CM

## 2020-03-16 DIAGNOSIS — Z6821 Body mass index (BMI) 21.0-21.9, adult: Secondary | ICD-10-CM

## 2020-03-16 DIAGNOSIS — Z888 Allergy status to other drugs, medicaments and biological substances status: Secondary | ICD-10-CM

## 2020-03-16 DIAGNOSIS — I10 Essential (primary) hypertension: Secondary | ICD-10-CM | POA: Diagnosis present

## 2020-03-16 DIAGNOSIS — J441 Chronic obstructive pulmonary disease with (acute) exacerbation: Secondary | ICD-10-CM | POA: Diagnosis present

## 2020-03-16 DIAGNOSIS — J9621 Acute and chronic respiratory failure with hypoxia: Secondary | ICD-10-CM

## 2020-03-16 DIAGNOSIS — Z720 Tobacco use: Secondary | ICD-10-CM | POA: Diagnosis present

## 2020-03-16 LAB — COMPREHENSIVE METABOLIC PANEL
ALT: 121 U/L — ABNORMAL HIGH (ref 0–44)
AST: 38 U/L (ref 15–41)
Albumin: 3.7 g/dL (ref 3.5–5.0)
Alkaline Phosphatase: 84 U/L (ref 38–126)
Anion gap: 7 (ref 5–15)
BUN: 21 mg/dL (ref 8–23)
CO2: 27 mmol/L (ref 22–32)
Calcium: 9.2 mg/dL (ref 8.9–10.3)
Chloride: 105 mmol/L (ref 98–111)
Creatinine, Ser: 1.02 mg/dL (ref 0.61–1.24)
GFR, Estimated: >60 mL/min (ref >60)
Glucose, Bld: 115 mg/dL — ABNORMAL HIGH (ref 70–99)
Potassium: 4.6 mmol/L (ref 3.5–5.1)
Sodium: 139 mmol/L (ref 135–145)
Total Bilirubin: 0.8 mg/dL (ref 0.3–1.2)
Total Protein: 6.4 g/dL — ABNORMAL LOW (ref 6.5–8.1)

## 2020-03-16 LAB — CBC WITH DIFFERENTIAL/PLATELET
Abs Immature Granulocytes: 0.04 10*3/uL (ref 0.00–0.07)
Basophils Absolute: 0 10*3/uL (ref 0.0–0.1)
Basophils Relative: 0 %
Eosinophils Absolute: 0.1 10*3/uL (ref 0.0–0.5)
Eosinophils Relative: 1 %
HCT: 45.8 % (ref 39.0–52.0)
Hemoglobin: 14.8 g/dL (ref 13.0–17.0)
Immature Granulocytes: 0 %
Lymphocytes Relative: 20 %
Lymphs Abs: 1.9 10*3/uL (ref 0.7–4.0)
MCH: 29.4 pg (ref 26.0–34.0)
MCHC: 32.3 g/dL (ref 30.0–36.0)
MCV: 91.1 fL (ref 80.0–100.0)
Monocytes Absolute: 0.8 10*3/uL (ref 0.1–1.0)
Monocytes Relative: 9 %
Neutro Abs: 6.5 10*3/uL (ref 1.7–7.7)
Neutrophils Relative %: 70 %
Platelets: 223 10*3/uL (ref 150–400)
RBC: 5.03 MIL/uL (ref 4.22–5.81)
RDW: 13.3 % (ref 11.5–15.5)
WBC: 9.3 10*3/uL (ref 4.0–10.5)
nRBC: 0 % (ref 0.0–0.2)

## 2020-03-16 LAB — LACTIC ACID, PLASMA: Lactic Acid, Venous: 0.6 mmol/L (ref 0.5–1.9)

## 2020-03-16 LAB — BLOOD GAS, ARTERIAL
Acid-Base Excess: 2.9 mmol/L — ABNORMAL HIGH (ref 0.0–2.0)
Bicarbonate: 29 mmol/L — ABNORMAL HIGH (ref 20.0–28.0)
Delivery systems: POSITIVE
Expiratory PAP: 5
FIO2: 40
Inspiratory PAP: 10
O2 Saturation: 99.6 %
Patient temperature: 37
RATE: 16 resp/min
pCO2 arterial: 49 mmHg — ABNORMAL HIGH (ref 32.0–48.0)
pH, Arterial: 7.38 (ref 7.350–7.450)
pO2, Arterial: 180 mmHg — ABNORMAL HIGH (ref 83.0–108.0)

## 2020-03-16 LAB — PROCALCITONIN: Procalcitonin: <0.1 ng/mL

## 2020-03-16 LAB — RESP PANEL BY RT-PCR (FLU A&B, COVID) ARPGX2
Influenza A by PCR: NEGATIVE
Influenza B by PCR: NEGATIVE
SARS Coronavirus 2 by RT PCR: NEGATIVE

## 2020-03-16 LAB — HIV ANTIBODY (ROUTINE TESTING W REFLEX): HIV Screen 4th Generation wRfx: NONREACTIVE

## 2020-03-16 LAB — BRAIN NATRIURETIC PEPTIDE: B Natriuretic Peptide: 18.6 pg/mL (ref 0.0–100.0)

## 2020-03-16 LAB — TROPONIN I (HIGH SENSITIVITY)
Troponin I (High Sensitivity): 12 ng/L (ref <18)
Troponin I (High Sensitivity): 11 ng/L (ref <18)

## 2020-03-16 MED ORDER — TRAZODONE HCL 100 MG PO TABS
100.0000 mg | ORAL_TABLET | Freq: Every day | ORAL | Status: DC
  Administered 2020-03-16 – 2020-03-17 (×2): 100 mg via ORAL
  Filled 2020-03-16 (×2): qty 1

## 2020-03-16 MED ORDER — NICOTINE 14 MG/24HR TD PT24
14.0000 mg | MEDICATED_PATCH | Freq: Every day | TRANSDERMAL | Status: DC
  Filled 2020-03-16: qty 1

## 2020-03-16 MED ORDER — ALBUTEROL SULFATE (2.5 MG/3ML) 0.083% IN NEBU: 2.5000 mg | INHALATION_SOLUTION | RESPIRATORY_TRACT | Status: DC | PRN

## 2020-03-16 MED ORDER — ARIPIPRAZOLE 15 MG PO TABS
15.0000 mg | ORAL_TABLET | Freq: Every day | ORAL | Status: DC
  Administered 2020-03-16 – 2020-03-18 (×3): 15 mg via ORAL
  Filled 2020-03-16 (×3): qty 1

## 2020-03-16 MED ORDER — ENOXAPARIN SODIUM 40 MG/0.4ML ~~LOC~~ SOLN
40.0000 mg | SUBCUTANEOUS | Status: DC
  Administered 2020-03-16 – 2020-03-17 (×2): 40 mg via SUBCUTANEOUS
  Filled 2020-03-16 (×2): qty 0.4

## 2020-03-16 MED ORDER — DONEPEZIL HCL 5 MG PO TABS
5.0000 mg | ORAL_TABLET | Freq: Every day | ORAL | Status: DC
  Administered 2020-03-16 – 2020-03-17 (×2): 5 mg via ORAL
  Filled 2020-03-16 (×3): qty 1

## 2020-03-16 MED ORDER — IPRATROPIUM-ALBUTEROL 0.5-2.5 (3) MG/3ML IN SOLN
3.0000 mL | Freq: Four times a day (QID) | RESPIRATORY_TRACT | Status: DC
  Administered 2020-03-16 – 2020-03-18 (×8): 3 mL via RESPIRATORY_TRACT
  Filled 2020-03-16 (×7): qty 3

## 2020-03-16 MED ORDER — IRBESARTAN 150 MG PO TABS
150.0000 mg | ORAL_TABLET | Freq: Every day | ORAL | Status: DC
  Administered 2020-03-16 – 2020-03-18 (×3): 150 mg via ORAL
  Filled 2020-03-16 (×5): qty 1

## 2020-03-16 MED ORDER — SODIUM CHLORIDE 0.9% FLUSH
3.0000 mL | Freq: Two times a day (BID) | INTRAVENOUS | Status: DC
  Administered 2020-03-16 – 2020-03-18 (×5): 3 mL via INTRAVENOUS

## 2020-03-16 MED ORDER — QUETIAPINE FUMARATE 25 MG PO TABS
100.0000 mg | ORAL_TABLET | Freq: Every day | ORAL | Status: DC
  Administered 2020-03-16 – 2020-03-18 (×3): 100 mg via ORAL
  Filled 2020-03-16 (×3): qty 4

## 2020-03-16 MED ORDER — PANTOPRAZOLE SODIUM 20 MG PO TBEC
20.0000 mg | DELAYED_RELEASE_TABLET | Freq: Every day | ORAL | Status: DC
  Administered 2020-03-16 – 2020-03-18 (×3): 20 mg via ORAL
  Filled 2020-03-16 (×3): qty 1

## 2020-03-16 MED ORDER — METHYLPREDNISOLONE SODIUM SUCC 40 MG IJ SOLR
40.0000 mg | Freq: Two times a day (BID) | INTRAMUSCULAR | Status: DC
  Administered 2020-03-16 – 2020-03-17 (×2): 40 mg via INTRAVENOUS
  Filled 2020-03-16 (×2): qty 1

## 2020-03-16 MED ORDER — HYDROXYZINE HCL 25 MG PO TABS
50.0000 mg | ORAL_TABLET | Freq: Four times a day (QID) | ORAL | Status: DC | PRN
  Administered 2020-03-17: 50 mg via ORAL
  Filled 2020-03-16: qty 2

## 2020-03-16 MED ORDER — QUETIAPINE FUMARATE 300 MG PO TABS
600.0000 mg | ORAL_TABLET | Freq: Every day | ORAL | Status: DC
  Administered 2020-03-16 – 2020-03-17 (×2): 600 mg via ORAL
  Filled 2020-03-16 (×3): qty 2

## 2020-03-16 MED ORDER — SODIUM CHLORIDE 0.9 % IV SOLN: 250.0000 mL | INTRAVENOUS | Status: DC | PRN

## 2020-03-16 MED ORDER — VITAMIN B-12 1000 MCG PO TABS
1000.0000 ug | ORAL_TABLET | Freq: Every day | ORAL | Status: DC
  Administered 2020-03-16 – 2020-03-18 (×3): 1000 ug via ORAL
  Filled 2020-03-16 (×3): qty 1

## 2020-03-16 MED ORDER — IPRATROPIUM-ALBUTEROL 0.5-2.5 (3) MG/3ML IN SOLN
3.0000 mL | Freq: Once | RESPIRATORY_TRACT | Status: AC
  Administered 2020-03-16: 3 mL via RESPIRATORY_TRACT
  Filled 2020-03-16: qty 3

## 2020-03-16 MED ORDER — AMLODIPINE-OLMESARTAN 5-20 MG PO TABS: 1.0000 | ORAL_TABLET | Freq: Every day | ORAL | Status: DC

## 2020-03-16 MED ORDER — BUDESONIDE 0.5 MG/2ML IN SUSP: 2.0000 mg | Freq: Two times a day (BID) | RESPIRATORY_TRACT | Status: DC

## 2020-03-16 MED ORDER — QUETIAPINE FUMARATE 25 MG PO TABS
25.0000 mg | ORAL_TABLET | Freq: Every day | ORAL | Status: DC
  Administered 2020-03-16 – 2020-03-18 (×3): 25 mg via ORAL
  Filled 2020-03-16 (×3): qty 1

## 2020-03-16 MED ORDER — SODIUM CHLORIDE 0.9% FLUSH: 3.0000 mL | INTRAVENOUS | Status: DC | PRN

## 2020-03-16 MED ORDER — SODIUM CHLORIDE 0.9 % IV SOLN
1.0000 g | INTRAVENOUS | Status: DC
  Administered 2020-03-16: 1 g via INTRAVENOUS
  Filled 2020-03-16: qty 10

## 2020-03-16 MED ORDER — AMLODIPINE BESYLATE 5 MG PO TABS
5.0000 mg | ORAL_TABLET | Freq: Every day | ORAL | Status: DC
  Administered 2020-03-16 – 2020-03-18 (×3): 5 mg via ORAL
  Filled 2020-03-16 (×3): qty 1

## 2020-03-16 NOTE — Progress Notes (Signed)
[facility] ED05 Manufacturing engineer [facility]) Hospital Liaison RN note:  This patient is a pending palliative patient with [facility] outpatient based palliative care. Patient is from [place]. [place] Liaison will follow for disposition. Teresita Maxey, TOC notified via epic chat.   Thank you.  Zandra Abts, RN [facility] Liaison [phone]

## 2020-03-16 NOTE — ED Notes (Signed)
RT at bedside collecting ABG -- bedside  CXR completed-results pending

## 2020-03-16 NOTE — ED Notes (Signed)
Pt ate 100% of lunch tray

## 2020-03-16 NOTE — ED Triage Notes (Signed)
Pt arrives from [place] home after calling 911 himself; nurse reportedly sleep and wasn't answering patient calls for assistance; per ems nurse continued to sleep upon first responder and ems arrival and no report or paperwork obtained.  Pt with h/o COPD c/o sudden onset sob that began approx 30 min pta -- pt noted to be tripoding and O2 sats on RA initially 87%.  Pt subsequently placed on CPAP and was administered 125mg  solu-medrol to 20g lower L FA (placed enroute) along with duonebs x2 and single albuterol neb treatment with improvement to 95%.  All other VSS with exception of elevated HR (up to 111).

## 2020-03-16 NOTE — ED Notes (Signed)
Contacted pharmacy again about pt med Levy Sjogren)

## 2020-03-16 NOTE — ED Provider Notes (Signed)
[facility] Emergency Department Provider Note   ____________________________________________   Event Date/Time   First MD Initiated Contact with Patient 03/16/20 [phone]     (approximate)  I have reviewed the triage vital signs and the nursing notes.   HISTORY  Chief Complaint Respiratory distress   HPI Carlos Nelson is a 70 y.o. male brought to the ED via EMS from group home with a chief complaint of respiratory distress. Patient has a history of schizophrenia, COPD not on chronic oxygen who complains of shortness of breath and chest tightness x this evening. EMS reports room air saturations 89%. Given 125 mg IV Solu-Medrol, albuterol nebulizer, 2 duo nebs. Placed on CPAP for tripoding. Patient denies fever, chills, abdominal pain, nausea, vomiting or diarrhea     Past Medical History:  Diagnosis Date  . Chronic respiratory failure with hypoxia ([place])   . COPD (chronic obstructive pulmonary disease) ([place])   . Hypertension   . Schizophrenia [facility])     Patient Active Problem List   Diagnosis Date Noted  . COPD with acute exacerbation ([place]) 03/10/2020  . Right-sided chest pain 03/10/2020  . HLD (hyperlipidemia) 01/18/2020  . CAD (coronary artery disease) 01/18/2020  . Hypertensive urgency 01/18/2020  . Tobacco abuse 01/18/2020  . Scrotum pain   . Elevated troponin   . Demand ischemia ([place]) 06/11/2019  . Malnutrition of moderate degree 06/10/2019  . NSTEMI (non-ST elevated myocardial infarction) ([place]) 06/09/2019  . Depression with anxiety 06/09/2019  . Pleuritic chest pain 05/20/2019  . CAP (community acquired pneumonia) 05/20/2019  . Hypertension   . COPD exacerbation ([place])   . Acute respiratory failure with hypoxia ([place])   . Altered mental status 09/25/2018  . Schizophrenia ([place])   . Tachycardia 08/21/2018  . Schizophrenia, chronic with acute exacerbation ([place]) 08/17/2018  . Elevated PSA 05/24/2015    Past Surgical History:  Procedure  Laterality Date  . COLONOSCOPY    . COLONOSCOPY WITH PROPOFOL N/A 12/13/2016   Procedure: COLONOSCOPY WITH PROPOFOL;  Surgeon: Lollie Sails, MD;  Location: [facility] ENDOSCOPY;  Service: Endoscopy;  Laterality: N/A;  . ESOPHAGOGASTRODUODENOSCOPY (EGD) WITH PROPOFOL N/A 12/13/2016   Procedure: ESOPHAGOGASTRODUODENOSCOPY (EGD) WITH PROPOFOL;  Surgeon: Lollie Sails, MD;  Location: [facility] ENDOSCOPY;  Service: Endoscopy;  Laterality: N/A;  . left arm surgery      Prior to Admission medications   Medication Sig Start Date End Date Taking? Authorizing Provider  albuterol (VENTOLIN HFA) 108 (90 Base) MCG/ACT inhaler Inhale 2 puffs into the lungs every 4 (four) hours as needed for wheezing or shortness of breath. 03/11/20   Fritzi Mandes, MD  amLODipine-olmesartan (AZOR) 5-20 MG tablet Take 1 tablet by mouth daily.    [provider]  ARIPiprazole (ABILIFY) 15 MG tablet Take 15 mg by mouth daily. 01/29/20   [provider]  donepezil (ARICEPT) 5 MG tablet Take 5 mg by mouth at bedtime.    [provider]  hydrOXYzine (ATARAX/VISTARIL) 50 MG tablet Take 50 mg by mouth every 6 (six) hours as needed for anxiety or itching.    [provider]  ipratropium-albuterol (DUONEB) 0.5-2.5 (3) MG/3ML SOLN Take 3 mLs by nebulization every 4 (four) hours for 14 days. 03/11/20 03/25/20  Fritzi Mandes, MD  pantoprazole (PROTONIX) 20 MG tablet Take 1 tablet (20 mg total) by mouth daily. 12/18/19 12/17/20  Lavonia Drafts, MD  QUEtiapine (SEROQUEL) 100 MG tablet Take 100 mg by mouth daily.    [provider]  QUEtiapine (SEROQUEL) 200  MG tablet Take 3 tablets (600 mg total) by mouth at bedtime. 02/03/20   Harvest Dark, MD  QUEtiapine (SEROQUEL) 25 MG tablet Take 25 mg by mouth daily. 02/25/20   [provider]  traZODone (DESYREL) 100 MG tablet Take 100 mg by mouth at bedtime. 02/24/20   [provider]  vitamin B-12 (CYANOCOBALAMIN) 1000 MCG tablet Take 1,000  mcg by mouth daily.    [provider]    Allergies Ace inhibitors  Family History  Problem Relation Age of Onset  . Prostate cancer Neg Hx   . Bladder Cancer Neg Hx     Social History Social History   Tobacco Use  . Smoking status: Current Every Day Smoker    Packs/day: 1.00    Types: Cigarettes  . Smokeless tobacco: Never Used  Substance Use Topics  . Alcohol use: Yes    Alcohol/week: 0.0 standard drinks    Comment: beer  . Drug use: Not Currently    Types: Marijuana    Review of Systems  Constitutional: No fever/chills Eyes: No visual changes. ENT: No sore throat. Cardiovascular: Positive for chest pain. Respiratory: Positive for shortness of breath. Gastrointestinal: No abdominal pain.  No nausea, no vomiting.  No diarrhea.  No constipation. Genitourinary: Negative for dysuria. Musculoskeletal: Negative for back pain. Skin: Negative for rash. Neurological: Negative for headaches, focal weakness or numbness.   ____________________________________________   PHYSICAL EXAM:  VITAL SIGNS: ED Triage Vitals  Enc Vitals Group     BP      Pulse      Resp      Temp      Temp src      SpO2      Weight      Height      Head Circumference      Peak Flow      Pain Score      Pain Loc      Pain Edu?      Excl. in [place]?     Constitutional: Alert and oriented. Elderly appearing and in moderate acute distress. Eyes: Conjunctivae are normal. PERRL. EOMI. Head: Atraumatic. Nose: No congestion/rhinnorhea. Mouth/Throat: Mucous membranes are mildly dry.  Neck: No stridor.   Cardiovascular: Normal rate, regular rhythm. Grossly normal heart sounds.  Good peripheral circulation. Respiratory: Increased respiratory effort.  No retractions. Lungs tight with wheezing bilaterally. + Tripoding. Gastrointestinal: Soft and nontender. No distention. No abdominal bruits. No CVA tenderness. Musculoskeletal: No lower extremity tenderness nor edema.  No joint  effusions. Neurologic:  Normal speech and language. No gross focal neurologic deficits are appreciated.  Skin:  Skin is warm, dry and intact. No rash noted. Psychiatric: Mood and affect are normal. Speech and behavior are normal.  ____________________________________________   LABS (all labs ordered are listed, but only abnormal results are displayed)  Labs Reviewed  BLOOD GAS, ARTERIAL - Abnormal; Notable for the following components:      Result Value   pCO2 arterial 49 (*)    pO2, Arterial 180 (*)    Bicarbonate 29.0 (*)    Acid-Base Excess 2.9 (*)    Allens test (pass/fail) YEAST (*)    All other components within normal limits  RESP PANEL BY RT-PCR (FLU A&B, COVID) ARPGX2  CULTURE, BLOOD (ROUTINE X 2)  CULTURE, BLOOD (ROUTINE X 2)  CBC WITH DIFFERENTIAL/PLATELET  COMPREHENSIVE METABOLIC PANEL  BRAIN NATRIURETIC PEPTIDE  LACTIC ACID, PLASMA  LACTIC ACID, PLASMA  COMPREHENSIVE METABOLIC PANEL  PROCALCITONIN  TROPONIN I (HIGH  SENSITIVITY)   ____________________________________________  EKG  ED ECG REPORT I, Kourtni Stineman J, the attending physician, personally viewed and interpreted this ECG.   Date: 03/16/2020  EKG Time: 0506  Rate: 103  Rhythm: sinus tachycardia  Axis: RAD  Intervals:none  ST&T Change: Nonspecific  ____________________________________________  RADIOLOGY Cecilie Kicks J, personally viewed and evaluated these images (plain radiographs) as part of my medical decision making, as well as reviewing the written report by the radiologist.  ED MD interpretation: Bibasilar atelectasis versus infiltrate  Official radiology report(s): DG Chest Port 1 View  Result Date: 03/16/2020 CLINICAL DATA:  Shortness of breath. EXAM: PORTABLE CHEST 1 VIEW COMPARISON:  03/10/2020. 03/01/2020. 02/03/2020. CT 10/15/2019, 06/23/2019. FINDINGS: Mediastinum and hilar structures normal. Heart size normal. Low lung volumes with mild bibasilar atelectasis. Mild bibasilar  infiltrates cannot be excluded. No pleural effusion or pneumothorax. Interposition of the colon under the right hemidiaphragm again noted. No acute bony abnormality. IMPRESSION: Low lung volumes with mild bibasilar atelectasis. Mild bibasilar infiltrates cannot be excluded. Electronically Signed   By: Marcello Moores  Register   On: 03/16/2020 05:11    ____________________________________________   PROCEDURES  Procedure(s) performed (including Critical Care):  .1-3 Lead EKG Interpretation Performed by: Paulette Blanch, MD Authorized by: Paulette Blanch, MD      CRITICAL CARE Performed by: Paulette Blanch   Total critical care time: 30 minutes  Critical care time was exclusive of separately billable procedures and treating other patients.  Critical care was necessary to treat or prevent imminent or life-threatening deterioration.  Critical care was time spent personally by me on the following activities: development of treatment plan with patient and/or surrogate as well as nursing, discussions with consultants, evaluation of patient's response to treatment, examination of patient, obtaining history from patient or surrogate, ordering and performing treatments and interventions, ordering and review of laboratory studies, ordering and review of radiographic studies, pulse oximetry and re-evaluation of patient's condition.  ____________________________________________   INITIAL IMPRESSION / ASSESSMENT AND PLAN / ED COURSE  As part of my medical decision making, I reviewed the following data within the [facility] notes reviewed and incorporated, Labs reviewed, EKG interpreted, Old chart reviewed, Radiograph reviewed, Discussed with admitting physician and Notes from prior ED visits     70 year old male with COPD presenting in respiratory distress Differential includes, but is not limited to, viral syndrome, bronchitis including COPD exacerbation, pneumonia, reactive airway  disease including asthma, CHF including exacerbation with or without pulmonary/interstitial edema, pneumothorax, ACS, thoracic trauma, and pulmonary embolism.  Patient placed on BiPAP upon his arrival to the treatment room. Will administer 3rd DuoNeb (4th nebulizer treatment total), obtain basic lab work, chest x-ray. Anticipate hospitalization.  I personally reviewed patient's chart and note the following encounters for the month of February: 02/20/2020 ED visit for COPD exacerbation 02/21/2020 ED visit for COPD exacerbation 03/01/2020 ED visit for COPD exacerbation 2/24-2/25/2022 hospital admission for COPD exacerbation  Clinical Course as of 03/16/20 0613  Wed Mar 16, 2020  0535 Improving on BiPAP.  Have discussed case with hospitalist services for admission.  Blood cultures, lactic acid and procalcitonin were added for possible infiltrates on chest x-ray.  Suspect more likely atelectasis as patient is afebrile with normal WBC and does not give a history of cough or fever. [JS]    Clinical Course User Index [JS] Paulette Blanch, MD     ____________________________________________   FINAL CLINICAL IMPRESSION(S) / ED DIAGNOSES  Final diagnoses:  COPD with acute exacerbation ([place])  Respiratory distress  Hypoxia     ED Discharge Orders    None      *Please note:  Carlos Nelson was evaluated in Emergency Department on 03/16/2020 for the symptoms described in the history of present illness. He was evaluated in the context of the global COVID-19 pandemic, which necessitated consideration that the patient might be at risk for infection with the SARS-CoV-2 virus that causes COVID-19. Institutional protocols and algorithms that pertain to the evaluation of patients at risk for COVID-19 are in a state of rapid change based on information released by regulatory bodies including the CDC and federal and state organizations. These policies and algorithms were followed during the patient's care in  the ED.  Some ED evaluations and interventions may be delayed as a result of limited staffing during and the pandemic.*   Note:  This document was prepared using Dragon voice recognition software and may include unintentional dictation errors.   Paulette Blanch, MD 03/16/20 [phone]

## 2020-03-16 NOTE — Telephone Encounter (Signed)
Attempted to contact [facility] to schedule a Palliative Care consult appointment. No answer left a message to return call. Noticed patient was admitted to [facility] on 03/16/20.Will notify hospital liaison.

## 2020-03-16 NOTE — ED Notes (Signed)
Changed pt gown and pt bed sheets. Pt is now comfortable and resting.

## 2020-03-16 NOTE — Evaluation (Signed)
Physical Therapy Evaluation Patient Details Name: Carlos Nelson MRN: [id number] DOB: 1950-08-29 Today's Date: 03/16/2020   History of Present Illness  presented to ER secondary to progressive SOB, chest pain; admitted for management of acute respiratory failure secondary to COPD exacerbation.  Initially requiring BiPAP; now weaned to [facility].  Clinical Impression  Upon evaluation, patient alert and oriented; follows commands and demonstrates good effort with mobility tasks.  Slightly impulsive with movement transitions, but not unsafe and performed without LOB.  Bilat UE/LE strength and ROM grossly symmetrical and WFL; no focal weakness appreciated. Able to complete bed mobility indep; sit/stand, basic transfers and gait (250') without assist device, mod indep/indep.  Demonstrates reciprocal stepping with good step height/length; good trunk rotation, arm swing and overall gait mechanics.  Good cadence and gait speed; completes head turns, changes of direction without buckling, LOB or safety concern.  Sats >92% on 2L supplemental O2 throughout session. Appears to be at baseline level of functional ability; no acute PT needs identified.  Will plan to involve mobility specialist as appropriate to ensure continued mobility throughout remaining hospitalization.  Will complete initial order; please re-consult should needs change.    Follow Up Recommendations No PT follow up    Equipment Recommendations       Recommendations for Other Services       Precautions / Restrictions Precautions Precautions: None Restrictions Weight Bearing Restrictions: No      Mobility  Bed Mobility Overal bed mobility: Independent                  Transfers Overall transfer level: Independent Equipment used: None             General transfer comment: good strength/power to bilat LEs  Ambulation/Gait Ambulation/Gait assistance: Modified independent (Device/Increase time) Gait Distance (Feet): 250  Feet Assistive device: None       General Gait Details: reciprocal stepping with good step height/length; good trunk rotation, arm swing and overall gait mechanics.  Good cadence and gait speed; completes head turns, changes of direction without buckling, LOB or safety concern.  Stairs            Wheelchair Mobility    Modified Rankin (Stroke Patients Only)       Balance Overall balance assessment: Modified Independent Sitting-balance support: No upper extremity supported;Feet supported Sitting balance-Leahy Scale: Good     Standing balance support: No upper extremity supported Standing balance-Leahy Scale: Good                               Pertinent Vitals/Pain Pain Assessment: No/denies pain    Home Living Family/patient expects to be discharged to:: Group home                 Additional Comments: Reports group home is single-story with 3 steps, R rail to enter    Prior Function Level of Independence: Independent         Comments: reports no falls recently; AMB around yard, neigborhood.  No home O2.  Responsible for taking out trash at facility.     Hand Dominance        Extremity/Trunk Assessment   Upper Extremity Assessment Upper Extremity Assessment: Overall WFL for tasks assessed    Lower Extremity Assessment Lower Extremity Assessment: Overall WFL for tasks assessed (grossly 4+ to 5/5 throughout)       Communication   Communication:  (generally garbled and difficult to understand at  times)  Cognition Arousal/Alertness: Awake/alert Behavior During Therapy: WFL for tasks assessed/performed;Impulsive Overall Cognitive Status: Within Functional Limits for tasks assessed                                        General Comments      Exercises     Assessment/Plan    PT Assessment    PT Problem List         PT Treatment Interventions      PT Goals (Current goals can be found in the Care Plan  section)  Acute Rehab PT Goals Patient Stated Goal: to go home PT Goal Formulation: All assessment and education complete, DC therapy Time For Goal Achievement: 03/16/20 Potential to Achieve Goals: Good    Frequency     Barriers to discharge        Co-evaluation               AM-PAC PT "6 Clicks" Mobility  Outcome Measure Help needed turning from your back to your side while in a flat bed without using bedrails?: None Help needed moving from lying on your back to sitting on the side of a flat bed without using bedrails?: None Help needed moving to and from a bed to a chair (including a wheelchair)?: None Help needed standing up from a chair using your arms (e.g., wheelchair or bedside chair)?: None Help needed to walk in hospital room?: None Help needed climbing 3-5 steps with a railing? : None 6 Click Score: 24    End of Session   Activity Tolerance: Patient tolerated treatment well Patient left: in bed;with call bell/phone within reach Nurse Communication: Mobility status PT Visit Diagnosis: Difficulty in walking, not elsewhere classified (R26.2)    Time: 4562-5638 PT Time Calculation (min) (ACUTE ONLY): 10 min   Charges:   PT Evaluation $PT Eval Low Complexity: 1 Low          Crimson Beer H. Owens Shark, PT, DPT, NCS 03/16/20, 2:14 PM [phone]

## 2020-03-16 NOTE — ED Notes (Signed)
Delay in collecting RESP Panel -- no swabs available at this time; awaiting lab who will be sending supply

## 2020-03-16 NOTE — ED Notes (Signed)
Pt able to have full conversation with MD and RN while on BIPAP. No RR distress noted at this time. RR even and unlabored at rest. Orders to attempt to wean pt from BIPAP.

## 2020-03-16 NOTE — ED Notes (Signed)
Upon arrival pt switched from EMS CPAP to hospital BIPAP -- 14/5/40%.  Pt alert and oriented x4.  Placed on continuous cardiac and pulse ox monitoring post arrival.

## 2020-03-16 NOTE — ED Notes (Signed)
Called pharmacy in reference to patient's irbesartan dose; awaiting dose to be sent to ED.

## 2020-03-16 NOTE — ED Notes (Signed)
Pt ate 100% of his meal .

## 2020-03-16 NOTE — ED Notes (Addendum)
Report to Mimi, RN

## 2020-03-16 NOTE — Progress Notes (Signed)
OT Cancellation Note  Patient Details Name: Carlos Nelson MRN: [id number] DOB: 08-11-1950   Cancelled Treatment:    Reason Eval/Treat Not Completed: OT screened, no needs identified, will sign off  Upon speaking with pt and chart review, noted that pt is at his functional baseline with ADLs/ADL mobility. Will complete order at this time as no acute OT needs are identified. Please re-consult should needs change.  Gerrianne Scale, [place], OTR/L ascom [phone] 03/16/20, 3:49 PM

## 2020-03-16 NOTE — ED Notes (Signed)
Called pharmacy about pt med Carlos Nelson). Radonna Ricker, RN contacted pharmacy about med 3 times before this RN

## 2020-03-16 NOTE — ED Notes (Signed)
Pt given lunch

## 2020-03-16 NOTE — H&P (Addendum)
History and Physical    Carlos Nelson EXH:[id number] DOB: 1950/08/14 DOA: 03/16/2020  PCP: [facility]   Patient coming from: [place]  I have personally briefly reviewed patient's old medical records in [place]  Chief Complaint: Shortness of breath  HPI: Carlos Nelson is a 70 y.o. male with medical history significant for COPD, schizophrenia, hypertension and chronic respiratory failure to the ER by EMS for evaluation of shortness of breath and chest pain.  Shortness of breath was sudden onset and when EMS arrived patient was noted to be tripoding with room air pulse oximetry of about 87%.  Patient was placed on CPAP to reduce work of breathing and received Solu-Medrol 125 mg IV as well as 2 DuoNeb's with improvement in his pulse oximetry to 95%. Shortness of breath is associated with pleuritic chest pain and a cough productive of brown phlegm.   He denies having any fever, no chills, no abdominal pain, no nausea, no vomiting, no diarrhea, no nocturia, no dysuria, no hematuria, no headache, no blurred vision, no focal deficits, no diaphoresis, no palpitations. ABG 7.3 8/49/180/29/99.6  sodium 139, potassium 4.6, chloride 105, bicarb 27, glucose 115, BUN 21, creatinine 1.02, calcium 9.2, alkaline phosphatase 84, albumin 3.7, AST 38, ALT 129, total protein 6.4, total bilirubin 0.8, lactic acid 0.6, procalcitonin less than 0.10, white count 9.3, hemoglobin 14.8, hematocrit 45.8, MCV 91.1, RDW 13.3, platelet count 223 Respiratory viral panel is negative Chest x-ray reviewed by me shows low lung volumes with mild basilar atelectasis Twelve-lead EKG shows sinus tachycardia with ST changes    ED Course: Patient is a 70 year old African-American male who resides at a skilled nursing facility and who presents to the emergency room by EMS for evaluation of worsening shortness of breath and pleuritic chest pain.  Patient initially required BiPAP to reduce  work of breathing but has been weaned down to 4 L of oxygen via nasal cannula.  He received IV Solu-Medrol in the ER.  He will be admitted to the hospital for further evaluation and treatment.   Review of Systems: As per HPI otherwise all other systems reviewed and negative.    Past Medical History:  Diagnosis Date  . Chronic respiratory failure with hypoxia ([place])   . COPD (chronic obstructive pulmonary disease) ([place])   . Hypertension   . Schizophrenia [facility])     Past Surgical History:  Procedure Laterality Date  . COLONOSCOPY    . COLONOSCOPY WITH PROPOFOL N/A 12/13/2016   Procedure: COLONOSCOPY WITH PROPOFOL;  Surgeon: Lollie Sails, MD;  Location: [facility] ENDOSCOPY;  Service: Endoscopy;  Laterality: N/A;  . ESOPHAGOGASTRODUODENOSCOPY (EGD) WITH PROPOFOL N/A 12/13/2016   Procedure: ESOPHAGOGASTRODUODENOSCOPY (EGD) WITH PROPOFOL;  Surgeon: Lollie Sails, MD;  Location: [facility] ENDOSCOPY;  Service: Endoscopy;  Laterality: N/A;  . left arm surgery       reports that he has been smoking cigarettes. He has been smoking about 1.00 pack per day. He has never used smokeless tobacco. He reports current alcohol use. He reports previous drug use. Drug: Marijuana.  Allergies  Allergen Reactions  . Ace Inhibitors Swelling    Ok with benazapril, per grouphome    Family History  Problem Relation Age of Onset  . Prostate cancer Neg Hx   . Bladder Cancer Neg Hx       Prior to Admission medications   Medication Sig Start Date End Date Taking? Authorizing Provider  albuterol (VENTOLIN HFA) 108 (90 Base) MCG/ACT inhaler Inhale 2  puffs into the lungs every 4 (four) hours as needed for wheezing or shortness of breath. 03/11/20  Yes Fritzi Mandes, MD  amLODipine-olmesartan (AZOR) 5-20 MG tablet Take 1 tablet by mouth daily.   Yes [provider]  ARIPiprazole (ABILIFY) 15 MG tablet Take 15 mg by mouth daily. 01/29/20  Yes [provider]  donepezil (ARICEPT) 5 MG tablet Take 5  mg by mouth at bedtime.   Yes [provider]  hydrOXYzine (ATARAX/VISTARIL) 50 MG tablet Take 50 mg by mouth every 6 (six) hours as needed for anxiety or itching.   Yes [provider]  ipratropium-albuterol (DUONEB) 0.5-2.5 (3) MG/3ML SOLN Take 3 mLs by nebulization every 4 (four) hours for 14 days. 03/11/20 03/25/20 Yes Fritzi Mandes, MD  pantoprazole (PROTONIX) 20 MG tablet Take 1 tablet (20 mg total) by mouth daily. 12/18/19 12/17/20 Yes Lavonia Drafts, MD  QUEtiapine (SEROQUEL) 100 MG tablet Take 100 mg by mouth daily.   Yes [provider]  QUEtiapine (SEROQUEL) 200 MG tablet Take 3 tablets (600 mg total) by mouth at bedtime. 02/03/20  Yes Harvest Dark, MD  QUEtiapine (SEROQUEL) 25 MG tablet Take 25 mg by mouth daily. 02/25/20  Yes [provider]  traZODone (DESYREL) 100 MG tablet Take 100 mg by mouth at bedtime. 02/24/20  Yes [provider]  vitamin B-12 (CYANOCOBALAMIN) 1000 MCG tablet Take 1,000 mcg by mouth daily.   Yes [provider]    Physical Exam: Vitals:   03/16/20 0800 03/16/20 0830 03/16/20 0831 03/16/20 0930  BP: (!) 150/89 (!) 159/94  (!) 157/94  Pulse: 99 (!) 101 96 95  Resp: (!) 24  19 20   Temp:      TempSrc:      SpO2: 97% 96% 94% 95%     Vitals:   03/16/20 0800 03/16/20 0830 03/16/20 0831 03/16/20 0930  BP: (!) 150/89 (!) 159/94  (!) 157/94  Pulse: 99 (!) 101 96 95  Resp: (!) 24  19 20   Temp:      TempSrc:      SpO2: 97% 96% 94% 95%      Constitutional: Alert and oriented x 3 . Not in any apparent distress.  Appears comfortable on BiPAP and able to speak in full sentences HEENT:      Head: Normocephalic and atraumatic.         Eyes: PERLA, EOMI, Conjunctivae are normal. Sclera is non-icteric.       Mouth/Throat: Mucous membranes are moist.       Neck: Supple with no signs of meningismus. Cardiovascular: Regular rate and rhythm. No murmurs, gallops, or rubs. 2+ symmetrical distal pulses are present .  No JVD. No LE edema Respiratory:  Tachypneic, scattered wheezes, no crackles, or rhonchi.  Gastrointestinal: Soft, non tender, and non distended with positive bowel sounds.  Genitourinary: No CVA tenderness. Musculoskeletal: Nontender with normal range of motion in all extremities. No cyanosis, or erythema of extremities. Neurologic:  Face is symmetric. Moving all extremities. No gross focal neurologic deficits  Skin: Skin is warm, dry.  No rash or ulcers Psychiatric: Mood and affect are normal   Labs on Admission: I have personally reviewed following labs and imaging studies  CBC: Recent Labs  Lab 03/10/20 0251 03/16/20 0506  WBC 7.8 9.3  NEUTROABS 4.9 6.5  HGB 16.1 14.8  HCT 49.7 45.8  MCV 91.9 91.1  PLT 235 381   Basic Metabolic Panel: Recent Labs  Lab 03/10/20 0251 03/16/20 0506 03/16/20 0628  NA  137  --  139  K 4.7  --  4.6  CL 101  --  105  CO2 29  --  27  GLUCOSE 85 PENDING 115*  BUN 19  --  21  CREATININE 1.17  --  1.02  CALCIUM 9.3  --  9.2   GFR: Estimated Creatinine Clearance: 53.6 mL/min (by C-G formula based on SCr of 1.02 mg/dL). Liver Function Tests: Recent Labs  Lab 03/16/20 0628  AST 38  ALT 121*  ALKPHOS 84  BILITOT 0.8  PROT 6.4*  ALBUMIN 3.7   No results for input(s): LIPASE, AMYLASE in the last 168 hours. No results for input(s): AMMONIA in the last 168 hours. Coagulation Profile: No results for input(s): INR, PROTIME in the last 168 hours. Cardiac Enzymes: No results for input(s): CKTOTAL, CKMB, CKMBINDEX, TROPONINI in the last 168 hours. BNP (last 3 results) No results for input(s): PROBNP in the last 8760 hours. HbA1C: No results for input(s): HGBA1C in the last 72 hours. CBG: No results for input(s): GLUCAP in the last 168 hours. Lipid Profile: No results for input(s): CHOL, HDL, LDLCALC, TRIG, CHOLHDL, LDLDIRECT in the last 72 hours. Thyroid Function Tests: No results for input(s): TSH, T4TOTAL, FREET4, T3FREE, THYROIDAB in  the last 72 hours. Anemia Panel: No results for input(s): VITAMINB12, FOLATE, FERRITIN, TIBC, IRON, RETICCTPCT in the last 72 hours. Urine analysis:    Component Value Date/Time   COLORURINE YELLOW (A) 12/18/2019 0213   APPEARANCEUR CLEAR (A) 12/18/2019 0213   APPEARANCEUR Clear 09/04/2019 1429   LABSPEC 1.017 12/18/2019 0213   LABSPEC 1.014 11/09/2013 1525   PHURINE 6.0 12/18/2019 0213   GLUCOSEU NEGATIVE 12/18/2019 0213   GLUCOSEU Negative 11/09/2013 1525   HGBUR NEGATIVE 12/18/2019 0213   BILIRUBINUR NEGATIVE 12/18/2019 0213   BILIRUBINUR Negative 09/04/2019 1429   BILIRUBINUR Negative 11/09/2013 [employer] 12/18/2019 0213   PROTEINUR NEGATIVE 12/18/2019 0213   NITRITE NEGATIVE 12/18/2019 0213   LEUKOCYTESUR NEGATIVE 12/18/2019 0213   LEUKOCYTESUR Negative 11/09/2013 1525    Radiological Exams on Admission: DG Chest Port 1 View  Result Date: 03/16/2020 CLINICAL DATA:  Shortness of breath. EXAM: PORTABLE CHEST 1 VIEW COMPARISON:  03/10/2020. 03/01/2020. 02/03/2020. CT 10/15/2019, 06/23/2019. FINDINGS: Mediastinum and hilar structures normal. Heart size normal. Low lung volumes with mild bibasilar atelectasis. Mild bibasilar infiltrates cannot be excluded. No pleural effusion or pneumothorax. Interposition of the colon under the right hemidiaphragm again noted. No acute bony abnormality. IMPRESSION: Low lung volumes with mild bibasilar atelectasis. Mild bibasilar infiltrates cannot be excluded. Electronically Signed   By: Marcello Moores  Register   On: 03/16/2020 05:11     Assessment/Plan Principal Problem:   COPD with acute exacerbation ([place]) Active Problems:   Schizophrenia ([place])   Hypertension   Acute respiratory failure (H[facility])   Transaminitis   CAD (coronary artery disease)   Tobacco abuse     COPD with acute exacerbation Patient has a known history of COPD and continues to smoke cigarettes He presents to the ER for evaluation of sudden onset shortness of  breath associated with pleuritic chest pain and a cough productive of brown phlegm We will place patient on Rocephin 1 g IV daily for 5 days Continue as needed and scheduled bronchodilator therapy We will place patient on systemic steroids Continue oxygen supplementation to maintain pulse oximetry greater than 92% We will assess for home oxygen need prior to discharge   Acute respiratory failure Secondary to acute COPD exacerbation Patient presented to the ER  with increased work of breathing associated with use of accessory muscles and initially required BiPAP to reduce work of breathing He has been weaned down to 4 L of oxygen via nasal cannula He will need to be assessed for home oxygen need prior to discharge    Nicotine dependence Smoking cessation has been discussed with patient in detail We will place patient on a nicotine transdermal patch 14 mg daily    Schizophrenia Patient is on multiple antipsychotic agents which will be resumed during his hospitalization    Hypertension Continue amlodipine/olmesartan daily   Transaminitis Patient noted to have an elevated ALT level compared to normal levels a month ago Most likely medication induced    DVT prophylaxis: Lovenox Code Status: full code Family Communication: Greater than 50% of time was spent discussing patient's condition and plan of care with him at the bedside.  All questions and concerns have been addressed.  He verbalizes understanding and agrees with the plan. Disposition Plan: Back to previous home environment Consults called: none Status: Inpatient.  The medical decision making for this patient was of high complexity and patient is at high risk for clinical deterioration during this hospitalization.    Collier Bullock MD [facility]     03/16/2020, 11:17 AM

## 2020-03-16 NOTE — ED Notes (Signed)
Pt placed on 4L [place] and off of BIPAP. No distress noted. RR remain even and unlabored at rest. Pt continues to mumble to himself without any difficulties. Oxygen saturation remains 98-99%.

## 2020-03-16 NOTE — ED Notes (Addendum)
Unable to locate pt ordered irbesartan, messaged pharmacy requesting to send to ED

## 2020-03-17 ENCOUNTER — Encounter: Payer: Self-pay | Admitting: Obstetrics and Gynecology

## 2020-03-17 ENCOUNTER — Telehealth: Payer: Self-pay

## 2020-03-17 DIAGNOSIS — J441 Chronic obstructive pulmonary disease with (acute) exacerbation: Secondary | ICD-10-CM | POA: Diagnosis not present

## 2020-03-17 LAB — HEPATIC FUNCTION PANEL
ALT: 97 U/L — ABNORMAL HIGH (ref 0–44)
AST: 35 U/L (ref 15–41)
Albumin: 3.4 g/dL — ABNORMAL LOW (ref 3.5–5.0)
Alkaline Phosphatase: 82 U/L (ref 38–126)
Bilirubin, Direct: <0.1 mg/dL (ref 0.0–0.2)
Total Bilirubin: 0.5 mg/dL (ref 0.3–1.2)
Total Protein: 6 g/dL — ABNORMAL LOW (ref 6.5–8.1)

## 2020-03-17 LAB — CBC
HCT: 42.2 % (ref 39.0–52.0)
Hemoglobin: 13.9 g/dL (ref 13.0–17.0)
MCH: 30 pg (ref 26.0–34.0)
MCHC: 32.9 g/dL (ref 30.0–36.0)
MCV: 90.9 fL (ref 80.0–100.0)
Platelets: 198 10*3/uL (ref 150–400)
RBC: 4.64 MIL/uL (ref 4.22–5.81)
RDW: 13 % (ref 11.5–15.5)
WBC: 16.3 10*3/uL — ABNORMAL HIGH (ref 4.0–10.5)
nRBC: 0 % (ref 0.0–0.2)

## 2020-03-17 LAB — BASIC METABOLIC PANEL
Anion gap: 9 (ref 5–15)
BUN: 22 mg/dL (ref 8–23)
CO2: 24 mmol/L (ref 22–32)
Calcium: 9 mg/dL (ref 8.9–10.3)
Chloride: 104 mmol/L (ref 98–111)
Creatinine, Ser: 1.02 mg/dL (ref 0.61–1.24)
GFR, Estimated: >60 mL/min (ref >60)
Glucose, Bld: 169 mg/dL — ABNORMAL HIGH (ref 70–99)
Potassium: 4.3 mmol/L (ref 3.5–5.1)
Sodium: 137 mmol/L (ref 135–145)

## 2020-03-17 MED ORDER — ENSURE ENLIVE PO LIQD
237.0000 mL | Freq: Three times a day (TID) | ORAL | Status: DC
  Administered 2020-03-17 – 2020-03-18 (×2): 237 mL via ORAL

## 2020-03-17 MED ORDER — TRAZODONE HCL 50 MG PO TABS: 25.0000 mg | ORAL_TABLET | Freq: Every evening | ORAL | Status: DC | PRN

## 2020-03-17 MED ORDER — PREDNISONE 20 MG PO TABS
40.0000 mg | ORAL_TABLET | Freq: Every day | ORAL | Status: DC
  Administered 2020-03-18: 40 mg via ORAL
  Filled 2020-03-17: qty 2

## 2020-03-17 MED ORDER — ACETAMINOPHEN 325 MG PO TABS
650.0000 mg | ORAL_TABLET | Freq: Four times a day (QID) | ORAL | Status: DC | PRN
  Administered 2020-03-17 – 2020-03-18 (×3): 650 mg via ORAL
  Filled 2020-03-17 (×3): qty 2

## 2020-03-17 MED ORDER — AZITHROMYCIN 500 MG PO TABS
500.0000 mg | ORAL_TABLET | Freq: Every day | ORAL | Status: AC
  Administered 2020-03-17: 500 mg via ORAL
  Filled 2020-03-17: qty 1

## 2020-03-17 MED ORDER — DILTIAZEM HCL 25 MG/5ML IV SOLN
5.0000 mg | Freq: Once | INTRAVENOUS | Status: AC
  Administered 2020-03-17: 5 mg via INTRAVENOUS
  Filled 2020-03-17: qty 5

## 2020-03-17 MED ORDER — AZITHROMYCIN 250 MG PO TABS
250.0000 mg | ORAL_TABLET | Freq: Every day | ORAL | Status: DC
  Administered 2020-03-18: 250 mg via ORAL
  Filled 2020-03-17: qty 1

## 2020-03-17 MED ORDER — ADULT MULTIVITAMIN W/MINERALS CH
1.0000 | ORAL_TABLET | Freq: Every day | ORAL | Status: DC
  Administered 2020-03-18: 1 via ORAL
  Filled 2020-03-17: qty 1

## 2020-03-17 NOTE — ED Notes (Signed)
Found patient at toilet with vomit on floor and toilet seat. Gave emesis bag and notified nurse.

## 2020-03-17 NOTE — Progress Notes (Signed)
PROGRESS NOTE    Carlos Nelson  YWV:[id number] DOB: 1950-08-04 DOA: 03/16/2020 PCP: [facility]  Outpatient Specialists: none    Brief Narrative:   Carlos Nelson is a 70 y.o. male with medical history significant for COPD, schizophrenia, hypertension and chronic respiratory failure to the ER by EMS for evaluation of shortness of breath and chest pain.  Shortness of breath was sudden onset and when EMS arrived patient was noted to be tripoding with room air pulse oximetry of about 87%.  Patient was placed on CPAP to reduce work of breathing and received Solu-Medrol 125 mg IV as well as 2 DuoNeb's with improvement in his pulse oximetry to 95%. Shortness of breath is associated with pleuritic chest pain and a cough productive of brown phlegm.   He denies having any fever, no chills, no abdominal pain, no nausea, no vomiting, no diarrhea, no nocturia, no dysuria, no hematuria, no headache, no blurred vision, no focal deficits, no diaphoresis, no palpitations.  ED Course: Patient is a 70 year old African-American male who resides at a skilled nursing facility and who presents to the emergency room by EMS for evaluation of worsening shortness of breath and pleuritic chest pain.  Patient initially required BiPAP to reduce work of breathing but has been weaned down to 4 L of oxygen via nasal cannula.  He received IV Solu-Medrol in the ER.  He will be admitted to the hospital for further evaluation and treatment.  Assessment & Plan:   Principal Problem:   COPD with acute exacerbation ([place]) Active Problems:   Schizophrenia ([place])   Hypertension   Acute respiratory failure (HCC)   Transaminitis   CAD (coronary artery disease)   Tobacco abuse  # COPD with acute exacerbation # Acute hypoxic respiratory failure Patient has a known history of COPD and continues to smoke cigarettes He presents to the ER for evaluation of sudden onset shortness of breath associated with  pleuritic chest pain and a cough productive of brown phlegm. Initially required bipap, now weaned to 2 L and breathing comfortably. CXR w/ likely bibasilar atelectasis. Trop and bnp neg.  - stop ceftriaxone, start azithromycin - continue steroids with pred 40 daily - cont scheduled duonebs, prn albuterol - continue O2, wean as able  # Nicotine dependence - declines patch  # Schizophrenia - cont home seroquel, abilify, trazodone, aricept  # Hypertension BP wnl - Continue amlodipine/olmesartan daily  # Transaminitis Patient noted to have an elevated ALT level compared to normal levels a month ago. asymptomatic - trend   DVT prophylaxis: lovenox Code Status: full Family Communication: none at bedside  Level of care: Med-Surg Status is: Inpatient  Remains inpatient appropriate because:Inpatient level of care appropriate due to severity of illness   Dispo: The patient is from: Group home              Anticipated d/c is to: Group home              Patient currently is not medically stable to d/c.   Difficult to place patient No        Consultants:  none  Procedures: none  Antimicrobials:  Ceftriaxone 3/2, azithromycin 3/3>    Subjective: This morning says breathing feeling improved. Still occasional cough. No chest pain  Objective: Vitals:   03/17/20 0422 03/17/20 0600 03/17/20 0730 03/17/20 0747  BP: 138/81 (!) 151/85  122/79  Pulse: (!) 104 (!) 105 92 100  Resp: 18 20  (!) 22  Temp:  TempSrc:      SpO2: 95% 95% 93% 95%  Weight:      Height:        Intake/Output Summary (Last 24 hours) at 03/17/2020 4166 Last data filed at 03/16/2020 1523 Gross per 24 hour  Intake -  Output 750 ml  Net -750 ml   Filed Weights   03/17/20 0410  Weight: 56 kg    Examination:  General exam: Appears calm and comfortable  Respiratory system: scattered exp wheeze Cardiovascular system: S1 & S2 heard, RRR. No JVD, murmurs, rubs, gallops or clicks. No pedal  edema. Gastrointestinal system: Abdomen is nondistended, soft and nontender. No organomegaly or masses felt. Normal bowel sounds heard. Central nervous system: Alert and oriented. No focal neurological deficits. Extremities: Symmetric 5 x 5 power. Skin: No rashes, lesions or ulcers Psychiatry: pleasant    Data Reviewed: I have personally reviewed following labs and imaging studies  CBC: Recent Labs  Lab 03/16/20 0506 03/17/20 0409  WBC 9.3 16.3*  NEUTROABS 6.5  --   HGB 14.8 13.9  HCT 45.8 42.2  MCV 91.1 90.9  PLT 223 063   Basic Metabolic Panel: Recent Labs  Lab 03/16/20 0506 03/16/20 0628 03/17/20 0409  NA  --  139 137  K  --  4.6 4.3  CL  --  105 104  CO2  --  27 24  GLUCOSE PENDING 115* 169*  BUN  --  21 22  CREATININE  --  1.02 1.02  CALCIUM  --  9.2 9.0   GFR: Estimated Creatinine Clearance: 53.4 mL/min (by C-G formula based on SCr of 1.02 mg/dL). Liver Function Tests: Recent Labs  Lab 03/16/20 0628  AST 38  ALT 121*  ALKPHOS 84  BILITOT 0.8  PROT 6.4*  ALBUMIN 3.7   No results for input(s): LIPASE, AMYLASE in the last 168 hours. No results for input(s): AMMONIA in the last 168 hours. Coagulation Profile: No results for input(s): INR, PROTIME in the last 168 hours. Cardiac Enzymes: No results for input(s): CKTOTAL, CKMB, CKMBINDEX, TROPONINI in the last 168 hours. BNP (last 3 results) No results for input(s): PROBNP in the last 8760 hours. HbA1C: No results for input(s): HGBA1C in the last 72 hours. CBG: No results for input(s): GLUCAP in the last 168 hours. Lipid Profile: No results for input(s): CHOL, HDL, LDLCALC, TRIG, CHOLHDL, LDLDIRECT in the last 72 hours. Thyroid Function Tests: No results for input(s): TSH, T4TOTAL, FREET4, T3FREE, THYROIDAB in the last 72 hours. Anemia Panel: No results for input(s): VITAMINB12, FOLATE, FERRITIN, TIBC, IRON, RETICCTPCT in the last 72 hours. Urine analysis:    Component Value Date/Time    COLORURINE YELLOW (A) 12/18/2019 0213   APPEARANCEUR CLEAR (A) 12/18/2019 0213   APPEARANCEUR Clear 09/04/2019 1429   LABSPEC 1.017 12/18/2019 0213   LABSPEC 1.014 11/09/2013 1525   PHURINE 6.0 12/18/2019 0213   GLUCOSEU NEGATIVE 12/18/2019 0213   GLUCOSEU Negative 11/09/2013 1525   HGBUR NEGATIVE 12/18/2019 0213   BILIRUBINUR NEGATIVE 12/18/2019 0213   BILIRUBINUR Negative 09/04/2019 1429   BILIRUBINUR Negative 11/09/2013 [place] 12/18/2019 0213   PROTEINUR NEGATIVE 12/18/2019 0213   NITRITE NEGATIVE 12/18/2019 0213   LEUKOCYTESUR NEGATIVE 12/18/2019 0213   LEUKOCYTESUR Negative 11/09/2013 1525   Sepsis Labs: @LABRCNTIP (procalcitonin:4,lacticidven:4)  ) Recent Results (from the past 240 hour(s))  Resp Panel by RT-PCR (Flu A&B, Covid) Nasopharyngeal Swab     Status: None   Collection Time: 03/10/20  2:51 AM   Specimen: Nasopharyngeal Swab;  Nasopharyngeal(NP) swabs in vial transport medium  Result Value Ref Range Status   SARS Coronavirus 2 by RT PCR NEGATIVE NEGATIVE Final    Comment: (NOTE) SARS-CoV-2 target nucleic acids are NOT DETECTED.  The SARS-CoV-2 RNA is generally detectable in upper respiratory specimens during the acute phase of infection. The lowest concentration of SARS-CoV-2 viral copies this assay can detect is 138 copies/mL. A negative result does not preclude SARS-Cov-2 infection and should not be used as the sole basis for treatment or other patient management decisions. A negative result may occur with  improper specimen collection/handling, submission of specimen other than nasopharyngeal swab, presence of viral mutation(s) within the areas targeted by this assay, and inadequate number of viral copies(<138 copies/mL). A negative result must be combined with clinical observations, patient history, and epidemiological information. The expected result is Negative.  Fact Sheet for Patients:   EntrepreneurPulse.com.au  Fact Sheet for Healthcare Providers:  IncredibleEmployment.be  This test is no t yet approved or cleared by the [place] FDA and  has been authorized for detection and/or diagnosis of SARS-CoV-2 by FDA under an Emergency Use Authorization (EUA). This EUA will remain  in effect (meaning this test can be used) for the duration of the COVID-19 declaration under Section 564(b)(1) of the Act, 21 U.S.C.section 360bbb-3(b)(1), unless the authorization is terminated  or revoked sooner.       Influenza A by PCR NEGATIVE NEGATIVE Final   Influenza B by PCR NEGATIVE NEGATIVE Final    Comment: (NOTE) The Xpert Xpress SARS-CoV-2/FLU/RSV plus assay is intended as an aid in the diagnosis of influenza from Nasopharyngeal swab specimens and should not be used as a sole basis for treatment. Nasal washings and aspirates are unacceptable for Xpert Xpress SARS-CoV-2/FLU/RSV testing.  Fact Sheet for Patients: EntrepreneurPulse.com.au  Fact Sheet for Healthcare Providers: IncredibleEmployment.be  This test is not yet approved or cleared by the [place] FDA and has been authorized for detection and/or diagnosis of SARS-CoV-2 by FDA under an Emergency Use Authorization (EUA). This EUA will remain in effect (meaning this test can be used) for the duration of the COVID-19 declaration under Section 564(b)(1) of the Act, 21 U.S.C. section 360bbb-3(b)(1), unless the authorization is terminated or revoked.  Performed at [facility], [address] [place], [place] [address]   Resp Panel by RT-PCR (Flu A&B, Covid) Nasopharyngeal Swab     Status: None   Collection Time: 03/16/20  5:06 AM   Specimen: Nasopharyngeal Swab; Nasopharyngeal(NP) swabs in vial transport medium  Result Value Ref Range Status   SARS Coronavirus 2 by RT PCR NEGATIVE NEGATIVE Final    Comment: (NOTE) SARS-CoV-2  target nucleic acids are NOT DETECTED.  The SARS-CoV-2 RNA is generally detectable in upper respiratory specimens during the acute phase of infection. The lowest concentration of SARS-CoV-2 viral copies this assay can detect is 138 copies/mL. A negative result does not preclude SARS-Cov-2 infection and should not be used as the sole basis for treatment or other patient management decisions. A negative result may occur with  improper specimen collection/handling, submission of specimen other than nasopharyngeal swab, presence of viral mutation(s) within the areas targeted by this assay, and inadequate number of viral copies(<138 copies/mL). A negative result must be combined with clinical observations, patient history, and epidemiological information. The expected result is Negative.  Fact Sheet for Patients:  EntrepreneurPulse.com.au  Fact Sheet for Healthcare Providers:  IncredibleEmployment.be  This test is no t yet approved or cleared by the [place] FDA  and  has been authorized for detection and/or diagnosis of SARS-CoV-2 by FDA under an Emergency Use Authorization (EUA). This EUA will remain  in effect (meaning this test can be used) for the duration of the COVID-19 declaration under Section 564(b)(1) of the Act, 21 U.S.C.section 360bbb-3(b)(1), unless the authorization is terminated  or revoked sooner.       Influenza A by PCR NEGATIVE NEGATIVE Final   Influenza B by PCR NEGATIVE NEGATIVE Final    Comment: (NOTE) The Xpert Xpress SARS-CoV-2/FLU/RSV plus assay is intended as an aid in the diagnosis of influenza from Nasopharyngeal swab specimens and should not be used as a sole basis for treatment. Nasal washings and aspirates are unacceptable for Xpert Xpress SARS-CoV-2/FLU/RSV testing.  Fact Sheet for Patients: EntrepreneurPulse.com.au  Fact Sheet for Healthcare  Providers: IncredibleEmployment.be  This test is not yet approved or cleared by the [place] FDA and has been authorized for detection and/or diagnosis of SARS-CoV-2 by FDA under an Emergency Use Authorization (EUA). This EUA will remain in effect (meaning this test can be used) for the duration of the COVID-19 declaration under Section 564(b)(1) of the Act, 21 U.S.C. section 360bbb-3(b)(1), unless the authorization is terminated or revoked.  Performed at [facility], [address] [place], [place] [address]   Culture, blood (routine x 2)     Status: None (Preliminary result)   Collection Time: 03/16/20  6:28 AM   Specimen: BLOOD  Result Value Ref Range Status   Specimen Description BLOOD RIGHT FA  Final   Special Requests   Final    BOTTLES DRAWN AEROBIC AND ANAEROBIC Blood Culture adequate volume   Culture   Final    NO GROWTH 1 DAY Performed at [facility], [address] [place], [place] [address]    Report Status PENDING  Incomplete  Culture, blood (routine x 2)     Status: None (Preliminary result)   Collection Time: 03/16/20  7:45 AM   Specimen: BLOOD  Result Value Ref Range Status   Specimen Description BLOOD LEFT AC  Final   Special Requests   Final    BOTTLES DRAWN AEROBIC AND ANAEROBIC Blood Culture adequate volume   Culture   Final    NO GROWTH < 24 HOURS Performed at [facility], [address] [place],  [address]    Report Status PENDING  Incomplete         Radiology Studies: DG Chest Port 1 View  Result Date: 03/16/2020 CLINICAL DATA:  Shortness of breath. EXAM: PORTABLE CHEST 1 VIEW COMPARISON:  03/10/2020. 03/01/2020. 02/03/2020. CT 10/15/2019, 06/23/2019. FINDINGS: Mediastinum and hilar structures normal. Heart size normal. Low lung volumes with mild bibasilar atelectasis. Mild bibasilar infiltrates cannot be excluded. No pleural effusion or pneumothorax. Interposition of the colon under  the right hemidiaphragm again noted. No acute bony abnormality. IMPRESSION: Low lung volumes with mild bibasilar atelectasis. Mild bibasilar infiltrates cannot be excluded. Electronically Signed   By: Marcello Moores  Register   On: 03/16/2020 05:11        Scheduled Meds: . amLODipine  5 mg Oral Daily  . ARIPiprazole  15 mg Oral Daily  . donepezil  5 mg Oral QHS  . enoxaparin (LOVENOX) injection  40 mg Subcutaneous Q24H  . ipratropium-albuterol  3 mL Nebulization Q6H  . irbesartan  150 mg Oral Daily  . methylPREDNISolone (SOLU-MEDROL) injection  40 mg Intravenous Q12H  . nicotine  14 mg Transdermal Daily  . pantoprazole  20 mg Oral Daily  .  QUEtiapine  100 mg Oral Daily  . QUEtiapine  25 mg Oral Daily  . QUEtiapine  600 mg Oral QHS  . sodium chloride flush  3 mL Intravenous Q12H  . traZODone  100 mg Oral QHS  . vitamin B-12  1,000 mcg Oral Daily   Continuous Infusions: . sodium chloride    . cefTRIAXone (ROCEPHIN)  IV Stopped (03/16/20 1303)     LOS: 1 day    Time spent: 10 min    Desma Maxim, MD [facility]   If 7PM-7AM, please contact night-coverage [email or web address] Password [facility] 03/17/2020, 9:04 AM

## 2020-03-17 NOTE — ED Notes (Signed)
Mansy, MD notified of pt increase in HR and how agitated pt is.

## 2020-03-17 NOTE — ED Notes (Signed)
Breakfast ordered for pt per pt's request.

## 2020-03-17 NOTE — Progress Notes (Signed)
Initial Nutrition Assessment  DOCUMENTATION CODES:   Severe malnutrition in context of chronic illness  INTERVENTION:   Ensure Enlive po TID, each supplement provides 350 kcal and 20 grams of protein  MVI po daily   NUTRITION DIAGNOSIS:   Severe Malnutrition related to chronic illness (COPD) as evidenced by moderate fat depletion,severe fat depletion,severe muscle depletion.  GOAL:   Patient will meet greater than or equal to 90% of their needs  MONITOR:   PO intake,Supplement acceptance,Labs,Weight trends,Skin,I & O's  REASON FOR ASSESSMENT:   Consult COPD Protocol  ASSESSMENT:   70 y.o. male with medical history significant for COPD, schizophrenia, hypertension and chronic respiratory failure to the ER by EMS for evaluation of shortness of breath and chest pain.   RD familiar with this patient from a recent previous admit. Pt reports good appetite and oral intake pta and in hospital. Pt documented to be eating 100% of meals in hospital; pt was eating 100% of meals during his last admit. Pt is documented to have vomited once today. Pt reports that he does drink chocolate and vanilla Ensure and that he would like to have this in hospital. RD will add supplements and MVI to help pt meet his estimated needs. Per chart, pt is weight stable pta.   Medications reviewed and include: azithromycin, lovenox, protonix, B12  Labs reviewed: wbc- 16.3(H)  NUTRITION - FOCUSED PHYSICAL EXAM:  Flowsheet Row Most Recent Value  Orbital Region Moderate depletion  Upper Arm Region Severe depletion  Thoracic and Lumbar Region Moderate depletion  Buccal Region Moderate depletion  Temple Region Severe depletion  Clavicle Bone Region Severe depletion  Clavicle and Acromion Bone Region Severe depletion  Scapular Bone Region Severe depletion  Dorsal Hand Severe depletion  Patellar Region Severe depletion  Anterior Thigh Region Severe depletion  Posterior Calf Region Severe depletion   Edema (RD Assessment) None  Hair Reviewed  Eyes Reviewed  Mouth Reviewed  Skin Reviewed  Nails Reviewed     Diet Order:   Diet Order            Diet 2 gram sodium Room service appropriate? Yes; Fluid consistency: Thin  Diet effective now                EDUCATION NEEDS:   Education needs have been addressed  Skin:  Skin Assessment: Reviewed RN Assessment (ecchymosis)  Last BM:  3/1  Height:   Ht Readings from Last 1 Encounters:  03/17/20 5\' 4"  (1.626 m)    Weight:   Wt Readings from Last 1 Encounters:  03/17/20 56.7 kg    Ideal Body Weight:  54.5 kg  BMI:  Body mass index is 21.46 kg/m.  Estimated Nutritional Needs:   Kcal:  1700-1900kcal/day  Protein:  85-95g/day  Fluid:  1.5-1.7L/day  Koleen Distance MS, RD, LDN Please refer to [facility] for RD and/or RD on-call/weekend/after hours pager

## 2020-03-17 NOTE — Telephone Encounter (Signed)
Spoke with patient's caregiver Brent [facility] and scheduled an in-person Palliative Consult for 04/07/20 @ 2:30PM. Patient is currently admitted at [facility], but we went ahead and schedule consult out a couple weeks.   COVID screening was negative. No pets in home. Patient lives in a group home. There is 5 residents total.   Consent obtained; updated Outlook/Netsmart/Team List and Epic.  Family is aware they may be receiving a call from NP the day before or day of to confirm appointment.

## 2020-03-17 NOTE — Progress Notes (Signed)
Mobility Specialist - Progress Note   Pre-mobility: 102 HR, 100% SpO2 During mobility: 115 HR, 90% SpO2 Post-mobility: 109 HR, 98% SpO2   Pt was lying in stretcher upon arrival utilizing 2L [place] O2. Pt agreed to session. Pt denied pain, nausea, and fatigue. Pt is modI in all transfers this date, including ambulation. Pt denied dizziness upon sitting. Pt stood from stretcher with close supervision. Pt ambulated 2 laps around nursing stations. No LOB noted. Pt denied SOB throughout activity. O2 >/= 90% on 2L with max HR of 115 bpm. Pt also denied weakness in LE with movement, but did c/o LE feeling "a little stiff" during ambulation. Pt returned EOB and c/o feeling slightly winded. Overall, pt tolerated session well. Pt was left in bed with all needs in reach.    Kathee Delton Mobility Specialist 03/17/20, 1:24 PM

## 2020-03-18 DIAGNOSIS — E43 Unspecified severe protein-calorie malnutrition: Secondary | ICD-10-CM | POA: Insufficient documentation

## 2020-03-18 DIAGNOSIS — J441 Chronic obstructive pulmonary disease with (acute) exacerbation: Secondary | ICD-10-CM | POA: Diagnosis not present

## 2020-03-18 LAB — COMPREHENSIVE METABOLIC PANEL
ALT: 119 U/L — ABNORMAL HIGH (ref 0–44)
AST: 41 U/L (ref 15–41)
Albumin: 3.7 g/dL (ref 3.5–5.0)
Alkaline Phosphatase: 77 U/L (ref 38–126)
Anion gap: 10 (ref 5–15)
BUN: 20 mg/dL (ref 8–23)
CO2: 28 mmol/L (ref 22–32)
Calcium: 9.2 mg/dL (ref 8.9–10.3)
Chloride: 101 mmol/L (ref 98–111)
Creatinine, Ser: 0.97 mg/dL (ref 0.61–1.24)
GFR, Estimated: >60 mL/min (ref >60)
Glucose, Bld: 85 mg/dL (ref 70–99)
Potassium: 4.3 mmol/L (ref 3.5–5.1)
Sodium: 139 mmol/L (ref 135–145)
Total Bilirubin: 0.6 mg/dL (ref 0.3–1.2)
Total Protein: 6.2 g/dL — ABNORMAL LOW (ref 6.5–8.1)

## 2020-03-18 MED ORDER — PREDNISONE 20 MG PO TABS: 40.0000 mg | ORAL_TABLET | Freq: Every day | ORAL | 0 refills | Status: DC

## 2020-03-18 MED ORDER — AZITHROMYCIN 250 MG PO TABS: 250.0000 mg | ORAL_TABLET | Freq: Every day | ORAL | 0 refills | Status: DC

## 2020-03-18 NOTE — Discharge Summary (Signed)
Carlos Nelson YQI:[id number] DOB: December 25, 1950 DOA: 03/16/2020  PCP: [facility]  Admit date: 03/16/2020 Discharge date: 03/18/2020  Time spent: 35 minutes  Recommendations for Outpatient Follow-up:  1. Outpatient f/u with PCP for further copd care     Discharge Diagnoses:  Principal Problem:   COPD with acute exacerbation ([place]) Active Problems:   Schizophrenia ([place])   Hypertension   Acute respiratory failure ([facility])   Transaminitis   CAD (coronary artery disease)   Tobacco abuse   Protein-calorie malnutrition, severe   Discharge Condition: fair  Diet recommendation: heart healthy  Filed Weights   03/17/20 0410 03/17/20 1450 03/18/20 0549  Weight: 56 kg 56.7 kg 56.8 kg    History of present illness:   Carlos Nelson is a 70 y.o. male with medical history significant for COPD, schizophrenia, hypertension and chronic respiratory failure to the ER by EMS for evaluation of shortness of breath and chest pain.  Shortness of breath was sudden onset and when EMS arrived patient was noted to be tripoding with room air pulse oximetry of about 87%.  Patient was placed on CPAP to reduce work of breathing and received Solu-Medrol 125 mg IV as well as 2 DuoNeb's with improvement in his pulse oximetry to 95%. Shortness of breath is associated with pleuritic chest pain and a cough productive of brown phlegm.   He denies having any fever, no chills, no abdominal pain, no nausea, no vomiting, no diarrhea, no nocturia, no dysuria, no hematuria, no headache, no blurred vision, no focal deficits, no diaphoresis, no palpitations.  Hospital Course:  # COPD with acute exacerbation # Acute hypoxic respiratory failure Patient has a known history of COPD and continues to smoke cigarettes He presents to the ER for evaluation of sudden onset shortness of breath associated with pleuritic chest pain and a cough productive of brown phlegm. Initially required bipap, now weaned off o2,  breathing comfortably - finish 5 day course azithromycin and 7 days prednisone - continue home meds - pcp f/u  # Nicotine dependence - declines patch  # Schizophrenia - cont home seroquel, abilify, trazodone, aricept  # Hypertension BP wnl - Continue amlodipine/olmesartan daily  Procedures:  none   Consultations:  none  Discharge Exam: Vitals:   03/18/20 0506 03/18/20 0752  BP: (!) 149/94 (!) 140/95  Pulse: 99 95  Resp: 20 16  Temp: 97.7 F (36.5 C) 97.8 F (36.6 C)  SpO2: 100% 99%    General exam: Appears calm and comfortable  Respiratory system: scattered exp wheeze Cardiovascular system: S1 & S2 heard, RRR. No JVD, murmurs, rubs, gallops or clicks. No pedal edema. Gastrointestinal system: Abdomen is nondistended, soft and nontender. No organomegaly or masses felt. Normal bowel sounds heard. Central nervous system: Alert and oriented. No focal neurological deficits. Extremities: Symmetric 5 x 5 power. Skin: No rashes, lesions or ulcers Psychiatry: pleasant  Discharge Instructions   Discharge Instructions    Diet - low sodium heart healthy   Complete by: As directed    Increase activity slowly   Complete by: As directed      Allergies as of 03/18/2020      Reactions   Ace Inhibitors Swelling   Ok with benazapril, per grouphome      Medication List    TAKE these medications   albuterol 108 (90 Base) MCG/ACT inhaler Commonly known as: VENTOLIN HFA Inhale 2 puffs into the lungs every 4 (four) hours as needed for wheezing or shortness of breath.   amLODipine-olmesartan  5-20 MG tablet Commonly known as: AZOR Take 1 tablet by mouth daily.   ARIPiprazole 15 MG tablet Commonly known as: ABILIFY Take 15 mg by mouth daily.   azithromycin 250 MG tablet Commonly known as: ZITHROMAX Take 1 tablet (250 mg total) by mouth daily.   donepezil 5 MG tablet Commonly known as: ARICEPT Take 5 mg by mouth at bedtime.   hydrOXYzine 50 MG tablet Commonly  known as: ATARAX/VISTARIL Take 50 mg by mouth every 6 (six) hours as needed for anxiety or itching.   ipratropium-albuterol 0.5-2.5 (3) MG/3ML Soln Commonly known as: DUONEB Take 3 mLs by nebulization every 4 (four) hours for 14 days.   pantoprazole 20 MG tablet Commonly known as: Protonix Take 1 tablet (20 mg total) by mouth daily.   predniSONE 20 MG tablet Commonly known as: DELTASONE Take 2 tablets (40 mg total) by mouth daily with breakfast. Start taking on: March 19, 2020   QUEtiapine 100 MG tablet Commonly known as: SEROQUEL Take 100 mg by mouth daily.   QUEtiapine 200 MG tablet Commonly known as: SEROquel Take 3 tablets (600 mg total) by mouth at bedtime.   QUEtiapine 25 MG tablet Commonly known as: SEROQUEL Take 25 mg by mouth daily.   traZODone 100 MG tablet Commonly known as: DESYREL Take 100 mg by mouth at bedtime.   vitamin B-12 1000 MCG tablet Commonly known as: CYANOCOBALAMIN Take 1,000 mcg by mouth daily.      Allergies  Allergen Reactions  . Ace Inhibitors Swelling    Ok with benazapril, per grouphome    Follow-up Information    [facility] Follow up.   Contact information: [place] [address] [phone]                The results of significant diagnostics from this hospitalization (including imaging, microbiology, ancillary and laboratory) are listed below for reference.    Significant Diagnostic Studies: DG Chest 2 View  Result Date: 02/20/2020 CLINICAL DATA:  Chest pain EXAM: CHEST - 2 VIEW COMPARISON:  02/03/2020 FINDINGS: There is hyperinflation of the lungs compatible with COPD. Heart and mediastinal contours are within normal limits. No focal opacities or effusions. No acute bony abnormality. IMPRESSION: COPD.  No active disease. Electronically Signed   By: Rolm Baptise M.D.   On: 02/20/2020 03:00   DG Chest Port 1 View  Result Date: 03/16/2020 CLINICAL DATA:  Shortness of breath. EXAM:  PORTABLE CHEST 1 VIEW COMPARISON:  03/10/2020. 03/01/2020. 02/03/2020. CT 10/15/2019, 06/23/2019. FINDINGS: Mediastinum and hilar structures normal. Heart size normal. Low lung volumes with mild bibasilar atelectasis. Mild bibasilar infiltrates cannot be excluded. No pleural effusion or pneumothorax. Interposition of the colon under the right hemidiaphragm again noted. No acute bony abnormality. IMPRESSION: Low lung volumes with mild bibasilar atelectasis. Mild bibasilar infiltrates cannot be excluded. Electronically Signed   By: Marcello Moores  Register   On: 03/16/2020 05:11   DG Chest Port 1 View  Result Date: 03/10/2020 CLINICAL DATA:  Shortness of breath, history of COPD EXAM: PORTABLE CHEST 1 VIEW COMPARISON:  Radiograph 03/01/2020 FINDINGS: Chronic hyperinflation and emphysematous changes throughout the lungs. No new consolidative opacity is seen. No convincing features of edema. Stable cardiomediastinal contours. No acute osseous or soft tissue abnormality. Degenerative changes are present in the imaged spine and shoulders. Telemetry leads overlie the chest. IMPRESSION: Chronic hyperinflation and emphysematous changes. No acute cardiopulmonary abnormality. Electronically Signed   By: Lovena Le M.D.   On: 03/10/2020 03:06   DG  Chest Portable 1 View  Result Date: 03/01/2020 CLINICAL DATA:  Shortness of breath.  History of COPD. EXAM: PORTABLE CHEST 1 VIEW COMPARISON:  02/20/2020 FINDINGS: Chronic hyperinflation and apical predominant emphysema. No acute airspace disease. Normal heart size and mediastinal contours. No pleural fluid or pneumothorax. No acute osseous abnormalities are seen. IMPRESSION: Chronic hyperinflation and emphysema. No acute abnormality. Electronically Signed   By: Keith Rake M.D.   On: 03/01/2020 03:20    Microbiology: Recent Results (from the past 240 hour(s))  Resp Panel by RT-PCR (Flu A&B, Covid) Nasopharyngeal Swab     Status: None   Collection Time: 03/10/20  2:51 AM    Specimen: Nasopharyngeal Swab; Nasopharyngeal(NP) swabs in vial transport medium  Result Value Ref Range Status   SARS Coronavirus 2 by RT PCR NEGATIVE NEGATIVE Final    Comment: (NOTE) SARS-CoV-2 target nucleic acids are NOT DETECTED.  The SARS-CoV-2 RNA is generally detectable in upper respiratory specimens during the acute phase of infection. The lowest concentration of SARS-CoV-2 viral copies this assay can detect is 138 copies/mL. A negative result does not preclude SARS-Cov-2 infection and should not be used as the sole basis for treatment or other patient management decisions. A negative result may occur with  improper specimen collection/handling, submission of specimen other than nasopharyngeal swab, presence of viral mutation(s) within the areas targeted by this assay, and inadequate number of viral copies(<138 copies/mL). A negative result must be combined with clinical observations, patient history, and epidemiological information. The expected result is Negative.  Fact Sheet for Patients:  EntrepreneurPulse.com.au  Fact Sheet for Healthcare Providers:  IncredibleEmployment.be  This test is no t yet approved or cleared by the [place] FDA and  has been authorized for detection and/or diagnosis of SARS-CoV-2 by FDA under an Emergency Use Authorization (EUA). This EUA will remain  in effect (meaning this test can be used) for the duration of the COVID-19 declaration under Section 564(b)(1) of the Act, 21 U.S.C.section 360bbb-3(b)(1), unless the authorization is terminated  or revoked sooner.       Influenza A by PCR NEGATIVE NEGATIVE Final   Influenza B by PCR NEGATIVE NEGATIVE Final    Comment: (NOTE) The Xpert Xpress SARS-CoV-2/FLU/RSV plus assay is intended as an aid in the diagnosis of influenza from Nasopharyngeal swab specimens and should not be used as a sole basis for treatment. Nasal washings and aspirates are  unacceptable for Xpert Xpress SARS-CoV-2/FLU/RSV testing.  Fact Sheet for Patients: EntrepreneurPulse.com.au  Fact Sheet for Healthcare Providers: IncredibleEmployment.be  This test is not yet approved or cleared by the [place] FDA and has been authorized for detection and/or diagnosis of SARS-CoV-2 by FDA under an Emergency Use Authorization (EUA). This EUA will remain in effect (meaning this test can be used) for the duration of the COVID-19 declaration under Section 564(b)(1) of the Act, 21 U.S.C. section 360bbb-3(b)(1), unless the authorization is terminated or revoked.  Performed at [facility], [address] [place], [place] [address]   Resp Panel by RT-PCR (Flu A&B, Covid) Nasopharyngeal Swab     Status: None   Collection Time: 03/16/20  5:06 AM   Specimen: Nasopharyngeal Swab; Nasopharyngeal(NP) swabs in vial transport medium  Result Value Ref Range Status   SARS Coronavirus 2 by RT PCR NEGATIVE NEGATIVE Final    Comment: (NOTE) SARS-CoV-2 target nucleic acids are NOT DETECTED.  The SARS-CoV-2 RNA is generally detectable in upper respiratory specimens during the acute phase of infection. The lowest concentration of SARS-CoV-2 viral  copies this assay can detect is 138 copies/mL. A negative result does not preclude SARS-Cov-2 infection and should not be used as the sole basis for treatment or other patient management decisions. A negative result may occur with  improper specimen collection/handling, submission of specimen other than nasopharyngeal swab, presence of viral mutation(s) within the areas targeted by this assay, and inadequate number of viral copies(<138 copies/mL). A negative result must be combined with clinical observations, patient history, and epidemiological information. The expected result is Negative.  Fact Sheet for Patients:  EntrepreneurPulse.com.au  Fact Sheet for Healthcare  Providers:  IncredibleEmployment.be  This test is no t yet approved or cleared by the [place] FDA and  has been authorized for detection and/or diagnosis of SARS-CoV-2 by FDA under an Emergency Use Authorization (EUA). This EUA will remain  in effect (meaning this test can be used) for the duration of the COVID-19 declaration under Section 564(b)(1) of the Act, 21 U.S.C.section 360bbb-3(b)(1), unless the authorization is terminated  or revoked sooner.       Influenza A by PCR NEGATIVE NEGATIVE Final   Influenza B by PCR NEGATIVE NEGATIVE Final    Comment: (NOTE) The Xpert Xpress SARS-CoV-2/FLU/RSV plus assay is intended as an aid in the diagnosis of influenza from Nasopharyngeal swab specimens and should not be used as a sole basis for treatment. Nasal washings and aspirates are unacceptable for Xpert Xpress SARS-CoV-2/FLU/RSV testing.  Fact Sheet for Patients: EntrepreneurPulse.com.au  Fact Sheet for Healthcare Providers: IncredibleEmployment.be  This test is not yet approved or cleared by the [place] FDA and has been authorized for detection and/or diagnosis of SARS-CoV-2 by FDA under an Emergency Use Authorization (EUA). This EUA will remain in effect (meaning this test can be used) for the duration of the COVID-19 declaration under Section 564(b)(1) of the Act, 21 U.S.C. section 360bbb-3(b)(1), unless the authorization is terminated or revoked.  Performed at [facility], [address] [place], [place] [address]   Culture, blood (routine x 2)     Status: None (Preliminary result)   Collection Time: 03/16/20  6:28 AM   Specimen: BLOOD  Result Value Ref Range Status   Specimen Description BLOOD RIGHT FA  Final   Special Requests   Final    BOTTLES DRAWN AEROBIC AND ANAEROBIC Blood Culture adequate volume   Culture   Final    NO GROWTH 2 DAYS Performed at [facility], [address] [place], [place] [address]    Report Status PENDING  Incomplete  Culture, blood (routine x 2)     Status: None (Preliminary result)   Collection Time: 03/16/20  7:45 AM   Specimen: BLOOD  Result Value Ref Range Status   Specimen Description BLOOD LEFT AC  Final   Special Requests   Final    BOTTLES DRAWN AEROBIC AND ANAEROBIC Blood Culture adequate volume   Culture   Final    NO GROWTH 2 DAYS Performed at [facility], [place], [place] [address]    Report Status PENDING  Incomplete     Labs: Basic Metabolic Panel: Recent Labs  Lab 03/16/20 0506 03/16/20 0628 03/17/20 0409 03/18/20 0618  NA  --  139 137 139  K  --  4.6 4.3 4.3  CL  --  105 104 101  CO2  --  27 24 28   GLUCOSE PENDING 115* 169* 85  BUN  --  21 22 20   CREATININE  --  1.02 1.02 0.97  CALCIUM  --  9.2 9.0 9.2   Liver Function Tests: Recent Labs  Lab 03/16/20 0628 03/17/20 0409 03/18/20 0618  AST 38 35 41  ALT 121* 97* 119*  ALKPHOS 84 82 77  BILITOT 0.8 0.5 0.6  PROT 6.4* 6.0* 6.2*  ALBUMIN 3.7 3.4* 3.7   No results for input(s): LIPASE, AMYLASE in the last 168 hours. No results for input(s): AMMONIA in the last 168 hours. CBC: Recent Labs  Lab 03/16/20 0506 03/17/20 0409  WBC 9.3 16.3*  NEUTROABS 6.5  --   HGB 14.8 13.9  HCT 45.8 42.2  MCV 91.1 90.9  PLT 223 198   Cardiac Enzymes: No results for input(s): CKTOTAL, CKMB, CKMBINDEX, TROPONINI in the last 168 hours. BNP: BNP (last 3 results) Recent Labs    01/18/20 0441 03/10/20 0251 03/16/20 0506  BNP 18.5 25.9 18.6    ProBNP (last 3 results) No results for input(s): PROBNP in the last 8760 hours.  CBG: No results for input(s): GLUCAP in the last 168 hours.     Signed:  Desma Maxim MD.  [facility] 03/18/2020, 10:40 AM

## 2020-03-18 NOTE — Plan of Care (Addendum)
Patient is ready for discharge. Transportation has been arranged and is outside the main hospital entrance. AVS has been discussed in great detail with the patient and all questions answered to include medication questions. Patient provided with education on new meds and all appointments. All IV's removed personal belongings returned and patient transported by Probation officer to The main entrance where cab is waiting to carry him home. Telemetry Box removed, cleaned, and turned in. Patient Discharged.   Problem: Education: Goal: Knowledge of General Education information will improve Description: Including pain rating scale, medication(s)/side effects and non-pharmacologic comfort measures Outcome: Adequate for Discharge   Problem: Health Behavior/Discharge Planning: Goal: Ability to manage health-related needs will improve Outcome: Adequate for Discharge   Problem: Clinical Measurements: Goal: Ability to maintain clinical measurements within normal limits will improve Outcome: Adequate for Discharge Goal: Will remain free from infection Outcome: Adequate for Discharge Goal: Diagnostic test results will improve Outcome: Adequate for Discharge Goal: Respiratory complications will improve Outcome: Adequate for Discharge Goal: Cardiovascular complication will be avoided Outcome: Adequate for Discharge   Problem: Activity: Goal: Risk for activity intolerance will decrease Outcome: Adequate for Discharge   Problem: Nutrition: Goal: Adequate nutrition will be maintained Outcome: Adequate for Discharge   Problem: Coping: Goal: Level of anxiety will decrease Outcome: Adequate for Discharge   Problem: Elimination: Goal: Will not experience complications related to bowel motility Outcome: Adequate for Discharge Goal: Will not experience complications related to urinary retention Outcome: Adequate for Discharge   Problem: Pain Managment: Goal: General experience of comfort will improve Outcome:  Adequate for Discharge   Problem: Safety: Goal: Ability to remain free from injury will improve Outcome: Adequate for Discharge   Problem: Skin Integrity: Goal: Risk for impaired skin integrity will decrease Outcome: Adequate for Discharge   Problem: Education: Goal: Knowledge of disease or condition will improve Outcome: Adequate for Discharge Goal: Knowledge of the prescribed therapeutic regimen will improve Outcome: Adequate for Discharge Goal: Individualized Educational Video(s) Outcome: Adequate for Discharge   Problem: Activity: Goal: Ability to tolerate increased activity will improve Outcome: Adequate for Discharge Goal: Will verbalize the importance of balancing activity with adequate rest periods Outcome: Adequate for Discharge   Problem: Respiratory: Goal: Ability to maintain a clear airway will improve Outcome: Adequate for Discharge Goal: Levels of oxygenation will improve Outcome: Adequate for Discharge Goal: Ability to maintain adequate ventilation will improve Outcome: Adequate for Discharge

## 2020-03-18 NOTE — Progress Notes (Signed)
Mobility Specialist - Progress Note   03/18/20 1214  Mobility  Activity Ambulated in hall  Level of Assistance Independent  Assistive Device None  Distance Ambulated (ft) 320 ft  Mobility Response Tolerated well  Mobility performed by Mobility specialist  $Mobility charge 1 Mobility    Pre-mobility: 123 HR, 93% SpO2 During mobility: 129 HR, 88% SpO2 Post-mobility: 122 HR, 93% SpO2   Pt was sitting EOB upon arrival utilizing room air. Pt agreed to session. Pt denied pain, nausea, and fatigue. Pt is independent in all transfers this date, including ambulation. Pt stood with no c/o dizziness. Pt ambulated 320' in hallway without AD use. Pt denied SOB during activity. O2 >/= 88% throughout session. Pt did report stiffness in his LE. Upon return to room, pt c/o mild SOB and PLB was utilized. O2 sat at 93% at time of complaint. Pt reports fatigue, rating his RPE for activity a 6/10. Prior to exit, pt began c/o chest pain "5/10". Nurse immediately notified. Overall, pt tolerated session well. Pt was left in bed with all needs in reach.    Kathee Delton Mobility Specialist 03/18/20, 12:21 PM

## 2020-03-18 NOTE — Discharge Instructions (Signed)

## 2020-03-18 NOTE — TOC Progression Note (Signed)
Transition of Care [facility]) - Progression Note    Patient Details  Name: Carlos Nelson MRN: [id number] Date of Birth: 25-Feb-1950  [facility]) CM/SW [employer], RN Phone Number: 03/18/2020, 12:10 PM    Barriers to Discharge: Barriers Resolved  Expected Discharge Plan and Services           Expected Discharge Date: 03/18/20               DME Arranged: N/A DME Agency: NA       HH Arranged: NA HH Agency: NA         Social Determinants of Health (SDOH) Interventions    Readmission Risk Interventions No flowsheet data found.

## 2020-03-21 LAB — CULTURE, BLOOD (ROUTINE X 2)
Culture: NO GROWTH
Culture: NO GROWTH
Special Requests: ADEQUATE
Special Requests: ADEQUATE

## 2020-03-23 ENCOUNTER — Encounter: Payer: Self-pay | Admitting: Family

## 2020-03-23 ENCOUNTER — Other Ambulatory Visit: Payer: Self-pay

## 2020-03-23 ENCOUNTER — Ambulatory Visit: Payer: Medicare Other | Admitting: Family

## 2020-03-23 VITALS — BP 162/84 | HR 104 | Ht 64.0 in | Wt 130.0 lb

## 2020-03-23 DIAGNOSIS — R0781 Pleurodynia: Secondary | ICD-10-CM

## 2020-03-23 DIAGNOSIS — F209 Schizophrenia, unspecified: Secondary | ICD-10-CM | POA: Diagnosis not present

## 2020-03-23 DIAGNOSIS — Z720 Tobacco use: Secondary | ICD-10-CM

## 2020-03-23 DIAGNOSIS — I10 Essential (primary) hypertension: Secondary | ICD-10-CM | POA: Diagnosis not present

## 2020-03-23 DIAGNOSIS — R000 Tachycardia, unspecified: Secondary | ICD-10-CM | POA: Diagnosis not present

## 2020-03-23 DIAGNOSIS — J449 Chronic obstructive pulmonary disease, unspecified: Secondary | ICD-10-CM

## 2020-03-23 MED ORDER — AMLODIPINE-OLMESARTAN 10-20 MG PO TABS: 1.0000 | ORAL_TABLET | Freq: Every day | ORAL | 1 refills | Status: AC

## 2020-03-23 NOTE — Patient Instructions (Addendum)
Medication Instructions:  Your physician has recommended you make the following change in your medication:   CHANGE Amlodipine-Olmesartan 10-20mg  by mouth daily  *If you need a refill on your cardiac medications before your next appointment, please call your pharmacy*   Lab Work: None ordered today.    Testing/Procedures: Your EKG today shows sinus tachycardia which is a stable finding.   Follow-Up: At [facility], you and your health needs are our priority.  As part of our continuing mission to provide you with exceptional heart care, we have created designated Provider Care Teams.  These Care Teams include your primary Cardiologist (physician) and Advanced Practice Providers (APPs -  Physician Assistants and Nurse Practitioners) who all work together to provide you with the care you need, when you need it.  We recommend signing up for the patient portal called "MyChart".  Sign up information is provided on this After Visit Summary.  MyChart is used to connect with patients for Virtual Visits (Telemedicine).  Patients are able to view lab/test results, encounter notes, upcoming appointments, etc.  Non-urgent messages can be sent to your provider as well.   To learn more about what you can do with MyChart, go to [email or web address].    Your next appointment:   6 month(s)  The format for your next appointment:   In Person  Provider:   You may see Nelva Bush, MD or one of the following Advanced Practice Providers on your designated Care Team:    Murray Hodgkins, NP  Christell Faith, PA-C  Marrianne Mood, PA-C  Cadence [place], [place]  Laurann Montana, NP  Other Instructions   Pursed Lip Breathing Pursed lip breathing is a technique to relieve the feeling of being short of breath. Some long-term respiratory conditions, such as chronic obstructive pulmonary disease (COPD) and severe asthma, can make it hard to breathe out (exhale) all the air in your lungs. This can cause  air that has less oxygen than normal to build up in your lungs (air trapping). Trapped air means your lungs fill with less fresh air when you breathe in, or inhale. As a result, you feel short of breath. Pursed lip breathing keeps your airways open longer when you exhale and empties more air from your lungs. This makes more space for fresh air when you inhale. Pursed lip breathing can also slow down your breathing and keep your body from having to work so hard to breathe. Over time, pursed lip breathing may help you be able to be more physically active and do more activities. How to perform pursed lip breathing Being short of breath can make you tense and anxious. Before you start this breathing exercise, take a minute to relax your shoulders and close your eyes. Then: 1. Start the exercise by closing your mouth. 2. Breathe in through your nose, taking a normal breath. You can do this at your normal rate of breathing. If you feel you are not getting enough air, breathe in while slowly counting to 2 or 3. 3. Pucker (purse) your lips as if you were going to whistle. 4. Gently tighten the muscles of your abdomenor press on your abdomen to help push the air out. 5. Breathe out slowly through your pursed lips. Take at least twice as long to breathe out as it takes you to breathe in. 6. Make sure that you breathe out all of the air, but do not force air out. 7. Repeat the exercise until your breathing improves. Ask your health care provider  how often and how long to do this exercise.   Follow these instructions at home:  Take over-the-counter and prescription medicines only as told by your health care provider.  Return to your normal activities as told by your health care provider. Ask your health care provider what activities are safe for you.  Do not use any products that contain nicotine or tobacco, such as cigarettes, e-cigarettes, and chewing tobacco. If you need help quitting, ask your health care  provider.  Keep all follow-up visits as told by your health care provider. This is important. Where to find more information  American Lung Association: www.lung.org Contact a health care provider if:  Your shortness of breath gets worse.  You become less able to exercise or be active.  You develop a cough.  You develop a fever.  You experience problems with this breathing technique. Get help right away if:  You are struggling to breathe.  Your shortness of breath prevents you from doing any activity. These symptoms may represent a serious problem that is an emergency. Do not wait to see if the symptoms will go away. Get medical help right away. Call your local emergency services (911 in the [place] Do not drive yourself to the hospital. Summary  Pursed lip breathing is a breathing technique that helps to remove trapped air from your lungs. This technique helps you get more oxygen into your lungs.  Pursed lip breathing can help slow down your breathing and keeps your body from having to work so hard to breathe.  Over time, pursed lip breathing may help you be able to be more physically active.  When performing this technique, take at least twice as long to breathe out as it takes you to breathe in.  Ask your health care provider how often and how long you should do pursed lip breathing. This information is not intended to replace advice given to you by your health care provider. Make sure you discuss any questions you have with your health care provider. Document Revised: 12/15/2018 Document Reviewed: 12/16/2018 Elsevier Patient Education  2021 [employer].

## 2020-03-23 NOTE — Progress Notes (Signed)
Office Visit    Patient Name: Carlos Nelson Date of Encounter: 03/23/2020  PCP:  [facility]  Cardiologist:  Nelva Bush, MD  Advanced Practice Provider:  No care team member to display Electrophysiologist:  None   Chief Complaint    Carlos Nelson is a 70 y.o. male with a hx of schizophrenia, COPD, anxiety, tobacco abuse, hypertension, hyperlipidemia presents today for hospital follow-up  Past Medical History    Past Medical History:  Diagnosis Date  . Chronic respiratory failure with hypoxia ([place])   . COPD (chronic obstructive pulmonary disease) ([place])   . Hypertension   . Schizophrenia [facility])    Past Surgical History:  Procedure Laterality Date  . COLONOSCOPY    . COLONOSCOPY WITH PROPOFOL N/A 12/13/2016   Procedure: COLONOSCOPY WITH PROPOFOL;  Surgeon: Lollie Sails, MD;  Location: [facility] ENDOSCOPY;  Service: Endoscopy;  Laterality: N/A;  . ESOPHAGOGASTRODUODENOSCOPY (EGD) WITH PROPOFOL N/A 12/13/2016   Procedure: ESOPHAGOGASTRODUODENOSCOPY (EGD) WITH PROPOFOL;  Surgeon: Lollie Sails, MD;  Location: [facility] ENDOSCOPY;  Service: Endoscopy;  Laterality: N/A;  . left arm surgery      Allergies  Allergies  Allergen Reactions  . Ace Inhibitors Swelling    Ok with benazapril, per grouphome    History of Present Illness    Carlos Nelson is a 70 y.o. male with a hx of  schizophrenia, COPD, anxiety, tobacco abuse, hypertension, hyperlipidemia last seen by cardiology during hospitalization May 2021.  He was seen in consult May 2021 during hospitalization due to demand ischemia.  He had echocardiogram as well as stress test.  He was offered cardiac catheterization but refused.  Echo 06/09/2019 normal LVEF and no significant valvular abnormalities.  Lexiscan Myoview 06/11/2019 LVEF 63%, no significant ischemia, CT attenuation images with no significant cardiac calcification and no aortic atherosclerosis.   Low risk scan.  He was never seen in office follow-up.Marland Kitchen  He has been seen in the ED multiple times this year for COPD exacerbation and hospitalized 03/10/2020-03/11/2020 as well as 03/16/20-03/18/20.  During most recent hospitalization he was treated with BiPAP, azithromycin, prednisone. BP while hospitalized 130/77 - 172/96 and appears systolic averaging in the 140s.   He resides in a group home with 5 total residents.  There is a consult in for palliative care.  He presents today for follow-up.  Staff member from his group home is present at time of exam.  He reports continued shortness of breath and dyspnea on exertion.  Discussed this is likely related to his underlying COPD.  He does use his as needed inhaler.  He reports chest pain associated with shortness of breath that is also tender on palpation.  Discussed that this is likely pleuritic chest pain and reviewed his echocardiogram and stress test from admission 05/2019 which were reassuring with no evidence of coronary calcification.  Reports no lightheadedness, dizziness, syncope.  Denies edema.  Blood pressure reading from group home yesterday was in the 128N systolic.  Group home staff member tells me that he previously was refusing some of his medications but has not been taking his antihypertensive regimen regularly over the last week.  EKGs/Labs/Other Studies Reviewed:   The following studies were reviewed today:  Echo 06/09/2019 1. Left ventricular ejection fraction, by estimation, is 60 to 65%. The  left ventricle has normal function. Left ventricular endocardial border  not optimally defined to evaluate regional wall motion. There is mild left  ventricular hypertrophy. Left  ventricular diastolic function could not be evaluated.   2. Right ventricular systolic function is normal. The right ventricular  size is normal. Mildly increased right ventricular wall thickness.  Tricuspid regurgitation signal is inadequate for assessing PA  pressure.   3. The mitral valve is grossly normal. Unable to accurately assess mitral  valve regurgitation.   4. The aortic valve is tricuspid. Aortic valve regurgitation is not  well-assessed. Aortic stenosis is not well-assessed.   5. The inferior vena cava is normal in size with greater than 50%  respiratory variability, suggesting right atrial pressure of 3 mmHg.   Lexiscan Myoview 06/11/2019 Pharmacological myocardial perfusion imaging study with no significant  ischemia Normal wall motion, EF estimated at 63% No EKG changes concerning for ischemia at peak stress or in recovery. CT attenuation correction images no significant coronary calcifucation, no aortic atherosclerosis. Low risk scan  EKG:  EKG is  ordered today.  The ekg ordered today demonstrates sinus tachycardia 104 bpm with right atrial enlargement and pulmonary disease pattern.  Recent Labs: 01/18/2020: Magnesium 2.1 03/16/2020: B Natriuretic Peptide 18.6 03/17/2020: Hemoglobin 13.9; Platelets 198 03/18/2020: ALT 119; BUN 20; Creatinine, Ser 0.97; Potassium 4.3; Sodium 139  Recent Lipid Panel    Component Value Date/Time   CHOL 137 01/19/2020 0507   CHOL 134 11/17/2013 0618   TRIG 76 01/19/2020 0507   TRIG 135 11/17/2013 0618   HDL 74 01/19/2020 0507   HDL 56 11/17/2013 0618   CHOLHDL 1.9 01/19/2020 0507   VLDL 15 01/19/2020 0507   VLDL 27 11/17/2013 0618   LDLCALC 48 01/19/2020 0507   LDLCALC 51 11/17/2013 0618    Home Medications   Current Meds  Medication Sig  . albuterol (VENTOLIN HFA) 108 (90 Base) MCG/ACT inhaler Inhale 2 puffs into the lungs every 4 (four) hours as needed for wheezing or shortness of breath.  Marland Kitchen amlodipine-olmesartan (AZOR) 10-20 MG tablet Take 1 tablet by mouth daily.  . ARIPiprazole (ABILIFY) 15 MG tablet Take 15 mg by mouth daily.  Marland Kitchen donepezil (ARICEPT) 5 MG tablet Take 5 mg by mouth at bedtime.  . hydrOXYzine (ATARAX/VISTARIL) 50 MG tablet Take 50 mg by mouth every 6 (six) hours as  needed for anxiety or itching.  Marland Kitchen ipratropium-albuterol (DUONEB) 0.5-2.5 (3) MG/3ML SOLN Take 3 mLs by nebulization every 4 (four) hours for 14 days.  . pantoprazole (PROTONIX) 20 MG tablet Take 1 tablet (20 mg total) by mouth daily.  . QUEtiapine (SEROQUEL) 100 MG tablet Take 100 mg by mouth daily.  . QUEtiapine (SEROQUEL) 200 MG tablet Take 3 tablets (600 mg total) by mouth at bedtime.  Marland Kitchen QUEtiapine (SEROQUEL) 25 MG tablet Take 25 mg by mouth daily.  . traZODone (DESYREL) 100 MG tablet Take 100 mg by mouth at bedtime.  . vitamin B-12 (CYANOCOBALAMIN) 1000 MCG tablet Take 1,000 mcg by mouth daily.  . [DISCONTINUED] amLODipine-olmesartan (AZOR) 5-20 MG tablet Take 1 tablet by mouth daily.     Review of Systems  All other systems reviewed and are otherwise negative except as noted above.  Physical Exam    VS:  BP (!) 162/84 (BP Location: Left Arm, Patient Position: Sitting, Cuff Size: Normal)   Pulse (!) 104   Ht 5\' 4"  (1.626 m)   Wt 130 lb (59 kg)   SpO2 96%   BMI 22.31 kg/m  , BMI Body mass index is 22.31 kg/m.  Wt Readings from Last 3 Encounters:  03/23/20 130 lb (59 kg)  03/18/20 125 lb 3.5  oz (56.8 kg)  03/11/20 124 lb (56.2 kg)    GEN: Thin, well developed, in no acute distress. HEENT: normal. Neck: Supple, no JVD, carotid bruits, or masses. Cardiac: RRR, no murmurs, rubs, or gallops. No clubbing, cyanosis, edema.  Radials/DP/PT 2+ and equal bilaterally.  Respiratory:  Respirations regular and unlabored, clear to auscultation bilaterally. GI: Soft, nontender, nondistended. MS: No deformity or atrophy.  Chest wall tender on palpation Skin: Warm and dry, no rash. Neuro:  Strength and sensation are intact. Psych: Normal affect.  Assessment & Plan    1. Hypertension-BP elevated.  Did not take his blood pressure medication today.  Reports BP at group home has been running 160s.  We will increase his Azor to amlodipine 10 mg-olmesartan 20 mg daily. Rx sent to preferred  pharmacy. If BP remains uncontrolled, consider further increased dose to 10-40mg .  Discussed the importance of taking his blood pressure medication regularly up with Dr. Celesta Aver with me that sometimes he declines.  2. Schizophrenia -presently residing in a group home.  Continue to follow care provider.  3. Tachycardia - Will defer addition of beta blocker due to COPD.   4. Chest pain -EKG today with no acute ST/T wave changes.  Lexiscan Myoview 05/2019 low risk study with no evidence of coronary calcification.  Chest pain is pleuritic in nature and also tender on palpation.  Likely etiology COPD and musculoskeletal.  No indication for ischemic evaluation.  5. COPD / DOE - No signs of acute exacerbation.  Discussed that albuterol inhaler would need to be provided through primary care provider or pulmonology.  Group staff member was agreeable and tells me he has upcoming PCP appointment.  Discussed that most of his shortness of breath dyspnea exertion is due to his COPD and deconditioning.  Palliative care consult in place due to COPD with frequent exacerbations.  6. Tobacco abuse-not interested in quitting. Smoking cessation encouraged. Recommend utilization of 1800QUITNOW.   Disposition: Follow up in 6 month(s) with Dr. Saunders Revel or APP  Signed, Loel Dubonnet, NP 03/23/2020, 10:42 AM [place]

## 2020-03-31 NOTE — [facility] Nurse Program (Signed)
  Dept: [facility] Nurse Program Note  Date of Encounter: 03/31/2020 Patient in for blood pressure check. BP 162/88 Past Medical History: Past Medical History:  Diagnosis Date  . Chronic respiratory failure with hypoxia ([place])   . COPD (chronic obstructive pulmonary disease) ([place])   . Hypertension   . Schizophrenia [facility])     Encounter Details:   Flo Shanks BSN, RN, [facility]

## 2020-04-02 ENCOUNTER — Emergency Department
Admission: EM | Admit: 2020-04-02 | Discharge: 2020-04-02 | Disposition: A | Discharge status: Home / Self Care | Payer: Medicare Other | Attending: Emergency Medicine | Admitting: Emergency Medicine

## 2020-04-02 ENCOUNTER — Emergency Department: Payer: Medicare Other

## 2020-04-02 DIAGNOSIS — I2510 Atherosclerotic heart disease of native coronary artery without angina pectoris: Secondary | ICD-10-CM | POA: Insufficient documentation

## 2020-04-02 DIAGNOSIS — J441 Chronic obstructive pulmonary disease with (acute) exacerbation: Secondary | ICD-10-CM | POA: Diagnosis not present

## 2020-04-02 DIAGNOSIS — Z79899 Other long term (current) drug therapy: Secondary | ICD-10-CM | POA: Insufficient documentation

## 2020-04-02 DIAGNOSIS — I10 Essential (primary) hypertension: Secondary | ICD-10-CM | POA: Diagnosis not present

## 2020-04-02 DIAGNOSIS — Z20822 Contact with and (suspected) exposure to covid-19: Secondary | ICD-10-CM | POA: Insufficient documentation

## 2020-04-02 DIAGNOSIS — F17210 Nicotine dependence, cigarettes, uncomplicated: Secondary | ICD-10-CM | POA: Diagnosis not present

## 2020-04-02 DIAGNOSIS — R0602 Shortness of breath: Secondary | ICD-10-CM | POA: Diagnosis present

## 2020-04-02 LAB — CBC WITH DIFFERENTIAL/PLATELET
Abs Immature Granulocytes: 0.03 10*3/uL (ref 0.00–0.07)
Basophils Absolute: 0 10*3/uL (ref 0.0–0.1)
Basophils Relative: 0 %
Eosinophils Absolute: 0.1 10*3/uL (ref 0.0–0.5)
Eosinophils Relative: 2 %
HCT: 45.8 % (ref 39.0–52.0)
Hemoglobin: 14.9 g/dL (ref 13.0–17.0)
Immature Granulocytes: 0 %
Lymphocytes Relative: 20 %
Lymphs Abs: 1.5 10*3/uL (ref 0.7–4.0)
MCH: 29.6 pg (ref 26.0–34.0)
MCHC: 32.5 g/dL (ref 30.0–36.0)
MCV: 91.1 fL (ref 80.0–100.0)
Monocytes Absolute: 0.6 10*3/uL (ref 0.1–1.0)
Monocytes Relative: 8 %
Neutro Abs: 5.3 10*3/uL (ref 1.7–7.7)
Neutrophils Relative %: 70 %
Platelets: 197 10*3/uL (ref 150–400)
RBC: 5.03 MIL/uL (ref 4.22–5.81)
RDW: 13.1 % (ref 11.5–15.5)
WBC: 7.5 10*3/uL (ref 4.0–10.5)
nRBC: 0 % (ref 0.0–0.2)

## 2020-04-02 LAB — TROPONIN I (HIGH SENSITIVITY)
Troponin I (High Sensitivity): 10 ng/L (ref <18)
Troponin I (High Sensitivity): 9 ng/L (ref <18)

## 2020-04-02 LAB — LIPASE, BLOOD: Lipase: 33 U/L (ref 11–51)

## 2020-04-02 LAB — COMPREHENSIVE METABOLIC PANEL
ALT: 32 U/L (ref 0–44)
AST: 25 U/L (ref 15–41)
Albumin: 3.9 g/dL (ref 3.5–5.0)
Alkaline Phosphatase: 78 U/L (ref 38–126)
Anion gap: 2 — ABNORMAL LOW (ref 5–15)
BUN: 14 mg/dL (ref 8–23)
CO2: 30 mmol/L (ref 22–32)
Calcium: 9.1 mg/dL (ref 8.9–10.3)
Chloride: 104 mmol/L (ref 98–111)
Creatinine, Ser: 0.99 mg/dL (ref 0.61–1.24)
GFR, Estimated: >60 mL/min (ref >60)
Glucose, Bld: 102 mg/dL — ABNORMAL HIGH (ref 70–99)
Potassium: 3.9 mmol/L (ref 3.5–5.1)
Sodium: 136 mmol/L (ref 135–145)
Total Bilirubin: 0.6 mg/dL (ref 0.3–1.2)
Total Protein: 6.5 g/dL (ref 6.5–8.1)

## 2020-04-02 LAB — RESP PANEL BY RT-PCR (FLU A&B, COVID) ARPGX2
Influenza A by PCR: NEGATIVE
Influenza B by PCR: NEGATIVE
SARS Coronavirus 2 by RT PCR: NEGATIVE

## 2020-04-02 LAB — BRAIN NATRIURETIC PEPTIDE: B Natriuretic Peptide: 18.4 pg/mL (ref 0.0–100.0)

## 2020-04-02 MED ORDER — ALBUTEROL SULFATE HFA 108 (90 BASE) MCG/ACT IN AERS: INHALATION_SPRAY | RESPIRATORY_TRACT | 0 refills | Status: DC

## 2020-04-02 MED ORDER — PREDNISONE 10 MG PO TABS: ORAL_TABLET | ORAL | 0 refills | Status: DC

## 2020-04-02 MED ORDER — IPRATROPIUM-ALBUTEROL 0.5-2.5 (3) MG/3ML IN SOLN
3.0000 mL | Freq: Once | RESPIRATORY_TRACT | Status: AC
  Administered 2020-04-02: 3 mL via RESPIRATORY_TRACT
  Filled 2020-04-02: qty 3

## 2020-04-02 MED ORDER — METHYLPREDNISOLONE SODIUM SUCC 125 MG IJ SOLR
60.0000 mg | Freq: Once | INTRAMUSCULAR | Status: AC
  Administered 2020-04-02: 60 mg via INTRAVENOUS
  Filled 2020-04-02: qty 2

## 2020-04-02 MED ORDER — ASPIRIN 81 MG PO CHEW
324.0000 mg | CHEWABLE_TABLET | Freq: Once | ORAL | Status: AC
  Administered 2020-04-02: 324 mg via ORAL
  Filled 2020-04-02: qty 4

## 2020-04-02 NOTE — ED Provider Notes (Signed)
[facility] Emergency Department Provider Note  ____________________________________________   Event Date/Time   First MD Initiated Contact with Patient 04/02/20 0258     (approximate)  I have reviewed the triage vital signs and the nursing notes.   HISTORY  Chief Complaint Shortness of Breath and Chest Pain    HPI Carlos Nelson is a 70 y.o. male with a history notable for severe COPD but on no oxygen at baseline but with frequent visits to the emergency department occasionally prior to mission.  He presents tonight by EMS for acute shortness of breath as well as acute onset heavy chest pain.  He said that his shortness of breath has been getting worse over the last 24 hours or so, but he woke up just prior to calling 911 with chest pain/heaviness, sweating, and worsening shortness of breath.  Nothing in particular made it feel better or worse.  EMS gave him a DuoNeb which helped a little bit.  He said he has been trying his breathing treatments at home.  He finished his steroids about a week ago and was last discharged from this hospital about 2 weeks ago.  He continues to smoke.  He said that he is used to feeling shortness of breath but the chest pain is new or different for him.  He has no focal numbness nor weakness.  He denies nausea and vomiting.  He denies fever.  He said he has received a COVID-19 vaccination.         Past Medical History:  Diagnosis Date  . Chronic respiratory failure with hypoxia ([place])   . COPD (chronic obstructive pulmonary disease) ([place])   . Hypertension   . Schizophrenia [facility])     Patient Active Problem List   Diagnosis Date Noted  . Protein-calorie malnutrition, severe 03/18/2020  . COPD with acute exacerbation ([place]) 03/10/2020  . Right-sided chest pain 03/10/2020  . HLD (hyperlipidemia) 01/18/2020  . CAD (coronary artery disease) 01/18/2020  . Hypertensive urgency 01/18/2020  . Tobacco abuse 01/18/2020  . Scrotum  pain   . Transaminitis   . Demand ischemia ([place]) 06/11/2019  . Malnutrition of moderate degree 06/10/2019  . NSTEMI (non-ST elevated myocardial infarction) ([place]) 06/09/2019  . Depression with anxiety 06/09/2019  . Pleuritic chest pain 05/20/2019  . CAP (community acquired pneumonia) 05/20/2019  . Hypertension   . COPD exacerbation ([place])   . Acute respiratory failure ([place])   . Altered mental status 09/25/2018  . Schizophrenia ([place])   . Tachycardia 08/21/2018  . Schizophrenia, chronic with acute exacerbation ([place]) 08/17/2018  . Elevated PSA 05/24/2015    Past Surgical History:  Procedure Laterality Date  . COLONOSCOPY    . COLONOSCOPY WITH PROPOFOL N/A 12/13/2016   Procedure: COLONOSCOPY WITH PROPOFOL;  Surgeon: Lollie Sails, MD;  Location: [facility] ENDOSCOPY;  Service: Endoscopy;  Laterality: N/A;  . ESOPHAGOGASTRODUODENOSCOPY (EGD) WITH PROPOFOL N/A 12/13/2016   Procedure: ESOPHAGOGASTRODUODENOSCOPY (EGD) WITH PROPOFOL;  Surgeon: Lollie Sails, MD;  Location: [facility] ENDOSCOPY;  Service: Endoscopy;  Laterality: N/A;  . left arm surgery      Prior to Admission medications   Medication Sig Start Date End Date Taking? Authorizing Provider  albuterol (VENTOLIN HFA) 108 (90 Base) MCG/ACT inhaler Inhale 2-4 puffs by mouth every 4 hours as needed for wheezing, cough, and/or shortness of breath 04/02/20  Yes Hinda Kehr, MD  predniSONE (DELTASONE) 10 MG tablet Take 4 tabs (40 mg) PO x 3 days, then take 2 tabs (20 mg) PO  x 3 days, then take 1 tab (10 mg) PO x 3 days, then take 1/2 tab (5 mg) PO x 4 days. 04/02/20  Yes Hinda Kehr, MD  amlodipine-olmesartan (AZOR) 10-20 MG tablet Take 1 tablet by mouth daily. 03/23/20   Loel Dubonnet, NP  ARIPiprazole (ABILIFY) 15 MG tablet Take 15 mg by mouth daily. 01/29/20   [provider]  donepezil (ARICEPT) 5 MG tablet Take 5 mg by mouth at bedtime.    [provider]  hydrOXYzine (ATARAX/VISTARIL) 50 MG tablet Take 50 mg by  mouth every 6 (six) hours as needed for anxiety or itching.    [provider]  ipratropium-albuterol (DUONEB) 0.5-2.5 (3) MG/3ML SOLN Take 3 mLs by nebulization every 4 (four) hours for 14 days. 03/11/20 03/25/20  Fritzi Mandes, MD  pantoprazole (PROTONIX) 20 MG tablet Take 1 tablet (20 mg total) by mouth daily. 12/18/19 12/17/20  Lavonia Drafts, MD  QUEtiapine (SEROQUEL) 100 MG tablet Take 100 mg by mouth daily.    [provider]  QUEtiapine (SEROQUEL) 200 MG tablet Take 3 tablets (600 mg total) by mouth at bedtime. 02/03/20   Harvest Dark, MD  QUEtiapine (SEROQUEL) 25 MG tablet Take 25 mg by mouth daily. 02/25/20   [provider]  traZODone (DESYREL) 100 MG tablet Take 100 mg by mouth at bedtime. 02/24/20   [provider]  vitamin B-12 (CYANOCOBALAMIN) 1000 MCG tablet Take 1,000 mcg by mouth daily.    [provider]    Allergies Ace inhibitors  Family History  Problem Relation Age of Onset  . Prostate cancer Neg Hx   . Bladder Cancer Neg Hx     Social History Social History   Tobacco Use  . Smoking status: Current Every Day Smoker    Packs/day: 1.00    Types: Cigarettes  . Smokeless tobacco: Never Used  Substance Use Topics  . Alcohol use: Yes    Alcohol/week: 0.0 standard drinks    Comment: beer  . Drug use: Not Currently    Types: Marijuana    Review of Systems Constitutional: No fever/chills Eyes: No visual changes. ENT: No sore throat. Cardiovascular: Positive for chest heaviness. Respiratory: Positive for shortness of breath. Gastrointestinal: No abdominal pain.  No nausea, no vomiting.  No diarrhea.  No constipation. Genitourinary: Negative for dysuria. Musculoskeletal: Negative for neck pain.  Negative for back pain. Integumentary: Negative for rash. Neurological: Negative for headaches, focal weakness or numbness.   ____________________________________________   PHYSICAL EXAM:  VITAL SIGNS: ED Triage Vitals   Enc Vitals Group     BP 04/02/20 0303 (!) 158/97     Pulse Rate 04/02/20 0303 95     Resp 04/02/20 0303 (!) 26     Temp 04/02/20 0303 98 F (36.7 C)     Temp src --      SpO2 04/02/20 0303 97 %     Weight 04/02/20 0304 59 kg (130 lb)     Height --      Head Circumference --      Peak Flow --      Pain Score 04/02/20 0303 6     Pain Loc --      Pain Edu? --      Excl. in [place]? --     Constitutional: Alert and oriented.  Eyes: Conjunctivae are normal.  Head: Atraumatic. Nose: No congestion/rhinnorhea. Mouth/Throat: Patient is wearing a mask. Neck: No stridor.  No meningeal signs.   Cardiovascular: Normal rate, regular rhythm. Good  peripheral circulation. Respiratory: Positive for retractions and accessory muscle usage.  He is moving minimal air even with deep respiration and there are no audible wheezes but very minimal air movement. Gastrointestinal: Thin and muscular body habitus.  Soft and nontender. No distention.  Musculoskeletal: No lower extremity tenderness nor edema. No gross deformities of extremities. Neurologic:  Normal speech and language. No gross focal neurologic deficits are appreciated.  Skin:  Skin is warm, dry and intact. Psychiatric: Mood and affect are normal. Speech and behavior are normal.  ____________________________________________   LABS (all labs ordered are listed, but only abnormal results are displayed)  Labs Reviewed  COMPREHENSIVE METABOLIC PANEL - Abnormal; Notable for the following components:      Result Value   Glucose, Bld 102 (*)    Anion gap 2 (*)    All other components within normal limits  RESP PANEL BY RT-PCR (FLU A&B, COVID) ARPGX2  CBC WITH DIFFERENTIAL/PLATELET  BRAIN NATRIURETIC PEPTIDE  LIPASE, BLOOD  TROPONIN I (HIGH SENSITIVITY)  TROPONIN I (HIGH SENSITIVITY)   ____________________________________________  EKG  ED ECG REPORT I, Hinda Kehr, the attending physician, personally viewed and interpreted this  ECG.  Date: 04/02/2020 EKG Time: 2:55 AM Rate: 94 Rhythm: normal sinus rhythm QRS Axis: Right axis deviation Intervals: normal ST/T Wave abnormalities: Non-specific ST segment / T-wave changes, but no clear evidence of acute ischemia. Narrative Interpretation: no definitive evidence of acute ischemia; does not meet STEMI criteria.   ____________________________________________  RADIOLOGY I, Hinda Kehr, personally viewed and evaluated these images (plain radiographs) as part of my medical decision making, as well as reviewing the written report by the radiologist.  ED MD interpretation:  No acute abnormalities  Official radiology report(s): DG Chest Portable 1 View  Result Date: 04/02/2020 CLINICAL DATA:  70 year old male with shortness of breath. EXAM: PORTABLE CHEST 1 VIEW COMPARISON:  Chest radiograph dated 03/16/2020. FINDINGS: Assess mass changes of the lungs. No focal consolidation, pleural effusion, or pneumothorax. The cardiac silhouette is within limits. No acute osseous pathology. IMPRESSION: No active disease. Electronically Signed   By: Anner Crete M.D.   On: 04/02/2020 03:48    ____________________________________________   PROCEDURES   Procedure(s) performed (including Critical Care):  .1-3 Lead EKG Interpretation Performed by: Hinda Kehr, MD Authorized by: Hinda Kehr, MD     Interpretation: normal     ECG rate:  95   ECG rate assessment: normal     Rhythm: sinus rhythm     Ectopy: none     Conduction: normal       ____________________________________________   INITIAL IMPRESSION / MDM / ASSESSMENT AND PLAN / ED COURSE  As part of my medical decision making, I reviewed the following data within the [place] notes reviewed and incorporated, Labs reviewed , EKG interpreted , Old chart reviewed, Radiograph reviewed  and Notes from prior ED visits   Differential diagnosis includes, but is not limited to, COPD  exacerbation, pneumonia, PE, ACS, pneumothorax.  The patient is on the cardiac monitor to evaluate for evidence of arrhythmia and/or significant heart rate changes.  Patient is moving minimal air and is retracting and using accessory muscles.  I ordered 3 duo nebs and Solu-Medrol 60 mg IV.  Patient required BiPAP in the past but I do not think he is at that point yet tonight.  No evidence of ischemia on EKG.  Lab work pending.  Patient on 2 L of oxygen for comfort.  Clinical Course as of 04/02/20  6948  Sat Apr 02, 2020  0354 I personally reviewed the patient's imaging and agree with the radiologist's interpretation that there is no evidence of pneumothorax or other emergent medical condition.  Chronic COPD. [CF]  0407 B Natriuretic Peptide: 18.4 [CF]  0446 SARS Coronavirus 2 by RT PCR: NEGATIVE [CF]  5462 The patient says he is feeling much better and can go home.  However we are awaiting the results of his second high-sensitivity troponin.  I am giving him a full dose aspirin but he says he feels better overall and does feel like he can go home and follow-up as an outpatient.  I am putting him back on a prednisone prescription, this time a taper. [CF]  [id number] Troponin I (High Sensitivity) Reassuring second troponin.  Patient continues to feel better.  Will discharge for outpatient follow-up as previously described. [CF]    Clinical Course User Index [CF] Hinda Kehr, MD      ____________________________________________  FINAL CLINICAL IMPRESSION(S) / ED DIAGNOSES  Final diagnoses:  COPD exacerbation ([place])     MEDICATIONS GIVEN DURING THIS VISIT:  Medications  ipratropium-albuterol (DUONEB) 0.5-2.5 (3) MG/3ML nebulizer solution 3 mL (3 mLs Nebulization Given 04/02/20 0335)  ipratropium-albuterol (DUONEB) 0.5-2.5 (3) MG/3ML nebulizer solution 3 mL (3 mLs Nebulization Given 04/02/20 0335)  ipratropium-albuterol (DUONEB) 0.5-2.5 (3) MG/3ML nebulizer solution 3 mL (3 mLs Nebulization  Given 04/02/20 0335)  methylPREDNISolone sodium succinate (SOLU-MEDROL) 125 mg/2 mL injection 60 mg (60 mg Intravenous Given 04/02/20 0338)  aspirin chewable tablet 324 mg (324 mg Oral Given 04/02/20 0600)     ED Discharge Orders         Ordered    predniSONE (DELTASONE) 10 MG tablet        04/02/20 0556    albuterol (VENTOLIN HFA) 108 (90 Base) MCG/ACT inhaler       Note to Pharmacy: Pharmacy may substitute brand and size for insurance-approved equivalent   04/02/20 0556          *Please note:  OKIE BOGACZ was evaluated in Emergency Department on 04/02/2020 for the symptoms described in the history of present illness. He was evaluated in the context of the global COVID-19 pandemic, which necessitated consideration that the patient might be at risk for infection with the SARS-CoV-2 virus that causes COVID-19. Institutional protocols and algorithms that pertain to the evaluation of patients at risk for COVID-19 are in a state of rapid change based on information released by regulatory bodies including the CDC and federal and state organizations. These policies and algorithms were followed during the patient's care in the ED.  Some ED evaluations and interventions may be delayed as a result of limited staffing during and after the pandemic.*  Note:  This document was prepared using Dragon voice recognition software and may include unintentional dictation errors.   Hinda Kehr, MD 04/02/20 [phone]

## 2020-04-02 NOTE — Discharge Instructions (Addendum)
We believe that your symptoms are caused today by an exacerbation of your COPD.  Please take the prescribed medications and any medications that you have at home for your COPD.  Follow up with your doctor as recommended.  If you develop any new or worsening symptoms, including but not limited to fever, persistent vomiting, worsening shortness of breath, or other symptoms that concern you, please return to the Emergency Department immediately.  

## 2020-04-02 NOTE — ED Notes (Signed)
Pt's caregiver from [place] here to pick him up.

## 2020-04-02 NOTE — ED Triage Notes (Signed)
70 y/o male arrived to the [facility] by EMS coming from home with a CC of SOB and chest pain. Pt states he took his breathing treatment due to him being SOB all yesterday. Pt notes he isnt normally on oxygen at home but PMH of COPD. EMS states pt O2 SATwas in the 90s. EMS notes they gave a duo neb treatment enroute to hospital. Upon assessment pt has belly retractions with each breath. Pt is A&Ox4

## 2020-04-02 NOTE — ED Notes (Signed)
Called Carlos Nelson, administration at [employer]. States that she will pick the pt up in approx 1 hour.

## 2020-04-05 NOTE — Congregational Nurse Program (Signed)
  Dept: [place] Nurse Program Note  Date of Encounter: 03/31/2020  Client in for Blood pressure check VS: 158/92, 107, 96% on room air.   Past Medical History: Past Medical History:  Diagnosis Date  . Chronic respiratory failure with hypoxia ([place])   . COPD (chronic obstructive pulmonary disease) ([place])   . Hypertension   . Schizophrenia [facility])     Encounter Details:  CNP Questionnaire - 04/05/20 1043      Questionnaire   Do you give verbal consent to treat you today? Yes    Visit Setting Church or Organization    Location Patient Served At [facility]    Patient Status Not Applicable   pateint lives in a group home   Medical Provider Yes   [facility]   Insurance Unknown    Intervention --   Blood pressure check   Housing/Utilities --   lives in a group/care home   Transportation Need transportation assistance    Interpersonal Safety --   patient did not report any safety concerns   Food --   pateint lives in a group/care home, food is provided   Medication --   medication is provided at [employer] [facility] per patient   Referrals Other   n/a   Screening Referrals --   n/a   ED Visit Averted --   n/a   Life-Saving Intervention Made --   n/a         Flo Shanks BSN, RN,CHPN Congregational Nurse, [facility]

## 2020-04-07 ENCOUNTER — Other Ambulatory Visit: Payer: Self-pay | Admitting: Nurse Practitioner

## 2020-04-07 ENCOUNTER — Encounter: Payer: Self-pay | Admitting: Nurse Practitioner

## 2020-04-07 ENCOUNTER — Other Ambulatory Visit: Payer: Self-pay

## 2020-04-07 DIAGNOSIS — Z716 Tobacco abuse counseling: Secondary | ICD-10-CM

## 2020-04-07 DIAGNOSIS — F209 Schizophrenia, unspecified: Secondary | ICD-10-CM

## 2020-04-07 DIAGNOSIS — J449 Chronic obstructive pulmonary disease, unspecified: Secondary | ICD-10-CM

## 2020-04-07 DIAGNOSIS — Z515 Encounter for palliative care: Secondary | ICD-10-CM

## 2020-04-07 NOTE — Congregational Nurse Program (Signed)
  Dept: [place] Nurse Program Note  Date of Encounter: 04/07/2020 Client in for blood pressure check BP 180/98, reports he had just taken his medication.  ast Medical History: Past Medical History:  Diagnosis Date  . Chronic respiratory failure with hypoxia ([place])   . COPD (chronic obstructive pulmonary disease) ([place])   . Hypertension   . Schizophrenia [facility])     Encounter Details:  CNP Questionnaire - 04/07/20 1157      Questionnaire   Do you give verbal consent to treat you today? Yes    Visit Setting Church or Organization    Location Patient Served At [facility]    Patient Status Not Applicable    Medical Provider Yes    Insurance Medicaid    Intervention Assess (including screenings)

## 2020-04-07 NOTE — Progress Notes (Signed)
[facility] Palliative Care Consult Note Telephone: [phone]  Fax: [phone]  PATIENT NAME: Carlos Nelson DOB: [birth year] MRN: [id number]  PRIMARY CARE PROVIDER:   [facility]  REFERRING PROVIDER:  [facility] [address] [place],  [place] [address]  RESPONSIBLE PARTY:   Self; Group home [phone] Carlos Nelson   I was asked to see Carlos Nelson by  1. Advance Care Planning; Full code with aggressive interventions  2. Goals of Care: Goals include to maximize quality of life and symptom management. Our advance care planning conversation included a discussion about:     The value and importance of advance care planning   Exploration of personal, cultural or spiritual beliefs that might influence medical decisions   Exploration of goals of care in the event of a sudden injury or illness   Identification and preparation of a healthcare agent   Review and updating or creation of an advance directive document.  3. Palliative care encounter; Palliative care encounter; Palliative medicine team will continue to support patient, patient's family, and medical team. Visit consisted of counseling and education dealing with the complex and emotionally intense issues of symptom management and palliative care in the setting of serious and potentially life-threatening illness  4. f/u 1 month for ongoing monitoring chronic disease progression, ongoing discussions complex medical decision making  5. COPD/wheezing, no sign of acute exacerbation; continue nebulizer and inhalers with Primary to continue to manage  6. Schizophrenia; will complete a PC SW referral to assist in connecting with Mental health services for psychiatry appointment as he has been non-compliant with meds with active hallucinations visual/auditory  7. Tobacco abuse-not interested in quitting. Smoking cessation encouraged. Recommend utilization of  [id number].   I spent 60 minutes providing this consultation,  from 2:15pm to 3:15pm. More than 50% of the time in this consultation was spent coordinating communication.   HISTORY OF PRESENT ILLNESS:  Carlos Nelson is a 70 y.o. year old male with multiple medical problems including schizophrenia, COPD, anxiety, tobacco abuse, hypertension, hyperlipidemia presents today for hospital follow-up.   10/15/2019 ED visit likely COPD exacerbation  12/18/2019 ED visit suspected gastritis started on protonix, carafate, symptoms improved after gi cocktail  01/18/2020 ED visit sob, hypoxic requiring bipap, hypertensive, tachycardic with IV diltiazem, mild respiratory acidosis likely due to non-compliance causing COPD exacerbation  02/03/2020 ED visit chest pain, sob negative troponin, has not had seroquel, non-compliance with meds for COPD  02/20/2020 ED visit chest pain, SOB with troponin unchanged, cxr negative, treat with decadron, duonebs which improved  02/21/2020 to 02/22/2020 ED visit chest pain with SOB, gave additional duonebs, felt better  03/01/2020 ED visit for SOB, received by ema 2 duonebs with solumedrol, felt better when arrived in ED. COPD exacerbation, refilled albuterol inhaler  Hospitalized 03/10/2020 to 03/11/2020 for chest pain, copd exacerbation, hypertensive urgency, right sided chest pain atypical likely due to increase work of breathing, EKG non-acute.   Hospitalized 03/16/2020 to 03/18/2020 for COPD with acute exacerbation. Workup acute hypoxic respiratory failure.   I called Carlos Nelson to confirm PC appointment and negative covid screening. Carlos Nelson gave background information, to her knowledge Carlos Nelson has not been married, no children, has a sister who brings him cigarettes. He resides in a group home with 5 total residents. Carlos Nelson is ambulatory, dresses, bathes himself. Carlos Nelson feeds himself with good appetite. No recent falls. Multiple hospitalizations and ED visits. Carlos Nelson  endorses it has become difficult to  obtain Carlos Nelson psychiatric services and increase in non-complance. Carlos Nelson endorses she took for psychiatry services and only received medical treatments. Carlos Nelson endorses Carlos Nelson does have auditory and visual hallucinations, fights with his "imaginary friend". Happens more in the evening time. Explained PC consult. We talked about getting PC SW involved to connect Carlos Nelson with mental health services. We talked about Medical goals of care, wishes are for aggressive interventions including full code. We talked about decision making, Carlos Nelson endorses she helps Carlos Nelson with decision making. Carlos Nelson endorses there will be a caretaker at the home. I visited Carlos Nelson lin his home on the porch. We talked about how he was feeling today. Carlos Nelson endorses he is doing okay, needed to get a breathing treatment, asked to go inside. We walked inside group home, Carlos Nelson turned on his nebulizer and used about 10 minutes endorses he was feeling better. O2 sats at that time 86% improved to 95%. We talked about symptoms of pain which Carlos Nelson denies at present time. We talked about sob with wheezing with does happen, worse with smoking which he actively does. Carlos Nelson declines smoking cessation. Attempted to talk about life review. Carlos Nelson did make eye contact, would engage in conversation but difficulty at times understanding words as he frequently mumbled. Carlos Nelson appears comfortable. We talked about food he likes to eat such as pinto beans, chicken. We talked about appetite which Carlos Nelson endorses is "good". We talked about residing at group home. We talked about tv programs he watches but he was vague as not able to recall names. Attempted life review, Carlos Nelson endorses he has been married once, very long time ago and "too young". Carlos Nelson endorses he has one daughter, although this was not the information Carlos Nelson shared. Will need to further  explore. Carlos Nelson was pleasant, very cooperative, expressed he was thankful for the visit. Mr. Cantara endorses he spend time in the morning in church. Therapeutic listening, emotional support provided. I talked with Clance Boll caretaker at the group home, talked about Carlos Nelson hallucinations, good appetite, smoking. Discussed role PC in poc. F/u PC visit scheduled in 1 month. Will do PC SW consult to further explore family situation, guardian, mental health for psychiatry. Questions answered. Contact information provided.   Palliative Care was asked to help address goals of care.   CODE STATUS: full code  PPS: 50% HOSPICE ELIGIBILITY/DIAGNOSIS: TBD  PAST MEDICAL HISTORY:  Past Medical History:  Diagnosis Date  . Chronic respiratory failure with hypoxia ([place])   . COPD (chronic obstructive pulmonary disease) ([place])   . Hypertension   . Schizophrenia ([place])     SOCIAL HX:  Social History   Tobacco Use  . Smoking status: Current Every Day Smoker    Packs/day: 1.00    Types: Cigarettes  . Smokeless tobacco: Never Used  Substance Use Topics  . Alcohol use: Yes    Alcohol/week: 0.0 standard drinks    Comment: beer    ALLERGIES:  Allergies  Allergen Reactions  . Ace Inhibitors Swelling    Ok with benazapril, per grouphome     PERTINENT MEDICATIONS:  Outpatient Encounter Medications as of 04/07/2020  Medication Sig  . albuterol (VENTOLIN HFA) 108 (90 Base) MCG/ACT inhaler Inhale 2-4 puffs by mouth every 4 hours as needed for wheezing, cough, and/or shortness of breath  . amlodipine-olmesartan (AZOR) 10-20 MG tablet Take 1 tablet by mouth daily.  . ARIPiprazole (ABILIFY) 15 MG  tablet Take 15 mg by mouth daily.  Marland Kitchen donepezil (ARICEPT) 5 MG tablet Take 5 mg by mouth at bedtime.  . hydrOXYzine (ATARAX/VISTARIL) 50 MG tablet Take 50 mg by mouth every 6 (six) hours as needed for anxiety or itching.  Marland Kitchen ipratropium-albuterol (DUONEB) 0.5-2.5 (3) MG/3ML SOLN Take 3 mLs by nebulization every  4 (four) hours for 14 days.  . pantoprazole (PROTONIX) 20 MG tablet Take 1 tablet (20 mg total) by mouth daily.  . predniSONE (DELTASONE) 10 MG tablet Take 4 tabs (40 mg) PO x 3 days, then take 2 tabs (20 mg) PO x 3 days, then take 1 tab (10 mg) PO x 3 days, then take 1/2 tab (5 mg) PO x 4 days.  Marland Kitchen QUEtiapine (SEROQUEL) 100 MG tablet Take 100 mg by mouth daily.  . QUEtiapine (SEROQUEL) 200 MG tablet Take 3 tablets (600 mg total) by mouth at bedtime.  Marland Kitchen QUEtiapine (SEROQUEL) 25 MG tablet Take 25 mg by mouth daily.  . traZODone (DESYREL) 100 MG tablet Take 100 mg by mouth at bedtime.  . vitamin B-12 (CYANOCOBALAMIN) 1000 MCG tablet Take 1,000 mcg by mouth daily.   No facility-administered encounter medications on file as of 04/07/2020.    PHYSICAL EXAM:  03/23/20 130 lb (59 kg)  03/18/20 125 lb 3.5 oz (56.8 kg)  03/11/20 124 lb (56.2 kg)   VSS: O2 sats 95%, respirations 20, pulse 88 General: NAD, mildly cognitively impaired pleasant male Cardiovascular: regular rate and rhythm Pulmonary: clear ant fields Abdomen: soft, nontender, + bowel sounds Extremities: no edema, no joint deformities Neurological: ambulatory  Christin Ihor Gully, NP

## 2020-04-11 ENCOUNTER — Other Ambulatory Visit: Payer: Self-pay

## 2020-04-11 ENCOUNTER — Emergency Department: Payer: Medicare Other

## 2020-04-11 DIAGNOSIS — I2510 Atherosclerotic heart disease of native coronary artery without angina pectoris: Secondary | ICD-10-CM | POA: Diagnosis not present

## 2020-04-11 DIAGNOSIS — R140 Abdominal distension (gaseous): Secondary | ICD-10-CM | POA: Insufficient documentation

## 2020-04-11 DIAGNOSIS — J449 Chronic obstructive pulmonary disease, unspecified: Secondary | ICD-10-CM | POA: Insufficient documentation

## 2020-04-11 DIAGNOSIS — F17210 Nicotine dependence, cigarettes, uncomplicated: Secondary | ICD-10-CM | POA: Insufficient documentation

## 2020-04-11 DIAGNOSIS — K5900 Constipation, unspecified: Secondary | ICD-10-CM | POA: Insufficient documentation

## 2020-04-11 DIAGNOSIS — R109 Unspecified abdominal pain: Secondary | ICD-10-CM | POA: Diagnosis present

## 2020-04-11 DIAGNOSIS — Z79899 Other long term (current) drug therapy: Secondary | ICD-10-CM | POA: Diagnosis not present

## 2020-04-11 DIAGNOSIS — I10 Essential (primary) hypertension: Secondary | ICD-10-CM | POA: Insufficient documentation

## 2020-04-11 DIAGNOSIS — Z7951 Long term (current) use of inhaled steroids: Secondary | ICD-10-CM | POA: Diagnosis not present

## 2020-04-11 LAB — CBC
HCT: 46.6 % (ref 39.0–52.0)
Hemoglobin: 15.7 g/dL (ref 13.0–17.0)
MCH: 30.3 pg (ref 26.0–34.0)
MCHC: 33.7 g/dL (ref 30.0–36.0)
MCV: 90 fL (ref 80.0–100.0)
Platelets: 221 10*3/uL (ref 150–400)
RBC: 5.18 MIL/uL (ref 4.22–5.81)
RDW: 13.2 % (ref 11.5–15.5)
WBC: 8.4 10*3/uL (ref 4.0–10.5)
nRBC: 0 % (ref 0.0–0.2)

## 2020-04-11 NOTE — ED Triage Notes (Signed)
Pt in with co abd distention states started today. Denies any hx of the same, did have BM today. Also co chest pain all day.

## 2020-04-11 NOTE — ED Notes (Signed)
Pt brought in by [facility] for abd distention, VS wnl.

## 2020-04-12 ENCOUNTER — Emergency Department: Payer: Medicare Other

## 2020-04-12 ENCOUNTER — Emergency Department
Admission: EM | Admit: 2020-04-12 | Discharge: 2020-04-12 | Disposition: A | Discharge status: Home / Self Care | Payer: Medicare Other | Attending: Emergency Medicine | Admitting: Emergency Medicine

## 2020-04-12 ENCOUNTER — Encounter: Payer: Self-pay | Admitting: Radiology

## 2020-04-12 DIAGNOSIS — R140 Abdominal distension (gaseous): Secondary | ICD-10-CM

## 2020-04-12 DIAGNOSIS — R0609 Other forms of dyspnea: Secondary | ICD-10-CM

## 2020-04-12 DIAGNOSIS — K5900 Constipation, unspecified: Secondary | ICD-10-CM

## 2020-04-12 DIAGNOSIS — J449 Chronic obstructive pulmonary disease, unspecified: Secondary | ICD-10-CM

## 2020-04-12 LAB — TROPONIN I (HIGH SENSITIVITY)
Troponin I (High Sensitivity): 9 ng/L (ref <18)
Troponin I (High Sensitivity): 10 ng/L (ref <18)

## 2020-04-12 LAB — COMPREHENSIVE METABOLIC PANEL
ALT: 29 U/L (ref 0–44)
AST: 32 U/L (ref 15–41)
Albumin: 4 g/dL (ref 3.5–5.0)
Alkaline Phosphatase: 92 U/L (ref 38–126)
Anion gap: 7 (ref 5–15)
BUN: 15 mg/dL (ref 8–23)
CO2: 30 mmol/L (ref 22–32)
Calcium: 9.3 mg/dL (ref 8.9–10.3)
Chloride: 101 mmol/L (ref 98–111)
Creatinine, Ser: 0.97 mg/dL (ref 0.61–1.24)
GFR, Estimated: >60 mL/min (ref >60)
Glucose, Bld: 139 mg/dL — ABNORMAL HIGH (ref 70–99)
Potassium: 4 mmol/L (ref 3.5–5.1)
Sodium: 138 mmol/L (ref 135–145)
Total Bilirubin: 0.7 mg/dL (ref 0.3–1.2)
Total Protein: 6.9 g/dL (ref 6.5–8.1)

## 2020-04-12 LAB — LIPASE, BLOOD: Lipase: 40 U/L (ref 11–51)

## 2020-04-12 MED ORDER — POLYETHYLENE GLYCOL 3350 17 GM/SCOOP PO POWD: 17.0000 g | Freq: Every day | ORAL | 0 refills | Status: DC | PRN

## 2020-04-12 MED ORDER — IPRATROPIUM-ALBUTEROL 0.5-2.5 (3) MG/3ML IN SOLN
3.0000 mL | Freq: Once | RESPIRATORY_TRACT | Status: AC
  Administered 2020-04-12: 3 mL via RESPIRATORY_TRACT
  Filled 2020-04-12: qty 3

## 2020-04-12 MED ORDER — DOCUSATE SODIUM 100 MG PO CAPS
100.0000 mg | ORAL_CAPSULE | Freq: Once | ORAL | Status: AC
  Administered 2020-04-12: 100 mg via ORAL
  Filled 2020-04-12: qty 1

## 2020-04-12 MED ORDER — DOCUSATE SODIUM 100 MG PO CAPS: 100.0000 mg | ORAL_CAPSULE | Freq: Two times a day (BID) | ORAL | 0 refills | Status: DC

## 2020-04-12 MED ORDER — IOHEXOL 9 MG/ML PO SOLN
500.0000 mL | ORAL | Status: AC
  Administered 2020-04-12 (×2): 500 mL via ORAL

## 2020-04-12 MED ORDER — DEXAMETHASONE 10 MG/ML FOR PEDIATRIC ORAL USE
10.0000 mg | Freq: Once | INTRAMUSCULAR | Status: AC
  Administered 2020-04-12: 10 mg via ORAL
  Filled 2020-04-12: qty 1

## 2020-04-12 MED ORDER — IOHEXOL 300 MG/ML  SOLN
75.0000 mL | Freq: Once | INTRAMUSCULAR | Status: AC | PRN
  Administered 2020-04-12: 75 mL via INTRAVENOUS

## 2020-04-12 NOTE — ED Notes (Signed)
PO contrast complete, CT called.

## 2020-04-12 NOTE — Discharge Instructions (Addendum)
Please take your medications as prescribed to help for constipation.  If you begin having diarrhea please discontinue these medications.  Please follow-up with your doctor for further evaluation.  Your CT scan does show an enlarged prostate please follow-up with your doctor regarding this.  Return to the emergency department for any abdominal pain or development of fever, or any other symptom personally concerning to yourself.

## 2020-04-12 NOTE — ED Provider Notes (Signed)
[facility] Emergency Department Provider Note  ____________________________________________   Event Date/Time   First MD Initiated Contact with Patient 04/12/20 0430     (approximate)  I have reviewed the triage vital signs and the nursing notes.   HISTORY  Chief Complaint Abdominal Pain    HPI Carlos Nelson is a 70 y.o. male well-known to the emergency department with frequent visits, typically associated with his COPD.  He presents tonight with multiple complaints including shortness of breath, chest pain, and abdominal distention.  He said that his abdomen does not really hurt but that it feels "full".  He said that sometimes over the last week when he eats he vomits but not always.  He said he has been trying to eat and drink normal amounts.  He said that his chest has occasional sharp pain but it feels the same as usual.  He claims that he is still taking the prednisone taper that I prescribed for him about a week and a half ago but he says that he ran out of albuterol so he has not had a breathing treatment for more than a day.  He describes his symptoms as severe.  Nothing particular makes them better or worse.  He denies fever, current nausea, and dysuria.         Past Medical History:  Diagnosis Date  . Chronic respiratory failure with hypoxia ([place])   . COPD (chronic obstructive pulmonary disease) ([place])   . Hypertension   . Schizophrenia [facility])     Patient Active Problem List   Diagnosis Date Noted  . Protein-calorie malnutrition, severe 03/18/2020  . COPD with acute exacerbation ([place]) 03/10/2020  . Right-sided chest pain 03/10/2020  . HLD (hyperlipidemia) 01/18/2020  . CAD (coronary artery disease) 01/18/2020  . Hypertensive urgency 01/18/2020  . Tobacco abuse 01/18/2020  . Scrotum pain   . Transaminitis   . Demand ischemia ([place]) 06/11/2019  . Malnutrition of moderate degree 06/10/2019  . NSTEMI (non-ST elevated myocardial  infarction) ([place]) 06/09/2019  . Depression with anxiety 06/09/2019  . Pleuritic chest pain 05/20/2019  . CAP (community acquired pneumonia) 05/20/2019  . Hypertension   . COPD exacerbation ([place])   . Acute respiratory failure ([place])   . Altered mental status 09/25/2018  . Schizophrenia ([place])   . Tachycardia 08/21/2018  . Schizophrenia, chronic with acute exacerbation ([place]) 08/17/2018  . Elevated PSA 05/24/2015    Past Surgical History:  Procedure Laterality Date  . COLONOSCOPY    . COLONOSCOPY WITH PROPOFOL N/A 12/13/2016   Procedure: COLONOSCOPY WITH PROPOFOL;  Surgeon: Lollie Sails, MD;  Location: [facility] ENDOSCOPY;  Service: Endoscopy;  Laterality: N/A;  . ESOPHAGOGASTRODUODENOSCOPY (EGD) WITH PROPOFOL N/A 12/13/2016   Procedure: ESOPHAGOGASTRODUODENOSCOPY (EGD) WITH PROPOFOL;  Surgeon: Lollie Sails, MD;  Location: [facility] ENDOSCOPY;  Service: Endoscopy;  Laterality: N/A;  . left arm surgery      Prior to Admission medications   Medication Sig Start Date End Date Taking? Authorizing Provider  albuterol (VENTOLIN HFA) 108 (90 Base) MCG/ACT inhaler Inhale 2-4 puffs by mouth every 4 hours as needed for wheezing, cough, and/or shortness of breath 04/02/20   Hinda Kehr, MD  amlodipine-olmesartan (AZOR) 10-20 MG tablet Take 1 tablet by mouth daily. 03/23/20   Loel Dubonnet, NP  ARIPiprazole (ABILIFY) 15 MG tablet Take 15 mg by mouth daily. 01/29/20   [provider]  donepezil (ARICEPT) 5 MG tablet Take 5 mg by mouth at bedtime.    [provider]  hydrOXYzine (ATARAX/VISTARIL) 50 MG tablet Take 50 mg by mouth every 6 (six) hours as needed for anxiety or itching.    [provider]  ipratropium-albuterol (DUONEB) 0.5-2.5 (3) MG/3ML SOLN Take 3 mLs by nebulization every 4 (four) hours for 14 days. 03/11/20 03/25/20  Fritzi Mandes, MD  pantoprazole (PROTONIX) 20 MG tablet Take 1 tablet (20 mg total) by mouth daily. 12/18/19 12/17/20  Lavonia Drafts, MD   predniSONE (DELTASONE) 10 MG tablet Take 4 tabs (40 mg) PO x 3 days, then take 2 tabs (20 mg) PO x 3 days, then take 1 tab (10 mg) PO x 3 days, then take 1/2 tab (5 mg) PO x 4 days. 04/02/20   Hinda Kehr, MD  QUEtiapine (SEROQUEL) 100 MG tablet Take 100 mg by mouth daily.    [provider]  QUEtiapine (SEROQUEL) 200 MG tablet Take 3 tablets (600 mg total) by mouth at bedtime. 02/03/20   Harvest Dark, MD  QUEtiapine (SEROQUEL) 25 MG tablet Take 25 mg by mouth daily. 02/25/20   [provider]  traZODone (DESYREL) 100 MG tablet Take 100 mg by mouth at bedtime. 02/24/20   [provider]  vitamin B-12 (CYANOCOBALAMIN) 1000 MCG tablet Take 1,000 mcg by mouth daily.    [provider]    Allergies Ace inhibitors  Family History  Problem Relation Age of Onset  . Prostate cancer Neg Hx   . Bladder Cancer Neg Hx     Social History Social History   Tobacco Use  . Smoking status: Current Every Day Smoker    Packs/day: 1.00    Types: Cigarettes  . Smokeless tobacco: Never Used  Substance Use Topics  . Alcohol use: Yes    Alcohol/week: 0.0 standard drinks    Comment: beer  . Drug use: Not Currently    Types: Marijuana    Review of Systems Constitutional: No fever/chills Eyes: No visual changes. ENT: No sore throat. Cardiovascular: Positive for chest pain. Respiratory: Positive for shortness of breath. Gastrointestinal: Patient denies abdominal pain but reports abdominal distention.  He said that sometimes he has some nausea and vomiting over the last week but does not currently feel nauseated. Genitourinary: Negative for dysuria. Musculoskeletal: Negative for neck pain.  Negative for back pain. Integumentary: Negative for rash. Neurological: Negative for headaches, focal weakness or numbness.   ____________________________________________   PHYSICAL EXAM:  VITAL SIGNS: ED Triage Vitals  Enc Vitals Group     BP 04/11/20 2324 139/90      Pulse Rate 04/11/20 2324 (!) 110     Resp 04/11/20 2324 20     Temp 04/11/20 2324 (!) 97.5 F (36.4 C)     Temp Source 04/11/20 2324 Oral     SpO2 04/11/20 2324 97 %     Weight 04/11/20 2325 59 kg (130 lb)     Height 04/11/20 2325 1.626 m (5\' 4" )     Head Circumference --      Peak Flow --      Pain Score 04/11/20 2325 8     Pain Loc --      Pain Edu? --      Excl. in [place]? --     Constitutional: Alert and oriented.  Appears chronically ill but currently is not in respiratory distress. Eyes: Conjunctivae are normal.  Head: Atraumatic. Nose: No congestion/rhinnorhea. Mouth/Throat: Patient is wearing a mask. Neck: No stridor.  No meningeal signs.   Cardiovascular: Mild tachycardia, regular rhythm. Good peripheral circulation. Respiratory:  Increased respiratory effort with some retractions, expiratory wheezing throughout lung fields but with good air movement. Gastrointestinal: Soft and nontender.  Possibly some mild distention but the patient is also flexing his muscles underneath, and impressing his stomach out when I palpate. Musculoskeletal: No lower extremity tenderness nor edema. No gross deformities of extremities. Neurologic:  Normal speech and language. No gross focal neurologic deficits are appreciated.  Skin:  Skin is warm, dry and intact. Psychiatric: Mood and affect are normal. Speech and behavior are normal.  Patient appears to be at his baseline.  ____________________________________________   LABS (all labs ordered are listed, but only abnormal results are displayed)  Labs Reviewed  COMPREHENSIVE METABOLIC PANEL - Abnormal; Notable for the following components:      Result Value   Glucose, Bld 139 (*)    All other components within normal limits  CBC  LIPASE, BLOOD  TROPONIN I (HIGH SENSITIVITY)  TROPONIN I (HIGH SENSITIVITY)   ____________________________________________  EKG  ED ECG REPORT I, Hinda Kehr, the attending physician, personally viewed  and interpreted this ECG.  Date: 04/11/2020 EKG Time: 23: 29 Rate: 112 Rhythm: Sinus tachycardia QRS Axis: normal Intervals: Probable right ventricular hypertrophy with pulmonary disease pattern ST/T Wave abnormalities: Non-specific ST segment / T-wave changes, but no clear evidence of acute ischemia. Narrative Interpretation: no definitive evidence of acute ischemia; does not meet STEMI criteria.   ____________________________________________  RADIOLOGY I, Hinda Kehr, personally viewed and evaluated these images (plain radiographs) as part of my medical decision making, as well as reviewing the written report by the radiologist.  ED MD interpretation: No acute abnormality identified.  Official radiology report(s): DG Chest 2 View  Result Date: 04/11/2020 CLINICAL DATA:  Chest pain.  Abdominal distention. EXAM: CHEST - 2 VIEW COMPARISON:  Chest radiograph 04/02/2020.  CT 09/14/2019 FINDINGS: Moderately advanced emphysema with chronic hyperinflation. Normal heart size and mediastinal contours. No acute airspace disease. No pleural effusion, pneumothorax, or pulmonary edema. No acute osseous abnormalities are seen. Chronic flattening of many of thoracic and upper lumbar vertebra. There is colonic interposition under the right hemidiaphragm. IMPRESSION: 1. No acute abnormality. 2. Moderately advanced emphysema. Electronically Signed   By: Keith Rake M.D.   On: 04/11/2020 23:54    DG Abd 2 Views  Result Date: 04/12/2020 CLINICAL DATA:  Abdominal distention. EXAM: ABDOMEN - 2 VIEW COMPARISON:  12/18/2019. FINDINGS: Large amount of stool noted throughout the colon. No bowel distention or free air noted. Air noted in the right upper quadrant most likely duodenal air. If symptoms persist CT of the abdomen pelvis can be obtained. Pelvic calcifications consistent phleboliths. Diffuse osteopenia. Degenerative changes lumbar spine and both hips. Mild bibasilar atelectasis. IMPRESSION: 1. Large  amount of stool noted throughout the colon. No bowel distention or free air noted. 2. Air noted in the right upper quadrant most likely duodenal air. If symptoms persist CT of the abdomen pelvis can be obtained. Electronically Signed   By: Marcello Moores  Register   On: 04/12/2020 05:19    ____________________________________________   PROCEDURES   Procedure(s) performed (including Critical Care):  .1-3 Lead EKG Interpretation Performed by: Hinda Kehr, MD Authorized by: Hinda Kehr, MD     Interpretation: normal     ECG rate:  105   ECG rate assessment: normal     Rhythm: sinus tachycardia     Ectopy: none     Conduction: normal       ____________________________________________   INITIAL IMPRESSION / MDM / ASSESSMENT AND PLAN /  ED COURSE  As part of my medical decision making, I reviewed the following data within the [facility] notes reviewed and incorporated, Labs reviewed , EKG interpreted , Old chart reviewed, Radiograph reviewed  and Notes from prior ED visits   Differential diagnosis includes, but is not limited to, COPD, ACS, PE, pneumonia, SBO/ileus, constipation.  The patient is on the cardiac monitor to evaluate for evidence of arrhythmia and/or significant heart rate changes.  Patient appears to be at his baseline based on my history with him and his presentation on prior visits including about a week and half ago.  CBC is normal, lipase is normal, 2 high-sensitivity troponins are within normal limits without any significant delta, and comprehensive metabolic panel is normal.  I personally reviewed the patient's imaging and agree with the radiologist's interpretation that there are no acute abnormalities on the chest x-ray.  He is satting 97 to 100% on room air.  He has severe chronic COPD and he claims he is run out of his albuterol.  He chronically has chest pain.  I am giving him 3 duo nebs and an extra dose of Decadron by mouth so that it will  last longer; I wrote him for a prednisone taper the last time that I cannot be certain he is taking it.  His abdominal exam is reassuring.  He tells me he has had 2 "good" bowel movements today.  He has some vague symptoms of abdominal distention but no tenderness to palpation.  He has not been vomiting recently and he claims that he is able to take oral intake.  I will check an acute abdomen series to look for evidence of bowel obstruction but I think this is unlikely.  Anticipate discharge after breathing treatments with a prescription for albuterol.  Patient understands and agrees with the plan.     Clinical Course as of 04/12/20 0834  Tue Apr 12, 2020  0614 The patient's radiographs are somewhat abnormal.  In addition to a large volume of stool, the patient has a large volume of duodenal air.  The radiologist suggested additional evaluation with CT scan and Given his distention, vague history, and generalized abdominal tenderness, I will proceed with a CT scan of the abdomen pelvis with oral and IV contrast. [CF]  0615 Patient's breathing has improved somewhat with DuoNebs. [CF]  0700 Transferred ED Care to Dr. Kerman Passey to follow up CT scan and reassess. [CF]    Clinical Course User Index [CF] Hinda Kehr, MD     ____________________________________________  FINAL CLINICAL IMPRESSION(S) / ED DIAGNOSES  Final diagnoses:  Abdominal distention  Constipation, unspecified constipation type  Chronic dyspnea  Chronic obstructive pulmonary disease, unspecified COPD type ([place])     MEDICATIONS GIVEN DURING THIS VISIT:  Medications  ipratropium-albuterol (DUONEB) 0.5-2.5 (3) MG/3ML nebulizer solution 3 mL (has no administration in time range)  ipratropium-albuterol (DUONEB) 0.5-2.5 (3) MG/3ML nebulizer solution 3 mL (has no administration in time range)  ipratropium-albuterol (DUONEB) 0.5-2.5 (3) MG/3ML nebulizer solution 3 mL (has no administration in time range)  dexamethasone  (DECADRON) 10 MG/ML injection for Pediatric ORAL use 10 mg (has no administration in time range)     ED Discharge Orders    None      *Please note:  BURL TAUZIN was evaluated in Emergency Department on 04/12/2020 for the symptoms described in the history of present illness. He was evaluated in the context of the global COVID-19 pandemic, which necessitated consideration that the patient  might be at risk for infection with the SARS-CoV-2 virus that causes COVID-19. Institutional protocols and algorithms that pertain to the evaluation of patients at risk for COVID-19 are in a state of rapid change based on information released by regulatory bodies including the CDC and federal and state organizations. These policies and algorithms were followed during the patient's care in the ED.  Some ED evaluations and interventions may be delayed as a result of limited staffing during and after the pandemic.*  Note:  This document was prepared using Dragon voice recognition software and may include unintentional dictation errors.   Hinda Kehr, MD 04/12/20 [phone]

## 2020-04-12 NOTE — ED Provider Notes (Signed)
-----------------------------------------   8:32 AM on 04/12/2020 -----------------------------------------  Patient care assumed from Dr. Karma Greaser.  Patient is work-up is essentially nonrevealing.  Lab work reassuring.  CT scan shows constipation with may be degree of ileus however does note this is unchanged from June 2021 CT.  Patient's LFTs are normal.  Discussed with patient follow-up with PCP for PSA.  CT also noted urinary bladder to be distended however patient states he urinated after the CT and has no discomfort.  Overall patient appears well.  We will discharge with Colace and MiraLAX.  We will have the patient follow-up with his doctor.   Harvest Dark, MD 04/12/20 [phone]

## 2020-04-14 NOTE — [facility] Nurse Program (Signed)
  Dept: [facility] Nurse Program Note  Date of Encounter: 04/14/2020 Client to clinic for blood pressure check. Blood pressure 134/82, 60, 95% on RA. No other needs at this time.    Past Medical History: Past Medical History:  Diagnosis Date  . Chronic respiratory failure with hypoxia ([place])   . COPD (chronic obstructive pulmonary disease) ([place])   . Hypertension   . Schizophrenia [facility])     Encounter Details:  CNP Questionnaire - 04/14/20 1000      Questionnaire   Do you give verbal consent to treat you today? Yes    Visit Setting Church or Organization    Location Patient Served At [facility]    Patient Status Not Applicable    Medical Provider Yes    Insurance Medicaid    Intervention Assess (including screenings)

## 2020-04-21 ENCOUNTER — Other Ambulatory Visit: Payer: Self-pay

## 2020-04-21 ENCOUNTER — Inpatient Hospital Stay
Admission: EM | Admit: 2020-04-21 | Discharge: 2020-04-23 | DRG: 193 | Disposition: A | Discharge status: Home / Self Care | Payer: Medicare Other | Attending: Internal Medicine | Admitting: Internal Medicine

## 2020-04-21 ENCOUNTER — Emergency Department: Payer: Medicare Other

## 2020-04-21 DIAGNOSIS — J441 Chronic obstructive pulmonary disease with (acute) exacerbation: Secondary | ICD-10-CM | POA: Diagnosis present

## 2020-04-21 DIAGNOSIS — J440 Chronic obstructive pulmonary disease with acute lower respiratory infection: Secondary | ICD-10-CM | POA: Diagnosis present

## 2020-04-21 DIAGNOSIS — J9622 Acute and chronic respiratory failure with hypercapnia: Secondary | ICD-10-CM | POA: Diagnosis present

## 2020-04-21 DIAGNOSIS — J9621 Acute and chronic respiratory failure with hypoxia: Secondary | ICD-10-CM | POA: Diagnosis present

## 2020-04-21 DIAGNOSIS — E785 Hyperlipidemia, unspecified: Secondary | ICD-10-CM | POA: Diagnosis present

## 2020-04-21 DIAGNOSIS — Z8701 Personal history of pneumonia (recurrent): Secondary | ICD-10-CM

## 2020-04-21 DIAGNOSIS — Z79899 Other long term (current) drug therapy: Secondary | ICD-10-CM

## 2020-04-21 DIAGNOSIS — R1110 Vomiting, unspecified: Secondary | ICD-10-CM

## 2020-04-21 DIAGNOSIS — F17210 Nicotine dependence, cigarettes, uncomplicated: Secondary | ICD-10-CM | POA: Diagnosis present

## 2020-04-21 DIAGNOSIS — J180 Bronchopneumonia, unspecified organism: Secondary | ICD-10-CM | POA: Diagnosis not present

## 2020-04-21 DIAGNOSIS — Z20822 Contact with and (suspected) exposure to covid-19: Secondary | ICD-10-CM | POA: Diagnosis present

## 2020-04-21 DIAGNOSIS — J9600 Acute respiratory failure, unspecified whether with hypoxia or hypercapnia: Secondary | ICD-10-CM | POA: Diagnosis present

## 2020-04-21 DIAGNOSIS — Z888 Allergy status to other drugs, medicaments and biological substances status: Secondary | ICD-10-CM

## 2020-04-21 DIAGNOSIS — I2510 Atherosclerotic heart disease of native coronary artery without angina pectoris: Secondary | ICD-10-CM | POA: Diagnosis present

## 2020-04-21 DIAGNOSIS — F209 Schizophrenia, unspecified: Secondary | ICD-10-CM | POA: Diagnosis present

## 2020-04-21 DIAGNOSIS — J9601 Acute respiratory failure with hypoxia: Secondary | ICD-10-CM

## 2020-04-21 DIAGNOSIS — I10 Essential (primary) hypertension: Secondary | ICD-10-CM | POA: Diagnosis present

## 2020-04-21 LAB — BLOOD GAS, VENOUS
Acid-Base Excess: 3.1 mmol/L — ABNORMAL HIGH (ref 0.0–2.0)
Bicarbonate: 30.6 mmol/L — ABNORMAL HIGH (ref 20.0–28.0)
O2 Saturation: 82.9 %
Patient temperature: 37
pCO2, Ven: 58 mmHg (ref 44.0–60.0)
pH, Ven: 7.33 (ref 7.250–7.430)
pO2, Ven: 51 mmHg — ABNORMAL HIGH (ref 32.0–45.0)

## 2020-04-21 LAB — CBC WITH DIFFERENTIAL/PLATELET
Abs Immature Granulocytes: 0.04 10*3/uL (ref 0.00–0.07)
Basophils Absolute: 0 10*3/uL (ref 0.0–0.1)
Basophils Relative: 1 %
Eosinophils Absolute: 0.1 10*3/uL (ref 0.0–0.5)
Eosinophils Relative: 1 %
HCT: 41.9 % (ref 39.0–52.0)
Hemoglobin: 14.3 g/dL (ref 13.0–17.0)
Immature Granulocytes: 1 %
Lymphocytes Relative: 27 %
Lymphs Abs: 2.2 10*3/uL (ref 0.7–4.0)
MCH: 30.6 pg (ref 26.0–34.0)
MCHC: 34.1 g/dL (ref 30.0–36.0)
MCV: 89.7 fL (ref 80.0–100.0)
Monocytes Absolute: 0.8 10*3/uL (ref 0.1–1.0)
Monocytes Relative: 9 %
Neutro Abs: 5 10*3/uL (ref 1.7–7.7)
Neutrophils Relative %: 61 %
Platelets: 218 10*3/uL (ref 150–400)
RBC: 4.67 MIL/uL (ref 4.22–5.81)
RDW: 13.2 % (ref 11.5–15.5)
WBC: 8.2 10*3/uL (ref 4.0–10.5)
nRBC: 0 % (ref 0.0–0.2)

## 2020-04-21 LAB — D-DIMER, QUANTITATIVE: D-Dimer, Quant: 0.31 ug/mL-FEU (ref 0.00–0.50)

## 2020-04-21 LAB — RESP PANEL BY RT-PCR (FLU A&B, COVID) ARPGX2
Influenza A by PCR: NEGATIVE
Influenza B by PCR: NEGATIVE
SARS Coronavirus 2 by RT PCR: NEGATIVE

## 2020-04-21 LAB — BASIC METABOLIC PANEL
Anion gap: 8 (ref 5–15)
BUN: 18 mg/dL (ref 8–23)
CO2: 27 mmol/L (ref 22–32)
Calcium: 9.2 mg/dL (ref 8.9–10.3)
Chloride: 104 mmol/L (ref 98–111)
Creatinine, Ser: 1.11 mg/dL (ref 0.61–1.24)
GFR, Estimated: >60 mL/min (ref >60)
Glucose, Bld: 119 mg/dL — ABNORMAL HIGH (ref 70–99)
Potassium: 4.5 mmol/L (ref 3.5–5.1)
Sodium: 139 mmol/L (ref 135–145)

## 2020-04-21 LAB — HIV ANTIBODY (ROUTINE TESTING W REFLEX): HIV Screen 4th Generation wRfx: NONREACTIVE

## 2020-04-21 LAB — PROCALCITONIN: Procalcitonin: <0.1 ng/mL

## 2020-04-21 LAB — LACTIC ACID, PLASMA
Lactic Acid, Venous: 1.1 mmol/L (ref 0.5–1.9)
Lactic Acid, Venous: 1.1 mmol/L (ref 0.5–1.9)

## 2020-04-21 LAB — TROPONIN I (HIGH SENSITIVITY)
Troponin I (High Sensitivity): 9 ng/L (ref <18)
Troponin I (High Sensitivity): 7 ng/L (ref <18)

## 2020-04-21 MED ORDER — SODIUM CHLORIDE 0.9 % IV SOLN
1.0000 g | Freq: Once | INTRAVENOUS | Status: AC
  Administered 2020-04-21: 1 g via INTRAVENOUS
  Filled 2020-04-21: qty 1

## 2020-04-21 MED ORDER — IPRATROPIUM-ALBUTEROL 0.5-2.5 (3) MG/3ML IN SOLN
3.0000 mL | Freq: Once | RESPIRATORY_TRACT | Status: AC
  Administered 2020-04-21: 3 mL via RESPIRATORY_TRACT
  Filled 2020-04-21: qty 3

## 2020-04-21 MED ORDER — DONEPEZIL HCL 5 MG PO TABS
5.0000 mg | ORAL_TABLET | Freq: Every day | ORAL | Status: DC
  Filled 2020-04-21: qty 1

## 2020-04-21 MED ORDER — ALBUTEROL SULFATE (2.5 MG/3ML) 0.083% IN NEBU
2.5000 mg | INHALATION_SOLUTION | RESPIRATORY_TRACT | Status: DC | PRN
  Administered 2020-04-22: 2.5 mg via RESPIRATORY_TRACT
  Filled 2020-04-21: qty 3

## 2020-04-21 MED ORDER — SODIUM CHLORIDE 0.9 % IV SOLN: 500.0000 mg | INTRAVENOUS | Status: DC

## 2020-04-21 MED ORDER — SODIUM CHLORIDE 0.9 % IV SOLN
1.0000 g | INTRAVENOUS | Status: DC
  Administered 2020-04-21 – 2020-04-22 (×2): 1 g via INTRAVENOUS
  Filled 2020-04-21: qty 1
  Filled 2020-04-21 (×2): qty 10

## 2020-04-21 MED ORDER — ARIPIPRAZOLE 15 MG PO TABS: 15.0000 mg | ORAL_TABLET | Freq: Every day | ORAL | Status: DC

## 2020-04-21 MED ORDER — SODIUM CHLORIDE 0.9 % IV SOLN
500.0000 mg | INTRAVENOUS | Status: DC
  Administered 2020-04-22 – 2020-04-23 (×2): 500 mg via INTRAVENOUS
  Filled 2020-04-21 (×3): qty 500

## 2020-04-21 MED ORDER — ARIPIPRAZOLE 15 MG PO TABS
30.0000 mg | ORAL_TABLET | Freq: Every day | ORAL | Status: DC
  Administered 2020-04-22 – 2020-04-23 (×2): 30 mg via ORAL
  Filled 2020-04-21 (×3): qty 2

## 2020-04-21 MED ORDER — IRBESARTAN 150 MG PO TABS
150.0000 mg | ORAL_TABLET | Freq: Every day | ORAL | Status: DC
  Administered 2020-04-21 – 2020-04-23 (×3): 150 mg via ORAL
  Filled 2020-04-21 (×3): qty 1

## 2020-04-21 MED ORDER — SODIUM CHLORIDE 0.9 % IV SOLN
INTRAVENOUS | Status: DC | PRN
  Administered 2020-04-21: 250 mL via INTRAVENOUS

## 2020-04-21 MED ORDER — QUETIAPINE FUMARATE 25 MG PO TABS
25.0000 mg | ORAL_TABLET | Freq: Every day | ORAL | Status: DC
  Administered 2020-04-21 – 2020-04-23 (×3): 25 mg via ORAL
  Filled 2020-04-21 (×3): qty 1

## 2020-04-21 MED ORDER — ENOXAPARIN SODIUM 40 MG/0.4ML ~~LOC~~ SOLN
40.0000 mg | SUBCUTANEOUS | Status: DC
  Administered 2020-04-21 – 2020-04-23 (×3): 40 mg via SUBCUTANEOUS
  Filled 2020-04-21 (×3): qty 0.4

## 2020-04-21 MED ORDER — VANCOMYCIN HCL IN DEXTROSE 1-5 GM/200ML-% IV SOLN
1000.0000 mg | Freq: Once | INTRAVENOUS | Status: AC
  Administered 2020-04-21: 1000 mg via INTRAVENOUS
  Filled 2020-04-21: qty 200

## 2020-04-21 MED ORDER — IPRATROPIUM-ALBUTEROL 0.5-2.5 (3) MG/3ML IN SOLN
3.0000 mL | Freq: Four times a day (QID) | RESPIRATORY_TRACT | Status: DC
  Administered 2020-04-21 – 2020-04-23 (×10): 3 mL via RESPIRATORY_TRACT
  Filled 2020-04-21 (×10): qty 3

## 2020-04-21 MED ORDER — SODIUM CHLORIDE 0.9 % IV SOLN: 1.0000 g | INTRAVENOUS | Status: DC

## 2020-04-21 MED ORDER — METHYLPREDNISOLONE SODIUM SUCC 40 MG IJ SOLR
40.0000 mg | Freq: Four times a day (QID) | INTRAMUSCULAR | Status: AC
Start: 2020-04-21 — End: 2020-04-21
  Administered 2020-04-21 (×4): 40 mg via INTRAVENOUS
  Filled 2020-04-21 (×4): qty 1

## 2020-04-21 MED ORDER — DONEPEZIL HCL 5 MG PO TABS
5.0000 mg | ORAL_TABLET | Freq: Every day | ORAL | Status: DC
  Administered 2020-04-22: 5 mg via ORAL
  Filled 2020-04-21 (×2): qty 1

## 2020-04-21 MED ORDER — DONEPEZIL HCL 5 MG PO TABS
5.0000 mg | ORAL_TABLET | Freq: Every day | ORAL | Status: DC
  Administered 2020-04-21: 5 mg via ORAL
  Filled 2020-04-21: qty 1

## 2020-04-21 MED ORDER — TRAZODONE HCL 100 MG PO TABS
100.0000 mg | ORAL_TABLET | Freq: Every day | ORAL | Status: DC
  Administered 2020-04-21 – 2020-04-22 (×2): 100 mg via ORAL
  Filled 2020-04-21 (×2): qty 1

## 2020-04-21 MED ORDER — PREDNISONE 20 MG PO TABS
40.0000 mg | ORAL_TABLET | Freq: Every day | ORAL | Status: DC
  Administered 2020-04-22: 40 mg via ORAL
  Filled 2020-04-21: qty 2

## 2020-04-21 MED ORDER — HALOPERIDOL LACTATE 5 MG/ML IJ SOLN
5.0000 mg | Freq: Once | INTRAMUSCULAR | Status: AC
  Administered 2020-04-22: 5 mg via INTRAVENOUS
  Filled 2020-04-21: qty 1

## 2020-04-21 MED ORDER — QUETIAPINE FUMARATE 300 MG PO TABS
600.0000 mg | ORAL_TABLET | Freq: Every day | ORAL | Status: DC
  Administered 2020-04-21 – 2020-04-22 (×2): 600 mg via ORAL
  Filled 2020-04-21 (×3): qty 2

## 2020-04-21 MED ORDER — SODIUM CHLORIDE 0.9 % IV SOLN
500.0000 mg | Freq: Once | INTRAVENOUS | Status: AC
  Administered 2020-04-21: 500 mg via INTRAVENOUS
  Filled 2020-04-21: qty 500

## 2020-04-21 MED ORDER — QUETIAPINE FUMARATE 25 MG PO TABS: 100.0000 mg | ORAL_TABLET | Freq: Every day | ORAL | Status: DC

## 2020-04-21 MED ORDER — AMLODIPINE BESYLATE 10 MG PO TABS
10.0000 mg | ORAL_TABLET | Freq: Every day | ORAL | Status: DC
  Administered 2020-04-21 – 2020-04-23 (×3): 10 mg via ORAL
  Filled 2020-04-21: qty 1
  Filled 2020-04-21: qty 2
  Filled 2020-04-21: qty 1

## 2020-04-21 NOTE — ED Notes (Signed)
Removed pt from BIPAP so pt could eat breakfast, pt placed on 6L [place], sats 98%.  Pt tolerating well.

## 2020-04-21 NOTE — H&P (Signed)
History and Physical    Carlos Nelson TFT:[id number] DOB: 1950/06/16 DOA: 04/21/2020  PCP: [facility]   Patient coming from: Group home  I have personally briefly reviewed patient's old medical records in [place]  Chief Complaint: Wheezing, shortness of breath  HPI: Carlos Nelson is a 70 y.o. male with medical history significant for HTN, COPD, schizophrenia hospitalized monthly for the past 3 months with COPD exacerbation and respiratory failure, who was sent from the group home tonight with shortness of breath typical for his exacerbations not responding to home bronchodilators.  He was hypoxic with EMS and placed on oxygen receiving duo nebs in route.  He denied chest pain, fever or chills.  Denies nausea vomiting, abdominal pain, change in bowel habits ED Course: On arrival BP 141/89 tachycardic at 107, tachypneic at 33 but afebrile.  Blood work unremarkable.  Venous blood gas with pH 7.33, PCO2 58, EKG as reviewed by me : NSR at 99 with nonspecific ST-T wave changes Imaging: Chest x-ray: Chronic pulmonary hyperinflation with patchy left lung peribronchial opacity compatible with acute infectious exacerbation / bronchopneumonia. No pleural effusion is evident  Due to increased work of breathing, patient speaking in short sentences, hypoxic with EMS, he was placed on BiPAP in the ER.  Continue treatment with duo nebs Solu-Medrol.  Hospitalist consulted for admission.  Review of Systems: As per HPI otherwise all other systems on review of systems negative.    Past Medical History:  Diagnosis Date  . Chronic respiratory failure with hypoxia ([place])   . COPD (chronic obstructive pulmonary disease) ([place])   . Hypertension   . Schizophrenia [facility])     Past Surgical History:  Procedure Laterality Date  . COLONOSCOPY    . COLONOSCOPY WITH PROPOFOL N/A 12/13/2016   Procedure: COLONOSCOPY WITH PROPOFOL;  Surgeon: Lollie Sails, MD;  Location: [facility]  ENDOSCOPY;  Service: Endoscopy;  Laterality: N/A;  . ESOPHAGOGASTRODUODENOSCOPY (EGD) WITH PROPOFOL N/A 12/13/2016   Procedure: ESOPHAGOGASTRODUODENOSCOPY (EGD) WITH PROPOFOL;  Surgeon: Lollie Sails, MD;  Location: [facility] ENDOSCOPY;  Service: Endoscopy;  Laterality: N/A;  . left arm surgery       reports that he has been smoking cigarettes. He has been smoking about 1.00 pack per day. He has never used smokeless tobacco. He reports current alcohol use. He reports previous drug use. Drug: Marijuana.  Allergies  Allergen Reactions  . Ace Inhibitors Swelling    Ok with benazapril, per grouphome    Family History  Problem Relation Age of Onset  . Prostate cancer Neg Hx   . Bladder Cancer Neg Hx       Prior to Admission medications   Medication Sig Start Date End Date Taking? Authorizing Provider  albuterol (VENTOLIN HFA) 108 (90 Base) MCG/ACT inhaler Inhale 2-4 puffs by mouth every 4 hours as needed for wheezing, cough, and/or shortness of breath 04/02/20   Hinda Kehr, MD  amlodipine-olmesartan (AZOR) 10-20 MG tablet Take 1 tablet by mouth daily. 03/23/20   Loel Dubonnet, NP  ARIPiprazole (ABILIFY) 15 MG tablet Take 15 mg by mouth daily. 01/29/20   [provider]  docusate sodium (COLACE) 100 MG capsule Take 1 capsule (100 mg total) by mouth 2 (two) times daily. 04/12/20 05/12/20  Harvest Dark, MD  donepezil (ARICEPT) 5 MG tablet Take 5 mg by mouth at bedtime.    [provider]  hydrOXYzine (ATARAX/VISTARIL) 50 MG tablet Take 50 mg by mouth every 6 (six) hours as needed for anxiety  or itching.    [provider]  ipratropium-albuterol (DUONEB) 0.5-2.5 (3) MG/3ML SOLN Take 3 mLs by nebulization every 4 (four) hours for 14 days. 03/11/20 03/25/20  Fritzi Mandes, MD  pantoprazole (PROTONIX) 20 MG tablet Take 1 tablet (20 mg total) by mouth daily. 12/18/19 12/17/20  Lavonia Drafts, MD  polyethylene glycol powder (GLYCOLAX/MIRALAX) 17 GM/SCOOP powder Take 17 g  by mouth daily as needed for moderate constipation. 04/12/20   Harvest Dark, MD  predniSONE (DELTASONE) 10 MG tablet Take 4 tabs (40 mg) PO x 3 days, then take 2 tabs (20 mg) PO x 3 days, then take 1 tab (10 mg) PO x 3 days, then take 1/2 tab (5 mg) PO x 4 days. 04/02/20   Hinda Kehr, MD  QUEtiapine (SEROQUEL) 100 MG tablet Take 100 mg by mouth daily.    [provider]  QUEtiapine (SEROQUEL) 200 MG tablet Take 3 tablets (600 mg total) by mouth at bedtime. 02/03/20   Harvest Dark, MD  QUEtiapine (SEROQUEL) 25 MG tablet Take 25 mg by mouth daily. 02/25/20   [provider]  traZODone (DESYREL) 100 MG tablet Take 100 mg by mouth at bedtime. 02/24/20   [provider]  vitamin B-12 (CYANOCOBALAMIN) 1000 MCG tablet Take 1,000 mcg by mouth daily.    [provider]    Physical Exam: Vitals:   04/21/20 0359 04/21/20 0400 04/21/20 0430 04/21/20 0500  BP:  135/84 128/78   Pulse: 100 95 99 99  Resp: (!) 33 (!) 24 (!) 24 16  Temp:      TempSrc:      SpO2: 100% 100% 100% 100%  Weight:      Height:         Vitals:   04/21/20 0359 04/21/20 0400 04/21/20 0430 04/21/20 0500  BP:  135/84 128/78   Pulse: 100 95 99 99  Resp: (!) 33 (!) 24 (!) 24 16  Temp:      TempSrc:      SpO2: 100% 100% 100% 100%  Weight:      Height:       Constitutional:Alert and oriented x 3 .  Moderate respiratory distress HEENT: Head:Normocephalic and atraumatic.  Eyes:PERLA, EOMI, Conjunctivae are normal. Sclera is non-icteric.  Mouth/Throat:Mucous membranes are moist.  Neck:Supple with no signs of meningismus. Cardiovascular:R tachycardic no murmurs, gallops, or rubs. 2+ symmetrical distal pulses are present . No JVD. No LE edema Respiratory:Respiratory effort increased.Lungs sounds diminished bilaterally.  Scattered rhonchi.  Gastrointestinal:Soft, non tender, and non distended with positive bowel sounds.  Genitourinary:No CVA  tenderness. Musculoskeletal:Nontender with normal range of motion in all extremities. No cyanosis, or erythema of extremities. Neurologic:  Face is symmetric. Moving all extremities. No gross focal neurologic deficits . Skin:Skin is warm, dry.  No rash or ulcers Psychiatric: Mood and affect are normal  Labs on Admission: I have personally reviewed following labs and imaging studies  CBC: Recent Labs  Lab 04/21/20 0344  WBC 8.2  NEUTROABS 5.0  HGB 14.3  HCT 41.9  MCV 89.7  PLT 932   Basic Metabolic Panel: Recent Labs  Lab 04/21/20 0344  NA 139  K 4.5  CL 104  CO2 27  GLUCOSE 119*  BUN 18  CREATININE 1.11  CALCIUM 9.2   GFR: Estimated Creatinine Clearance: 51.7 mL/min (by C-G formula based on SCr of 1.11 mg/dL). Liver Function Tests: No results for input(s): AST, ALT, ALKPHOS, BILITOT, PROT, ALBUMIN in the last 168 hours. No results for input(s): LIPASE, AMYLASE  in the last 168 hours. No results for input(s): AMMONIA in the last 168 hours. Coagulation Profile: No results for input(s): INR, PROTIME in the last 168 hours. Cardiac Enzymes: No results for input(s): CKTOTAL, CKMB, CKMBINDEX, TROPONINI in the last 168 hours. BNP (last 3 results) No results for input(s): PROBNP in the last 8760 hours. HbA1C: No results for input(s): HGBA1C in the last 72 hours. CBG: No results for input(s): GLUCAP in the last 168 hours. Lipid Profile: No results for input(s): CHOL, HDL, LDLCALC, TRIG, CHOLHDL, LDLDIRECT in the last 72 hours. Thyroid Function Tests: No results for input(s): TSH, T4TOTAL, FREET4, T3FREE, THYROIDAB in the last 72 hours. Anemia Panel: No results for input(s): VITAMINB12, FOLATE, FERRITIN, TIBC, IRON, RETICCTPCT in the last 72 hours. Urine analysis:    Component Value Date/Time   COLORURINE YELLOW (A) 12/18/2019 0213   APPEARANCEUR CLEAR (A) 12/18/2019 0213   APPEARANCEUR Clear 09/04/2019 1429   LABSPEC 1.017 12/18/2019 0213   LABSPEC 1.014 11/09/2013  1525   PHURINE 6.0 12/18/2019 0213   GLUCOSEU NEGATIVE 12/18/2019 0213   GLUCOSEU Negative 11/09/2013 1525   HGBUR NEGATIVE 12/18/2019 0213   BILIRUBINUR NEGATIVE 12/18/2019 0213   BILIRUBINUR Negative 09/04/2019 1429   BILIRUBINUR Negative 11/09/2013 1525   KETONESUR NEGATIVE 12/18/2019 0213   PROTEINUR NEGATIVE 12/18/2019 0213   NITRITE NEGATIVE 12/18/2019 0213   LEUKOCYTESUR NEGATIVE 12/18/2019 0213   LEUKOCYTESUR Negative 11/09/2013 1525    Radiological Exams on Admission: DG Chest Portable 1 View  Result Date: 04/21/2020 CLINICAL DATA:  70 year old male with shortness of breath. Respiratory distress. EXAM: PORTABLE CHEST 1 VIEW COMPARISON:  Chest radiographs 04/11/2020. Chest CT 10/15/2019 and earlier. FINDINGS: Portable AP upright view at 0341 hours. Lower lung volumes. Chronic pulmonary hyperinflation with centrilobular emphysema demonstrated on prior exams. Mediastinal contours remain normal. Visualized tracheal air column is within normal limits. No pneumothorax, pulmonary edema, or pleural effusion. Patchy peribronchial opacity in the left upper lobe is new. Questionable similar peribronchial opacity also at the medial left lung base. Elsewhere lung markings are stable. No acute osseous abnormality identified. IMPRESSION: Chronic pulmonary hyperinflation with patchy left lung peribronchial opacity compatible with acute infectious exacerbation / bronchopneumonia. No pleural effusion is evident. Electronically Signed   By: Genevie Ann M.D.   On: 04/21/2020 04:11     Assessment/Plan Active Problems:   COPD with acute exacerbation (HCC)   Acute respiratory failure (HCC)   Hypertension   CAD (coronary artery disease)  70 year old male with history of HTN, COPD, schizophrenia, 3 recent admissions for COPD exacerbation sent from group home with worsening shortness of breath not responding to home bronchodilators.   COPD with acute exacerbation ([place])   Acute respiratory failure with  hypoxia ([place]) -Patient presents with severe wheezing typical of COPD exacerbations with increased work of breathing unable to speak in complete sentences requiring BiPAP.  Monthly hospitalizations over the past 3 months for the same -COVID flu test negative.   -Chest x-ray:Chronic pulmonary hyperinflation with patchy left lung peribronchialopacity compatible with acute infectious exacerbation / bronchopneumonia. No pleural effusion is evident -IV Rocephin -Scheduled and as needed nebulized bronchodilator therapy -IV steroids -Flutter valve, mucolytic's -Continue BiPAP and wean as tolerated  CAD -No complaints of chest pain.  EKG nonacute.  Troponin of 9   Essential hypertension -Continue home amlodipine/olmesartan or formulary substitution    Schizophrenia ([place]) -Continue home meds pending med rec    DVT prophylaxis: Lovenox  Code Status: full code  Family Communication:  none  Disposition Plan:  Back to previous home environment Consults called: none  Status observation    Athena Masse MD [facility]     04/21/2020, 5:18 AM

## 2020-04-21 NOTE — ED Notes (Signed)
XR and RT at bedside

## 2020-04-21 NOTE — Progress Notes (Signed)
OT Cancellation Note  Patient Details Name: Carlos Nelson MRN: [id number] DOB: 1950-10-03   Cancelled Treatment:    Reason Eval/Treat Not Completed: Medical issues which prohibited therapy. Order received and chart reviewed. RN reports pt is on Bipap. Per therapy protocols will hold and re-attempt at later time/date as able.   Dessie Coma, M.S. OTR/L  04/21/20, 8:36 AM  ascom [phone]

## 2020-04-21 NOTE — ED Provider Notes (Signed)
[facility] Emergency Department Provider Note  ____________________________________________  Time seen: Approximately 4:37 AM  I have reviewed the triage vital signs and the nursing notes.   HISTORY  Chief Complaint Shortness of Breath (Pt presenting via EMS from group home with difficulty breathing. Pt reports he began feeling SOB around 7pm. Endorses chest tightness. EMS reports wheezing in all 4 lobes. Given 125mg  solumedrol and duoneb x2 with EMS)   HPI Carlos Nelson is a 70 y.o. male with a history of COPD, hypertension, schizophrenia, smoking who presents for evaluation of shortness of breath.  Patient reports that started feeling short of breath around 7 PM.  Has been using his inhaler with no significant relief.  Has had a cough productive of clear sputum.  Denies fever or chills.  Is complaining of chest tightness associated with severe constant shortness of breath.  When EMS arrived patient was wheezing and hypoxic.  He was transported on oxygen, given Solu-Medrol and 2 duo nebs.  Upon arrival patient is in moderate respiratory distress.  He denies any personal or family history of blood clots, pleuritic chest pain, hemoptysis or exogenous hormones.   Past Medical History:  Diagnosis Date  . Chronic respiratory failure with hypoxia ([place])   . COPD (chronic obstructive pulmonary disease) ([place])   . Hypertension   . Schizophrenia [facility])     Patient Active Problem List   Diagnosis Date Noted  . Protein-calorie malnutrition, severe 03/18/2020  . COPD with acute exacerbation ([place]) 03/10/2020  . Right-sided chest pain 03/10/2020  . HLD (hyperlipidemia) 01/18/2020  . CAD (coronary artery disease) 01/18/2020  . Hypertensive urgency 01/18/2020  . Tobacco abuse 01/18/2020  . Scrotum pain   . Transaminitis   . Demand ischemia ([place]) 06/11/2019  . Malnutrition of moderate degree 06/10/2019  . NSTEMI (non-ST elevated myocardial infarction) ([place])  06/09/2019  . Depression with anxiety 06/09/2019  . Pleuritic chest pain 05/20/2019  . CAP (community acquired pneumonia) 05/20/2019  . Hypertension   . COPD exacerbation ([place])   . Acute respiratory failure ([place])   . Altered mental status 09/25/2018  . Schizophrenia ([place])   . Tachycardia 08/21/2018  . Schizophrenia, chronic with acute exacerbation ([place]) 08/17/2018  . Elevated PSA 05/24/2015    Past Surgical History:  Procedure Laterality Date  . COLONOSCOPY    . COLONOSCOPY WITH PROPOFOL N/A 12/13/2016   Procedure: COLONOSCOPY WITH PROPOFOL;  Surgeon: Lollie Sails, MD;  Location: [facility] ENDOSCOPY;  Service: Endoscopy;  Laterality: N/A;  . ESOPHAGOGASTRODUODENOSCOPY (EGD) WITH PROPOFOL N/A 12/13/2016   Procedure: ESOPHAGOGASTRODUODENOSCOPY (EGD) WITH PROPOFOL;  Surgeon: Lollie Sails, MD;  Location: [facility] ENDOSCOPY;  Service: Endoscopy;  Laterality: N/A;  . left arm surgery      Prior to Admission medications   Medication Sig Start Date End Date Taking? Authorizing Provider  albuterol (VENTOLIN HFA) 108 (90 Base) MCG/ACT inhaler Inhale 2-4 puffs by mouth every 4 hours as needed for wheezing, cough, and/or shortness of breath 04/02/20   Hinda Kehr, MD  amlodipine-olmesartan (AZOR) 10-20 MG tablet Take 1 tablet by mouth daily. 03/23/20   Loel Dubonnet, NP  ARIPiprazole (ABILIFY) 15 MG tablet Take 15 mg by mouth daily. 01/29/20   [provider]  docusate sodium (COLACE) 100 MG capsule Take 1 capsule (100 mg total) by mouth 2 (two) times daily. 04/12/20 05/12/20  Harvest Dark, MD  donepezil (ARICEPT) 5 MG tablet Take 5 mg by mouth at bedtime.    [provider]  hydrOXYzine (ATARAX/VISTARIL)  50 MG tablet Take 50 mg by mouth every 6 (six) hours as needed for anxiety or itching.    [provider]  ipratropium-albuterol (DUONEB) 0.5-2.5 (3) MG/3ML SOLN Take 3 mLs by nebulization every 4 (four) hours for 14 days. 03/11/20 03/25/20  Fritzi Mandes, MD   pantoprazole (PROTONIX) 20 MG tablet Take 1 tablet (20 mg total) by mouth daily. 12/18/19 12/17/20  Lavonia Drafts, MD  polyethylene glycol powder (GLYCOLAX/MIRALAX) 17 GM/SCOOP powder Take 17 g by mouth daily as needed for moderate constipation. 04/12/20   Harvest Dark, MD  predniSONE (DELTASONE) 10 MG tablet Take 4 tabs (40 mg) PO x 3 days, then take 2 tabs (20 mg) PO x 3 days, then take 1 tab (10 mg) PO x 3 days, then take 1/2 tab (5 mg) PO x 4 days. 04/02/20   Hinda Kehr, MD  QUEtiapine (SEROQUEL) 100 MG tablet Take 100 mg by mouth daily.    [provider]  QUEtiapine (SEROQUEL) 200 MG tablet Take 3 tablets (600 mg total) by mouth at bedtime. 02/03/20   Harvest Dark, MD  QUEtiapine (SEROQUEL) 25 MG tablet Take 25 mg by mouth daily. 02/25/20   [provider]  traZODone (DESYREL) 100 MG tablet Take 100 mg by mouth at bedtime. 02/24/20   [provider]  vitamin B-12 (CYANOCOBALAMIN) 1000 MCG tablet Take 1,000 mcg by mouth daily.    [provider]    Allergies Ace inhibitors  Family History  Problem Relation Age of Onset  . Prostate cancer Neg Hx   . Bladder Cancer Neg Hx     Social History Social History   Tobacco Use  . Smoking status: Current Every Day Smoker    Packs/day: 1.00    Types: Cigarettes  . Smokeless tobacco: Never Used  Substance Use Topics  . Alcohol use: Yes    Alcohol/week: 0.0 standard drinks    Comment: beer  . Drug use: Not Currently    Types: Marijuana    Review of Systems  Constitutional: Negative for fever. Eyes: Negative for visual changes. ENT: Negative for sore throat. Neck: No neck pain  Cardiovascular: Negative for chest pain. + chest tightness Respiratory: + shortness of breath, cough Gastrointestinal: Negative for abdominal pain, vomiting or diarrhea. Genitourinary: Negative for dysuria. Musculoskeletal: Negative for back pain. Skin: Negative for rash. Neurological: Negative for headaches,  weakness or numbness. Psych: No SI or HI  ____________________________________________   PHYSICAL EXAM:  VITAL SIGNS: ED Triage Vitals  Enc Vitals Group     BP 04/21/20 0334 (!) 141/89     Pulse Rate 04/21/20 0334 (!) 107     Resp 04/21/20 0334 (!) 33     Temp 04/21/20 0334 98 F (36.7 C)     Temp Source 04/21/20 0334 Oral     SpO2 04/21/20 0334 98 %     Weight 04/21/20 0335 130 lb (59 kg)     Height 04/21/20 0335 5\' 4"  (1.626 m)     Head Circumference --      Peak Flow --      Pain Score --      Pain Loc --      Pain Edu? --      Excl. in [place]? --     Constitutional: Alert and oriented, moderate respiratory distress.  HEENT:      Head: Normocephalic and atraumatic.         Eyes: Conjunctivae are normal. Sclera is non-icteric.       Mouth/Throat:  Mucous membranes are moist.       Neck: Supple with no signs of meningismus. Cardiovascular: Tachycardic with regular rhythm with no JVD and no murmurs Respiratory: Tachypneic with respiratory rate in the mid 30s, tripoding, severely diminished air movement bilaterally with very tight expiratory wheezes, hypoxic to upper 80s on on 2 L nasal cannula Gastrointestinal: Soft, non tender. Musculoskeletal:  No edema, cyanosis, or erythema of extremities. Neurologic: Normal speech and language. Face is symmetric. Moving all extremities. No gross focal neurologic deficits are appreciated. Skin: Skin is warm, dry and intact. No rash noted. Psychiatric: Mood and affect are normal. Speech and behavior are normal.  ____________________________________________   LABS (all labs ordered are listed, but only abnormal results are displayed)  Labs Reviewed  BASIC METABOLIC PANEL - Abnormal; Notable for the following components:      Result Value   Glucose, Bld 119 (*)    All other components within normal limits  BLOOD GAS, VENOUS - Abnormal; Notable for the following components:   pO2, Ven 51.0 (*)    Bicarbonate 30.6 (*)    Acid-Base  Excess 3.1 (*)    All other components within normal limits  RESP PANEL BY RT-PCR (FLU A&B, COVID) ARPGX2  CULTURE, BLOOD (ROUTINE X 2)  CULTURE, BLOOD (ROUTINE X 2)  CBC WITH DIFFERENTIAL/PLATELET  LACTIC ACID, PLASMA  LACTIC ACID, PLASMA  PROCALCITONIN  TROPONIN I (HIGH SENSITIVITY)   ____________________________________________  EKG  ED ECG REPORT I, Rudene Re, the attending physician, personally viewed and interpreted this ECG.  Normal sinus rhythm, rate of 99, normal intervals, no ST elevations or depressions.  Low voltage QRS.  Unchanged when compared to prior from a week ago. ____________________________________________  RADIOLOGY  I have personally reviewed the images performed during this visit and I agree with the Radiologist's read.   Interpretation by Radiologist:  DG Chest Portable 1 View  Result Date: 04/21/2020 CLINICAL DATA:  70 year old male with shortness of breath. Respiratory distress. EXAM: PORTABLE CHEST 1 VIEW COMPARISON:  Chest radiographs 04/11/2020. Chest CT 10/15/2019 and earlier. FINDINGS: Portable AP upright view at 0341 hours. Lower lung volumes. Chronic pulmonary hyperinflation with centrilobular emphysema demonstrated on prior exams. Mediastinal contours remain normal. Visualized tracheal air column is within normal limits. No pneumothorax, pulmonary edema, or pleural effusion. Patchy peribronchial opacity in the left upper lobe is new. Questionable similar peribronchial opacity also at the medial left lung base. Elsewhere lung markings are stable. No acute osseous abnormality identified. IMPRESSION: Chronic pulmonary hyperinflation with patchy left lung peribronchial opacity compatible with acute infectious exacerbation / bronchopneumonia. No pleural effusion is evident. Electronically Signed   By: Genevie Ann M.D.   On: 04/21/2020 04:11     ____________________________________________   PROCEDURES  Procedure(s) performed:yes .1-3 Lead EKG  Interpretation Performed by: Rudene Re, MD Authorized by: Rudene Re, MD     Interpretation: abnormal     ECG rate assessment: normal     Rhythm: sinus rhythm     Ectopy: none     Conduction: normal     Critical Care performed: yes  CRITICAL CARE Performed by: Rudene Re  ?  Total critical care time: 35 min  Critical care time was exclusive of separately billable procedures and treating other patients.  Critical care was necessary to treat or prevent imminent or life-threatening deterioration.  Critical care was time spent personally by me on the following activities: development of treatment plan with patient and/or surrogate as well as nursing, discussions with consultants, evaluation of  patient's response to treatment, examination of patient, obtaining history from patient or surrogate, ordering and performing treatments and interventions, ordering and review of laboratory studies, ordering and review of radiographic studies, pulse oximetry and re-evaluation of patient's condition.  ____________________________________________   INITIAL IMPRESSION / ASSESSMENT AND PLAN / ED COURSE  70 y.o. male with a history of COPD, hypertension, schizophrenia, smoking who presents for evaluation of shortness of breath, chest tightness and cough that started at 7 PM.  Patient arrives in moderate respiratory distress, tachypneic, tripoding, severely diminished air movement and hypoxic on room air requiring 2 L nasal cannula after receiving 2 duo nebs and 125 mg of Solu-Medrol per EMS.  Patient was placed on BiPAP.  EKG with no signs of acute ischemia.  VBG with a pH of 7.33 and a PCO2 of 58.  Negative troponin.  Blood work with no significant electrolyte derangements.  No leukocytosis or anemia.  Chest x-ray visualized by me and read by radiology as acute infectious bronchopneumonia.  No signs of sepsis.  Since patient has been admitted for COPD less than a month ago will  cover broadly with cefepime, Vanco, and azithromycin.  Patient received 3 duo nebs and remains on BiPAP with improvement of his respiratory status.  Discussed findings with patient and need for admission.  Patient is in agreement.  Covid and flu pending.  Old medical records reviewed       _____________________________________________ Please note:  Patient was evaluated in Emergency Department today for the symptoms described in the history of present illness. Patient was evaluated in the context of the global COVID-19 pandemic, which necessitated consideration that the patient might be at risk for infection with the SARS-CoV-2 virus that causes COVID-19. Institutional protocols and algorithms that pertain to the evaluation of patients at risk for COVID-19 are in a state of rapid change based on information released by regulatory bodies including the CDC and federal and state organizations. These policies and algorithms were followed during the patient's care in the ED.  Some ED evaluations and interventions may be delayed as a result of limited staffing during the pandemic.   [place] Controlled Substance Database was reviewed by me. ____________________________________________   FINAL CLINICAL IMPRESSION(S) / ED DIAGNOSES   Final diagnoses:  Acute respiratory failure with hypoxia ([place])  Bronchopneumonia      NEW MEDICATIONS STARTED DURING THIS VISIT:  ED Discharge Orders    None       Note:  This document was prepared using Dragon voice recognition software and may include unintentional dictation errors.    Alfred Levins, [place], MD 04/21/20 [phone]

## 2020-04-21 NOTE — Progress Notes (Signed)
PT Cancellation Note  Patient Details Name: Carlos Nelson MRN: [id number] DOB: 1950/06/18   Cancelled Treatment:    Reason Eval/Treat Not Completed: Patient not medically ready. Patient currently on Bipap. Not appropriate for PT evaluation at this time. Will continue to follow and see when more medically appropriate.     Modene Andy 04/21/2020, 10:44 AM

## 2020-04-21 NOTE — ED Notes (Signed)
Pt tolerating BiPAP well.

## 2020-04-21 NOTE — ED Notes (Signed)
Respiratory called to place pt on BiPAP per Alfred Levins MD

## 2020-04-21 NOTE — Evaluation (Signed)
Physical Therapy Evaluation Patient Details Name: Carlos Nelson MRN: [id number] DOB: 1950/07/13 Today's Date: 04/21/2020   History of Present Illness  Carlos Nelson is a 70 y.o. male with medical history significant for HTN, COPD, schizophrenia hospitalized monthly for the past 3 months with COPD exacerbation and respiratory failure, who was sent from the group home tonight with shortness of breath typical for his exacerbations not responding to home bronchodilators.  He was hypoxic with EMS and placed on oxygen receiving duo nebs in route.  Clinical Impression  Patient received sitting up on side of stretcher. Reports he is breathing better, but continues to be sob. Weaned down to 2 lpm O2 at this time by Therapist, sports. Patient saturations remained in upper 90%s -100% throughout session. Patient transfers with supervision, ambulated without ad, supervision, pushing O2 tank. No lob, but patient limited by SOB. Requested to turn around after about 100 feet due to SOB. Patient is visibly sob with accessory muscles used once returned to sitting. Educated patient in pursed lip breathing. Patient will continue to benefit from skilled PT while here to improve functional independence and improve activity tolerance for safe return home.     Follow Up Recommendations No PT follow up    Equipment Recommendations  None recommended by PT    Recommendations for Other Services       Precautions / Restrictions Precautions Precautions: None Restrictions Weight Bearing Restrictions: No      Mobility  Bed Mobility               General bed mobility comments: patient received sitting up on side of bed and remained sitting once returned to room.    Transfers Overall transfer level: Modified independent Equipment used: None                Ambulation/Gait Ambulation/Gait assistance: Supervision Gait Distance (Feet): 200 Feet Assistive device: None Gait Pattern/deviations: Step-through  pattern Gait velocity: WFL   General Gait Details: patient pushed O2 tank, no lob. Patient limited by SOB with ambulation, O2 saturations remained in upper 90%s on 2 liters throughout session.  Stairs            Wheelchair Mobility    Modified Rankin (Stroke Patients Only)       Balance Overall balance assessment: No apparent balance deficits (not formally assessed)                                           Pertinent Vitals/Pain Pain Assessment: No/denies pain    Home Living Family/patient expects to be discharged to:: Group home                 Additional Comments: Reports group home is single-story with 3 steps, R rail to enter    Prior Function Level of Independence: Independent         Comments: reports no falls recently; AMB around yard, neigborhood.  No home O2.  Responsible for taking out trash at facility.     Hand Dominance        Extremity/Trunk Assessment   Upper Extremity Assessment Upper Extremity Assessment: Overall WFL for tasks assessed    Lower Extremity Assessment Lower Extremity Assessment: Overall WFL for tasks assessed    Cervical / Trunk Assessment Cervical / Trunk Assessment: Normal  Communication   Communication: Expressive difficulties;Other (comment) (garbled speech)  Cognition Arousal/Alertness: Awake/alert Behavior During  Therapy: WFL for tasks assessed/performed Overall Cognitive Status: Within Functional Limits for tasks assessed                                        General Comments      Exercises     Assessment/Plan    PT Assessment Patient needs continued PT services  PT Problem List Decreased mobility;Decreased activity tolerance;Cardiopulmonary status limiting activity       PT Treatment Interventions Gait training;Functional mobility training;Therapeutic activities;Patient/family education;Therapeutic exercise    PT Goals (Current goals can be found in the Care  Plan section)  Acute Rehab PT Goals Patient Stated Goal: to return home, breathe better PT Goal Formulation: With patient Time For Goal Achievement: 04/28/20 Potential to Achieve Goals: Good    Frequency Min 2X/week   Barriers to discharge        Co-evaluation               AM-PAC PT "6 Clicks" Mobility  Outcome Measure Help needed turning from your back to your side while in a flat bed without using bedrails?: None Help needed moving from lying on your back to sitting on the side of a flat bed without using bedrails?: None Help needed moving to and from a bed to a chair (including a wheelchair)?: None Help needed standing up from a chair using your arms (e.g., wheelchair or bedside chair)?: None Help needed to walk in hospital room?: A Little Help needed climbing 3-5 steps with a railing? : A Little 6 Click Score: 22    End of Session Equipment Utilized During Treatment: Oxygen Activity Tolerance: Other (comment);Patient limited by fatigue (limited by SOB) Patient left: Other (comment) (sitting on edge of stretcher) Nurse Communication: Mobility status PT Visit Diagnosis: Difficulty in walking, not elsewhere classified (R26.2)    Time: 7353-2992 PT Time Calculation (min) (ACUTE ONLY): 12 min   Charges:   PT Evaluation $PT Eval Low Complexity: 1 Low          Swannie Milius, PT, [facility] 04/21/20,1:33 PM

## 2020-04-21 NOTE — ED Notes (Signed)
Pt resting on stretcher in NAD, continues to tolerate bipap well. Azithromycin infusing, awaiting that to finish prior to starting vanc. Denies needs at this time

## 2020-04-21 NOTE — Progress Notes (Signed)
PROGRESS NOTE   HPI was taken from Dr. Damita Dunnings: Carlos Nelson is a 70 y.o. male with medical history significant for HTN, COPD, schizophrenia hospitalized monthly for the past 3 months with COPD exacerbation and respiratory failure, who was sent from the group home tonight with shortness of breath typical for his exacerbations not responding to home bronchodilators.  He was hypoxic with EMS and placed on oxygen receiving duo nebs in route.  He denied chest pain, fever or chills.  Denies nausea vomiting, abdominal pain, change in bowel habits ED Course: On arrival BP 141/89 tachycardic at 107, tachypneic at 33 but afebrile.  Blood work unremarkable.  Venous blood gas with pH 7.33, PCO2 58, EKG as reviewed by me : NSR at 99 with nonspecific ST-T wave changes Imaging: Chest x-ray: Chronic pulmonary hyperinflation with patchy left lung peribronchial opacity compatible with acute infectious exacerbation / bronchopneumonia. No pleural effusion is evident  Due to increased work of breathing, patient speaking in short sentences, hypoxic with EMS, he was placed on BiPAP in the ER.  Continue treatment with duo nebs Solu-Medrol.  Hospitalist consulted for admission.   Carlos Nelson  PJA:[id number] DOB: 23-May-1950 DOA: 04/21/2020 PCP: [facility]  Assessment & Plan:   Active Problems:   Schizophrenia, chronic with acute exacerbation (HCC)   Hypertension   Acute respiratory failure (HCC)   CAD (coronary artery disease)   COPD with acute exacerbation (HCC)   COPD exacerbation: continue on IV steroids, bronchodilators & azithromycin. Encourage incentive spirometry  Pneumonia: continue on IV azithromycin, ceftriaxone, IV steroids & bronchodilators.   Acute hypoxic respiratory failure: secondary to above. Continue on supplemental oxygen and wean as tolerated  HTN: continue on home dose of amlodipine  Schizophrenia: unknown type or severity. Continue on home dose of abilify,  seroquel     DVT prophylaxis: lovenox  Code Status: full  Family Communication:  Disposition Plan: likely d/c back home   Level of care: Progressive Cardiac   Status is: Observation  The patient remains OBS appropriate and will d/c before 2 midnights.  Dispo: The patient is from: Home              Anticipated d/c is to: Home              Patient currently is not medically stable to d/c.   Difficult to place patient Yes        Consultants:      Procedures:   Antimicrobials: azithromycin,ceftriaxone   Subjective: Pt c/o shortness of breath  Objective: Vitals:   04/21/20 0430 04/21/20 0500 04/21/20 0600 04/21/20 0800  BP: 128/78   (!) 135/91  Pulse: 99 99 90 89  Resp: (!) 24 16 (!) 30 (!) 22  Temp:      TempSrc:      SpO2: 100% 100% 99% 99%  Weight:      Height:        Intake/Output Summary (Last 24 hours) at 04/21/2020 0828 Last data filed at 04/21/2020 3419 Gross per 24 hour  Intake 250 ml  Output --  Net 250 ml   Filed Weights   04/21/20 0335  Weight: 59 kg    Examination:  General exam: Appears calm but uncomfortable. Appears disheveled  Respiratory system: diminished breath sounds b/l Cardiovascular system: S1 & S2 +. No  rubs, gallops or clicks.  Gastrointestinal system: Abdomen is nondistended, soft and nontender.Normal bowel sounds heard. Central nervous system: Alert and oriented. Moves all 4 extremities  Psychiatry: Judgement  and insight appear normal. Flat mood and affect    Data Reviewed: I have personally reviewed following labs and imaging studies  CBC: Recent Labs  Lab 04/21/20 0344  WBC 8.2  NEUTROABS 5.0  HGB 14.3  HCT 41.9  MCV 89.7  PLT 161   Basic Metabolic Panel: Recent Labs  Lab 04/21/20 0344  NA 139  K 4.5  CL 104  CO2 27  GLUCOSE 119*  BUN 18  CREATININE 1.11  CALCIUM 9.2   GFR: Estimated Creatinine Clearance: 51.7 mL/min (by C-G formula based on SCr of 1.11 mg/dL). Liver Function Tests: No  results for input(s): AST, ALT, ALKPHOS, BILITOT, PROT, ALBUMIN in the last 168 hours. No results for input(s): LIPASE, AMYLASE in the last 168 hours. No results for input(s): AMMONIA in the last 168 hours. Coagulation Profile: No results for input(s): INR, PROTIME in the last 168 hours. Cardiac Enzymes: No results for input(s): CKTOTAL, CKMB, CKMBINDEX, TROPONINI in the last 168 hours. BNP (last 3 results) No results for input(s): PROBNP in the last 8760 hours. HbA1C: No results for input(s): HGBA1C in the last 72 hours. CBG: No results for input(s): GLUCAP in the last 168 hours. Lipid Profile: No results for input(s): CHOL, HDL, LDLCALC, TRIG, CHOLHDL, LDLDIRECT in the last 72 hours. Thyroid Function Tests: No results for input(s): TSH, T4TOTAL, FREET4, T3FREE, THYROIDAB in the last 72 hours. Anemia Panel: No results for input(s): VITAMINB12, FOLATE, FERRITIN, TIBC, IRON, RETICCTPCT in the last 72 hours. Sepsis Labs: Recent Labs  Lab 04/21/20 0433 04/21/20 0624 04/21/20 0626  PROCALCITON  --  <0.10  --   LATICACIDVEN 1.1  --  1.1    Recent Results (from the past 240 hour(s))  Resp Panel by RT-PCR (Flu A&B, Covid) Nasopharyngeal Swab     Status: None   Collection Time: 04/21/20  3:43 AM   Specimen: Nasopharyngeal Swab; Nasopharyngeal(NP) swabs in vial transport medium  Result Value Ref Range Status   SARS Coronavirus 2 by RT PCR NEGATIVE NEGATIVE Final    Comment: (NOTE) SARS-CoV-2 target nucleic acids are NOT DETECTED.  The SARS-CoV-2 RNA is generally detectable in upper respiratory specimens during the acute phase of infection. The lowest concentration of SARS-CoV-2 viral copies this assay can detect is 138 copies/mL. A negative result does not preclude SARS-Cov-2 infection and should not be used as the sole basis for treatment or other patient management decisions. A negative result may occur with  improper specimen collection/handling, submission of specimen  other than nasopharyngeal swab, presence of viral mutation(s) within the areas targeted by this assay, and inadequate number of viral copies(<138 copies/mL). A negative result must be combined with clinical observations, patient history, and epidemiological information. The expected result is Negative.  Fact Sheet for Patients:  EntrepreneurPulse.com.au  Fact Sheet for Healthcare Providers:  IncredibleEmployment.be  This test is no t yet approved or cleared by the [place] FDA and  has been authorized for detection and/or diagnosis of SARS-CoV-2 by FDA under an Emergency Use Authorization (EUA). This EUA will remain  in effect (meaning this test can be used) for the duration of the COVID-19 declaration under Section 564(b)(1) of the Act, 21 U.S.C.section 360bbb-3(b)(1), unless the authorization is terminated  or revoked sooner.       Influenza A by PCR NEGATIVE NEGATIVE Final   Influenza B by PCR NEGATIVE NEGATIVE Final    Comment: (NOTE) The Xpert Xpress SARS-CoV-2/FLU/RSV plus assay is intended as an aid in the diagnosis of influenza from Nasopharyngeal swab  specimens and should not be used as a sole basis for treatment. Nasal washings and aspirates are unacceptable for Xpert Xpress SARS-CoV-2/FLU/RSV testing.  Fact Sheet for Patients: EntrepreneurPulse.com.au  Fact Sheet for Healthcare Providers: IncredibleEmployment.be  This test is not yet approved or cleared by the [place] FDA and has been authorized for detection and/or diagnosis of SARS-CoV-2 by FDA under an Emergency Use Authorization (EUA). This EUA will remain in effect (meaning this test can be used) for the duration of the COVID-19 declaration under Section 564(b)(1) of the Act, 21 U.S.C. section 360bbb-3(b)(1), unless the authorization is terminated or revoked.  Performed at [facility], [address]  [place], [place] [address]          Radiology Studies: DG Chest Portable 1 View  Result Date: 04/21/2020 CLINICAL DATA:  70 year old male with shortness of breath. Respiratory distress. EXAM: PORTABLE CHEST 1 VIEW COMPARISON:  Chest radiographs 04/11/2020. Chest CT 10/15/2019 and earlier. FINDINGS: Portable AP upright view at 0341 hours. Lower lung volumes. Chronic pulmonary hyperinflation with centrilobular emphysema demonstrated on prior exams. Mediastinal contours remain normal. Visualized tracheal air column is within normal limits. No pneumothorax, pulmonary edema, or pleural effusion. Patchy peribronchial opacity in the left upper lobe is new. Questionable similar peribronchial opacity also at the medial left lung base. Elsewhere lung markings are stable. No acute osseous abnormality identified. IMPRESSION: Chronic pulmonary hyperinflation with patchy left lung peribronchial opacity compatible with acute infectious exacerbation / bronchopneumonia. No pleural effusion is evident. Electronically Signed   By: Genevie Ann M.D.   On: 04/21/2020 04:11        Scheduled Meds: . enoxaparin (LOVENOX) injection  40 mg Subcutaneous Q24H  . ipratropium-albuterol  3 mL Nebulization Q6H  . methylPREDNISolone (SOLU-MEDROL) injection  40 mg Intravenous Q6H   Followed by  . [START ON 04/22/2020] predniSONE  40 mg Oral Q breakfast   Continuous Infusions: . cefTRIAXone (ROCEPHIN)  IV    . vancomycin 1,000 mg (04/21/20 0751)     LOS: 0 days    Time spent: 33 mins     Wyvonnia Dusky, MD [facility] Pager [phone]  If 7PM-7AM, please contact night-coverage 04/21/2020, 8:28 AM

## 2020-04-21 NOTE — ED Notes (Signed)
Pt given cola. Pt has no other needs at this time.

## 2020-04-22 DIAGNOSIS — I2510 Atherosclerotic heart disease of native coronary artery without angina pectoris: Secondary | ICD-10-CM | POA: Diagnosis present

## 2020-04-22 DIAGNOSIS — Z79899 Other long term (current) drug therapy: Secondary | ICD-10-CM | POA: Diagnosis not present

## 2020-04-22 DIAGNOSIS — Z888 Allergy status to other drugs, medicaments and biological substances status: Secondary | ICD-10-CM | POA: Diagnosis not present

## 2020-04-22 DIAGNOSIS — J9600 Acute respiratory failure, unspecified whether with hypoxia or hypercapnia: Secondary | ICD-10-CM | POA: Diagnosis not present

## 2020-04-22 DIAGNOSIS — F17210 Nicotine dependence, cigarettes, uncomplicated: Secondary | ICD-10-CM | POA: Diagnosis present

## 2020-04-22 DIAGNOSIS — F209 Schizophrenia, unspecified: Secondary | ICD-10-CM | POA: Diagnosis present

## 2020-04-22 DIAGNOSIS — I10 Essential (primary) hypertension: Secondary | ICD-10-CM | POA: Diagnosis present

## 2020-04-22 DIAGNOSIS — J441 Chronic obstructive pulmonary disease with (acute) exacerbation: Secondary | ICD-10-CM

## 2020-04-22 DIAGNOSIS — Z8701 Personal history of pneumonia (recurrent): Secondary | ICD-10-CM | POA: Diagnosis not present

## 2020-04-22 DIAGNOSIS — J9621 Acute and chronic respiratory failure with hypoxia: Secondary | ICD-10-CM | POA: Diagnosis present

## 2020-04-22 DIAGNOSIS — E785 Hyperlipidemia, unspecified: Secondary | ICD-10-CM | POA: Diagnosis present

## 2020-04-22 DIAGNOSIS — Z20822 Contact with and (suspected) exposure to covid-19: Secondary | ICD-10-CM | POA: Diagnosis present

## 2020-04-22 DIAGNOSIS — J440 Chronic obstructive pulmonary disease with acute lower respiratory infection: Secondary | ICD-10-CM | POA: Diagnosis present

## 2020-04-22 DIAGNOSIS — J180 Bronchopneumonia, unspecified organism: Secondary | ICD-10-CM | POA: Diagnosis present

## 2020-04-22 LAB — CBC
HCT: 41.4 % (ref 39.0–52.0)
Hemoglobin: 13.7 g/dL (ref 13.0–17.0)
MCH: 29.8 pg (ref 26.0–34.0)
MCHC: 33.1 g/dL (ref 30.0–36.0)
MCV: 90 fL (ref 80.0–100.0)
Platelets: 210 10*3/uL (ref 150–400)
RBC: 4.6 MIL/uL (ref 4.22–5.81)
RDW: 13.2 % (ref 11.5–15.5)
WBC: 15 10*3/uL — ABNORMAL HIGH (ref 4.0–10.5)
nRBC: 0 % (ref 0.0–0.2)

## 2020-04-22 LAB — BRAIN NATRIURETIC PEPTIDE: B Natriuretic Peptide: 82.6 pg/mL (ref 0.0–100.0)

## 2020-04-22 LAB — BASIC METABOLIC PANEL
Anion gap: 8 (ref 5–15)
BUN: 19 mg/dL (ref 8–23)
CO2: 27 mmol/L (ref 22–32)
Calcium: 9.7 mg/dL (ref 8.9–10.3)
Chloride: 105 mmol/L (ref 98–111)
Creatinine, Ser: 1.01 mg/dL (ref 0.61–1.24)
GFR, Estimated: >60 mL/min (ref >60)
Glucose, Bld: 157 mg/dL — ABNORMAL HIGH (ref 70–99)
Potassium: 4.6 mmol/L (ref 3.5–5.1)
Sodium: 140 mmol/L (ref 135–145)

## 2020-04-22 MED ORDER — HYDROXYZINE HCL 25 MG PO TABS
25.0000 mg | ORAL_TABLET | Freq: Four times a day (QID) | ORAL | Status: DC | PRN
  Administered 2020-04-22 – 2020-04-23 (×3): 25 mg via ORAL
  Filled 2020-04-22 (×3): qty 1

## 2020-04-22 MED ORDER — ADULT MULTIVITAMIN W/MINERALS CH
1.0000 | ORAL_TABLET | Freq: Every day | ORAL | Status: DC
  Administered 2020-04-23: 1 via ORAL
  Filled 2020-04-22: qty 1

## 2020-04-22 MED ORDER — ENSURE ENLIVE PO LIQD
237.0000 mL | Freq: Three times a day (TID) | ORAL | Status: DC
  Administered 2020-04-22 – 2020-04-23 (×3): 237 mL via ORAL

## 2020-04-22 MED ORDER — METHYLPREDNISOLONE SODIUM SUCC 40 MG IJ SOLR
40.0000 mg | Freq: Every day | INTRAMUSCULAR | Status: DC
  Administered 2020-04-22 – 2020-04-23 (×2): 40 mg via INTRAVENOUS
  Filled 2020-04-22 (×2): qty 1

## 2020-04-22 NOTE — TOC Initial Note (Addendum)
Transition of Care [facility]) - Initial/Assessment Note    Patient Details  Name: Carlos Nelson MRN: [id number] Date of Birth: November 30, 1950  Transition of [facility]) CM/SW Contact:    Candie Chroman, LCSW Phone Number: 04/22/2020, 9:57 AM  Clinical Narrative:  CSW met with patient. No supports at bedside. CSW introduced role and explained that discharge planning would be discussed. Patient confirmed he is from [place]. He is not on oxygen at the facility but uses a CPAP at night. No further concerns. CSW encouraged patient to contact CSW as needed. CSW will continue to follow patient for support and facilitate return to [place] when medically stable.                11:45 am: CSW spoke with Angelena Sole from patient's [facility]. She confirmed patient is not on oxygen at the facility and not on CPAP. Patient gets breathing treatments at nighttime. She said patient was supposed to be getting home health and someone came out once but she was not at the facility and could not tell me who. Unable to find in the chart other than outpatient palliative services through [facility].  3:00 pm: Per MD, patient may be able to discharge tomorrow if stable. Angelena Sole is aware. They will do their best to pick him up but if they cannot, will need to arrange [facility] or a cab.  Expected Discharge Plan: Group Home Barriers to Discharge: Continued Medical Work up   Patient Goals and CMS Choice        Expected Discharge Plan and Services Expected Discharge Plan: Group Home     Post Acute Care Choice: Resumption of Svcs/PTA Provider Living arrangements for the past 2 months: Group Home                                      Prior Living Arrangements/Services Living arrangements for the past 2 months: Group Home Lives with:: Facility Resident Patient language and need for interpreter reviewed:: Yes Do you feel safe going back to the place where you live?: Yes       Need for Family Participation in Patient Care: Yes (Comment) Care giver support system in place?: Yes (comment)   Criminal Activity/Legal Involvement Pertinent to Current Situation/Hospitalization: No - Comment as needed  Activities of Daily Living Home Assistive Devices/Equipment: None ADL Screening (condition at time of admission) Patient's cognitive ability adequate to safely complete daily activities?: Yes Is the patient deaf or have difficulty hearing?: No Does the patient have difficulty seeing, even when wearing glasses/contacts?: No Does the patient have difficulty concentrating, remembering, or making decisions?: No Patient able to express need for assistance with ADLs?: Yes Does the patient have difficulty dressing or bathing?: No Independently performs ADLs?: Yes (appropriate for developmental age) Does the patient have difficulty walking or climbing stairs?: No Weakness of Legs: None Weakness of Arms/Hands: None  Permission Sought/Granted Permission sought to share information with : Facility Art therapist granted to share information with : Yes, Verbal Permission Granted     Permission granted to share info w AGENCY: [facility]        Emotional Assessment Appearance:: Appears stated age Attitude/Demeanor/Rapport: Engaged,Gracious Affect (typically observed): Accepting,Appropriate,Calm,Pleasant Orientation: : Oriented to Self,Oriented to Place,Oriented to  Time,Oriented to Situation Alcohol / Substance Use: Not Applicable Psych Involvement: No (comment)  Admission diagnosis:  Bronchopneumonia [J18.0] COPD exacerbation ([place]) [J44.1] Acute respiratory failure with hypoxia ([place]) [J96.01] COPD with acute exacerbation ([place]) [J44.1] Patient Active Problem List   Diagnosis Date Noted  . Protein-calorie malnutrition, severe 03/18/2020  . COPD with acute exacerbation ([place]) 03/10/2020  . Right-sided chest pain 03/10/2020  . HLD  (hyperlipidemia) 01/18/2020  . CAD (coronary artery disease) 01/18/2020  . Hypertensive urgency 01/18/2020  . Tobacco abuse 01/18/2020  . Scrotum pain   . Transaminitis   . Demand ischemia ([place]) 06/11/2019  . Malnutrition of moderate degree 06/10/2019  . NSTEMI (non-ST elevated myocardial infarction) ([place]) 06/09/2019  . Depression with anxiety 06/09/2019  . Pleuritic chest pain 05/20/2019  . CAP (community acquired pneumonia) 05/20/2019  . Hypertension   . COPD exacerbation ([place])   . Acute respiratory failure ([place])   . Altered mental status 09/25/2018  . Schizophrenia ([place])   . Tachycardia 08/21/2018  . Schizophrenia, chronic with acute exacerbation ([place]) 08/17/2018  . Elevated PSA 05/24/2015   PCP:  [facility]:   [place], [place] [place] [address] Phone: [phone] Fax: [phone]     Social Determinants of Health (SDOH) Interventions    Readmission Risk Interventions No flowsheet data found.

## 2020-04-22 NOTE — Plan of Care (Signed)

## 2020-04-22 NOTE — Care Management Obs Status (Signed)
[place] NOTIFICATION   Patient Details  Name: Carlos Nelson MRN: [id number] Date of Birth: [birth year]   Medicare Observation Status Notification Given:  Yes    Candie Chroman, LCSW 04/22/2020, 9:56 AM

## 2020-04-22 NOTE — Progress Notes (Signed)
PROGRESS NOTE   HPI was taken from Dr. Damita Dunnings: Carlos Nelson is a 70 y.o. male with medical history significant for HTN, COPD, schizophrenia hospitalized monthly for the past 3 months with COPD exacerbation and respiratory failure, who was sent from the group home tonight with shortness of breath typical for his exacerbations not responding to home bronchodilators.  He was hypoxic with EMS and placed on oxygen receiving duo nebs in route.  He denied chest pain, fever or chills.  Denies nausea vomiting, abdominal pain, change in bowel habits ED Course: On arrival BP 141/89 tachycardic at 107, tachypneic at 33 but afebrile.  Blood work unremarkable.  Venous blood gas with pH 7.33, PCO2 58, EKG as reviewed by me : NSR at 99 with nonspecific ST-T wave changes Imaging: Chest x-ray: Chronic pulmonary hyperinflation with patchy left lung peribronchial opacity compatible with acute infectious exacerbation / bronchopneumonia. No pleural effusion is evident  Due to increased work of breathing, patient speaking in short sentences, hypoxic with EMS, he was placed on BiPAP in the ER.  Continue treatment with duo nebs Solu-Medrol.  Hospitalist consulted for admission.  4/8 feels shortness of breath when walking around his room, currently not at baseline yet.  Feels chest tightness.  Carlos Nelson  ZGY:[id number] DOB: November 21, 1950 DOA: 04/21/2020 [facility]CP: [facility]  Assessment & Plan:   Active Problems:   Schizophrenia, chronic with acute exacerbation (HCC)   Hypertension   COPD exacerbation (HCC)   Acute respiratory failure (HCC)   CAD (coronary artery disease)   COPD with acute exacerbation (HCC)   Acute COPD exacerbation:  Still wheezing on exam.  With dyspnea on exertion.   We will continue IV steroids  Inhalers  Continue IV antibiotics  Encourage I-S    Pneumonia: Continue IV antibiotics Continue I-S   Acute hypoxic respiratory failure: Secondary to above  Has  weaned off of oxygen however work of breathing increased with ambulation on room air  Continue I-S and IV antibiotics  Continue IV steroids    HTN: Continue amlodipine and Avapro   Schizophrenia: unknown type or severity.  Continue Abilify and Seroquel        DVT prophylaxis: lovenox  Code Status: full  Family Communication: None at bedside Disposition Plan: likely d/c back home   Level of care: Med-Surg   Status is: Observation  The patient remains OBS appropriate and will d/c before 2 midnights.  Dispo: The patient is from: Home              Anticipated d/c is to: Home              Patient currently is not medically stable to d/c.   Difficult to place patient Yes        Consultants:      Procedures:   Antimicrobials: azithromycin,ceftriaxone   Subjective: Feels chest tightness.  Work of breathing increased with ambulation.  Objective: Vitals:   04/22/20 0817 04/22/20 1217 04/22/20 1237 04/22/20 1404  BP: (!) 159/98 (!) 147/95 (!) 166/93   Pulse: (!) 102 (!) 102 (!) 109   Resp:  16    Temp: 97.8 F (36.6 C) 97.6 F (36.4 C)    TempSrc: Oral     SpO2: 93% 97% 97% 96%  Weight:      Height:        Intake/Output Summary (Last 24 hours) at 04/22/2020 1412 Last data filed at 04/22/2020 1405 Gross per 24 hour  Intake 360.44 ml  Output 5050  ml  Net -4689.56 ml   Filed Weights   04/21/20 0335  Weight: 59 kg    Examination:  Calm, NAD Expiratory wheezing, no rhonchi Regular S1-S2 no gallops Soft benign positive bowel sounds No edema Grossly intact awake and alert Mood and affect appropriate in current setting   Data Reviewed: I have personally reviewed following labs and imaging studies  CBC: Recent Labs  Lab 04/21/20 0344 04/22/20 0443  WBC 8.2 15.0*  NEUTROABS 5.0  --   HGB 14.3 13.7  HCT 41.9 41.4  MCV 89.7 90.0  PLT 218 528   Basic Metabolic Panel: Recent Labs  Lab 04/21/20 0344 04/22/20 0443  NA 139 140  K 4.5 4.6   CL 104 105  CO2 27 27  GLUCOSE 119* 157*  BUN 18 19  CREATININE 1.11 1.01  CALCIUM 9.2 9.7   GFR: Estimated Creatinine Clearance: 56 mL/min (by C-G formula based on SCr of 1.01 mg/dL). Liver Function Tests: No results for input(s): AST, ALT, ALKPHOS, BILITOT, PROT, ALBUMIN in the last 168 hours. No results for input(s): LIPASE, AMYLASE in the last 168 hours. No results for input(s): AMMONIA in the last 168 hours. Coagulation Profile: No results for input(s): INR, PROTIME in the last 168 hours. Cardiac Enzymes: No results for input(s): CKTOTAL, CKMB, CKMBINDEX, TROPONINI in the last 168 hours. BNP (last 3 results) No results for input(s): PROBNP in the last 8760 hours. HbA1C: No results for input(s): HGBA1C in the last 72 hours. CBG: No results for input(s): GLUCAP in the last 168 hours. Lipid Profile: No results for input(s): CHOL, HDL, LDLCALC, TRIG, CHOLHDL, LDLDIRECT in the last 72 hours. Thyroid Function Tests: No results for input(s): TSH, T4TOTAL, FREET4, T3FREE, THYROIDAB in the last 72 hours. Anemia Panel: No results for input(s): VITAMINB12, FOLATE, FERRITIN, TIBC, IRON, RETICCTPCT in the last 72 hours. Sepsis Labs: Recent Labs  Lab 04/21/20 0433 04/21/20 0624 04/21/20 0626  PROCALCITON  --  <0.10  --   LATICACIDVEN 1.1  --  1.1    Recent Results (from the past 240 hour(s))  Resp Panel by RT-PCR (Flu A&B, Covid) Nasopharyngeal Swab     Status: None   Collection Time: 04/21/20  3:43 AM   Specimen: Nasopharyngeal Swab; Nasopharyngeal(NP) swabs in vial transport medium  Result Value Ref Range Status   SARS Coronavirus 2 by RT PCR NEGATIVE NEGATIVE Final    Comment: (NOTE) SARS-CoV-2 target nucleic acids are NOT DETECTED.  The SARS-CoV-2 RNA is generally detectable in upper respiratory specimens during the acute phase of infection. The lowest concentration of SARS-CoV-2 viral copies this assay can detect is 138 copies/mL. A negative result does not preclude  SARS-Cov-2 infection and should not be used as the sole basis for treatment or other patient management decisions. A negative result may occur with  improper specimen collection/handling, submission of specimen other than nasopharyngeal swab, presence of viral mutation(s) within the areas targeted by this assay, and inadequate number of viral copies(<138 copies/mL). A negative result must be combined with clinical observations, patient history, and epidemiological information. The expected result is Negative.  Fact Sheet for Patients:  EntrepreneurPulse.com.au  Fact Sheet for Healthcare Providers:  IncredibleEmployment.be  This test is no t yet approved or cleared by the [place] FDA and  has been authorized for detection and/or diagnosis of SARS-CoV-2 by FDA under an Emergency Use Authorization (EUA). This EUA will remain  in effect (meaning this test can be used) for the duration of the COVID-19 declaration under Section  564(b)(1) of the Act, 21 U.S.C.section 360bbb-3(b)(1), unless the authorization is terminated  or revoked sooner.       Influenza A by PCR NEGATIVE NEGATIVE Final   Influenza B by PCR NEGATIVE NEGATIVE Final    Comment: (NOTE) The Xpert Xpress SARS-CoV-2/FLU/RSV plus assay is intended as an aid in the diagnosis of influenza from Nasopharyngeal swab specimens and should not be used as a sole basis for treatment. Nasal washings and aspirates are unacceptable for Xpert Xpress SARS-CoV-2/FLU/RSV testing.  Fact Sheet for Patients: EntrepreneurPulse.com.au  Fact Sheet for Healthcare Providers: IncredibleEmployment.be  This test is not yet approved or cleared by the [place] FDA and has been authorized for detection and/or diagnosis of SARS-CoV-2 by FDA under an Emergency Use Authorization (EUA). This EUA will remain in effect (meaning this test can be used) for the duration of  the COVID-19 declaration under Section 564(b)(1) of the Act, 21 U.S.C. section 360bbb-3(b)(1), unless the authorization is terminated or revoked.  Performed at [facility], [address] [place], [place] [address]   Blood culture (routine x 2)     Status: None (Preliminary result)   Collection Time: 04/21/20  4:33 AM   Specimen: BLOOD  Result Value Ref Range Status   Specimen Description BLOOD LEFT ANTECUBITAL  Final   Special Requests   Final    BOTTLES DRAWN AEROBIC AND ANAEROBIC Blood Culture adequate volume   Culture   Final    NO GROWTH 1 DAY Performed at [facility], [address] [place], [place] [address]    Report Status PENDING  Incomplete  Blood culture (routine x 2)     Status: None (Preliminary result)   Collection Time: 04/21/20  4:33 AM   Specimen: BLOOD  Result Value Ref Range Status   Specimen Description BLOOD BLOOD RIGHT FOREARM  Final   Special Requests   Final    BOTTLES DRAWN AEROBIC AND ANAEROBIC Blood Culture adequate volume   Culture   Final    NO GROWTH 1 DAY Performed at [facility], [address], [place], [place] [address]    Report Status PENDING  Incomplete         Radiology Studies: DG Chest Portable 1 View  Result Date: 04/21/2020 CLINICAL DATA:  70 year old male with shortness of breath. Respiratory distress. EXAM: PORTABLE CHEST 1 VIEW COMPARISON:  Chest radiographs 04/11/2020. Chest CT 10/15/2019 and earlier. FINDINGS: Portable AP upright view at 0341 hours. Lower lung volumes. Chronic pulmonary hyperinflation with centrilobular emphysema demonstrated on prior exams. Mediastinal contours remain normal. Visualized tracheal air column is within normal limits. No pneumothorax, pulmonary edema, or pleural effusion. Patchy peribronchial opacity in the left upper lobe is new. Questionable similar peribronchial opacity also at the medial left lung base. Elsewhere lung markings are stable. No acute osseous  abnormality identified. IMPRESSION: Chronic pulmonary hyperinflation with patchy left lung peribronchial opacity compatible with acute infectious exacerbation / bronchopneumonia. No pleural effusion is evident. Electronically Signed   By: Genevie Ann M.D.   On: 04/21/2020 04:11        Scheduled Meds: . amLODipine  10 mg Oral Daily  . ARIPiprazole  30 mg Oral Daily  . donepezil  5 mg Oral QHS  . enoxaparin (LOVENOX) injection  40 mg Subcutaneous Q24H  . feeding supplement  237 mL Oral TID BM  . ipratropium-albuterol  3 mL Nebulization Q6H  . irbesartan  150 mg Oral Daily  . [START ON 04/23/2020] multivitamin with minerals  1 tablet  Oral Daily  . predniSONE  40 mg Oral Q breakfast  . QUEtiapine  25 mg Oral Daily  . QUEtiapine  600 mg Oral QHS  . traZODone  100 mg Oral QHS   Continuous Infusions: . sodium chloride 10 mL/hr at 04/21/20 1804  . azithromycin 500 mg (04/22/20 0912)  . cefTRIAXone (ROCEPHIN)  IV Stopped (04/21/20 1804)     LOS: 0 days    Time spent: 35 mins >50% on coc    Nolberto Hanlon, MD [facility] Pager [phone]  If 7PM-7AM, please contact night-coverage 04/22/2020, 2:12 PM

## 2020-04-22 NOTE — Progress Notes (Signed)
Initial Nutrition Assessment  DOCUMENTATION CODES:   Severe malnutrition in context of chronic illness  INTERVENTION:   Ensure Enlive po TID, each supplement provides 350 kcal and 20 grams of protein  MVI po daily   Liberalize diet   NUTRITION DIAGNOSIS:   Severe Malnutrition related to catabolic illness (COPD) as evidenced by moderate fat depletion,severe muscle depletion.  GOAL:   Patient will meet greater than or equal to 90% of their needs  MONITOR:   PO intake,Supplement acceptance,Labs,Weight trends,Skin,I & O's  REASON FOR ASSESSMENT:   Consult COPD Protocol  ASSESSMENT:   70 y.o. male with medical history significant for HTN, COPD, schizophrenia, depression, anxiety and hospitalized monthly for the past 3 months with COPD exacerbation and respiratory failure who is admitted from his group home for COPD exacerbation.   RD familiar with this patient from numerous previous admits. Pt reports good appetite and oral intake pta and in hospital. Pt reports eating 100% of meals in hospital; pt was eating 100% of meals during his last admit also. Pt reports that he does drink chocolate and vanilla Ensure and that he would like to have this in hospital. RD will add supplements and MVI to help pt meet his estimated needs. Per chart, pt is weight stable pta.   Medications reviewed and include: lovenox, MVI, prednisone, azithromycin, ceftriaxone   Labs reviewed: wbc- 15.0(H)  NUTRITION - FOCUSED PHYSICAL EXAM:  Flowsheet Row Most Recent Value  Orbital Region Moderate depletion  Upper Arm Region Severe depletion  Thoracic and Lumbar Region Moderate depletion  Buccal Region Moderate depletion  Temple Region Severe depletion  Clavicle Bone Region Severe depletion  Clavicle and Acromion Bone Region Severe depletion  Scapular Bone Region Severe depletion  Dorsal Hand Severe depletion  Patellar Region Severe depletion  Anterior Thigh Region Severe depletion  Posterior Calf  Region Severe depletion  Edema (RD Assessment) None  Hair Reviewed  Eyes Reviewed  Mouth Reviewed  Skin Reviewed  Nails Reviewed     Diet Order:   Diet Order            Diet regular Room service appropriate? Yes; Fluid consistency: Thin  Diet effective now                EDUCATION NEEDS:   Education needs have been addressed  Skin:  Skin Assessment: Reviewed RN Assessment  Last BM:  pta  Height:   Ht Readings from Last 1 Encounters:  04/21/20 5\' 4"  (1.626 m)    Weight:   Wt Readings from Last 1 Encounters:  04/21/20 59 kg    Ideal Body Weight:  59 kg  BMI:  Body mass index is 22.31 kg/m.  Estimated Nutritional Needs:   Kcal:  1700-1900kcal/day  Protein:  85-95g/day  Fluid:  1.5-1.8L/day  Koleen Distance MS, RD, LDN Please refer to [facility] for RD and/or RD on-call/weekend/after hours pager

## 2020-04-22 NOTE — Progress Notes (Signed)
[facility] Hospital Liaison note:  This patient is currently enrolled in [facility] outpatient-based Palliative Care. Will continue to follow for disposition.  Please call with any outpatient palliative questions or concerns.  Thank you, Dee Curry, LPN [facility] Hospital Liaison [phone] 

## 2020-04-22 NOTE — Evaluation (Signed)
Occupational Therapy Evaluation Patient Details Name: Carlos Nelson MRN: [id number] DOB: [birth year] Today's Date: 04/22/2020    History of Present Illness Carlos Nelson is a 70 y.o. male with medical history significant for HTN, COPD, schizophrenia hospitalized monthly for the past 3 months with COPD exacerbation and respiratory failure, who was sent from the group home tonight with shortness of breath typical for his exacerbations not responding to home bronchodilators.  He was hypoxic with EMS and placed on oxygen receiving duo nebs in route.   Clinical Impression   Carlos Nelson was seen for OT evaluation this date. Prior to hospital admission, pt was Independent for mobility and ADLs. Pt lives at group home. Pt presents to acute OT demonstrating impaired ADL performance and functional mobility 2/2 decreased activity tolerance. Pt currently functioning near baseline [place], will sign off. Upon hospital discharge, recommend no OT follow up.  Pt ambulated on 2L [place] - SpO2 95%  Pt ambulated on RA - SpO2 90% - increased WOB     Follow Up Recommendations  No OT follow up    Equipment Recommendations  None recommended by OT    Recommendations for Other Services       Precautions / Restrictions Precautions Precautions: None Restrictions Weight Bearing Restrictions: No      Mobility Bed Mobility Overal bed mobility: Independent                  Transfers Overall transfer level: Modified independent               General transfer comment: MOD I using O2    Balance Overall balance assessment: No apparent balance deficits (not formally assessed)                                         ADL either performed or assessed with clinical judgement   ADL Overall ADL's : Modified independent                                       General ADL Comments: MOD I using O2 tank                  Pertinent Vitals/Pain Pain  Assessment: No/denies pain     Hand Dominance     Extremity/Trunk Assessment Upper Extremity Assessment Upper Extremity Assessment: Overall WFL for tasks assessed   Lower Extremity Assessment Lower Extremity Assessment: Overall WFL for tasks assessed   Cervical / Trunk Assessment Cervical / Trunk Assessment: Normal   Communication Communication Communication: Expressive difficulties;Other (comment)   Cognition Arousal/Alertness: Awake/alert Behavior During Therapy: WFL for tasks assessed/performed Overall Cognitive Status: Within Functional Limits for tasks assessed                                     General Comments       Exercises Exercises: Other exercises Other Exercises Other Exercises: Pt educated re: OT role, DME recs, d/c recs, falls prevention, ECS Other Exercises: LBD, UBD, sup<>sit, sit<>stand, sitting/standing balance/toleance, ~400 ft mobility   Shoulder Instructions      Home Living Family/patient expects to be discharged to:: Group home  Additional Comments: Reports group home is single-story with 3 steps, R rail to enter      Prior Functioning/Environment Level of Independence: Independent        Comments: reports no falls recently; AMB around yard, neigborhood.  No home O2.  Responsible for taking out trash at facility.        OT Problem List: Decreased activity tolerance         OT Goals(Current goals can be found in the care plan section) Acute Rehab OT Goals Patient Stated Goal: to return home, breathe better OT Goal Formulation: With patient Time For Goal Achievement: 05/06/20 Potential to Achieve Goals: Good                AM-PAC OT "6 Clicks" Daily Activity     Outcome Measure Help from another person eating meals?: None Help from another person taking care of personal grooming?: None Help from another person toileting, which includes using toliet, bedpan, or  urinal?: None Help from another person bathing (including washing, rinsing, drying)?: A Little Help from another person to put on and taking off regular upper body clothing?: None Help from another person to put on and taking off regular lower body clothing?: None 6 Click Score: 23   End of Session Equipment Utilized During Treatment: Oxygen  Activity Tolerance: Patient tolerated treatment well Patient left: in bed;with call bell/phone within reach  OT Visit Diagnosis: Muscle weakness (generalized) (M62.81)                Time: 0300-9233 OT Time Calculation (min): 12 min Charges:  OT General Charges $OT Visit: 1 Visit OT Evaluation $OT Eval Low Complexity: 1 Low  Dessie Coma, M.S. OTR/L  04/22/20, 1:27 PM  ascom [phone]

## 2020-04-23 ENCOUNTER — Inpatient Hospital Stay: Payer: Medicare Other

## 2020-04-23 DIAGNOSIS — I2510 Atherosclerotic heart disease of native coronary artery without angina pectoris: Secondary | ICD-10-CM

## 2020-04-23 LAB — CBC
HCT: 40.7 % (ref 39.0–52.0)
Hemoglobin: 13.3 g/dL (ref 13.0–17.0)
MCH: 29.8 pg (ref 26.0–34.0)
MCHC: 32.7 g/dL (ref 30.0–36.0)
MCV: 91.1 fL (ref 80.0–100.0)
Platelets: 215 10*3/uL (ref 150–400)
RBC: 4.47 MIL/uL (ref 4.22–5.81)
RDW: 13.6 % (ref 11.5–15.5)
WBC: 18.2 10*3/uL — ABNORMAL HIGH (ref 4.0–10.5)
nRBC: 0 % (ref 0.0–0.2)

## 2020-04-23 LAB — BASIC METABOLIC PANEL
Anion gap: 7 (ref 5–15)
BUN: 20 mg/dL (ref 8–23)
CO2: 30 mmol/L (ref 22–32)
Calcium: 9.4 mg/dL (ref 8.9–10.3)
Chloride: 102 mmol/L (ref 98–111)
Creatinine, Ser: 0.99 mg/dL (ref 0.61–1.24)
GFR, Estimated: >60 mL/min (ref >60)
Glucose, Bld: 95 mg/dL (ref 70–99)
Potassium: 4.2 mmol/L (ref 3.5–5.1)
Sodium: 139 mmol/L (ref 135–145)

## 2020-04-23 LAB — RESP PANEL BY RT-PCR (FLU A&B, COVID) ARPGX2
Influenza A by PCR: NEGATIVE
Influenza B by PCR: NEGATIVE
SARS Coronavirus 2 by RT PCR: NEGATIVE

## 2020-04-23 MED ORDER — PREDNISONE 20 MG PO TABS
20.0000 mg | ORAL_TABLET | Freq: Every day | ORAL | 0 refills | Status: DC
Start: 2020-04-24 — End: 2020-05-09

## 2020-04-23 MED ORDER — AMOXICILLIN-POT CLAVULANATE 875-125 MG PO TABS: 1.0000 | ORAL_TABLET | Freq: Two times a day (BID) | ORAL | 0 refills | Status: AC

## 2020-04-23 MED ORDER — ONDANSETRON HCL 4 MG/2ML IJ SOLN: 4.0000 mg | Freq: Four times a day (QID) | INTRAMUSCULAR | Status: DC | PRN

## 2020-04-23 MED ORDER — ALUM & MAG HYDROXIDE-SIMETH 200-200-20 MG/5ML PO SUSP
30.0000 mL | ORAL | Status: DC | PRN
  Administered 2020-04-23: 30 mL via ORAL
  Filled 2020-04-23: qty 30

## 2020-04-23 MED ORDER — PANTOPRAZOLE SODIUM 40 MG PO TBEC
40.0000 mg | DELAYED_RELEASE_TABLET | Freq: Every day | ORAL | Status: DC
  Administered 2020-04-23: 40 mg via ORAL
  Filled 2020-04-23: qty 1

## 2020-04-23 MED ORDER — FAMOTIDINE 20 MG PO TABS: 40.0000 mg | ORAL_TABLET | Freq: Every day | ORAL | Status: DC

## 2020-04-23 MED ORDER — ALBUTEROL SULFATE HFA 108 (90 BASE) MCG/ACT IN AERS: INHALATION_SPRAY | RESPIRATORY_TRACT | 0 refills | Status: DC

## 2020-04-23 MED ORDER — PREDNISONE 20 MG PO TABS: 40.0000 mg | ORAL_TABLET | Freq: Every day | ORAL | Status: DC

## 2020-04-23 NOTE — Progress Notes (Signed)
Carlos Nelson to be D/C'd to group home per MD order.  Discussed prescriptions and follow up appointments with the patient. Prescriptions given to patient, medication list explained in detail. Pt verbalized understanding.  Allergies as of 04/23/2020      Reactions   Ace Inhibitors Swelling   Ok with benazapril, per grouphome      Medication List    TAKE these medications   albuterol 108 (90 Base) MCG/ACT inhaler Commonly known as: VENTOLIN HFA Inhale 2 by mouth every 4 hours as needed for wheezing, cough, and/or shortness of breath What changed: additional instructions   amlodipine-olmesartan 10-20 MG tablet Commonly known as: AZOR Take 1 tablet by mouth daily.   amoxicillin-clavulanate 875-125 MG tablet Commonly known as: Augmentin Take 1 tablet by mouth 2 (two) times daily for 5 days.   ARIPiprazole 30 MG tablet Commonly known as: ABILIFY Take 30 mg by mouth daily.   docusate sodium 100 MG capsule Commonly known as: Colace Take 1 capsule (100 mg total) by mouth 2 (two) times daily.   donepezil 5 MG tablet Commonly known as: ARICEPT Take 5 mg by mouth at bedtime.   hydrOXYzine 50 MG tablet Commonly known as: ATARAX/VISTARIL Take 50 mg by mouth every 6 (six) hours as needed for anxiety or itching.   ipratropium-albuterol 0.5-2.5 (3) MG/3ML Soln Commonly known as: DUONEB Take 3 mLs by nebulization every 4 (four) hours for 14 days.   pantoprazole 20 MG tablet Commonly known as: Protonix Take 1 tablet (20 mg total) by mouth daily.   polyethylene glycol powder 17 GM/SCOOP powder Commonly known as: GLYCOLAX/MIRALAX Take 17 g by mouth daily as needed for moderate constipation.   predniSONE 20 MG tablet Commonly known as: DELTASONE Take 1 tablet (20 mg total) by mouth daily. Start taking on: April 24, 2020 What changed:   medication strength  how much to take  how to take this  when to take this  additional instructions   QUEtiapine 100 MG  tablet Commonly known as: SEROQUEL Take 100 mg by mouth daily.   QUEtiapine 200 MG tablet Commonly known as: SEROquel Take 3 tablets (600 mg total) by mouth at bedtime.   QUEtiapine 25 MG tablet Commonly known as: SEROQUEL Take 25 mg by mouth daily.   traZODone 100 MG tablet Commonly known as: DESYREL Take 100 mg by mouth at bedtime.   vitamin B-12 1000 MCG tablet Commonly known as: CYANOCOBALAMIN Take 1,000 mcg by mouth daily.       Vitals:   04/23/20 1148 04/23/20 1349  BP: (!) 163/90   Pulse: (!) 101   Resp: 18   Temp: 97.9 F (36.6 C)   SpO2: 95% 93%    Skin clean, dry and intact without evidence of skin break down, no evidence of skin tears noted. IV catheter discontinued intact. Site without signs and symptoms of complications. Dressing and pressure applied. Pt denies pain at this time. No complaints noted.  An After Visit Summary was printed and given to the patient. Patient escorted via [place], and D/C home via private auto.  Fuller Mandril, RN

## 2020-04-23 NOTE — Progress Notes (Signed)
COVID swab sent to lab.   Fuller Mandril, RN

## 2020-04-23 NOTE — TOC Progression Note (Addendum)
Transition of Care [facility]) - Progression Note    Patient Details  Name: Carlos Nelson MRN: [id number] Date of Birth: Jun 12, 1950  Transition of [facility] CM/SW [place], LCSW Phone Number: 04/23/2020, 9:25 AM  Clinical Narrative:   Per MD patient may be medically ready for DC today. CSW called [facility] and spoke to Angelena Sole. She reported she is unable to provide transportation if patient is discharged today, CSW will need to arrange transport to [address], [place], [place]. Tomi Bamberger reported patient will need an updated COVID test before returning. Tomi Bamberger will also need an FL2 in DC [place]. Tomi Bamberger reported RN does not need to call report. Updated RN and MD. Will complete FL2 when DC Summary is in and will arrange transport when appropriate.   Expected Discharge Plan: Group Home Barriers to Discharge: Continued Medical Work up  Expected Discharge Plan and Services Expected Discharge Plan: Group Home     Post Acute Care Choice: Resumption of Svcs/PTA Provider Living arrangements for the past 2 months: Group Home                                       Social Determinants of Health (SDOH) Interventions    Readmission Risk Interventions No flowsheet data found.

## 2020-04-23 NOTE — NC FL2 (Signed)
[place] LEVEL OF CARE SCREENING TOOL     IDENTIFICATION  Patient Name: Carlos Nelson Birthdate: [birth year] Sex: male Admission Date (Current Location): 04/21/2020  [place] and [place] Number:  Engineering geologist and Address:  [facility], [address] [place], [place] [address]      Provider Number: [id number]  Attending Physician Name and Address:  Nolberto Hanlon, MD  Relative Name and Phone Number:  Angelena Sole (Other)   [phone] [facility])    Current Level of Care: Hospital Recommended Level of Care: [facility] Prior Approval Number:    Date Approved/Denied:   PASRR Number:    Discharge Plan: Domiciliary (Rest home)    Current Diagnoses: Patient Active Problem List   Diagnosis Date Noted  . Protein-calorie malnutrition, severe 03/18/2020  . COPD with acute exacerbation ([place]) 03/10/2020  . Right-sided chest pain 03/10/2020  . HLD (hyperlipidemia) 01/18/2020  . CAD (coronary artery disease) 01/18/2020  . Hypertensive urgency 01/18/2020  . Tobacco abuse 01/18/2020  . Scrotum pain   . Transaminitis   . Demand ischemia ([place]) 06/11/2019  . Malnutrition of moderate degree 06/10/2019  . NSTEMI (non-ST elevated myocardial infarction) ([place]) 06/09/2019  . Depression with anxiety 06/09/2019  . Pleuritic chest pain 05/20/2019  . CAP (community acquired pneumonia) 05/20/2019  . Hypertension   . COPD exacerbation ([place])   . Acute respiratory failure ([place])   . Altered mental status 09/25/2018  . Schizophrenia ([place])   . Tachycardia 08/21/2018  . Schizophrenia, chronic with acute exacerbation ([place]) 08/17/2018  . Elevated PSA 05/24/2015    Orientation RESPIRATION BLADDER Height & Weight     Self,Time,Situation,Place  Normal Continent Weight: 130 lb (59 kg) Height:  5\' 4"  (162.6 cm)  BEHAVIORAL SYMPTOMS/MOOD NEUROLOGICAL BOWEL NUTRITION STATUS      Continent Diet (regular diet, thin liquids)  AMBULATORY STATUS  COMMUNICATION OF NEEDS Skin   Supervision Verbally Normal                       Personal Care Assistance Level of Assistance  Bathing,Feeding,Dressing Bathing Assistance: Independent Feeding assistance: Independent Dressing Assistance: Independent     Functional Limitations Info             SPECIAL CARE FACTORS FREQUENCY                       Contractures      Additional Factors Info  Code Status,Allergies Code Status Info: full code Allergies Info: ace inhibitors           Current Medications (04/23/2020):   Medication List    TAKE these medications   albuterol 108 (90 Base) MCG/ACT inhaler Commonly known as: VENTOLIN HFA Inhale 2 by mouth every 4 hours as needed for wheezing, cough, and/or shortness of breath What changed: additional instructions   amlodipine-olmesartan 10-20 MG tablet Commonly known as: AZOR Take 1 tablet by mouth daily.   amoxicillin-clavulanate 875-125 MG tablet Commonly known as: Augmentin Take 1 tablet by mouth 2 (two) times daily for 5 days.   ARIPiprazole 30 MG tablet Commonly known as: ABILIFY Take 30 mg by mouth daily.   docusate sodium 100 MG capsule Commonly known as: Colace Take 1 capsule (100 mg total) by mouth 2 (two) times daily.   donepezil 5 MG tablet Commonly known as: ARICEPT Take 5 mg by mouth at bedtime.   hydrOXYzine 50 MG tablet Commonly known as: ATARAX/VISTARIL Take 50 mg by  mouth every 6 (six) hours as needed for anxiety or itching.   ipratropium-albuterol 0.5-2.5 (3) MG/3ML Soln Commonly known as: DUONEB Take 3 mLs by nebulization every 4 (four) hours for 14 days.   pantoprazole 20 MG tablet Commonly known as: Protonix Take 1 tablet (20 mg total) by mouth daily.   polyethylene glycol powder 17 GM/SCOOP powder Commonly known as: GLYCOLAX/MIRALAX Take 17 g by mouth daily as needed for moderate constipation.   predniSONE 20 MG tablet Commonly known as: DELTASONE Take 1 tablet  (20 mg total) by mouth daily. Start taking on: April 24, 2020 What changed:   medication strength  how much to take  how to take this  when to take this  additional instructions   QUEtiapine 100 MG tablet Commonly known as: SEROQUEL Take 100 mg by mouth daily.   QUEtiapine 200 MG tablet Commonly known as: SEROquel Take 3 tablets (600 mg total) by mouth at bedtime.   QUEtiapine 25 MG tablet Commonly known as: SEROQUEL Take 25 mg by mouth daily.   traZODone 100 MG tablet Commonly known as: DESYREL Take 100 mg by mouth at bedtime.   vitamin B-12 1000 MCG tablet Commonly known as: CYANOCOBALAMIN Take 1,000 mcg by mouth daily.     Discharge Medications: Please see discharge summary for a list of discharge medications.  Relevant Imaging Results:  Relevant Lab Results:   Additional Information SS #: [id number]  [place], LCSW

## 2020-04-23 NOTE — Plan of Care (Signed)
  Problem: Health Behavior/Discharge Planning: Goal: Ability to manage health-related needs will improve Outcome: Progressing   Problem: Clinical Measurements: Goal: Respiratory complications will improve Outcome: Progressing   Problem: Activity: Goal: Risk for activity intolerance will decrease Outcome: Progressing   Problem: Nutrition: Goal: Adequate nutrition will be maintained Outcome: Progressing   Problem: Coping: Goal: Level of anxiety will decrease Outcome: Progressing   Problem: Pain Managment: Goal: General experience of comfort will improve Outcome: Progressing   Problem: Safety: Goal: Ability to remain free from injury will improve Outcome: Progressing

## 2020-04-23 NOTE — TOC Transition Note (Signed)
Transition of Care [facility]) - CM/SW Discharge Note   Patient Details  Name: Carlos Nelson MRN: [id number] Date of Birth: 11/24/1950  Transition of Care [facility]) CM/SW Contact:  Magnus Ivan, LCSW Phone Number: 04/23/2020, 1:56 PM   Clinical Narrative:   Patient discharging back to [facility] today. CSW called and confirmed with Angelena Sole at [facility]. Tomi Bamberger declined need for RN to call report. Tomi Bamberger asked for updated COVID test which was completed by RN. Tomi Bamberger reported they do not have transportation for patient today. Updated FL2 completed, printed FL2 and DC Summary, Nurse Secretary is placing in [place] folder. [facility] arranged with Representative Marcille Blanco at 1:40 pm, Retail banker and RN notified. RN to have patient sign Buyer, retail and place in patient's chart.     Final next level of care: Group Home Barriers to Discharge: Barriers Resolved   Patient Goals and CMS Choice Patient states their goals for this hospitalization and ongoing recovery are:: return to [facility] [place]   Choice offered to / list presented to : Patient  Discharge Placement              Patient chooses bed at:  ([facility] (patient's home)) Patient to be transferred to facility by: [facility] Name of family member notified: Angelena Sole - [facility] staff member Patient and family notified of of transfer: 04/23/20  Discharge Plan and Services     Post Acute Care Choice: Resumption of Svcs/PTA Provider                               Social Determinants of Health (SDOH) Interventions     Readmission Risk Interventions No flowsheet data found.

## 2020-04-23 NOTE — Discharge Summary (Signed)
Carlos Nelson:[id number] DOB: Aug 21, 1950 DOA: 04/21/2020  PCP: [facility]  Admit date: 04/21/2020 Discharge date: 04/23/2020  Admitted From: Group home Disposition: Group home  Recommendations for Outpatient Follow-up:  1. Follow up with PCP in 1 week 2. Please obtain BMP/CBC in one week      Discharge Condition:Stable CODE STATUS: Full Diet recommendation: Heart Healthy  Brief/Interim Summary: Per EXB:MWUXLKG W Carlos Nelson is a 70 y.o. male with medical history significant for HTN, COPD, schizophrenia hospitalized monthly for the past 3 months with COPD exacerbation and respiratory failure, who was sent from the group home tonight with shortness of breath typical for his exacerbations not responding to home bronchodilators.  He was hypoxic with EMS and placed on oxygen receiving duo nebs in route.  He denied chest pain, fever or chills.  Denies nausea vomiting, abdominal pain, change in bowel habits.  He had a chest x-ray revealing  Chronic pulmonary hyperinflation with patchy left lung peribronchial opacity compatible with acute infectious exacerbation / bronchopneumonia. No pleural effusion is evident  He was admitted to the hospital for acute COPD exacerbation.  Acute COPD exacerbation:  Improved with IV steroids, no further wheezing.  Now on RA sat 90% and up with ambulation IV steroid  will switch to p.o. on discharge taper PPI for GI prophylaxis   Pneumonia:  Was started on IV ceftriaxone and azithromycin and switched to p.o. antibiotics on discharge to complete course     Acute hypoxic respiratory failure: Secondary to above  Weaned off of oxygen Continue p.o. prednisone taper as outpatient Continue p.o. antibiotic to complete course   HTN: Continue amlodipine and Avapro   Schizophrenia: unknown type or severity.  Continue Abilify and Seroquel   Vomited - this am small amount of fluid like vomitus. No abdominal pain.  Abdominal xr  was negative. Once patient had bowel movement no further vomiting occurred.  Tolerated po intake.  Discharge Diagnoses:  Active Problems:   Schizophrenia, chronic with acute exacerbation (HCC)   Hypertension   COPD exacerbation (HCC)   Acute respiratory failure (HCC)   CAD (coronary artery disease)   COPD with acute exacerbation [facility])    Discharge Instructions  Discharge Instructions    Call MD for:  difficulty breathing, headache or visual disturbances   Complete by: As directed    Call MD for:  temperature >100.4   Complete by: As directed    Diet - low sodium heart healthy   Complete by: As directed    Increase activity slowly   Complete by: As directed      Allergies as of 04/23/2020      Reactions   Ace Inhibitors Swelling   Ok with benazapril, per grouphome      Medication List    TAKE these medications   albuterol 108 (90 Base) MCG/ACT inhaler Commonly known as: VENTOLIN HFA Inhale 2 by mouth every 4 hours as needed for wheezing, cough, and/or shortness of breath What changed: additional instructions   amlodipine-olmesartan 10-20 MG tablet Commonly known as: AZOR Take 1 tablet by mouth daily.   amoxicillin-clavulanate 875-125 MG tablet Commonly known as: Augmentin Take 1 tablet by mouth 2 (two) times daily for 5 days.   ARIPiprazole 30 MG tablet Commonly known as: ABILIFY Take 30 mg by mouth daily.   docusate sodium 100 MG capsule Commonly known as: Colace Take 1 capsule (100 mg total) by mouth 2 (two) times daily.   donepezil 5 MG tablet Commonly known as: ARICEPT Take 5  mg by mouth at bedtime.   hydrOXYzine 50 MG tablet Commonly known as: ATARAX/VISTARIL Take 50 mg by mouth every 6 (six) hours as needed for anxiety or itching.   ipratropium-albuterol 0.5-2.5 (3) MG/3ML Soln Commonly known as: DUONEB Take 3 mLs by nebulization every 4 (four) hours for 14 days.   pantoprazole 20 MG tablet Commonly known as: Protonix Take 1 tablet (20 mg  total) by mouth daily.   polyethylene glycol powder 17 GM/SCOOP powder Commonly known as: GLYCOLAX/MIRALAX Take 17 g by mouth daily as needed for moderate constipation.   predniSONE 20 MG tablet Commonly known as: DELTASONE Take 1 tablet (20 mg total) by mouth daily. Start taking on: April 24, 2020 What changed:   medication strength  how much to take  how to take this  when to take this  additional instructions   QUEtiapine 100 MG tablet Commonly known as: SEROQUEL Take 100 mg by mouth daily.   QUEtiapine 200 MG tablet Commonly known as: SEROquel Take 3 tablets (600 mg total) by mouth at bedtime.   QUEtiapine 25 MG tablet Commonly known as: SEROQUEL Take 25 mg by mouth daily.   traZODone 100 MG tablet Commonly known as: DESYREL Take 100 mg by mouth at bedtime.   vitamin B-12 1000 MCG tablet Commonly known as: CYANOCOBALAMIN Take 1,000 mcg by mouth daily.       Follow-up Information    [facility] Follow up in 1 week(s).   Contact information: [place] [address] [phone]              Allergies  Allergen Reactions  . Ace Inhibitors Swelling    Ok with benazapril, per grouphome    Consultations:     Procedures/Studies: DG Chest 2 View  Result Date: 04/11/2020 CLINICAL DATA:  Chest pain.  Abdominal distention. EXAM: CHEST - 2 VIEW COMPARISON:  Chest radiograph 04/02/2020.  CT 09/14/2019 FINDINGS: Moderately advanced emphysema with chronic hyperinflation. Normal heart size and mediastinal contours. No acute airspace disease. No pleural effusion, pneumothorax, or pulmonary edema. No acute osseous abnormalities are seen. Chronic flattening of many of thoracic and upper lumbar vertebra. There is colonic interposition under the right hemidiaphragm. IMPRESSION: 1. No acute abnormality. 2. Moderately advanced emphysema. Electronically Signed   By: Keith Rake M.D.   On: 04/11/2020 23:54   DG Abd 1  View  Result Date: 04/23/2020 CLINICAL DATA:  Shortness of breath.  COPD. EXAM: ABDOMEN - 1 VIEW COMPARISON:  April 12, 2020 FINDINGS: The bowel gas pattern is normal. No radio-opaque calculi or other significant radiographic abnormality are seen. Osteoarthritic changes of the lower lumbosacral spine. IMPRESSION: Negative. Electronically Signed   By: Fidela Salisbury M.D.   On: 04/23/2020 12:19   CT ABDOMEN PELVIS W CONTRAST  Result Date: 04/12/2020 CLINICAL DATA:  Nausea and vomiting abdominal distension EXAM: CT ABDOMEN AND PELVIS WITH CONTRAST TECHNIQUE: Multidetector CT imaging of the abdomen and pelvis was performed using the standard protocol following bolus administration of intravenous contrast. Oral contrast also administered. CONTRAST:  67mL OMNIPAQUE IOHEXOL 300 MG/ML  SOLN COMPARISON:  June 23, 2019 FINDINGS: Lower chest: There is mild scarring in the lung bases. There is no lung base edema or consolidation. Hepatobiliary: There is a degree of periportal edema. No focal liver lesions are evident. Nitrogen containing gallstones are seen throughout the gallbladder. The gallbladder wall does not appear appreciably thickened. There is no biliary duct dilatation. Pancreas: There is no pancreatic mass or inflammatory focus. Spleen: No  splenic lesions are evident. Adrenals/Urinary Tract: Adrenals bilaterally appear unremarkable. There is a cyst in the lower pole of the right kidney measuring 1.3 x 1.2 cm. There is a cyst in the posterior upper to mid left kidney measuring 1.2 x 1.0 cm. There is no appreciable hydronephrosis on either side. There is no renal or ureteral calculus on either side. The urinary bladder is distended without wall thickening. Stomach/Bowel: There is diffuse stool throughout much of the colon. There is somewhat diffuse distention of small bowel with fluid and air without a transition zone to suggest bowel obstruction. Note that this appearance is similar to findings on prior  study. There is thickening in the wall of a portion of the gastric antrum. No ulceration is seen in this area. The terminal ileum region appears unremarkable. There is no apparent periappendiceal region inflammatory change. There is no demonstrable free air or portal venous air. Vascular/Lymphatic: There is no abdominal aortic aneurysm. There are scattered foci of calcification in iliac arteries. Major venous structures appear patent. No evident adenopathy in the abdomen or pelvis. Reproductive: Prostate is prominent and mildly inhomogeneous in attenuation. Seminal vesicles appear unremarkable. Other: No evident abscess or ascites in the abdomen or pelvis. Musculoskeletal: There is degenerative change in the lumbar spine. There is chronic anterior wedging at L1, stable. Disc degeneration is severe at L4-5 with marked disc space narrowing and sclerosis along each sides of the endplate at this level. This appearance is stable compared to the previous study. No blastic or lytic bone lesions are evident. No intramuscular or abdominal wall lesions are appreciable. IMPRESSION: 1. Diffuse stool in colon with suspected degree of constipation. Somewhat generalized distension of most loops of small bowel without transition zone. Question ileus. This appearance was also present on prior CT from June 2021. 2. Suspect a degree of antral gastritis. No ulceration is appreciable by CT. 3.  Cholelithiasis. 4. There is mild periportal edema of uncertain etiology. Appropriate laboratory correlation to assess for potential hepatic etiology advised. 5. Prostate is prominent and mildly inhomogeneous in attenuation. This finding warrants clinical assessment and PSA evaluation. 6. Urinary bladder distended. Question a degree of bladder outlet obstruction. Note that the urinary bladder wall is not thickened. Electronically Signed   By: Lowella Grip III M.D.   On: 04/12/2020 08:10   DG Chest Portable 1 View  Result Date:  04/21/2020 CLINICAL DATA:  70 year old male with shortness of breath. Respiratory distress. EXAM: PORTABLE CHEST 1 VIEW COMPARISON:  Chest radiographs 04/11/2020. Chest CT 10/15/2019 and earlier. FINDINGS: Portable AP upright view at 0341 hours. Lower lung volumes. Chronic pulmonary hyperinflation with centrilobular emphysema demonstrated on prior exams. Mediastinal contours remain normal. Visualized tracheal air column is within normal limits. No pneumothorax, pulmonary edema, or pleural effusion. Patchy peribronchial opacity in the left upper lobe is new. Questionable similar peribronchial opacity also at the medial left lung base. Elsewhere lung markings are stable. No acute osseous abnormality identified. IMPRESSION: Chronic pulmonary hyperinflation with patchy left lung peribronchial opacity compatible with acute infectious exacerbation / bronchopneumonia. No pleural effusion is evident. Electronically Signed   By: Genevie Ann M.D.   On: 04/21/2020 04:11   DG Chest Portable 1 View  Result Date: 04/02/2020 CLINICAL DATA:  70 year old male with shortness of breath. EXAM: PORTABLE CHEST 1 VIEW COMPARISON:  Chest radiograph dated 03/16/2020. FINDINGS: Assess mass changes of the lungs. No focal consolidation, pleural effusion, or pneumothorax. The cardiac silhouette is within limits. No acute osseous pathology. IMPRESSION: No active disease.  Electronically Signed   By: Anner Crete M.D.   On: 04/02/2020 03:48   DG Abd 2 Views  Result Date: 04/12/2020 CLINICAL DATA:  Abdominal distention. EXAM: ABDOMEN - 2 VIEW COMPARISON:  12/18/2019. FINDINGS: Large amount of stool noted throughout the colon. No bowel distention or free air noted. Air noted in the right upper quadrant most likely duodenal air. If symptoms persist CT of the abdomen pelvis can be obtained. Pelvic calcifications consistent phleboliths. Diffuse osteopenia. Degenerative changes lumbar spine and both hips. Mild bibasilar atelectasis. IMPRESSION:  1. Large amount of stool noted throughout the colon. No bowel distention or free air noted. 2. Air noted in the right upper quadrant most likely duodenal air. If symptoms persist CT of the abdomen pelvis can be obtained. Electronically Signed   By: Marcello Moores  Register   On: 04/12/2020 05:19       Subjective: Feels better, no sob.   Discharge Exam: Vitals:   04/23/20 0849 04/23/20 1148  BP: (!) 159/90 (!) 163/90  Pulse:  (!) 101  Resp: (!) 27 18  Temp: 97.8 F (36.6 C) 97.9 F (36.6 C)  SpO2: 96% 95%   Vitals:   04/23/20 0432 04/23/20 0739 04/23/20 0849 04/23/20 1148  BP: (!) 144/86  (!) 159/90 (!) 163/90  Pulse: (!) 110   (!) 101  Resp: 16  (!) 27 18  Temp: 97.9 F (36.6 C)  97.8 F (36.6 C) 97.9 F (36.6 C)  TempSrc:   Oral Oral  SpO2: 94% 92% 96% 95%  Weight:      Height:        General: Pt is alert, awake, not in acute distress Cardiovascular: RRR, S1/S2 +, no rubs, no gallops Respiratory: CTA bilaterally, no wheezing, no rhonchi Abdominal: Soft, NT, ND, bowel sounds + Extremities: no edema    The results of significant diagnostics from this hospitalization (including imaging, microbiology, ancillary and laboratory) are listed below for reference.     Microbiology: Recent Results (from the past 240 hour(s))  Resp Panel by RT-PCR (Flu A&B, Covid) Nasopharyngeal Swab     Status: None   Collection Time: 04/21/20  3:43 AM   Specimen: Nasopharyngeal Swab; Nasopharyngeal(NP) swabs in vial transport medium  Result Value Ref Range Status   SARS Coronavirus 2 by RT PCR NEGATIVE NEGATIVE Final    Comment: (NOTE) SARS-CoV-2 target nucleic acids are NOT DETECTED.  The SARS-CoV-2 RNA is generally detectable in upper respiratory specimens during the acute phase of infection. The lowest concentration of SARS-CoV-2 viral copies this assay can detect is 138 copies/mL. A negative result does not preclude SARS-Cov-2 infection and should not be used as the sole basis for  treatment or other patient management decisions. A negative result may occur with  improper specimen collection/handling, submission of specimen other than nasopharyngeal swab, presence of viral mutation(s) within the areas targeted by this assay, and inadequate number of viral copies(<138 copies/mL). A negative result must be combined with clinical observations, patient history, and epidemiological information. The expected result is Negative.  Fact Sheet for Patients:  EntrepreneurPulse.com.au  Fact Sheet for Healthcare Providers:  IncredibleEmployment.be  This test is no t yet approved or cleared by the [place] FDA and  has been authorized for detection and/or diagnosis of SARS-CoV-2 by FDA under an Emergency Use Authorization (EUA). This EUA will remain  in effect (meaning this test can be used) for the duration of the COVID-19 declaration under Section 564(b)(1) of the Act, 21 U.S.C.section 360bbb-3(b)(1), unless the authorization is  terminated  or revoked sooner.       Influenza A by PCR NEGATIVE NEGATIVE Final   Influenza B by PCR NEGATIVE NEGATIVE Final    Comment: (NOTE) The Xpert Xpress SARS-CoV-2/FLU/RSV plus assay is intended as an aid in the diagnosis of influenza from Nasopharyngeal swab specimens and should not be used as a sole basis for treatment. Nasal washings and aspirates are unacceptable for Xpert Xpress SARS-CoV-2/FLU/RSV testing.  Fact Sheet for Patients: EntrepreneurPulse.com.au  Fact Sheet for Healthcare Providers: IncredibleEmployment.be  This test is not yet approved or cleared by the [place] FDA and has been authorized for detection and/or diagnosis of SARS-CoV-2 by FDA under an Emergency Use Authorization (EUA). This EUA will remain in effect (meaning this test can be used) for the duration of the COVID-19 declaration under Section 564(b)(1) of the Act, 21  U.S.C. section 360bbb-3(b)(1), unless the authorization is terminated or revoked.  Performed at [facility], [address] [place], [place] [address]   Blood culture (routine x 2)     Status: None (Preliminary result)   Collection Time: 04/21/20  4:33 AM   Specimen: BLOOD  Result Value Ref Range Status   Specimen Description BLOOD LEFT ANTECUBITAL  Final   Special Requests   Final    BOTTLES DRAWN AEROBIC AND ANAEROBIC Blood Culture adequate volume   Culture   Final    NO GROWTH 2 DAYS Performed at [facility], [address], [place], [place] [address]    Report Status PENDING  Incomplete  Blood culture (routine x 2)     Status: None (Preliminary result)   Collection Time: 04/21/20  4:33 AM   Specimen: BLOOD  Result Value Ref Range Status   Specimen Description BLOOD BLOOD RIGHT FOREARM  Final   Special Requests   Final    BOTTLES DRAWN AEROBIC AND ANAEROBIC Blood Culture adequate volume   Culture   Final    NO GROWTH 2 DAYS Performed at [facility], [address] [place], [place] [address]    Report Status PENDING  Incomplete  Resp Panel by RT-PCR (Flu A&B, Covid) Nasopharyngeal Swab     Status: None   Collection Time: 04/23/20 11:24 AM   Specimen: Nasopharyngeal Swab; Nasopharyngeal(NP) swabs in vial transport medium  Result Value Ref Range Status   SARS Coronavirus 2 by RT PCR NEGATIVE NEGATIVE Final    Comment: (NOTE) SARS-CoV-2 target nucleic acids are NOT DETECTED.  The SARS-CoV-2 RNA is generally detectable in upper respiratory specimens during the acute phase of infection. The lowest concentration of SARS-CoV-2 viral copies this assay can detect is 138 copies/mL. A negative result does not preclude SARS-Cov-2 infection and should not be used as the sole basis for treatment or other patient management decisions. A negative result may occur with  improper specimen collection/handling, submission of specimen other than  nasopharyngeal swab, presence of viral mutation(s) within the areas targeted by this assay, and inadequate number of viral copies(<138 copies/mL). A negative result must be combined with clinical observations, patient history, and epidemiological information. The expected result is Negative.  Fact Sheet for Patients:  EntrepreneurPulse.com.au  Fact Sheet for Healthcare Providers:  IncredibleEmployment.be  This test is no t yet approved or cleared by the [place] FDA and  has been authorized for detection and/or diagnosis of SARS-CoV-2 by FDA under an Emergency Use Authorization (EUA). This EUA will remain  in effect (meaning this test can be used) for the duration of the COVID-19 declaration under  Section 564(b)(1) of the Act, 21 U.S.C.section 360bbb-3(b)(1), unless the authorization is terminated  or revoked sooner.       Influenza A by PCR NEGATIVE NEGATIVE Final   Influenza B by PCR NEGATIVE NEGATIVE Final    Comment: (NOTE) The Xpert Xpress SARS-CoV-2/FLU/RSV plus assay is intended as an aid in the diagnosis of influenza from Nasopharyngeal swab specimens and should not be used as a sole basis for treatment. Nasal washings and aspirates are unacceptable for Xpert Xpress SARS-CoV-2/FLU/RSV testing.  Fact Sheet for Patients: EntrepreneurPulse.com.au  Fact Sheet for Healthcare Providers: IncredibleEmployment.be  This test is not yet approved or cleared by the [place] FDA and has been authorized for detection and/or diagnosis of SARS-CoV-2 by FDA under an Emergency Use Authorization (EUA). This EUA will remain in effect (meaning this test can be used) for the duration of the COVID-19 declaration under Section 564(b)(1) of the Act, 21 U.S.C. section 360bbb-3(b)(1), unless the authorization is terminated or revoked.  Performed at [facility], [address] [place], [place]  [address]      Labs: BNP (last 3 results) Recent Labs    03/16/20 0506 04/02/20 0303 04/22/20 0811  BNP 18.6 18.4 57.2   Basic Metabolic Panel: Recent Labs  Lab 04/21/20 0344 04/22/20 0443 04/23/20 0933  NA 139 140 139  K 4.5 4.6 4.2  CL 104 105 102  CO2 27 27 30   GLUCOSE 119* 157* 95  BUN 18 19 20   CREATININE 1.11 1.01 0.99  CALCIUM 9.2 9.7 9.4   Liver Function Tests: No results for input(s): AST, ALT, ALKPHOS, BILITOT, PROT, ALBUMIN in the last 168 hours. No results for input(s): LIPASE, AMYLASE in the last 168 hours. No results for input(s): AMMONIA in the last 168 hours. CBC: Recent Labs  Lab 04/21/20 0344 04/22/20 0443 04/23/20 0933  WBC 8.2 15.0* 18.2*  NEUTROABS 5.0  --   --   HGB 14.3 13.7 13.3  HCT 41.9 41.4 40.7  MCV 89.7 90.0 91.1  PLT 218 210 215   Cardiac Enzymes: No results for input(s): CKTOTAL, CKMB, CKMBINDEX, TROPONINI in the last 168 hours. BNP: Invalid input(s): POCBNP CBG: No results for input(s): GLUCAP in the last 168 hours. D-Dimer Recent Labs    04/21/20 2056  DDIMER 0.31   Hgb A1c No results for input(s): HGBA1C in the last 72 hours. Lipid Profile No results for input(s): CHOL, HDL, LDLCALC, TRIG, CHOLHDL, LDLDIRECT in the last 72 hours. Thyroid function studies No results for input(s): TSH, T4TOTAL, T3FREE, THYROIDAB in the last 72 hours.  Invalid input(s): FREET3 Anemia work up No results for input(s): VITAMINB12, FOLATE, FERRITIN, TIBC, IRON, RETICCTPCT in the last 72 hours. Urinalysis    Component Value Date/Time   COLORURINE YELLOW (A) 12/18/2019 0213   APPEARANCEUR CLEAR (A) 12/18/2019 0213   APPEARANCEUR Clear 09/04/2019 1429   LABSPEC 1.017 12/18/2019 0213   LABSPEC 1.014 11/09/2013 1525   PHURINE 6.0 12/18/2019 0213   GLUCOSEU NEGATIVE 12/18/2019 0213   GLUCOSEU Negative 11/09/2013 1525   HGBUR NEGATIVE 12/18/2019 0213   BILIRUBINUR NEGATIVE 12/18/2019 0213   BILIRUBINUR Negative 09/04/2019 1429    BILIRUBINUR Negative 11/09/2013 [place] 12/18/2019 0213   PROTEINUR NEGATIVE 12/18/2019 0213   NITRITE NEGATIVE 12/18/2019 0213   LEUKOCYTESUR NEGATIVE 12/18/2019 0213   LEUKOCYTESUR Negative 11/09/2013 1525   Sepsis Labs Invalid input(s): PROCALCITONIN,  WBC,  LACTICIDVEN Microbiology Recent Results (from the past 240 hour(s))  Resp Panel by RT-PCR (Flu A&B, Covid) Nasopharyngeal  Swab     Status: None   Collection Time: 04/21/20  3:43 AM   Specimen: Nasopharyngeal Swab; Nasopharyngeal(NP) swabs in vial transport medium  Result Value Ref Range Status   SARS Coronavirus 2 by RT PCR NEGATIVE NEGATIVE Final    Comment: (NOTE) SARS-CoV-2 target nucleic acids are NOT DETECTED.  The SARS-CoV-2 RNA is generally detectable in upper respiratory specimens during the acute phase of infection. The lowest concentration of SARS-CoV-2 viral copies this assay can detect is 138 copies/mL. A negative result does not preclude SARS-Cov-2 infection and should not be used as the sole basis for treatment or other patient management decisions. A negative result may occur with  improper specimen collection/handling, submission of specimen other than nasopharyngeal swab, presence of viral mutation(s) within the areas targeted by this assay, and inadequate number of viral copies(<138 copies/mL). A negative result must be combined with clinical observations, patient history, and epidemiological information. The expected result is Negative.  Fact Sheet for Patients:  EntrepreneurPulse.com.au  Fact Sheet for Healthcare Providers:  IncredibleEmployment.be  This test is no t yet approved or cleared by the [place] FDA and  has been authorized for detection and/or diagnosis of SARS-CoV-2 by FDA under an Emergency Use Authorization (EUA). This EUA will remain  in effect (meaning this test can be used) for the duration of the COVID-19 declaration under  Section 564(b)(1) of the Act, 21 U.S.C.section 360bbb-3(b)(1), unless the authorization is terminated  or revoked sooner.       Influenza A by PCR NEGATIVE NEGATIVE Final   Influenza B by PCR NEGATIVE NEGATIVE Final    Comment: (NOTE) The Xpert Xpress SARS-CoV-2/FLU/RSV plus assay is intended as an aid in the diagnosis of influenza from Nasopharyngeal swab specimens and should not be used as a sole basis for treatment. Nasal washings and aspirates are unacceptable for Xpert Xpress SARS-CoV-2/FLU/RSV testing.  Fact Sheet for Patients: EntrepreneurPulse.com.au  Fact Sheet for Healthcare Providers: IncredibleEmployment.be  This test is not yet approved or cleared by the [place] FDA and has been authorized for detection and/or diagnosis of SARS-CoV-2 by FDA under an Emergency Use Authorization (EUA). This EUA will remain in effect (meaning this test can be used) for the duration of the COVID-19 declaration under Section 564(b)(1) of the Act, 21 U.S.C. section 360bbb-3(b)(1), unless the authorization is terminated or revoked.  Performed at [facility], [address] [place], [place] [address]   Blood culture (routine x 2)     Status: None (Preliminary result)   Collection Time: 04/21/20  4:33 AM   Specimen: BLOOD  Result Value Ref Range Status   Specimen Description BLOOD LEFT ANTECUBITAL  Final   Special Requests   Final    BOTTLES DRAWN AEROBIC AND ANAEROBIC Blood Culture adequate volume   Culture   Final    NO GROWTH 2 DAYS Performed at [facility], [address], [place], [place] [address]    Report Status PENDING  Incomplete  Blood culture (routine x 2)     Status: None (Preliminary result)   Collection Time: 04/21/20  4:33 AM   Specimen: BLOOD  Result Value Ref Range Status   Specimen Description BLOOD BLOOD RIGHT FOREARM  Final   Special Requests   Final    BOTTLES DRAWN AEROBIC AND ANAEROBIC Blood  Culture adequate volume   Culture   Final    NO GROWTH 2 DAYS Performed at [facility], [address], [place],  [address]    Report  Status PENDING  Incomplete  Resp Panel by RT-PCR (Flu A&B, Covid) Nasopharyngeal Swab     Status: None   Collection Time: 04/23/20 11:24 AM   Specimen: Nasopharyngeal Swab; Nasopharyngeal(NP) swabs in vial transport medium  Result Value Ref Range Status   SARS Coronavirus 2 by RT PCR NEGATIVE NEGATIVE Final    Comment: (NOTE) SARS-CoV-2 target nucleic acids are NOT DETECTED.  The SARS-CoV-2 RNA is generally detectable in upper respiratory specimens during the acute phase of infection. The lowest concentration of SARS-CoV-2 viral copies this assay can detect is 138 copies/mL. A negative result does not preclude SARS-Cov-2 infection and should not be used as the sole basis for treatment or other patient management decisions. A negative result may occur with  improper specimen collection/handling, submission of specimen other than nasopharyngeal swab, presence of viral mutation(s) within the areas targeted by this assay, and inadequate number of viral copies(<138 copies/mL). A negative result must be combined with clinical observations, patient history, and epidemiological information. The expected result is Negative.  Fact Sheet for Patients:  EntrepreneurPulse.com.au  Fact Sheet for Healthcare Providers:  IncredibleEmployment.be  This test is no t yet approved or cleared by the [place] FDA and  has been authorized for detection and/or diagnosis of SARS-CoV-2 by FDA under an Emergency Use Authorization (EUA). This EUA will remain  in effect (meaning this test can be used) for the duration of the COVID-19 declaration under Section 564(b)(1) of the Act, 21 U.S.C.section 360bbb-3(b)(1), unless the authorization is terminated  or revoked sooner.       Influenza A by PCR NEGATIVE NEGATIVE  Final   Influenza B by PCR NEGATIVE NEGATIVE Final    Comment: (NOTE) The Xpert Xpress SARS-CoV-2/FLU/RSV plus assay is intended as an aid in the diagnosis of influenza from Nasopharyngeal swab specimens and should not be used as a sole basis for treatment. Nasal washings and aspirates are unacceptable for Xpert Xpress SARS-CoV-2/FLU/RSV testing.  Fact Sheet for Patients: EntrepreneurPulse.com.au  Fact Sheet for Healthcare Providers: IncredibleEmployment.be  This test is not yet approved or cleared by the [place] FDA and has been authorized for detection and/or diagnosis of SARS-CoV-2 by FDA under an Emergency Use Authorization (EUA). This EUA will remain in effect (meaning this test can be used) for the duration of the COVID-19 declaration under Section 564(b)(1) of the Act, 21 U.S.C. section 360bbb-3(b)(1), unless the authorization is terminated or revoked.  Performed at [facility], [address] [place], [place] [address]      Time coordinating discharge: Over 30 minutes  SIGNED:   Nolberto Hanlon, MD  [facility] 04/23/2020, 1:45 PM Pager   If 7PM-7AM, please contact night-coverage [email or web address] Password [email or web address]

## 2020-04-26 LAB — CULTURE, BLOOD (ROUTINE X 2)
Culture: NO GROWTH
Culture: NO GROWTH
Special Requests: ADEQUATE
Special Requests: ADEQUATE

## 2020-04-26 NOTE — Congregational Nurse Program (Signed)
  Dept: [place] Nurse Program Note  Date of Encounter: 04/26/2020 Client in for bi weekly blood pressure check. He a was recently hospitalized for respiratory failure discharged on 4/9. Client appeared to have increased dyspnea during this visit. Encouraged him to use his inhaler, he did not have it with him. Reports it is at the group home, encouraged cleint to use the inhaler as soon as he returns home, less dyspnea noted by endo of visit. Emotional support given.  Past Medical History: Past Medical History:  Diagnosis Date  . Chronic respiratory failure with hypoxia ([place])   . COPD (chronic obstructive pulmonary disease) ([place])   . Hypertension   . Schizophrenia [facility])     Encounter Details:  CNP Questionnaire - 04/26/20 1115      Questionnaire   Do you give verbal consent to treat you today? Yes    Visit Setting Church or Organization    Location Patient Served At [facility]    Patient Status Not Applicable   [facility] group home resident   Medical Provider Yes    Insurance Medicaid    Intervention Assess (including screenings)    Housing/Utilities --   lives in a group/care home   Interpersonal Safety --   patient did not report any safety concerns   Food --   pateint lives in a group/care home, food is provided   Medication --   medication is provided at [facility]   Referrals Other   n/a   Screening Referrals --   n/a   ED Visit Averted --   n/a   Life-Saving Intervention Made --   n/a

## 2020-04-28 NOTE — Congregational Nurse Program (Signed)
  Dept: [place] Nurse Program Note  Date of Encounter: 04/28/2020 Client in for biweekly blood pressure check. Less dyspnea noted today, he reports using his inhaler this morning. Will continue to return to clinic for BP checks and support.  Past Medical History: Past Medical History:  Diagnosis Date  . Chronic respiratory failure with hypoxia ([place])   . COPD (chronic obstructive pulmonary disease) ([place])   . Hypertension   . Schizophrenia [facility])     Encounter Details:  CNP Questionnaire - 04/28/20 1311      Questionnaire   Do you give verbal consent to treat you today? Yes    Visit Setting Church or Organization    Location Patient Served At [facility]    Patient Status Not Applicable   [facility] group home resident   Medical Provider Yes    Insurance Medicaid    Intervention Assess (including screenings)    Housing/Utilities --   lives in a group/care home   Interpersonal Safety --   patient did not report any safety concerns   Food --   pateint lives in a group/care home, food is provided   Medication --   medication is provided at [facility]   Referrals Other   n/a   Screening Referrals --   n/a   ED Visit Averted --   n/a   Life-Saving Intervention Made --   n/a

## 2020-04-30 ENCOUNTER — Emergency Department: Payer: Medicare Other

## 2020-04-30 ENCOUNTER — Emergency Department
Admission: EM | Admit: 2020-04-30 | Discharge: 2020-04-30 | Disposition: A | Discharge status: Home / Self Care | Payer: Medicare Other | Attending: Student in an Organized Health Care Education/Training Program | Admitting: Student in an Organized Health Care Education/Training Program

## 2020-04-30 ENCOUNTER — Other Ambulatory Visit: Payer: Self-pay

## 2020-04-30 DIAGNOSIS — I2510 Atherosclerotic heart disease of native coronary artery without angina pectoris: Secondary | ICD-10-CM | POA: Insufficient documentation

## 2020-04-30 DIAGNOSIS — J441 Chronic obstructive pulmonary disease with (acute) exacerbation: Secondary | ICD-10-CM | POA: Diagnosis not present

## 2020-04-30 DIAGNOSIS — Z79899 Other long term (current) drug therapy: Secondary | ICD-10-CM | POA: Diagnosis not present

## 2020-04-30 DIAGNOSIS — Z20822 Contact with and (suspected) exposure to covid-19: Secondary | ICD-10-CM | POA: Diagnosis not present

## 2020-04-30 DIAGNOSIS — R1013 Epigastric pain: Secondary | ICD-10-CM | POA: Insufficient documentation

## 2020-04-30 DIAGNOSIS — R0600 Dyspnea, unspecified: Secondary | ICD-10-CM

## 2020-04-30 DIAGNOSIS — I10 Essential (primary) hypertension: Secondary | ICD-10-CM | POA: Diagnosis not present

## 2020-04-30 DIAGNOSIS — R0602 Shortness of breath: Secondary | ICD-10-CM | POA: Diagnosis present

## 2020-04-30 DIAGNOSIS — F17210 Nicotine dependence, cigarettes, uncomplicated: Secondary | ICD-10-CM | POA: Diagnosis not present

## 2020-04-30 DIAGNOSIS — J449 Chronic obstructive pulmonary disease, unspecified: Secondary | ICD-10-CM

## 2020-04-30 LAB — CBC
HCT: 43.8 % (ref 39.0–52.0)
Hemoglobin: 14.5 g/dL (ref 13.0–17.0)
MCH: 30.2 pg (ref 26.0–34.0)
MCHC: 33.1 g/dL (ref 30.0–36.0)
MCV: 91.3 fL (ref 80.0–100.0)
Platelets: 198 10*3/uL (ref 150–400)
RBC: 4.8 MIL/uL (ref 4.22–5.81)
RDW: 13.6 % (ref 11.5–15.5)
WBC: 9.7 10*3/uL (ref 4.0–10.5)
nRBC: 0 % (ref 0.0–0.2)

## 2020-04-30 LAB — COMPREHENSIVE METABOLIC PANEL
ALT: 163 U/L — ABNORMAL HIGH (ref 0–44)
AST: 57 U/L — ABNORMAL HIGH (ref 15–41)
Albumin: 3.7 g/dL (ref 3.5–5.0)
Alkaline Phosphatase: 71 U/L (ref 38–126)
Anion gap: 7 (ref 5–15)
BUN: 15 mg/dL (ref 8–23)
CO2: 29 mmol/L (ref 22–32)
Calcium: 9.5 mg/dL (ref 8.9–10.3)
Chloride: 104 mmol/L (ref 98–111)
Creatinine, Ser: 1.03 mg/dL (ref 0.61–1.24)
GFR, Estimated: >60 mL/min (ref >60)
Glucose, Bld: 88 mg/dL (ref 70–99)
Potassium: 4.3 mmol/L (ref 3.5–5.1)
Sodium: 140 mmol/L (ref 135–145)
Total Bilirubin: 0.9 mg/dL (ref 0.3–1.2)
Total Protein: 6.3 g/dL — ABNORMAL LOW (ref 6.5–8.1)

## 2020-04-30 LAB — RESP PANEL BY RT-PCR (FLU A&B, COVID) ARPGX2
Influenza A by PCR: NEGATIVE
Influenza B by PCR: NEGATIVE
SARS Coronavirus 2 by RT PCR: NEGATIVE

## 2020-04-30 LAB — TROPONIN I (HIGH SENSITIVITY)
Troponin I (High Sensitivity): 13 ng/L (ref <18)
Troponin I (High Sensitivity): 10 ng/L (ref <18)

## 2020-04-30 LAB — LIPASE, BLOOD: Lipase: 34 U/L (ref 11–51)

## 2020-04-30 MED ORDER — IPRATROPIUM-ALBUTEROL 0.5-2.5 (3) MG/3ML IN SOLN
3.0000 mL | Freq: Once | RESPIRATORY_TRACT | Status: AC
  Administered 2020-04-30: 3 mL via RESPIRATORY_TRACT
  Filled 2020-04-30: qty 3

## 2020-04-30 MED ORDER — METHYLPREDNISOLONE SODIUM SUCC 125 MG IJ SOLR: 125.0000 mg | Freq: Once | INTRAMUSCULAR | Status: DC

## 2020-04-30 NOTE — ED Provider Notes (Signed)
[facility] Provider Note    Event Date/Time   First MD Initiated Contact with Patient 04/30/20 [phone]     (approximate)  I have reviewed the triage vital signs and the nursing notes.   HISTORY  Chief Complaint Abdominal Pain and Shortness of Breath    HPI Carlos [facility] is a 70 y.o. male with the below listed past medical history presents to the ER for evaluation of shortness of breath as well as feeling of epigastric discomfort and feeling like his belly started bloating after he ate dinner last night.  Patient states he was feeling very short of breath but felt much better after EMS gave him a nebulizer treatment.    Does not wear home oxygen.  No recent antibiotics.  Denies any chest pain or pressure.   Denies any lower abdominal pain.  Had one episode of loose stool no blood no melena no hematochezia.   Past Medical History:  Diagnosis Date  . Chronic respiratory failure with hypoxia ([place])   . COPD (chronic obstructive pulmonary disease) ([place])   . Hypertension   . Schizophrenia ([place])    Family History  Problem Relation Age of Onset  . Prostate cancer Neg Hx   . Bladder Cancer Neg Hx    Past Surgical History:  Procedure Laterality Date  . COLONOSCOPY    . COLONOSCOPY WITH PROPOFOL N/A 12/13/2016   Procedure: COLONOSCOPY WITH PROPOFOL;  Surgeon: Lollie Sails, MD;  Location: [facility] ENDOSCOPY;  Service: Endoscopy;  Laterality: N/A;  . ESOPHAGOGASTRODUODENOSCOPY (EGD) WITH PROPOFOL N/A 12/13/2016   Procedure: ESOPHAGOGASTRODUODENOSCOPY (EGD) WITH PROPOFOL;  Surgeon: Lollie Sails, MD;  Location: [facility] ENDOSCOPY;  Service: Endoscopy;  Laterality: N/A;  . left arm surgery     Patient Active Problem List   Diagnosis Date Noted  . Protein-calorie malnutrition, severe 03/18/2020  . COPD with acute exacerbation ([place]) 03/10/2020  . Right-sided chest pain 03/10/2020  . HLD (hyperlipidemia) 01/18/2020  . CAD (coronary  artery disease) 01/18/2020  . Hypertensive urgency 01/18/2020  . Tobacco abuse 01/18/2020  . Scrotum pain   . Transaminitis   . Demand ischemia ([place]) 06/11/2019  . Malnutrition of moderate degree 06/10/2019  . NSTEMI (non-ST elevated myocardial infarction) ([place]) 06/09/2019  . Depression with anxiety 06/09/2019  . Pleuritic chest pain 05/20/2019  . CAP (community acquired pneumonia) 05/20/2019  . Hypertension   . COPD exacerbation ([place])   . Acute respiratory failure ([place])   . Altered mental status 09/25/2018  . Schizophrenia ([place])   . Tachycardia 08/21/2018  . Schizophrenia, chronic with acute exacerbation ([place]) 08/17/2018  . Elevated PSA 05/24/2015      Prior to Admission medications   Medication Sig Start Date End Date Taking? Authorizing Provider  albuterol (VENTOLIN HFA) 108 (90 Base) MCG/ACT inhaler Inhale 2 by mouth every 4 hours as needed for wheezing, cough, and/or shortness of breath 04/23/20   Nolberto Hanlon, MD  amlodipine-olmesartan (AZOR) 10-20 MG tablet Take 1 tablet by mouth daily. 03/23/20   Loel Dubonnet, NP  ARIPiprazole (ABILIFY) 30 MG tablet Take 30 mg by mouth daily. 01/29/20   [provider]  docusate sodium (COLACE) 100 MG capsule Take 1 capsule (100 mg total) by mouth 2 (two) times daily. 04/12/20 05/12/20  Harvest Dark, MD  donepezil (ARICEPT) 5 MG tablet Take 5 mg by mouth at bedtime.    [provider]  hydrOXYzine (ATARAX/VISTARIL) 50 MG tablet Take 50 mg by mouth every 6 (six) hours as  needed for anxiety or itching. Patient not taking: Reported on 04/21/2020    [provider]  ipratropium-albuterol (DUONEB) 0.5-2.5 (3) MG/3ML SOLN Take 3 mLs by nebulization every 4 (four) hours for 14 days. 03/11/20 03/25/20  Fritzi Mandes, MD  pantoprazole (PROTONIX) 20 MG tablet Take 1 tablet (20 mg total) by mouth daily. 12/18/19 12/17/20  Lavonia Drafts, MD  polyethylene glycol powder (GLYCOLAX/MIRALAX) 17 GM/SCOOP powder Take 17 g by mouth daily  as needed for moderate constipation. 04/12/20   Harvest Dark, MD  predniSONE (DELTASONE) 20 MG tablet Take 1 tablet (20 mg total) by mouth daily. 04/24/20   Nolberto Hanlon, MD  QUEtiapine (SEROQUEL) 100 MG tablet Take 100 mg by mouth daily. Patient not taking: Reported on 04/21/2020    [provider]  QUEtiapine (SEROQUEL) 200 MG tablet Take 3 tablets (600 mg total) by mouth at bedtime. 02/03/20   Harvest Dark, MD  QUEtiapine (SEROQUEL) 25 MG tablet Take 25 mg by mouth daily. 02/25/20   [provider]  traZODone (DESYREL) 100 MG tablet Take 100 mg by mouth at bedtime. 02/24/20   [provider]  vitamin B-12 (CYANOCOBALAMIN) 1000 MCG tablet Take 1,000 mcg by mouth daily.    [provider]    Allergies Ace inhibitors    Social History Social History   Tobacco Use  . Smoking status: Current Every Day Smoker    Packs/day: 1.00    Types: Cigarettes  . Smokeless tobacco: Never Used  Substance Use Topics  . Alcohol use: Yes    Alcohol/week: 0.0 standard drinks    Comment: beer  . Drug use: Not Currently    Types: Marijuana    Review of Systems Patient denies headaches, rhinorrhea, blurry vision, numbness, shortness of breath, chest pain, edema, cough, abdominal pain, nausea, vomiting, diarrhea, dysuria, fevers, rashes or hallucinations unless otherwise stated above in HPI. ____________________________________________   PHYSICAL EXAM:  VITAL SIGNS: Vitals:   04/30/20 0643 04/30/20 0644  BP:    Pulse: 97 91  Resp: 14 15  Temp:    SpO2: 96% 100%    Constitutional: Alert and oriented.  Eyes: Conjunctivae are normal.  Head: Atraumatic. Nose: No congestion/rhinnorhea. Mouth/Throat: Mucous membranes are moist.   Neck: No stridor. Painless ROM.  Cardiovascular: Normal rate, regular rhythm. Grossly normal heart sounds.  Good peripheral circulation. Respiratory: tachypnea with prolonged exp phase, no crackles Gastrointestinal: Soft and  nontender. No distention. No abdominal bruits. No CVA tenderness. Genitourinary:  Musculoskeletal: No lower extremity tenderness 1+ BLE edema.  No joint effusions. Neurologic:  Normal speech and language. No gross focal neurologic deficits are appreciated. No facial droop Skin:  Skin is warm, dry and intact. No rash noted. Psychiatric: Mood and affect are normal. Speech and behavior are normal.  ____________________________________________   LABS (all labs ordered are listed, but only abnormal results are displayed)  Results for orders placed or performed during the hospital encounter of 04/30/20 (from the past 24 hour(s))  Comprehensive metabolic panel     Status: Abnormal   Collection Time: 04/30/20  4:17 AM  Result Value Ref Range   Sodium 140 135 - 145 mmol/L   Potassium 4.3 3.5 - 5.1 mmol/L   Chloride 104 98 - 111 mmol/L   CO2 29 22 - 32 mmol/L   Glucose, Bld 88 70 - 99 mg/dL   BUN 15 8 - 23 mg/dL   Creatinine, Ser 1.03 0.61 - 1.24 mg/dL   Calcium 9.5 8.9 - 10.3 mg/dL   Total Protein  6.3 (L) 6.5 - 8.1 g/dL   Albumin 3.7 3.5 - 5.0 g/dL   AST 57 (H) 15 - 41 U/L   ALT 163 (H) 0 - 44 U/L   Alkaline Phosphatase 71 38 - 126 U/L   Total Bilirubin 0.9 0.3 - 1.2 mg/dL   GFR, Estimated >60 >60 mL/min   Anion gap 7 5 - 15  CBC     Status: None   Collection Time: 04/30/20  4:17 AM  Result Value Ref Range   WBC 9.7 4.0 - 10.5 K/uL   RBC 4.80 4.22 - 5.81 MIL/uL   Hemoglobin 14.5 13.0 - 17.0 g/dL   HCT 43.8 39.0 - 52.0 %   MCV 91.3 80.0 - 100.0 fL   MCH 30.2 26.0 - 34.0 pg   MCHC 33.1 30.0 - 36.0 g/dL   RDW 13.6 11.5 - 15.5 %   Platelets 198 150 - 400 K/uL   nRBC 0.0 0.0 - 0.2 %  Lipase, blood     Status: None   Collection Time: 04/30/20  4:17 AM  Result Value Ref Range   Lipase 34 11 - 51 U/L  Troponin I (High Sensitivity)     Status: None   Collection Time: 04/30/20  4:17 AM  Result Value Ref Range   Troponin I (High Sensitivity) 13 <18 ng/L  Resp Panel by RT-PCR (Flu  A&B, Covid) Nasopharyngeal Swab     Status: None   Collection Time: 04/30/20  5:47 AM   Specimen: Nasopharyngeal Swab; Nasopharyngeal(NP) swabs in vial transport medium  Result Value Ref Range   SARS Coronavirus 2 by RT PCR NEGATIVE NEGATIVE   Influenza A by PCR NEGATIVE NEGATIVE   Influenza B by PCR NEGATIVE NEGATIVE  Troponin I (High Sensitivity)     Status: None   Collection Time: 04/30/20  5:47 AM  Result Value Ref Range   Troponin I (High Sensitivity) 10 <18 ng/L   ____________________________________________  EKG My review and personal interpretation at Time: 3:42   Indication: sob  Rate: 100  Rhythm: sinus Axis: normal Other: nonspecific st and t wave abn ____________________________________________  RADIOLOGY  I personally reviewed all radiographic images ordered to evaluate for the above acute complaints and reviewed radiology reports and findings.  These findings were personally discussed with the patient.  Please see medical record for radiology report.  ____________________________________________   PROCEDURES  Procedure(s) performed:  Procedures    Critical Care performed: no ____________________________________________   INITIAL IMPRESSION / ASSESSMENT AND PLAN / ED COURSE  Pertinent labs & imaging results that were available during my care of the patient were reviewed by me and considered in my medical decision making (see chart for details).   DDX: Asthma, copd, CHF, pna, ptx, malignancy, Pe, anemia   Carlos [facility] is a 70 y.o. who presents to the ED with symptoms of shortness of breath as well as some epigastric discomfort.  His abdominal exam is soft and benign.  Does have some wheezing on exam but is not hypoxic.  Blood work and imaging will be ordered for the but differential.  Have a lower suspicion for SBO appendicitis, diverticulitis or other acute intra-abdominal process.  EKG nonischemic.  Troponin negative.  Clinical Course as of 04/30/20  0654  Sat Apr 30, 2020  0541 Patient reassessed.  Patient feeling improved.  Wheezing notably improved after nebulizer.  Work-up thus far is reassuring.  Given his psychiatric history a little hesitant to start any steroids and patient states that he has  had issues with steroids in the past.  Will ambulate to see if he is hypoxic.  Patient states he has plenty of nebulizers at home and otherwise feels okay. [PR]  [phone] Patient feeling well.  Is of most of his symptoms were related to some mild shortness of breath.  Feels like his breathing is back at his baseline.  He is not hypoxic with ambulation.  Repeat abdominal exam soft and benign.  Given no white count with reassuring exam do not feel that repeat CT imaging is clinically indicated with this presentation.  He has nebulizers at home.  I am hesitant to prescribe steroids at this time given his lack of hypoxia and his symptoms are otherwise pretty mild.  States he feels well enough to go back feels at his baseline which I think is reasonable at this point given his reassuring work-up. [PR]    Clinical Course User Index [PR] Merlyn Lot, MD    The patient was evaluated in Emergency Department today for the symptoms described in the history of present illness. He/she was evaluated in the context of the global COVID-19 pandemic, which necessitated consideration that the patient might be at risk for infection with the SARS-CoV-2 virus that causes COVID-19. Institutional protocols and algorithms that pertain to the evaluation of patients at risk for COVID-19 are in a state of rapid change based on information released by regulatory bodies including the CDC and federal and state organizations. These policies and algorithms were followed during the patient's care in the ED.  As part of my medical decision making, I reviewed the following data within the [place] notes reviewed and incorporated, Labs reviewed, notes from prior  ED visits and [place] Controlled Substance Database   ____________________________________________   FINAL CLINICAL IMPRESSION(S) / ED DIAGNOSES  Final diagnoses:  Dyspnea, unspecified type  Chronic obstructive pulmonary disease, unspecified COPD type ([place])      NEW MEDICATIONS STARTED DURING THIS VISIT:  New Prescriptions   No medications on file     Note:  This document was prepared using Dragon voice recognition software and may include unintentional dictation errors.    Merlyn Lot, MD 04/30/20 [phone]

## 2020-04-30 NOTE — ED Notes (Signed)
Attempted to call Angelena Sole, Luana Shu, and Facility with no answer. Left voicemail. Will continue to call.

## 2020-04-30 NOTE — ED Notes (Signed)
[facility] discharged [facility]. NH personnel here to pick patient up. Patient assisted into w/c and endorsed to nursing home personnel.

## 2020-04-30 NOTE — ED Triage Notes (Signed)
Pt complains of sharp abd pain generalized and shob. Per ems pt was noted to have abd breathing and tachypnea with shob on exertion. Ems gave 2. 5mg  albuterol neb. Pt in no resp distress currently.

## 2020-04-30 NOTE — ED Notes (Signed)
Pt maintained O2 sats above 92% while ambulating.

## 2020-05-03 NOTE — Congregational Nurse Program (Signed)
  Dept: [place] Nurse Program Note  Date of Encounter: 05/03/2020 Client in for biweekly blood pressure check, some dyspnea noted. VS: 184/92, 107 oxygen saturations 97% on room air. He reports he used a breathing treatment this morning. Client also reports he had been back to the hospital. Chart notes reviewed. Client was unclear as to findings, but mentioned pneumonia. He was also uncertain as to when his next scheduled Md appointment was. RN to continue to provide support, client plans to return to clinic on Thursday.   Past Medical History: Past Medical History:  Diagnosis Date  . Chronic respiratory failure with hypoxia ([place])   . COPD (chronic obstructive pulmonary disease) ([place])   . Hypertension   . Schizophrenia [facility])     Encounter Details:  CNP Questionnaire - 05/03/20 1124      Questionnaire   Do you give verbal consent to treat you today? Yes    Visit Setting Church or Organization    Location Patient Served At [facility]    Patient Status Not Applicable   [facility] group home resident   Medical Provider Yes    Insurance Medicaid    Intervention Assess (including screenings)    Housing/Utilities --   lives in a group/care home   Interpersonal Safety --   patient did not report any safety concerns   Food --   pateint lives in a group/care home, food is provided   Medication --   medication is provided at [facility]   Referrals Other   n/a   Screening Referrals --   n/a   ED Visit Averted --   n/a   Life-Saving Intervention Made --   n/a

## 2020-05-09 ENCOUNTER — Observation Stay
Admission: EM | Admit: 2020-05-09 | Discharge: 2020-05-10 | Disposition: A | Discharge status: Home / Self Care | Payer: Medicare Other | Attending: Internal Medicine | Admitting: Internal Medicine

## 2020-05-09 ENCOUNTER — Other Ambulatory Visit: Payer: Self-pay

## 2020-05-09 ENCOUNTER — Emergency Department: Payer: Medicare Other

## 2020-05-09 DIAGNOSIS — F17210 Nicotine dependence, cigarettes, uncomplicated: Secondary | ICD-10-CM | POA: Diagnosis not present

## 2020-05-09 DIAGNOSIS — Z20822 Contact with and (suspected) exposure to covid-19: Secondary | ICD-10-CM | POA: Diagnosis not present

## 2020-05-09 DIAGNOSIS — R0602 Shortness of breath: Secondary | ICD-10-CM | POA: Diagnosis present

## 2020-05-09 DIAGNOSIS — J441 Chronic obstructive pulmonary disease with (acute) exacerbation: Secondary | ICD-10-CM | POA: Diagnosis not present

## 2020-05-09 DIAGNOSIS — I10 Essential (primary) hypertension: Secondary | ICD-10-CM | POA: Insufficient documentation

## 2020-05-09 DIAGNOSIS — J9621 Acute and chronic respiratory failure with hypoxia: Secondary | ICD-10-CM

## 2020-05-09 DIAGNOSIS — J9601 Acute respiratory failure with hypoxia: Secondary | ICD-10-CM | POA: Diagnosis not present

## 2020-05-09 DIAGNOSIS — Z79899 Other long term (current) drug therapy: Secondary | ICD-10-CM | POA: Insufficient documentation

## 2020-05-09 DIAGNOSIS — I2510 Atherosclerotic heart disease of native coronary artery without angina pectoris: Secondary | ICD-10-CM | POA: Diagnosis not present

## 2020-05-09 DIAGNOSIS — F209 Schizophrenia, unspecified: Secondary | ICD-10-CM | POA: Diagnosis present

## 2020-05-09 LAB — URINALYSIS, ROUTINE W REFLEX MICROSCOPIC
Bilirubin Urine: NEGATIVE
Glucose, UA: NEGATIVE mg/dL
Hgb urine dipstick: NEGATIVE
Ketones, ur: NEGATIVE mg/dL
Leukocytes,Ua: NEGATIVE
Nitrite: NEGATIVE
Protein, ur: NEGATIVE mg/dL
Specific Gravity, Urine: 1.016 (ref 1.005–1.030)
pH: 5 (ref 5.0–8.0)

## 2020-05-09 LAB — BLOOD GAS, ARTERIAL
Acid-Base Excess: 1.1 mmol/L (ref 0.0–2.0)
Bicarbonate: 27.6 mmol/L (ref 20.0–28.0)
O2 Saturation: 99.6 %
Patient temperature: 37
pCO2 arterial: 50 mmHg — ABNORMAL HIGH (ref 32.0–48.0)
pH, Arterial: 7.35 (ref 7.350–7.450)
pO2, Arterial: 188 mmHg — ABNORMAL HIGH (ref 83.0–108.0)

## 2020-05-09 LAB — LIPASE, BLOOD: Lipase: 34 U/L (ref 11–51)

## 2020-05-09 LAB — COMPREHENSIVE METABOLIC PANEL
ALT: 35 U/L (ref 0–44)
AST: 25 U/L (ref 15–41)
Albumin: 3.8 g/dL (ref 3.5–5.0)
Alkaline Phosphatase: 84 U/L (ref 38–126)
Anion gap: 9 (ref 5–15)
BUN: 18 mg/dL (ref 8–23)
CO2: 26 mmol/L (ref 22–32)
Calcium: 9.3 mg/dL (ref 8.9–10.3)
Chloride: 103 mmol/L (ref 98–111)
Creatinine, Ser: 0.96 mg/dL (ref 0.61–1.24)
GFR, Estimated: >60 mL/min (ref >60)
Glucose, Bld: 145 mg/dL — ABNORMAL HIGH (ref 70–99)
Potassium: 3.6 mmol/L (ref 3.5–5.1)
Sodium: 138 mmol/L (ref 135–145)
Total Bilirubin: 0.6 mg/dL (ref 0.3–1.2)
Total Protein: 6.9 g/dL (ref 6.5–8.1)

## 2020-05-09 LAB — CBC WITH DIFFERENTIAL/PLATELET
Abs Immature Granulocytes: 0.04 10*3/uL (ref 0.00–0.07)
Basophils Absolute: 0 10*3/uL (ref 0.0–0.1)
Basophils Relative: 0 %
Eosinophils Absolute: 0.1 10*3/uL (ref 0.0–0.5)
Eosinophils Relative: 1 %
HCT: 42.9 % (ref 39.0–52.0)
Hemoglobin: 14.4 g/dL (ref 13.0–17.0)
Immature Granulocytes: 1 %
Lymphocytes Relative: 18 %
Lymphs Abs: 1.6 10*3/uL (ref 0.7–4.0)
MCH: 30.1 pg (ref 26.0–34.0)
MCHC: 33.6 g/dL (ref 30.0–36.0)
MCV: 89.6 fL (ref 80.0–100.0)
Monocytes Absolute: 0.7 10*3/uL (ref 0.1–1.0)
Monocytes Relative: 7 %
Neutro Abs: 6.5 10*3/uL (ref 1.7–7.7)
Neutrophils Relative %: 73 %
Platelets: 201 10*3/uL (ref 150–400)
RBC: 4.79 MIL/uL (ref 4.22–5.81)
RDW: 13.2 % (ref 11.5–15.5)
WBC: 8.9 10*3/uL (ref 4.0–10.5)
nRBC: 0 % (ref 0.0–0.2)

## 2020-05-09 LAB — RESP PANEL BY RT-PCR (FLU A&B, COVID) ARPGX2
Influenza A by PCR: NEGATIVE
Influenza B by PCR: NEGATIVE
SARS Coronavirus 2 by RT PCR: NEGATIVE

## 2020-05-09 LAB — TROPONIN I (HIGH SENSITIVITY)
Troponin I (High Sensitivity): 13 ng/L (ref <18)
Troponin I (High Sensitivity): 10 ng/L (ref <18)

## 2020-05-09 LAB — BRAIN NATRIURETIC PEPTIDE: B Natriuretic Peptide: 25.4 pg/mL (ref 0.0–100.0)

## 2020-05-09 LAB — HIV ANTIBODY (ROUTINE TESTING W REFLEX): HIV Screen 4th Generation wRfx: NONREACTIVE

## 2020-05-09 MED ORDER — ALBUTEROL SULFATE (2.5 MG/3ML) 0.083% IN NEBU
2.5000 mg | INHALATION_SOLUTION | RESPIRATORY_TRACT | Status: DC | PRN
Start: 2020-05-09 — End: 2020-05-10
  Administered 2020-05-09: 2.5 mg via RESPIRATORY_TRACT
  Filled 2020-05-09 (×2): qty 3

## 2020-05-09 MED ORDER — PREDNISONE 20 MG PO TABS: 40.0000 mg | ORAL_TABLET | Freq: Every day | ORAL | Status: DC

## 2020-05-09 MED ORDER — DM-GUAIFENESIN ER 30-600 MG PO TB12
1.0000 | ORAL_TABLET | Freq: Two times a day (BID) | ORAL | Status: DC
  Filled 2020-05-09 (×2): qty 1

## 2020-05-09 MED ORDER — PREDNISONE 20 MG PO TABS
40.0000 mg | ORAL_TABLET | Freq: Every day | ORAL | Status: DC
  Administered 2020-05-10: 40 mg via ORAL
  Filled 2020-05-09: qty 2

## 2020-05-09 MED ORDER — QUETIAPINE FUMARATE 25 MG PO TABS
25.0000 mg | ORAL_TABLET | Freq: Every day | ORAL | Status: DC
  Administered 2020-05-09 – 2020-05-10 (×2): 25 mg via ORAL
  Filled 2020-05-09 (×2): qty 1

## 2020-05-09 MED ORDER — GUAIFENESIN 100 MG/5ML PO SOLN
10.0000 mL | ORAL | Status: DC | PRN
  Administered 2020-05-09 – 2020-05-10 (×2): 200 mg via ORAL
  Filled 2020-05-09 (×3): qty 10

## 2020-05-09 MED ORDER — MAGNESIUM SULFATE 2 GM/50ML IV SOLN
2.0000 g | Freq: Once | INTRAVENOUS | Status: AC
  Administered 2020-05-09: 2 g via INTRAVENOUS
  Filled 2020-05-09: qty 50

## 2020-05-09 MED ORDER — IPRATROPIUM BROMIDE 0.02 % IN SOLN
0.5000 mg | RESPIRATORY_TRACT | Status: AC
  Administered 2020-05-09 (×3): 0.5 mg via RESPIRATORY_TRACT
  Filled 2020-05-09 (×2): qty 2.5

## 2020-05-09 MED ORDER — DONEPEZIL HCL 5 MG PO TABS
5.0000 mg | ORAL_TABLET | Freq: Every day | ORAL | Status: DC
  Administered 2020-05-09: 5 mg via ORAL
  Filled 2020-05-09: qty 1

## 2020-05-09 MED ORDER — TRAZODONE HCL 100 MG PO TABS
100.0000 mg | ORAL_TABLET | Freq: Every day | ORAL | Status: DC
  Filled 2020-05-09: qty 1

## 2020-05-09 MED ORDER — ENOXAPARIN SODIUM 40 MG/0.4ML ~~LOC~~ SOLN
40.0000 mg | SUBCUTANEOUS | Status: DC
  Administered 2020-05-09 – 2020-05-10 (×2): 40 mg via SUBCUTANEOUS
  Filled 2020-05-09 (×2): qty 0.4

## 2020-05-09 MED ORDER — METHYLPREDNISOLONE SODIUM SUCC 125 MG IJ SOLR: 60.0000 mg | Freq: Four times a day (QID) | INTRAMUSCULAR | Status: DC

## 2020-05-09 MED ORDER — METHYLPREDNISOLONE SODIUM SUCC 125 MG IJ SOLR
60.0000 mg | Freq: Four times a day (QID) | INTRAMUSCULAR | Status: AC
  Administered 2020-05-09 (×4): 60 mg via INTRAVENOUS
  Filled 2020-05-09 (×4): qty 2

## 2020-05-09 MED ORDER — ARIPIPRAZOLE 15 MG PO TABS
30.0000 mg | ORAL_TABLET | Freq: Every day | ORAL | Status: DC
  Administered 2020-05-09 – 2020-05-10 (×2): 30 mg via ORAL
  Filled 2020-05-09 (×2): qty 2

## 2020-05-09 MED ORDER — QUETIAPINE FUMARATE 300 MG PO TABS
600.0000 mg | ORAL_TABLET | Freq: Every day | ORAL | Status: DC
  Administered 2020-05-09: 600 mg via ORAL
  Filled 2020-05-09 (×2): qty 2

## 2020-05-09 MED ORDER — ALBUTEROL SULFATE (2.5 MG/3ML) 0.083% IN NEBU
INHALATION_SOLUTION | RESPIRATORY_TRACT | Status: AC
  Filled 2020-05-09: qty 12

## 2020-05-09 MED ORDER — ALBUTEROL SULFATE (2.5 MG/3ML) 0.083% IN NEBU
5.0000 mg | INHALATION_SOLUTION | Freq: Once | RESPIRATORY_TRACT | Status: AC
  Administered 2020-05-09: 5 mg via RESPIRATORY_TRACT
  Filled 2020-05-09: qty 6

## 2020-05-09 MED ORDER — IPRATROPIUM-ALBUTEROL 0.5-2.5 (3) MG/3ML IN SOLN
3.0000 mL | Freq: Four times a day (QID) | RESPIRATORY_TRACT | Status: DC
  Administered 2020-05-09 – 2020-05-10 (×5): 3 mL via RESPIRATORY_TRACT
  Filled 2020-05-09 (×5): qty 3

## 2020-05-09 NOTE — ED Notes (Signed)
RN aware of bed assigned 

## 2020-05-09 NOTE — H&P (Signed)
History and Physical    Carlos Nelson UYQ:[id number] DOB: [birth year] DOA: 05/09/2020  PCP: [place]   Patient coming from: Home  I have personally briefly reviewed patient's old medical records in [place]  Chief Complaint: Shortness of breath  HPI: Carlos Nelson is a 70 y.o. male with medical history significant for COPD, HTN and schizophrenia, hospitalized just 2 weeks prior with COPD exacerbation, pneumonia and hypoxia, who presents with a 1 day history of shortness of breath similar to previous COPD exacerbations with EMS reporting O2 sat of 89% on room air improving to the mid 90s on 3 L.  He was administered duo nebs in route as well as IV Solu-Medrol but on arrival continued to have increased work of breathing as well as anterior chest pain, abdominal worse with breathing.  Denies fever or chills, nausea or vomiting. ED Course: On arrival, BP 174/118, pulse 118, respirations 34 with O2 sats 96% on 3 L.  ABG with pH 7.35, PCO2 50 PO2 188.  Other labs unremarkable, troponin was 13/10, BNP 25 EKG as reviewed by me : Sinus tachycardia at 116 with no acute ST-T wave changes Imaging: Chest x-ray COPD without acute airspace disease  Patient was treated with several DuoNeb treatments, IV Solu-Medrol, IV magnesium but continued to have increased work of breathing and continued to require O2 to keep sats above 92.  Hospitalist consulted for admission.  Review of Systems: As per HPI otherwise all other systems on review of systems negative.    Past Medical History:  Diagnosis Date  . Chronic respiratory failure with hypoxia ([place])   . COPD (chronic obstructive pulmonary disease) ([place])   . Hypertension   . Schizophrenia [facility])     Past Surgical History:  Procedure Laterality Date  . COLONOSCOPY    . COLONOSCOPY WITH PROPOFOL N/A 12/13/2016   Procedure: COLONOSCOPY WITH PROPOFOL;  Surgeon: Lollie Sails, MD;  Location: [facility] ENDOSCOPY;  Service:  Endoscopy;  Laterality: N/A;  . ESOPHAGOGASTRODUODENOSCOPY (EGD) WITH PROPOFOL N/A 12/13/2016   Procedure: ESOPHAGOGASTRODUODENOSCOPY (EGD) WITH PROPOFOL;  Surgeon: Lollie Sails, MD;  Location: [facility] ENDOSCOPY;  Service: Endoscopy;  Laterality: N/A;  . left arm surgery       reports that he has been smoking cigarettes. He has been smoking about 1.00 pack per day. He has never used smokeless tobacco. He reports current alcohol use. He reports previous drug use. Drug: Marijuana.  Allergies  Allergen Reactions  . Ace Inhibitors Swelling    Ok with benazapril, per grouphome    Family History  Problem Relation Age of Onset  . Prostate cancer Neg Hx   . Bladder Cancer Neg Hx       Prior to Admission medications   Medication Sig Start Date End Date Taking? Authorizing Provider  albuterol (VENTOLIN HFA) 108 (90 Base) MCG/ACT inhaler Inhale 2 by mouth every 4 hours as needed for wheezing, cough, and/or shortness of breath 04/23/20   Nolberto Hanlon, MD  amlodipine-olmesartan (AZOR) 10-20 MG tablet Take 1 tablet by mouth daily. 03/23/20   Loel Dubonnet, NP  ARIPiprazole (ABILIFY) 30 MG tablet Take 30 mg by mouth daily. 01/29/20   [provider]  docusate sodium (COLACE) 100 MG capsule Take 1 capsule (100 mg total) by mouth 2 (two) times daily. 04/12/20 05/12/20  Harvest Dark, MD  donepezil (ARICEPT) 5 MG tablet Take 5 mg by mouth at bedtime.    [provider]  hydrOXYzine (ATARAX/VISTARIL) 50 MG tablet Take 50  mg by mouth every 6 (six) hours as needed for anxiety or itching. Patient not taking: Reported on 04/21/2020    [provider]  ipratropium-albuterol (DUONEB) 0.5-2.5 (3) MG/3ML SOLN Take 3 mLs by nebulization every 4 (four) hours for 14 days. 03/11/20 03/25/20  Fritzi Mandes, MD  pantoprazole (PROTONIX) 20 MG tablet Take 1 tablet (20 mg total) by mouth daily. 12/18/19 12/17/20  Lavonia Drafts, MD  polyethylene glycol powder (GLYCOLAX/MIRALAX) 17 GM/SCOOP powder  Take 17 g by mouth daily as needed for moderate constipation. 04/12/20   Harvest Dark, MD  predniSONE (DELTASONE) 20 MG tablet Take 1 tablet (20 mg total) by mouth daily. 04/24/20   Nolberto Hanlon, MD  QUEtiapine (SEROQUEL) 100 MG tablet Take 100 mg by mouth daily. Patient not taking: Reported on 04/21/2020    [provider]  QUEtiapine (SEROQUEL) 200 MG tablet Take 3 tablets (600 mg total) by mouth at bedtime. 02/03/20   Harvest Dark, MD  QUEtiapine (SEROQUEL) 25 MG tablet Take 25 mg by mouth daily. 02/25/20   [provider]  traZODone (DESYREL) 100 MG tablet Take 100 mg by mouth at bedtime. 02/24/20   [provider]  vitamin B-12 (CYANOCOBALAMIN) 1000 MCG tablet Take 1,000 mcg by mouth daily.    [provider]    Physical Exam: Vitals:   05/09/20 0110 05/09/20 0130 05/09/20 0257 05/09/20 0259  BP: (!) 174/118 (!) 149/81 128/83 128/83  Pulse: (!) 118 (!) 107 (!) 103 (!) 102  Resp: (!) 34 (!) 30 (!) 26 16  Temp:      TempSrc:      SpO2: 96% 96% (S) (!) 84% 91%  Weight:      Height:         Vitals:   05/09/20 0110 05/09/20 0130 05/09/20 0257 05/09/20 0259  BP: (!) 174/118 (!) 149/81 128/83 128/83  Pulse: (!) 118 (!) 107 (!) 103 (!) 102  Resp: (!) 34 (!) 30 (!) 26 16  Temp:      TempSrc:      SpO2: 96% 96% (S) (!) 84% 91%  Weight:      Height:          Constitutional: Alert and oriented x 3 .  Mild respiratory distress  HEENT:      Head: Normocephalic and atraumatic.         Eyes: PERLA, EOMI, Conjunctivae are normal. Sclera is non-icteric.       Mouth/Throat: Mucous membranes are moist.       Neck: Supple with no signs of meningismus. Cardiovascular:  Tachycardia. No murmurs, gallops, or rubs. 2+ symmetrical distal pulses are present . No JVD. No LE edema Respiratory: Respiratory effort increased, tachypneic.Lungs sounds diminished with scattered rhonchi Gastrointestinal: Soft, non tender, and non distended with positive bowel  sounds.  Genitourinary: No CVA tenderness. Musculoskeletal: Nontender with normal range of motion in all extremities. No cyanosis, or erythema of extremities. Neurologic:  Face is symmetric. Moving all extremities. No gross focal neurologic deficits . Skin: Skin is warm, dry.  No rash or ulcers Psychiatric: Mood and affect are normal    Labs on Admission: I have personally reviewed following labs and imaging studies  CBC: Recent Labs  Lab 05/09/20 0043  WBC 8.9  NEUTROABS 6.5  HGB 14.4  HCT 42.9  MCV 89.6  PLT 132   Basic Metabolic Panel: Recent Labs  Lab 05/09/20 0043  NA 138  K 3.6  CL 103  CO2 26  GLUCOSE 145*  BUN 18  CREATININE 0.96  CALCIUM 9.3   GFR: Estimated Creatinine Clearance: 59.1 mL/min (by C-G formula based on SCr of 0.96 mg/dL). Liver Function Tests: Recent Labs  Lab 05/09/20 0043  AST 25  ALT 35  ALKPHOS 84  BILITOT 0.6  PROT 6.9  ALBUMIN 3.8   Recent Labs  Lab 05/09/20 0043  LIPASE 34   No results for input(s): AMMONIA in the last 168 hours. Coagulation Profile: No results for input(s): INR, PROTIME in the last 168 hours. Cardiac Enzymes: No results for input(s): CKTOTAL, CKMB, CKMBINDEX, TROPONINI in the last 168 hours. BNP (last 3 results) No results for input(s): PROBNP in the last 8760 hours. HbA1C: No results for input(s): HGBA1C in the last 72 hours. CBG: No results for input(s): GLUCAP in the last 168 hours. Lipid Profile: No results for input(s): CHOL, HDL, LDLCALC, TRIG, CHOLHDL, LDLDIRECT in the last 72 hours. Thyroid Function Tests: No results for input(s): TSH, T4TOTAL, FREET4, T3FREE, THYROIDAB in the last 72 hours. Anemia Panel: No results for input(s): VITAMINB12, FOLATE, FERRITIN, TIBC, IRON, RETICCTPCT in the last 72 hours. Urine analysis:    Component Value Date/Time   COLORURINE YELLOW (A) 12/18/2019 0213   APPEARANCEUR CLEAR (A) 12/18/2019 0213   APPEARANCEUR Clear 09/04/2019 1429   LABSPEC 1.017  12/18/2019 0213   LABSPEC 1.014 11/09/2013 1525   PHURINE 6.0 12/18/2019 0213   GLUCOSEU NEGATIVE 12/18/2019 0213   GLUCOSEU Negative 11/09/2013 1525   HGBUR NEGATIVE 12/18/2019 0213   BILIRUBINUR NEGATIVE 12/18/2019 0213   BILIRUBINUR Negative 09/04/2019 1429   BILIRUBINUR Negative 11/09/2013 [place] 12/18/2019 0213   PROTEINUR NEGATIVE 12/18/2019 0213   NITRITE NEGATIVE 12/18/2019 0213   LEUKOCYTESUR NEGATIVE 12/18/2019 0213   LEUKOCYTESUR Negative 11/09/2013 1525    Radiological Exams on Admission: DG Chest Portable 1 View  Result Date: 05/09/2020 CLINICAL DATA:  Shortness of breath EXAM: PORTABLE CHEST 1 VIEW COMPARISON:  Chest 04/30/2020 FINDINGS: The lungs are hyperinflated with diffuse interstitial prominence. No focal airspace consolidation or pulmonary edema. No pleural effusion or pneumothorax. Normal cardiomediastinal contours. IMPRESSION: COPD without acute airspace disease. Electronically Signed   By: Ulyses Jarred M.D.   On: 05/09/2020 01:05     Assessment/Plan Active Problems:   COPD with acute exacerbation (HCC)   Hypoxia   Schizophrenia ([place])   Hypertension   70 year old male with history of HTN, COPD, schizophrenia, 3 recent admissions for COPD exacerbation sent from group home with worsening shortness of breath not responding to home bronchodilators.  COPD with acute exacerbation ([place]) Acute respiratory failure with hypoxia ([place]) -Patient presents with  typical COPD exacerbations with increased work of breathing requiring O2 to maintain sats above 92%.  -Chest x-ray with no acute airspace disease -Scheduled and as needed nebulized bronchodilator therapy -IV steroids -Flutter valve, mucolytic's -Continue supplemental O2 and wean as tolerated  Essential hypertension -Elevated on arrival, likely related to respiratory distress - Continue home amlodipine/olmesartan orformulary substitution  Schizophrenia ([place]) -Continue home meds  pending med rec   DVT prophylaxis:Lovenox  Code Status:full code Family Communication:none Disposition Plan:Back to previous home environment Consults called:none Status observation     Athena Masse MD [facility]     05/09/2020, 3:45 AM

## 2020-05-09 NOTE — Progress Notes (Signed)
OT Cancellation Note  Patient Details Name: LIBERO PUTHOFF MRN: [id number] DOB: [birth year]   Cancelled Treatment:    Reason Eval/Treat Not Completed: Other (comment). Consult received, chart reviewed. Pt noted with increased BP this afternoon, mostly recently 161/103. Will hold OT evaluation and re-attempt at later time with improved BP for exertional activity.   Hanley Hays, MPH, MS, OTR/L ascom [phone] 05/09/20, 2:33 PM

## 2020-05-09 NOTE — ED Provider Notes (Signed)
[facility] Emergency Department Provider Note  ____________________________________________   Event Date/Time   First MD Initiated Contact with Patient 05/09/20 [phone]     (approximate)  I have reviewed the triage vital signs and the nursing notes.   HISTORY  Chief Complaint SOB   HPI Carlos Nelson is a 70 y.o. male with history of COPD, hypertension, schizophrenia who presents to the emergency department EMS from [facility] with complaints of shortness of breath for 1 day.  EMS reports sats were 89% on room air.  Placed on 3 L nasal cannula.  They gave him 1 DuoNeb, 1 albuterol nebulizer treatment and 125 mg of IV Solu-Medrol.  Patient is complaining of diffuse chest and abdominal pain as well but unable to describe this any further.  Repeatedly stating "I cannot breathe".        Past Medical History:  Diagnosis Date  . Chronic respiratory failure with hypoxia ([place])   . COPD (chronic obstructive pulmonary disease) ([place])   . Hypertension   . Schizophrenia [facility])     Patient Active Problem List   Diagnosis Date Noted  . Protein-calorie malnutrition, severe 03/18/2020  . COPD with acute exacerbation ([place]) 03/10/2020  . Right-sided chest pain 03/10/2020  . HLD (hyperlipidemia) 01/18/2020  . CAD (coronary artery disease) 01/18/2020  . Hypertensive urgency 01/18/2020  . Tobacco abuse 01/18/2020  . Scrotum pain   . Transaminitis   . Demand ischemia ([place]) 06/11/2019  . Malnutrition of moderate degree 06/10/2019  . NSTEMI (non-ST elevated myocardial infarction) ([place]) 06/09/2019  . Depression with anxiety 06/09/2019  . Pleuritic chest pain 05/20/2019  . CAP (community acquired pneumonia) 05/20/2019  . Hypertension   . COPD exacerbation ([place])   . Acute respiratory failure ([place])   . Altered mental status 09/25/2018  . Schizophrenia ([place])   . Tachycardia 08/21/2018  . Schizophrenia, chronic with acute exacerbation ([place]) 08/17/2018  .  Elevated PSA 05/24/2015    Past Surgical History:  Procedure Laterality Date  . COLONOSCOPY    . COLONOSCOPY WITH PROPOFOL N/A 12/13/2016   Procedure: COLONOSCOPY WITH PROPOFOL;  Surgeon: Lollie Sails, MD;  Location: [facility] ENDOSCOPY;  Service: Endoscopy;  Laterality: N/A;  . ESOPHAGOGASTRODUODENOSCOPY (EGD) WITH PROPOFOL N/A 12/13/2016   Procedure: ESOPHAGOGASTRODUODENOSCOPY (EGD) WITH PROPOFOL;  Surgeon: Lollie Sails, MD;  Location: [facility] ENDOSCOPY;  Service: Endoscopy;  Laterality: N/A;  . left arm surgery      Prior to Admission medications   Medication Sig Start Date End Date Taking? Authorizing Provider  albuterol (VENTOLIN HFA) 108 (90 Base) MCG/ACT inhaler Inhale 2 by mouth every 4 hours as needed for wheezing, cough, and/or shortness of breath 04/23/20   Nolberto Hanlon, MD  amlodipine-olmesartan (AZOR) 10-20 MG tablet Take 1 tablet by mouth daily. 03/23/20   Loel Dubonnet, NP  ARIPiprazole (ABILIFY) 30 MG tablet Take 30 mg by mouth daily. 01/29/20   [provider]  docusate sodium (COLACE) 100 MG capsule Take 1 capsule (100 mg total) by mouth 2 (two) times daily. 04/12/20 05/12/20  Harvest Dark, MD  donepezil (ARICEPT) 5 MG tablet Take 5 mg by mouth at bedtime.    [provider]  hydrOXYzine (ATARAX/VISTARIL) 50 MG tablet Take 50 mg by mouth every 6 (six) hours as needed for anxiety or itching. Patient not taking: Reported on 04/21/2020    [provider]  ipratropium-albuterol (DUONEB) 0.5-2.5 (3) MG/3ML SOLN Take 3 mLs by nebulization every 4 (four) hours for 14 days. 03/11/20  03/25/20  Fritzi Mandes, MD  pantoprazole (PROTONIX) 20 MG tablet Take 1 tablet (20 mg total) by mouth daily. 12/18/19 12/17/20  Lavonia Drafts, MD  polyethylene glycol powder (GLYCOLAX/MIRALAX) 17 GM/SCOOP powder Take 17 g by mouth daily as needed for moderate constipation. 04/12/20   Harvest Dark, MD  predniSONE (DELTASONE) 20 MG tablet Take 1 tablet (20 mg total) by  mouth daily. 04/24/20   Nolberto Hanlon, MD  QUEtiapine (SEROQUEL) 100 MG tablet Take 100 mg by mouth daily. Patient not taking: Reported on 04/21/2020    [provider]  QUEtiapine (SEROQUEL) 200 MG tablet Take 3 tablets (600 mg total) by mouth at bedtime. 02/03/20   Harvest Dark, MD  QUEtiapine (SEROQUEL) 25 MG tablet Take 25 mg by mouth daily. 02/25/20   [provider]  traZODone (DESYREL) 100 MG tablet Take 100 mg by mouth at bedtime. 02/24/20   [provider]  vitamin B-12 (CYANOCOBALAMIN) 1000 MCG tablet Take 1,000 mcg by mouth daily.    [provider]    Allergies Ace inhibitors  Family History  Problem Relation Age of Onset  . Prostate cancer Neg Hx   . Bladder Cancer Neg Hx     Social History Social History   Tobacco Use  . Smoking status: Current Every Day Smoker    Packs/day: 1.00    Types: Cigarettes  . Smokeless tobacco: Never Used  Substance Use Topics  . Alcohol use: Yes    Alcohol/week: 0.0 standard drinks    Comment: beer  . Drug use: Not Currently    Types: Marijuana    Review of Systems Level 5 caveat secondary to respiratory distress  ____________________________________________   PHYSICAL EXAM:  VITAL SIGNS: Today's Vitals   05/09/20 0110 05/09/20 0130 05/09/20 0257 05/09/20 0259  BP: (!) 174/118 (!) 149/81 128/83 128/83  Pulse: (!) 118 (!) 107 (!) 103 (!) 102  Resp: (!) 34 (!) 30 (!) 26 16  Temp:      TempSrc:      SpO2: 96% 96% (S) (!) 84% 91%  Weight:      Height:      PainSc:       Body mass index is 22.71 kg/m.  CONSTITUTIONAL: Alert and oriented and responds appropriately to questions.  Chronically ill-appearing, elderly HEAD: Normocephalic EYES: Conjunctivae clear, pupils appear equal, EOM appear intact ENT: normal nose; moist mucous membranes NECK: Supple, normal ROM CARD: Regular and tachycardic; S1 and S2 appreciated; no murmurs, no clicks, no rubs, no gallops RESP: Patient is  tachypneic, speaking truncated sentences, significantly diminished aeration diffusely, expiratory wheezes, no rhonchi or rales, on 3 L nasal cannula ABD/GI: Normal bowel sounds; non-distended; soft, non-tender, no rebound, no guarding, no peritoneal signs, no hepatosplenomegaly BACK: The back appears normal EXT: Normal ROM in all joints; no deformity noted, no edema; no cyanosis, no calf tenderness or calf swelling SKIN: Normal color for age and race; warm; no rash on exposed skin NEURO: Moves all extremities equally PSYCH: The patient's mood and manner are appropriate.  ____________________________________________   LABS (all labs ordered are listed, but only abnormal results are displayed)  Labs Reviewed  COMPREHENSIVE METABOLIC PANEL - Abnormal; Notable for the following components:      Result Value   Glucose, Bld 145 (*)    All other components within normal limits  BLOOD GAS, ARTERIAL - Abnormal; Notable for the following components:   pCO2 arterial 50 (*)    pO2, Arterial 188 (*)    All other components  within normal limits  CBC WITH DIFFERENTIAL/PLATELET  LIPASE, BLOOD  BRAIN NATRIURETIC PEPTIDE  URINALYSIS, ROUTINE W REFLEX MICROSCOPIC  TROPONIN I (HIGH SENSITIVITY)  TROPONIN I (HIGH SENSITIVITY)   ____________________________________________  EKG   EKG Interpretation  Date/Time:  Monday May 09 2020 00:31:07 EDT Ventricular Rate:  116 PR Interval:  130 QRS Duration: 94 QT Interval:  332 QTC Calculation: 462 R Axis:   154 Text Interpretation: Sinus tachycardia Paired ventricular premature complexes Aberrant conduction of SV complex(es) Biatrial enlargement Consider right ventricular hypertrophy Minimal ST elevation, anterolateral leads Confirmed by Pryor Curia [phone]) on 05/09/2020 1:53:41 AM       ____________________________________________  RADIOLOGY Jessie Foot Sly Parlee, personally viewed and evaluated these images (plain radiographs) as part of my medical  decision making, as well as reviewing the written report by the radiologist.  ED MD interpretation: Chest x-ray clear.  Official radiology report(s): DG Chest Portable 1 View  Result Date: 05/09/2020 CLINICAL DATA:  Shortness of breath EXAM: PORTABLE CHEST 1 VIEW COMPARISON:  Chest 04/30/2020 FINDINGS: The lungs are hyperinflated with diffuse interstitial prominence. No focal airspace consolidation or pulmonary edema. No pleural effusion or pneumothorax. Normal cardiomediastinal contours. IMPRESSION: COPD without acute airspace disease. Electronically Signed   By: Ulyses Jarred M.D.   On: 05/09/2020 01:05    ____________________________________________   PROCEDURES  Procedure(s) performed (including Critical Care):  Procedures  CRITICAL CARE Performed by: Cyril Mourning Maat Kafer   Total critical care time: 50 minutes  Critical care time was exclusive of separately billable procedures and treating other patients.  Critical care was necessary to treat or prevent imminent or life-threatening deterioration.  Critical care was time spent personally by me on the following activities: development of treatment plan with patient and/or surrogate as well as nursing, discussions with consultants, evaluation of patient's response to treatment, examination of patient, obtaining history from patient or surrogate, ordering and performing treatments and interventions, ordering and review of laboratory studies, ordering and review of radiographic studies, pulse oximetry and re-evaluation of patient's condition.  ____________________________________________   INITIAL IMPRESSION / ASSESSMENT AND PLAN / ED COURSE  As part of my medical decision making, I reviewed the following data within the [facility] notes reviewed and incorporated, Labs reviewed , EKG interpreted , Old EKG reviewed, Old chart reviewed, Radiograph reviewed , Discussed with admitting physician  and Notes from prior ED  visits         Patient here with COPD exacerbation.  Doubt CHF, ACS, PE, dissection.  He is in respiratory distress.  Will obtain labs, ABG, chest x-ray.  Was hypoxic with EMS and does not wear oxygen chronically.  ED PROGRESS  2:00 AM  Patient improving with breathing treatments but still tight.  Initial labs unremarkable including normal troponin, normal BNP, reassuring ABG.  Chest x-ray shows signs consistent with COPD without other acute airspace disease.  3:20 AM  Pt's O2 dropped to 84% on RA at rest after 3 nebs.  On 3L [place] now. Will admit.  3:30 AM  Discussed patient's case with hospitalist, Dr. Damita Dunnings.  I have recommended admission and patient (and family if present) agree with this plan. Admitting physician will place admission orders.   I reviewed all nursing notes, vitals, pertinent previous records and reviewed/interpreted all EKGs, lab and urine results, imaging (as available).   ____________________________________________   FINAL CLINICAL IMPRESSION(S) / ED DIAGNOSES  Final diagnoses:  COPD exacerbation ([place])  Acute respiratory failure with hypoxia [facility])     ED Discharge  Orders    None      *Please note:  Carlos Nelson was evaluated in Emergency Department on 05/09/2020 for the symptoms described in the history of present illness. He was evaluated in the context of the global COVID-19 pandemic, which necessitated consideration that the patient might be at risk for infection with the SARS-CoV-2 virus that causes COVID-19. Institutional protocols and algorithms that pertain to the evaluation of patients at risk for COVID-19 are in a state of rapid change based on information released by regulatory bodies including the CDC and federal and state organizations. These policies and algorithms were followed during the patient's care in the ED.  Some ED evaluations and interventions may be delayed as a result of limited staffing during and the pandemic.*   Note:  This  document was prepared using Dragon voice recognition software and may include unintentional dictation errors.   Raley Novicki, Delice Bison, DO 05/09/20 [phone]

## 2020-05-09 NOTE — ED Notes (Signed)
sats dropped to 88 on RA, additional breathing treatment started

## 2020-05-09 NOTE — Evaluation (Signed)
Physical Therapy Evaluation Patient Details Name: Carlos Nelson MRN: [id number] DOB: Sep 03, 1950 Today's Date: 05/09/2020   History of Present Illness  Carlos Nelson is a 70 y.o. male with medical history significant for HTN, COPD, schizophrenia hospitalized monthly for the past 3 months with COPD exacerbation and respiratory failure, who was sent from the group home tonight with shortness of breath typical for his exacerbations not responding to home bronchodilators.  He was hypoxic with EMS and placed on oxygen receiving duo nebs in route.    Clinical Impression  Pt received supine in bed upon arrival to room.  Pt was able to perform bed mobility with no difficulty and come into standing without assistance.  Pt able to ambulate around the nursing station in the ED with one standing rest break due to SOB.  Pt sats remained >90% throughout session as pt was educated on pursed lip breathing technique.  Pt continued back to room and upon sitting EOB he developed VTach and elevated HR.  Pt described heart flutter as "something is jumping up and down inside of me".  Pt requested to be go to Semi-Fowler position as nursing was notified of the cardiac implications.  Patient is at baseline, all education completed, and time is given to address all questions/concerns. No additional skilled PT services needed at this time, PT signing off. PT recommends daily ambulation ad lib or with nursing staff as needed to prevent deconditioning.       Follow Up Recommendations No PT follow up    Equipment Recommendations  None recommended by PT    Recommendations for Other Services       Precautions / Restrictions Precautions Precautions: None      Mobility  Bed Mobility Overal bed mobility: Independent                  Transfers Overall transfer level: Independent Equipment used: None                Ambulation/Gait Ambulation/Gait assistance: Supervision Gait Distance (Feet):  250 Feet Assistive device: None Gait Pattern/deviations: Step-through pattern Gait velocity: WFL   General Gait Details: Pt has SOB with ambulation, O2 sats remained in low 90's% on room air throughout session.  Stairs            Wheelchair Mobility    Modified Rankin (Stroke Patients Only)       Balance Overall balance assessment: No apparent balance deficits (not formally assessed)                                           Pertinent Vitals/Pain Pain Assessment: No/denies pain    Home Living Family/patient expects to be discharged to:: Group home                 Additional Comments: Reports group home is single-story with 3 steps, R rail to enter    Prior Function Level of Independence: Independent         Comments: reports no falls recently; AMB around yard, neigborhood.  No home O2.  Responsible for taking out trash at facility.     Hand Dominance        Extremity/Trunk Assessment   Upper Extremity Assessment Upper Extremity Assessment: Defer to OT evaluation    Lower Extremity Assessment Lower Extremity Assessment: Generalized weakness    Cervical / Trunk Assessment Cervical / Trunk  Assessment: Normal  Communication   Communication: Expressive difficulties;Other (comment)  Cognition Arousal/Alertness: Awake/alert Behavior During Therapy: WFL for tasks assessed/performed Overall Cognitive Status: Within Functional Limits for tasks assessed                                        General Comments      Exercises Other Exercises Other Exercises: Pt educated on role of PT and services provided during hospital stay.   Assessment/Plan    PT Assessment Patent does not need any further PT services  PT Problem List         PT Treatment Interventions      PT Goals (Current goals can be found in the Care Plan section)  Acute Rehab PT Goals Patient Stated Goal: to return home, breathe better PT Goal  Formulation: With patient Time For Goal Achievement: 05/23/20 Potential to Achieve Goals: Good    Frequency     Barriers to discharge        Co-evaluation               AM-PAC PT "6 Clicks" Mobility  Outcome Measure Help needed turning from your back to your side while in a flat bed without using bedrails?: None Help needed moving from lying on your back to sitting on the side of a flat bed without using bedrails?: None Help needed moving to and from a bed to a chair (including a wheelchair)?: None Help needed standing up from a chair using your arms (e.g., wheelchair or bedside chair)?: None Help needed to walk in hospital room?: None Help needed climbing 3-5 steps with a railing? : A Little 6 Click Score: 23    End of Session   Activity Tolerance: Other (comment) Patient left: in bed Nurse Communication: Mobility status      Time: 1111-1130 PT Time Calculation (min) (ACUTE ONLY): 19 min   Charges:     PT Treatments $Gait Training: 8-22 mins        Gwenlyn Saran, PT, DPT 05/09/20, 12:56 PM

## 2020-05-09 NOTE — Progress Notes (Signed)
PROGRESS NOTE    Carlos Nelson  [id number] DOB: 1950/03/04 DOA: 05/09/2020 PCP: [facility]   Brief Narrative:  Carlos Nelson is a 70 y.o. male with medical history significant for COPD, HTN and schizophrenia, hospitalized just 2 weeks prior with COPD exacerbation, pneumonia and hypoxia, who presents with a 1 day history of shortness of breath similar to previous COPD exacerbations with EMS reporting O2 sat of 89% on room air improving to the mid 90s on 3 L.  He was administered duo nebs in route as well as IV Solu-Medrol but on arrival continued to have increased work of breathing as well as anterior chest pain, abdominal worse with breathing.  Denies fever or chills, nausea or vomiting. Patient was treated with several DuoNeb treatments, IV Solu-Medrol, IV magnesium but continued to have increased work of breathing and continued to require O2 to keep sats above 92.  Hospitalist consulted for admission.  Assessment & Plan:   Active Problems:   Schizophrenia ([place])   Hypertension   Acute respiratory failure with hypoxia (H[facility])   COPD with acute exacerbation (H[facility])  Acute recurrent COPD with acute exacerbation ([place]) Acute respiratory failure with hypoxia ([place]) -Patient presents with typical COPD exacerbations with increased work of breathing requiring O2 to maintain sats above 92%.  -Chest x-ray with no acute airspace disease -Scheduled and as needed nebulized bronchodilator therapy -IV steroids -Flutter valve, mucolytic's -Continue supplemental O2 and wean as tolerated  Essential hypertension - Elevated on arrival, likely related to respiratory distress - Continue home amlodipine/olmesartan orformulary substitution  Schizophrenia ([place]) -Continue home medsincluding aripiprazole, donepezil, quetiapine -Per chart patient is to be to taking 200 mg Seroquel 3 times daily with 25 mg additional daily -during last hospitalization patient refused stating he only  took 200 mg nightly -we will continue 3 times daily dosing and if patient continues to refuse this medication that is certainly his right.  DVT prophylaxis: Lovenox 40 daily Code Status: Full Family Communication: None present  Status is: Inpatient  Dispo: The patient is from: Home              Anticipated d/c is to: To be determined              Anticipated d/c date is: Wound 48 to 72 hours              Patient currently not medically stable for discharge given profound dyspnea, hypoxia and COPD exacerbation  Consultants:   None  Procedures:   None  Antimicrobials:  None  Subjective: No acute issues or events overnight, respiratory status stabilizing if not minimally improved but not yet back to baseline denies chest pain nausea vomiting diarrhea constipation headache fevers or chills.  Objective: Vitals:   05/09/20 0430 05/09/20 0530 05/09/20 0600 05/09/20 0630  BP: (!) 131/91 (!) 144/109 131/82 (!) 144/86  Pulse: (!) 102 96 94 96  Resp: (!) 26 20 (!) 23   Temp:      TempSrc:      SpO2:  94% 94% 96%  Weight:      Height:       No intake or output data in the 24 hours ending 05/09/20 0732 Filed Weights   05/09/20 0032  Weight: 60 kg    Examination:  General:  Pleasantly resting in bed, No acute distress. HEENT:  Normocephalic atraumatic.  Sclerae nonicteric, noninjected.  Extraocular movements intact bilaterally. Neck:  Without mass or deformity.  Trachea is midline. Lungs: Decreased breath sounds bilaterally,  scant end expiratory wheeze without overt rales or rhonchi Heart:  Regular rate and rhythm.  Without murmurs, rubs, or gallops. Abdomen:  Soft, nontender, nondistended.  Without guarding or rebound. Extremities: Without cyanosis, clubbing, edema, or obvious deformity. Vascular:  Dorsalis pedis and posterior tibial pulses palpable bilaterally. Skin:  Warm and dry, no erythema, no ulcerations.   Data Reviewed: I have personally reviewed following labs  and imaging studies  CBC: Recent Labs  Lab 05/09/20 0043  WBC 8.9  NEUTROABS 6.5  HGB 14.4  HCT 42.9  MCV 89.6  PLT 784   Basic Metabolic Panel: Recent Labs  Lab 05/09/20 0043  NA 138  K 3.6  CL 103  CO2 26  GLUCOSE 145*  BUN 18  CREATININE 0.96  CALCIUM 9.3   GFR: Estimated Creatinine Clearance: 59.1 mL/min (by C-G formula based on SCr of 0.96 mg/dL). Liver Function Tests: Recent Labs  Lab 05/09/20 0043  AST 25  ALT 35  ALKPHOS 84  BILITOT 0.6  PROT 6.9  ALBUMIN 3.8   Recent Labs  Lab 05/09/20 0043  LIPASE 34   No results for input(s): AMMONIA in the last 168 hours. Coagulation Profile: No results for input(s): INR, PROTIME in the last 168 hours. Cardiac Enzymes: No results for input(s): CKTOTAL, CKMB, CKMBINDEX, TROPONINI in the last 168 hours. BNP (last 3 results) No results for input(s): PROBNP in the last 8760 hours. HbA1C: No results for input(s): HGBA1C in the last 72 hours. CBG: No results for input(s): GLUCAP in the last 168 hours. Lipid Profile: No results for input(s): CHOL, HDL, LDLCALC, TRIG, CHOLHDL, LDLDIRECT in the last 72 hours. Thyroid Function Tests: No results for input(s): TSH, T4TOTAL, FREET4, T3FREE, THYROIDAB in the last 72 hours. Anemia Panel: No results for input(s): VITAMINB12, FOLATE, FERRITIN, TIBC, IRON, RETICCTPCT in the last 72 hours. Sepsis Labs: No results for input(s): PROCALCITON, LATICACIDVEN in the last 168 hours.  Recent Results (from the past 240 hour(s))  Resp Panel by RT-PCR (Flu A&B, Covid) Nasopharyngeal Swab     Status: None   Collection Time: 04/30/20  5:47 AM   Specimen: Nasopharyngeal Swab; Nasopharyngeal(NP) swabs in vial transport medium  Result Value Ref Range Status   SARS Coronavirus 2 by RT PCR NEGATIVE NEGATIVE Final    Comment: (NOTE) SARS-CoV-2 target nucleic acids are NOT DETECTED.  The SARS-CoV-2 RNA is generally detectable in upper respiratory specimens during the acute phase of  infection. The lowest concentration of SARS-CoV-2 viral copies this assay can detect is 138 copies/mL. A negative result does not preclude SARS-Cov-2 infection and should not be used as the sole basis for treatment or other patient management decisions. A negative result may occur with  improper specimen collection/handling, submission of specimen other than nasopharyngeal swab, presence of viral mutation(s) within the areas targeted by this assay, and inadequate number of viral copies(<138 copies/mL). A negative result must be combined with clinical observations, patient history, and epidemiological information. The expected result is Negative.  Fact Sheet for Patients:  EntrepreneurPulse.com.au  Fact Sheet for Healthcare Providers:  IncredibleEmployment.be  This test is no t yet approved or cleared by the [place] FDA and  has been authorized for detection and/or diagnosis of SARS-CoV-2 by FDA under an Emergency Use Authorization (EUA). This EUA will remain  in effect (meaning this test can be used) for the duration of the COVID-19 declaration under Section 564(b)(1) of the Act, 21 U.S.C.section 360bbb-3(b)(1), unless the authorization is terminated  or revoked sooner.  Influenza A by PCR NEGATIVE NEGATIVE Final   Influenza B by PCR NEGATIVE NEGATIVE Final    Comment: (NOTE) The Xpert Xpress SARS-CoV-2/FLU/RSV plus assay is intended as an aid in the diagnosis of influenza from Nasopharyngeal swab specimens and should not be used as a sole basis for treatment. Nasal washings and aspirates are unacceptable for Xpert Xpress SARS-CoV-2/FLU/RSV testing.  Fact Sheet for Patients: EntrepreneurPulse.com.au  Fact Sheet for Healthcare Providers: IncredibleEmployment.be  This test is not yet approved or cleared by the [place] FDA and has been authorized for detection and/or diagnosis of SARS-CoV-2  by FDA under an Emergency Use Authorization (EUA). This EUA will remain in effect (meaning this test can be used) for the duration of the COVID-19 declaration under Section 564(b)(1) of the Act, 21 U.S.C. section 360bbb-3(b)(1), unless the authorization is terminated or revoked.  Performed at [facility], [address] [place], [place] [address]   Resp Panel by RT-PCR (Flu A&B, Covid) Urine, Clean Catch     Status: None   Collection Time: 05/09/20  5:32 AM   Specimen: Urine, Clean Catch; Nasopharyngeal(NP) swabs in vial transport medium  Result Value Ref Range Status   SARS Coronavirus 2 by RT PCR NEGATIVE NEGATIVE Final    Comment: (NOTE) SARS-CoV-2 target nucleic acids are NOT DETECTED.  The SARS-CoV-2 RNA is generally detectable in upper respiratory specimens during the acute phase of infection. The lowest concentration of SARS-CoV-2 viral copies this assay can detect is 138 copies/mL. A negative result does not preclude SARS-Cov-2 infection and should not be used as the sole basis for treatment or other patient management decisions. A negative result may occur with  improper specimen collection/handling, submission of specimen other than nasopharyngeal swab, presence of viral mutation(s) within the areas targeted by this assay, and inadequate number of viral copies(<138 copies/mL). A negative result must be combined with clinical observations, patient history, and epidemiological information. The expected result is Negative.  Fact Sheet for Patients:  EntrepreneurPulse.com.au  Fact Sheet for Healthcare Providers:  IncredibleEmployment.be  This test is no t yet approved or cleared by the [place] FDA and  has been authorized for detection and/or diagnosis of SARS-CoV-2 by FDA under an Emergency Use Authorization (EUA). This EUA will remain  in effect (meaning this test can be used) for the duration of the COVID-19  declaration under Section 564(b)(1) of the Act, 21 U.S.C.section 360bbb-3(b)(1), unless the authorization is terminated  or revoked sooner.       Influenza A by PCR NEGATIVE NEGATIVE Final   Influenza B by PCR NEGATIVE NEGATIVE Final    Comment: (NOTE) The Xpert Xpress SARS-CoV-2/FLU/RSV plus assay is intended as an aid in the diagnosis of influenza from Nasopharyngeal swab specimens and should not be used as a sole basis for treatment. Nasal washings and aspirates are unacceptable for Xpert Xpress SARS-CoV-2/FLU/RSV testing.  Fact Sheet for Patients: EntrepreneurPulse.com.au  Fact Sheet for Healthcare Providers: IncredibleEmployment.be  This test is not yet approved or cleared by the [place] FDA and has been authorized for detection and/or diagnosis of SARS-CoV-2 by FDA under an Emergency Use Authorization (EUA). This EUA will remain in effect (meaning this test can be used) for the duration of the COVID-19 declaration under Section 564(b)(1) of the Act, 21 U.S.C. section 360bbb-3(b)(1), unless the authorization is terminated or revoked.  Performed at [facility], [address], [place],  [address]          Radiology Studies: DG Chest Portable 1  View  Result Date: 05/09/2020 CLINICAL DATA:  Shortness of breath EXAM: PORTABLE CHEST 1 VIEW COMPARISON:  Chest 04/30/2020 FINDINGS: The lungs are hyperinflated with diffuse interstitial prominence. No focal airspace consolidation or pulmonary edema. No pleural effusion or pneumothorax. Normal cardiomediastinal contours. IMPRESSION: COPD without acute airspace disease. Electronically Signed   By: Ulyses Jarred M.D.   On: 05/09/2020 01:05        Scheduled Meds: . enoxaparin (LOVENOX) injection  40 mg Subcutaneous Q24H  . ipratropium-albuterol  3 mL Nebulization Q6H  . methylPREDNISolone (SOLU-MEDROL) injection  60 mg Intravenous Q6H   Followed by  . [START ON  05/10/2020] predniSONE  40 mg Oral Q breakfast    LOS: 0 days   Time spent: 65min  Ayme Short C Lonell Stamos, DO [facility]  If 7PM-7AM, please contact night-coverage [email or web address]  05/09/2020, 7:32 AM

## 2020-05-09 NOTE — ED Notes (Addendum)
Ex lav, mint green sent to lab with hiv tests in case am repeat labs ordered

## 2020-05-09 NOTE — ED Triage Notes (Signed)
Pt from group home, c/o shob since yest unrelieved by nebs, c/o yellow/clear productive cough

## 2020-05-09 NOTE — ED Notes (Signed)
Extra Blood culture x1, lactic, blue sent to lab

## 2020-05-10 DIAGNOSIS — I10 Essential (primary) hypertension: Secondary | ICD-10-CM | POA: Diagnosis not present

## 2020-05-10 DIAGNOSIS — J9601 Acute respiratory failure with hypoxia: Secondary | ICD-10-CM | POA: Diagnosis not present

## 2020-05-10 DIAGNOSIS — J441 Chronic obstructive pulmonary disease with (acute) exacerbation: Secondary | ICD-10-CM | POA: Diagnosis not present

## 2020-05-10 DIAGNOSIS — F209 Schizophrenia, unspecified: Secondary | ICD-10-CM | POA: Diagnosis not present

## 2020-05-10 MED ORDER — AMLODIPINE-OLMESARTAN 10-20 MG PO TABS: 1.0000 | ORAL_TABLET | Freq: Every day | ORAL | Status: DC

## 2020-05-10 MED ORDER — AMLODIPINE BESYLATE 10 MG PO TABS
10.0000 mg | ORAL_TABLET | Freq: Every day | ORAL | Status: DC
  Administered 2020-05-10: 10 mg via ORAL
  Filled 2020-05-10: qty 1

## 2020-05-10 MED ORDER — PREDNISONE 10 MG PO TABS: ORAL_TABLET | ORAL | 0 refills | Status: DC

## 2020-05-10 MED ORDER — ENSURE ENLIVE PO LIQD
237.0000 mL | Freq: Two times a day (BID) | ORAL | Status: DC
  Administered 2020-05-10: 237 mL via ORAL

## 2020-05-10 MED ORDER — ADULT MULTIVITAMIN W/MINERALS CH
1.0000 | ORAL_TABLET | Freq: Every day | ORAL | Status: DC
  Administered 2020-05-10: 1 via ORAL
  Filled 2020-05-10: qty 1

## 2020-05-10 MED ORDER — IRBESARTAN 150 MG PO TABS
150.0000 mg | ORAL_TABLET | Freq: Every day | ORAL | Status: DC
  Filled 2020-05-10: qty 1

## 2020-05-10 NOTE — Progress Notes (Signed)
Initial Nutrition Assessment  DOCUMENTATION CODES:  Severe malnutrition in context of chronic illness  INTERVENTION:   Continue current diet as ordered  Ensure Enlive po BID, each supplement provides 350 kcal and 20 grams of protein  MVI with minerals daily  NUTRITION DIAGNOSIS:  Severe Malnutrition related to chronic illness (COPD) as evidenced by severe fat depletion,severe muscle depletion.  GOAL:  Patient will meet greater than or equal to 90% of their needs  MONITOR:  PO intake,Supplement acceptance  REASON FOR ASSESSMENT:  Consult Assessment of nutrition requirement/status  ASSESSMENT:  Pt presented to ED with SOB and chest pain x 1 day. At baseline, resides at [facility] group home. Frequent admissions (6 in the last 12 months), has been followed by RD at past admissions. PMH includes HTN, COPD, schizophrenia, tobacco abuse (1 PPD), CAD.  Pt resting at the side of bed at the time of visit. Pt reports that he has been eating well and has gained weight recently. Still having some difficulty breathing today, but feeling improved. Pt endorses that he likes vanilla and chocolate ensure, will add to encourage adequate intake.    Average Meal Intake: . 4/25-4/26: 100% intake x 1 recorded meal  Relevant Scheduled Meds: . predniSONE  40 mg Oral Q breakfast   Labs reviewed  NUTRITION - FOCUSED PHYSICAL EXAM: Flowsheet Row Most Recent Value  Orbital Region Moderate depletion  Upper Arm Region Severe depletion  Thoracic and Lumbar Region Moderate depletion  Buccal Region Moderate depletion  Temple Region Severe depletion  Clavicle Bone Region Severe depletion  Clavicle and Acromion Bone Region Severe depletion  Scapular Bone Region Moderate depletion  Dorsal Hand Mild depletion  Patellar Region Severe depletion  Anterior Thigh Region Severe depletion  Posterior Calf Region Severe depletion  Edema (RD Assessment) Mild  Hair Reviewed  Eyes Reviewed  Mouth Reviewed   Skin Reviewed  Nails Reviewed     Diet Order:   Diet Order            Diet Heart Room service appropriate? Yes; Fluid consistency: Thin  Diet effective now                EDUCATION NEEDS:  No education needs have been identified at this time  Skin:  Skin Assessment: Reviewed RN Assessment  Last BM:  4/25 per RN documentation  Height:  Ht Readings from Last 1 Encounters:  05/09/20 5\' 4"  (1.626 m)   Weight:  Wt Readings from Last 1 Encounters:  05/09/20 60.5 kg    Ideal Body Weight:  59.1 kg  BMI:  Body mass index is 22.9 kg/m.  Estimated Nutritional Needs:   Kcal:  1700-1900 kcal  Protein:  85-95 g  Fluid:  >1.7 L/d  Ranell Patrick, RD, LDN Clinical Dietitian Pager on [place]

## 2020-05-10 NOTE — Progress Notes (Addendum)
OT Screen Note  Patient Details Name: Carlos Nelson MRN: [id number] DOB: Nov 09, 1950   Cancelled Treatment:    Reason Eval/Treat Not Completed: OT screened, no needs identified, will sign off. Consult received, chart reviewed. Upon attempt, pt ambulating in room independently, reports just finishing showering and dressing himself. Pt endorses a little fatigue. HR 125, SpO2 92% on room air. Pt called RN to replace telemetry. Pt at baseline independence. Encouraged pt to utilize rest breaks for activity to minimize SOB/over exertion. Pt verbalized understanding. No additional skilled OT needs. Will sign off. Please re-consult if additional needs arise.   Hanley Hays, MPH, MS, OTR/L ascom [phone] 05/10/20, 9:10 AM

## 2020-05-10 NOTE — Progress Notes (Signed)
Pt d/c to group home via group home vehicle this evening.  D/c paperwork was reviewed with patient and education was given on O2 tanks.  Pt expressed understanding.  Nurse also made sure group home works knew how to work o2 equipment and they stated "yes".  All belongings taken. PIV removed from L posterior wrist without issue.  NAD noted VSS.

## 2020-05-10 NOTE — Discharge Summary (Signed)
Physician Discharge Summary  YOAV SOCARRAS [id number] DOB: 1950-05-22 DOA: 05/09/2020  PCP: [facility]  Admit date: 05/09/2020 Discharge date: 05/10/2020  Admitted From: Home Disposition: Home   Recommendations for Outpatient Follow-up:  1. Follow up with PCP in 1-2 weeks 2. Please obtain BMP/CBC in one week 3. Please follow up with pulmonology as discussed  Home Health: None Equipment/Devices: Oxygen 2 L around-the-clock  Discharge Condition: Stable CODE STATUS: Full Diet recommendation: Low-salt low-fat diet as tolerated  Brief/Interim Summary: Truen Schaul Cogdellis a 70 y.o.malewith medical history significant forCOPD, HTN and schizophrenia, hospitalized just 2 weeks prior with COPD exacerbation, pneumonia and hypoxia, who presents with a 1 day history of shortness of breath similar to previous COPD exacerbations with EMS reporting O2 sat of 89% on room air improving to the mid 90s on 3 L. He was administered duo nebs in route as well as IV Solu-Medrol but on arrival continued to have increased work of breathing as well as anterior chest pain, abdominal worse with breathing. Denies fever or chills, nausea or vomiting. Patient was treated with several DuoNeb treatments, IV Solu-Medrol, IV magnesium but continued to have increased work of breathing and continued to require O2 to keep sats above 92. Hospitalist consulted for admission.  Patient admitted as above with acute recurrent hypoxia in the setting of COPD exacerbation recently admitted here just 2 weeks ago for similar episode with questionable pneumonia.  It appears patient's respiratory status continues to worsen, likely in the setting of advancing disease -cannot rule out medication noncompliance.  At this time patient appears well but continues to require oxygen as much is 2 L with exertion to maintain sats of 90%.  This is very likely his new baseline given his multiple episodes of hypoxia recently.  We  recommended close follow-up with PCP this week as well as pulmonology follow-up as scheduled the next 1 to 2 weeks to further evaluate respiratory status.  Patient will be discharged home with steroid taper and supplemental oxygen per ambulatory oxygen screening.  Patient otherwise stable and agreeable for discharge home.  Discharge Diagnoses:  Active Problems:   Schizophrenia ([place])   Hypertension   Acute respiratory failure with hypoxia (HCC)   COPD with acute exacerbation [facility])    Discharge Instructions  Discharge Instructions    Call MD for:  difficulty breathing, headache or visual disturbances   Complete by: As directed    Call MD for:  persistant dizziness or light-headedness   Complete by: As directed    Diet - low sodium heart healthy   Complete by: As directed    Increase activity slowly   Complete by: As directed      Allergies as of 05/10/2020      Reactions   Ace Inhibitors Swelling   Ok with benazapril, per grouphome      Medication List    TAKE these medications   albuterol 108 (90 Base) MCG/ACT inhaler Commonly known as: VENTOLIN HFA Inhale 2 by mouth every 4 hours as needed for wheezing, cough, and/or shortness of breath   amlodipine-olmesartan 10-20 MG tablet Commonly known as: AZOR Take 1 tablet by mouth daily.   ARIPiprazole 30 MG tablet Commonly known as: ABILIFY Take 30 mg by mouth daily.   donepezil 5 MG tablet Commonly known as: ARICEPT Take 5 mg by mouth at bedtime.   ipratropium-albuterol 0.5-2.5 (3) MG/3ML Soln Commonly known as: DUONEB Take 3 mLs by nebulization every 4 (four) hours for 14 days.   pantoprazole  20 MG tablet Commonly known as: Protonix Take 1 tablet (20 mg total) by mouth daily.   predniSONE 10 MG tablet Commonly known as: DELTASONE Take 4 tablets (40 mg total) by mouth daily for 3 days, THEN 3 tablets (30 mg total) daily for 3 days, THEN 2 tablets (20 mg total) daily for 3 days, THEN 1 tablet (10 mg total) daily for  3 days. Start taking on: May 10, 2020   QUEtiapine 200 MG tablet Commonly known as: SEROquel Take 3 tablets (600 mg total) by mouth at bedtime. What changed: how much to take   QUEtiapine 25 MG tablet Commonly known as: SEROQUEL Take 25 mg by mouth daily. What changed: Another medication with the same name was changed. Make sure you understand how and when to take each.   traZODone 100 MG tablet Commonly known as: DESYREL Take 100 mg by mouth at bedtime.            Durable Medical Equipment  (From admission, onward)         Start     Ordered   05/10/20 1425  DME Oxygen  Once       Question Answer Comment  Length of Need Lifetime   Mode or (Route) Nasal cannula   Liters per Minute 2   Frequency Continuous (stationary and portable oxygen unit needed)   Oxygen delivery system Gas      05/10/20 1425          Allergies  Allergen Reactions  . Ace Inhibitors Swelling    Ok with benazapril, per grouphome    Consultations: None  Procedures/Studies: DG Chest 2 View  Result Date: 04/30/2020 CLINICAL DATA:  Shortness of breath, evaluate for edema, infiltrate EXAM: CHEST - 2 VIEW COMPARISON:  04/21/2020 FINDINGS: Some streaky opacities present in the left upper lung on comparison radiograph are less well seen. Some chronic hyperinflation and coarsened interstitial changes are fairly similar to prior exam. No new consolidative process. No pneumothorax or visible effusion. The cardiomediastinal contours are unremarkable. No acute osseous or soft tissue abnormality. Telemetry leads overlie the chest. IMPRESSION: Some streaky opacities in the left upper lung on comparison radiograph are less well seen. Could reflect some interval resolution of previously seen airspace opacity or improving atelectatic changes. Hyperinflation and chronically coarsened interstitial changes. Compatible with history of COPD. Electronically Signed   By: Lovena Le M.D.   On: 04/30/2020 04:40   DG  Chest 2 View  Result Date: 04/11/2020 CLINICAL DATA:  Chest pain.  Abdominal distention. EXAM: CHEST - 2 VIEW COMPARISON:  Chest radiograph 04/02/2020.  CT 09/14/2019 FINDINGS: Moderately advanced emphysema with chronic hyperinflation. Normal heart size and mediastinal contours. No acute airspace disease. No pleural effusion, pneumothorax, or pulmonary edema. No acute osseous abnormalities are seen. Chronic flattening of many of thoracic and upper lumbar vertebra. There is colonic interposition under the right hemidiaphragm. IMPRESSION: 1. No acute abnormality. 2. Moderately advanced emphysema. Electronically Signed   By: Keith Rake M.D.   On: 04/11/2020 23:54   DG Abd 1 View  Result Date: 04/23/2020 CLINICAL DATA:  Shortness of breath.  COPD. EXAM: ABDOMEN - 1 VIEW COMPARISON:  April 12, 2020 FINDINGS: The bowel gas pattern is normal. No radio-opaque calculi or other significant radiographic abnormality are seen. Osteoarthritic changes of the lower lumbosacral spine. IMPRESSION: Negative. Electronically Signed   By: Fidela Salisbury M.D.   On: 04/23/2020 12:19   CT ABDOMEN PELVIS W CONTRAST  Result Date: 04/12/2020 CLINICAL DATA:  Nausea and vomiting abdominal distension EXAM: CT ABDOMEN AND PELVIS WITH CONTRAST TECHNIQUE: Multidetector CT imaging of the abdomen and pelvis was performed using the standard protocol following bolus administration of intravenous contrast. Oral contrast also administered. CONTRAST:  47mL OMNIPAQUE IOHEXOL 300 MG/ML  SOLN COMPARISON:  June 23, 2019 FINDINGS: Lower chest: There is mild scarring in the lung bases. There is no lung base edema or consolidation. Hepatobiliary: There is a degree of periportal edema. No focal liver lesions are evident. Nitrogen containing gallstones are seen throughout the gallbladder. The gallbladder wall does not appear appreciably thickened. There is no biliary duct dilatation. Pancreas: There is no pancreatic mass or inflammatory focus.  Spleen: No splenic lesions are evident. Adrenals/Urinary Tract: Adrenals bilaterally appear unremarkable. There is a cyst in the lower pole of the right kidney measuring 1.3 x 1.2 cm. There is a cyst in the posterior upper to mid left kidney measuring 1.2 x 1.0 cm. There is no appreciable hydronephrosis on either side. There is no renal or ureteral calculus on either side. The urinary bladder is distended without wall thickening. Stomach/Bowel: There is diffuse stool throughout much of the colon. There is somewhat diffuse distention of small bowel with fluid and air without a transition zone to suggest bowel obstruction. Note that this appearance is similar to findings on prior study. There is thickening in the wall of a portion of the gastric antrum. No ulceration is seen in this area. The terminal ileum region appears unremarkable. There is no apparent periappendiceal region inflammatory change. There is no demonstrable free air or portal venous air. Vascular/Lymphatic: There is no abdominal aortic aneurysm. There are scattered foci of calcification in iliac arteries. Major venous structures appear patent. No evident adenopathy in the abdomen or pelvis. Reproductive: Prostate is prominent and mildly inhomogeneous in attenuation. Seminal vesicles appear unremarkable. Other: No evident abscess or ascites in the abdomen or pelvis. Musculoskeletal: There is degenerative change in the lumbar spine. There is chronic anterior wedging at L1, stable. Disc degeneration is severe at L4-5 with marked disc space narrowing and sclerosis along each sides of the endplate at this level. This appearance is stable compared to the previous study. No blastic or lytic bone lesions are evident. No intramuscular or abdominal wall lesions are appreciable. IMPRESSION: 1. Diffuse stool in colon with suspected degree of constipation. Somewhat generalized distension of most loops of small bowel without transition zone. Question ileus. This  appearance was also present on prior CT from June 2021. 2. Suspect a degree of antral gastritis. No ulceration is appreciable by CT. 3.  Cholelithiasis. 4. There is mild periportal edema of uncertain etiology. Appropriate laboratory correlation to assess for potential hepatic etiology advised. 5. Prostate is prominent and mildly inhomogeneous in attenuation. This finding warrants clinical assessment and PSA evaluation. 6. Urinary bladder distended. Question a degree of bladder outlet obstruction. Note that the urinary bladder wall is not thickened. Electronically Signed   By: Lowella Grip III M.D.   On: 04/12/2020 08:10   DG Chest Portable 1 View  Result Date: 05/09/2020 CLINICAL DATA:  Shortness of breath EXAM: PORTABLE CHEST 1 VIEW COMPARISON:  Chest 04/30/2020 FINDINGS: The lungs are hyperinflated with diffuse interstitial prominence. No focal airspace consolidation or pulmonary edema. No pleural effusion or pneumothorax. Normal cardiomediastinal contours. IMPRESSION: COPD without acute airspace disease. Electronically Signed   By: Ulyses Jarred M.D.   On: 05/09/2020 01:05   DG Chest Portable 1 View  Result Date: 04/21/2020 CLINICAL DATA:  70 year old male  with shortness of breath. Respiratory distress. EXAM: PORTABLE CHEST 1 VIEW COMPARISON:  Chest radiographs 04/11/2020. Chest CT 10/15/2019 and earlier. FINDINGS: Portable AP upright view at 0341 hours. Lower lung volumes. Chronic pulmonary hyperinflation with centrilobular emphysema demonstrated on prior exams. Mediastinal contours remain normal. Visualized tracheal air column is within normal limits. No pneumothorax, pulmonary edema, or pleural effusion. Patchy peribronchial opacity in the left upper lobe is new. Questionable similar peribronchial opacity also at the medial left lung base. Elsewhere lung markings are stable. No acute osseous abnormality identified. IMPRESSION: Chronic pulmonary hyperinflation with patchy left lung peribronchial  opacity compatible with acute infectious exacerbation / bronchopneumonia. No pleural effusion is evident. Electronically Signed   By: Genevie Ann M.D.   On: 04/21/2020 04:11   DG Abd 2 Views  Result Date: 04/12/2020 CLINICAL DATA:  Abdominal distention. EXAM: ABDOMEN - 2 VIEW COMPARISON:  12/18/2019. FINDINGS: Large amount of stool noted throughout the colon. No bowel distention or free air noted. Air noted in the right upper quadrant most likely duodenal air. If symptoms persist CT of the abdomen pelvis can be obtained. Pelvic calcifications consistent phleboliths. Diffuse osteopenia. Degenerative changes lumbar spine and both hips. Mild bibasilar atelectasis. IMPRESSION: 1. Large amount of stool noted throughout the colon. No bowel distention or free air noted. 2. Air noted in the right upper quadrant most likely duodenal air. If symptoms persist CT of the abdomen pelvis can be obtained. Electronically Signed   By: Marcello Moores  Register   On: 04/12/2020 05:19     Subjective: No acute issues or events overnight, patient feels back to baseline, denies nausea vomiting diarrhea constipation headache fevers chills chest pain dyspnea with exertion.   Discharge Exam: Vitals:   05/10/20 0801 05/10/20 1222  BP:  (!) 177/109  Pulse:  99  Resp:  16  Temp:  (!) 97.4 F (36.3 C)  SpO2: 94% 94%   Vitals:   05/10/20 0412 05/10/20 0756 05/10/20 0801 05/10/20 1222  BP: (!) 174/90 (!) 170/109  (!) 177/109  Pulse: (!) 108 96  99  Resp: 20 15  16   Temp: 97.7 F (36.5 C) 99 F (37.2 C)  (!) 97.4 F (36.3 C)  TempSrc: Oral Oral  Oral  SpO2: 97% 97% 94% 94%  Weight:      Height:        General: Pt is alert, awake, not in acute distress Cardiovascular: RRR, S1/S2 +, no rubs, no gallops Respiratory: CTA bilaterally, no wheezing, no rhonchi Abdominal: Soft, NT, ND, bowel sounds + Extremities: no edema, no cyanosis    The results of significant diagnostics from this hospitalization (including imaging,  microbiology, ancillary and laboratory) are listed below for reference.     Microbiology: Recent Results (from the past 240 hour(s))  Resp Panel by RT-PCR (Flu A&B, Covid) Urine, Clean Catch     Status: None   Collection Time: 05/09/20  5:32 AM   Specimen: Urine, Clean Catch; Nasopharyngeal(NP) swabs in vial transport medium  Result Value Ref Range Status   SARS Coronavirus 2 by RT PCR NEGATIVE NEGATIVE Final    Comment: (NOTE) SARS-CoV-2 target nucleic acids are NOT DETECTED.  The SARS-CoV-2 RNA is generally detectable in upper respiratory specimens during the acute phase of infection. The lowest concentration of SARS-CoV-2 viral copies this assay can detect is 138 copies/mL. A negative result does not preclude SARS-Cov-2 infection and should not be used as the sole basis for treatment or other patient management decisions. A negative result may occur with  improper specimen collection/handling, submission of specimen other than nasopharyngeal swab, presence of viral mutation(s) within the areas targeted by this assay, and inadequate number of viral copies(<138 copies/mL). A negative result must be combined with clinical observations, patient history, and epidemiological information. The expected result is Negative.  Fact Sheet for Patients:  BloggerCourse.comhttps://www.fda.gov/media/152166/download  Fact Sheet for Healthcare Providers:  SeriousBroker.ithttps://www.fda.gov/media/152162/download  This test is no t yet approved or cleared by the [place] FDA and  has been authorized for detection and/or diagnosis of SARS-CoV-2 by FDA under an Emergency Use Authorization (EUA). This EUA will remain  in effect (meaning this test can be used) for the duration of the COVID-19 declaration under Section 564(b)(1) of the Act, 21 U.S.C.section 360bbb-3(b)(1), unless the authorization is terminated  or revoked sooner.       Influenza A by PCR NEGATIVE NEGATIVE Final   Influenza B by PCR NEGATIVE NEGATIVE Final     Comment: (NOTE) The Xpert Xpress SARS-CoV-2/FLU/RSV plus assay is intended as an aid in the diagnosis of influenza from Nasopharyngeal swab specimens and should not be used as a sole basis for treatment. Nasal washings and aspirates are unacceptable for Xpert Xpress SARS-CoV-2/FLU/RSV testing.  Fact Sheet for Patients: BloggerCourse.comhttps://www.fda.gov/media/152166/download  Fact Sheet for Healthcare Providers: SeriousBroker.ithttps://www.fda.gov/media/152162/download  This test is not yet approved or cleared by the [place] FDA and has been authorized for detection and/or diagnosis of SARS-CoV-2 by FDA under an Emergency Use Authorization (EUA). This EUA will remain in effect (meaning this test can be used) for the duration of the COVID-19 declaration under Section 564(b)(1) of the Act, 21 U.S.C. section 360bbb-3(b)(1), unless the authorization is terminated or revoked.  Performed at [facility], [address] [place], [place] [id number]      Labs: BNP (last 3 results) Recent Labs    04/02/20 0303 04/22/20 0811 05/09/20 0043  BNP 18.4 82.6 25.4   Basic Metabolic Panel: Recent Labs  Lab 05/09/20 0043  NA 138  K 3.6  CL 103  CO2 26  GLUCOSE 145*  BUN 18  CREATININE 0.96  CALCIUM 9.3   Liver Function Tests: Recent Labs  Lab 05/09/20 0043  AST 25  ALT 35  ALKPHOS 84  BILITOT 0.6  PROT 6.9  ALBUMIN 3.8   Recent Labs  Lab 05/09/20 0043  LIPASE 34   No results for input(s): AMMONIA in the last 168 hours. CBC: Recent Labs  Lab 05/09/20 0043  WBC 8.9  NEUTROABS 6.5  HGB 14.4  HCT 42.9  MCV 89.6  PLT 201   Cardiac Enzymes: No results for input(s): CKTOTAL, CKMB, CKMBINDEX, TROPONINI in the last 168 hours. BNP: Invalid input(s): POCBNP CBG: No results for input(s): GLUCAP in the last 168 hours. D-Dimer No results for input(s): DDIMER in the last 72 hours. Hgb A1c No results for input(s): HGBA1C in the last 72 hours. Lipid Profile No results for  input(s): CHOL, HDL, LDLCALC, TRIG, CHOLHDL, LDLDIRECT in the last 72 hours. Thyroid function studies No results for input(s): TSH, T4TOTAL, T3FREE, THYROIDAB in the last 72 hours.  Invalid input(s): FREET3 Anemia work up No results for input(s): VITAMINB12, FOLATE, FERRITIN, TIBC, IRON, RETICCTPCT in the last 72 hours. Urinalysis    Component Value Date/Time   COLORURINE YELLOW (A) 05/09/2020 0525   APPEARANCEUR CLEAR (A) 05/09/2020 0525   APPEARANCEUR Clear 09/04/2019 1429   LABSPEC 1.016 05/09/2020 0525   LABSPEC 1.014 11/09/2013 1525   PHURINE 5.0 05/09/2020 0525   GLUCOSEU NEGATIVE 05/09/2020 0525  GLUCOSEU Negative 11/09/2013 [place] 05/09/2020 0525   BILIRUBINUR NEGATIVE 05/09/2020 0525   BILIRUBINUR Negative 09/04/2019 1429   BILIRUBINUR Negative 11/09/2013 [place] 05/09/2020 0525   PROTEINUR NEGATIVE 05/09/2020 0525   NITRITE NEGATIVE 05/09/2020 0525   LEUKOCYTESUR NEGATIVE 05/09/2020 0525   LEUKOCYTESUR Negative 11/09/2013 1525   Sepsis Labs Invalid input(s): PROCALCITONIN,  WBC,  LACTICIDVEN Microbiology Recent Results (from the past 240 hour(s))  Resp Panel by RT-PCR (Flu A&B, Covid) Urine, Clean Catch     Status: None   Collection Time: 05/09/20  5:32 AM   Specimen: Urine, Clean Catch; Nasopharyngeal(NP) swabs in vial transport medium  Result Value Ref Range Status   SARS Coronavirus 2 by RT PCR NEGATIVE NEGATIVE Final    Comment: (NOTE) SARS-CoV-2 target nucleic acids are NOT DETECTED.  The SARS-CoV-2 RNA is generally detectable in upper respiratory specimens during the acute phase of infection. The lowest concentration of SARS-CoV-2 viral copies this assay can detect is 138 copies/mL. A negative result does not preclude SARS-Cov-2 infection and should not be used as the sole basis for treatment or other patient management decisions. A negative result may occur with  improper specimen collection/handling, submission of  specimen other than nasopharyngeal swab, presence of viral mutation(s) within the areas targeted by this assay, and inadequate number of viral copies(<138 copies/mL). A negative result must be combined with clinical observations, patient history, and epidemiological information. The expected result is Negative.  Fact Sheet for Patients:  EntrepreneurPulse.com.au  Fact Sheet for Healthcare Providers:  IncredibleEmployment.be  This test is no t yet approved or cleared by the [place] FDA and  has been authorized for detection and/or diagnosis of SARS-CoV-2 by FDA under an Emergency Use Authorization (EUA). This EUA will remain  in effect (meaning this test can be used) for the duration of the COVID-19 declaration under Section 564(b)(1) of the Act, 21 U.S.C.section 360bbb-3(b)(1), unless the authorization is terminated  or revoked sooner.       Influenza A by PCR NEGATIVE NEGATIVE Final   Influenza B by PCR NEGATIVE NEGATIVE Final    Comment: (NOTE) The Xpert Xpress SARS-CoV-2/FLU/RSV plus assay is intended as an aid in the diagnosis of influenza from Nasopharyngeal swab specimens and should not be used as a sole basis for treatment. Nasal washings and aspirates are unacceptable for Xpert Xpress SARS-CoV-2/FLU/RSV testing.  Fact Sheet for Patients: EntrepreneurPulse.com.au  Fact Sheet for Healthcare Providers: IncredibleEmployment.be  This test is not yet approved or cleared by the [place] FDA and has been authorized for detection and/or diagnosis of SARS-CoV-2 by FDA under an Emergency Use Authorization (EUA). This EUA will remain in effect (meaning this test can be used) for the duration of the COVID-19 declaration under Section 564(b)(1) of the Act, 21 U.S.C. section 360bbb-3(b)(1), unless the authorization is terminated or revoked.  Performed at [facility], [address] [place], [place] [address]      Time coordinating discharge: Over 30 minutes  SIGNED:   Little Ishikawa, DO [facility] 05/10/2020, 2:27 PM Pager   If 7PM-7AM, please contact night-coverage [email or web address]

## 2020-05-10 NOTE — Progress Notes (Signed)
SATURATION QUALIFICATIONS: (This note is used to comply with regulatory documentation for home oxygen)  Patient Saturations on Room Air at Rest = 82%  Patient Saturations on Room Air while Ambulating = 78%  Patient Saturations on 2 Liters of oxygen while Ambulating = 94%  Please briefly explain why patient needs home oxygen: Pt desats at rest and with activity without o2 therapy.

## 2020-05-10 NOTE — TOC Transition Note (Signed)
Transition of Care [facility]) - CM/SW Discharge Note   Patient Details  Name: Carlos Nelson MRN: [id number] Date of Birth: 09/09/1950  [facility] CM/SW Contact:  Pete Pelt, RN Phone Number: 05/10/2020, 4:17 PM   Clinical Narrative:   TOC in to see patient, RN in room.  Patient is a resident of [facility].  Patient will need 2Lo2 continuous upon discharge.  TOC spoke with Tomi Bamberger at [facility], who was initially concerned about patient going home with O2 and being a smoker.  TOC and RN spoke with patient about having O2 and refraining from smoking and the dangers this poses.  Patient verbalized understanding.  TOC spoke with Tomi Bamberger, who is aware of the dangers of smoking and O2 use, and relayed the acknowledgment from patient.  Tomi Bamberger states they are quite familiar with the patient and the home is willing to accept the patient with oxygen at this time, and the staff at the home will supervise O2 and have patient refrain from smoking.  Rhonda at [facility] to deliver O2 prior to discharge.  No further questions from patient or Tomi Bamberger.    Final next level of care: Group Home Barriers to Discharge: Barriers Resolved   Patient Goals and CMS Choice     Choice offered to / list presented to : NA  Discharge Placement                       Discharge Plan and Services     Post Acute Care Choice: Resumption of Svcs/PTA Provider          DME Arranged: Oxygen DME Agency: [facility] Date DME Agency Contacted: 05/10/20 Time DME Agency Contacted: [phone] Representative spoke with at DME Agency: Suanne Marker HH Arranged:  (n/a)          Social Determinants of Health (SDOH) Interventions     Readmission Risk Interventions No flowsheet data found.

## 2020-05-21 ENCOUNTER — Emergency Department: Payer: Medicare Other

## 2020-05-21 ENCOUNTER — Other Ambulatory Visit: Payer: Self-pay

## 2020-05-21 ENCOUNTER — Inpatient Hospital Stay
Admission: EM | Admit: 2020-05-21 | Discharge: 2020-05-23 | DRG: 871 | Disposition: A | Discharge status: Home / Self Care | Payer: Medicare Other | Attending: Internal Medicine | Admitting: Internal Medicine

## 2020-05-21 DIAGNOSIS — Z9114 Patient's other noncompliance with medication regimen: Secondary | ICD-10-CM | POA: Diagnosis not present

## 2020-05-21 DIAGNOSIS — F32A Depression, unspecified: Secondary | ICD-10-CM | POA: Diagnosis present

## 2020-05-21 DIAGNOSIS — Z20822 Contact with and (suspected) exposure to covid-19: Secondary | ICD-10-CM | POA: Diagnosis present

## 2020-05-21 DIAGNOSIS — J9601 Acute respiratory failure with hypoxia: Secondary | ICD-10-CM | POA: Diagnosis not present

## 2020-05-21 DIAGNOSIS — I252 Old myocardial infarction: Secondary | ICD-10-CM | POA: Diagnosis not present

## 2020-05-21 DIAGNOSIS — I10 Essential (primary) hypertension: Secondary | ICD-10-CM | POA: Diagnosis present

## 2020-05-21 DIAGNOSIS — K219 Gastro-esophageal reflux disease without esophagitis: Secondary | ICD-10-CM | POA: Diagnosis present

## 2020-05-21 DIAGNOSIS — Z888 Allergy status to other drugs, medicaments and biological substances status: Secondary | ICD-10-CM | POA: Diagnosis not present

## 2020-05-21 DIAGNOSIS — F209 Schizophrenia, unspecified: Secondary | ICD-10-CM | POA: Diagnosis present

## 2020-05-21 DIAGNOSIS — R0609 Other forms of dyspnea: Secondary | ICD-10-CM | POA: Diagnosis not present

## 2020-05-21 DIAGNOSIS — J9622 Acute and chronic respiratory failure with hypercapnia: Secondary | ICD-10-CM | POA: Diagnosis present

## 2020-05-21 DIAGNOSIS — F419 Anxiety disorder, unspecified: Secondary | ICD-10-CM | POA: Diagnosis present

## 2020-05-21 DIAGNOSIS — I2510 Atherosclerotic heart disease of native coronary artery without angina pectoris: Secondary | ICD-10-CM | POA: Diagnosis present

## 2020-05-21 DIAGNOSIS — R071 Chest pain on breathing: Secondary | ICD-10-CM | POA: Diagnosis not present

## 2020-05-21 DIAGNOSIS — E785 Hyperlipidemia, unspecified: Secondary | ICD-10-CM | POA: Diagnosis present

## 2020-05-21 DIAGNOSIS — Z720 Tobacco use: Secondary | ICD-10-CM | POA: Diagnosis present

## 2020-05-21 DIAGNOSIS — Z9119 Patient's noncompliance with other medical treatment and regimen: Secondary | ICD-10-CM | POA: Diagnosis not present

## 2020-05-21 DIAGNOSIS — F418 Other specified anxiety disorders: Secondary | ICD-10-CM | POA: Diagnosis present

## 2020-05-21 DIAGNOSIS — J441 Chronic obstructive pulmonary disease with (acute) exacerbation: Secondary | ICD-10-CM | POA: Diagnosis present

## 2020-05-21 DIAGNOSIS — I160 Hypertensive urgency: Secondary | ICD-10-CM | POA: Diagnosis present

## 2020-05-21 DIAGNOSIS — Z79899 Other long term (current) drug therapy: Secondary | ICD-10-CM

## 2020-05-21 DIAGNOSIS — J9621 Acute and chronic respiratory failure with hypoxia: Secondary | ICD-10-CM | POA: Diagnosis present

## 2020-05-21 DIAGNOSIS — F17210 Nicotine dependence, cigarettes, uncomplicated: Secondary | ICD-10-CM | POA: Diagnosis present

## 2020-05-21 DIAGNOSIS — I248 Other forms of acute ischemic heart disease: Secondary | ICD-10-CM | POA: Diagnosis present

## 2020-05-21 DIAGNOSIS — R079 Chest pain, unspecified: Secondary | ICD-10-CM | POA: Diagnosis present

## 2020-05-21 DIAGNOSIS — A419 Sepsis, unspecified organism: Secondary | ICD-10-CM | POA: Diagnosis not present

## 2020-05-21 DIAGNOSIS — F0390 Unspecified dementia without behavioral disturbance: Secondary | ICD-10-CM | POA: Diagnosis present

## 2020-05-21 DIAGNOSIS — J9602 Acute respiratory failure with hypercapnia: Secondary | ICD-10-CM

## 2020-05-21 LAB — COMPREHENSIVE METABOLIC PANEL
ALT: 45 U/L — ABNORMAL HIGH (ref 0–44)
AST: 43 U/L — ABNORMAL HIGH (ref 15–41)
Albumin: 4 g/dL (ref 3.5–5.0)
Alkaline Phosphatase: 82 U/L (ref 38–126)
Anion gap: 9 (ref 5–15)
BUN: 11 mg/dL (ref 8–23)
CO2: 27 mmol/L (ref 22–32)
Calcium: 9.2 mg/dL (ref 8.9–10.3)
Chloride: 103 mmol/L (ref 98–111)
Creatinine, Ser: 1.05 mg/dL (ref 0.61–1.24)
GFR, Estimated: >60 mL/min (ref >60)
Glucose, Bld: 121 mg/dL — ABNORMAL HIGH (ref 70–99)
Potassium: 4.1 mmol/L (ref 3.5–5.1)
Sodium: 139 mmol/L (ref 135–145)
Total Bilirubin: 0.9 mg/dL (ref 0.3–1.2)
Total Protein: 7 g/dL (ref 6.5–8.1)

## 2020-05-21 LAB — RESP PANEL BY RT-PCR (FLU A&B, COVID) ARPGX2
Influenza A by PCR: NEGATIVE
Influenza B by PCR: NEGATIVE
SARS Coronavirus 2 by RT PCR: NEGATIVE

## 2020-05-21 LAB — CBC WITH DIFFERENTIAL/PLATELET
Abs Immature Granulocytes: 0.17 10*3/uL — ABNORMAL HIGH (ref 0.00–0.07)
Basophils Absolute: 0.1 10*3/uL (ref 0.0–0.1)
Basophils Relative: 0 %
Eosinophils Absolute: 0.2 10*3/uL (ref 0.0–0.5)
Eosinophils Relative: 1 %
HCT: 47 % (ref 39.0–52.0)
Hemoglobin: 15.2 g/dL (ref 13.0–17.0)
Immature Granulocytes: 1 %
Lymphocytes Relative: 29 %
Lymphs Abs: 5.1 10*3/uL — ABNORMAL HIGH (ref 0.7–4.0)
MCH: 30.4 pg (ref 26.0–34.0)
MCHC: 32.3 g/dL (ref 30.0–36.0)
MCV: 94 fL (ref 80.0–100.0)
Monocytes Absolute: 1.3 10*3/uL — ABNORMAL HIGH (ref 0.1–1.0)
Monocytes Relative: 8 %
Neutro Abs: 10.9 10*3/uL — ABNORMAL HIGH (ref 1.7–7.7)
Neutrophils Relative %: 61 %
Platelets: 276 10*3/uL (ref 150–400)
RBC: 5 MIL/uL (ref 4.22–5.81)
RDW: 13.9 % (ref 11.5–15.5)
WBC: 17.7 10*3/uL — ABNORMAL HIGH (ref 4.0–10.5)
nRBC: 0 % (ref 0.0–0.2)

## 2020-05-21 LAB — APTT: aPTT: 26 seconds (ref 24–36)

## 2020-05-21 LAB — BLOOD GAS, VENOUS
Acid-Base Excess: 5 mmol/L — ABNORMAL HIGH (ref 0.0–2.0)
Acid-Base Excess: 2.7 mmol/L — ABNORMAL HIGH (ref 0.0–2.0)
Bicarbonate: 36.8 mmol/L — ABNORMAL HIGH (ref 20.0–28.0)
Bicarbonate: 32.6 mmol/L — ABNORMAL HIGH (ref 20.0–28.0)
O2 Saturation: 49.2 %
O2 Saturation: 29.1 %
Patient temperature: 37
Patient temperature: 37
pCO2, Ven: 92 mmHg (ref 44.0–60.0)
pCO2, Ven: 76 mmHg (ref 44.0–60.0)
pH, Ven: 7.21 — ABNORMAL LOW (ref 7.250–7.430)
pH, Ven: 7.24 — ABNORMAL LOW (ref 7.250–7.430)
pO2, Ven: 33 mmHg (ref 32.0–45.0)
pO2, Ven: <31 mmHg — CL (ref 32.0–45.0)

## 2020-05-21 LAB — D-DIMER, QUANTITATIVE: D-Dimer, Quant: <0.27 ug/mL-FEU (ref 0.00–0.50)

## 2020-05-21 LAB — TROPONIN I (HIGH SENSITIVITY)
Troponin I (High Sensitivity): 17 ng/L (ref <18)
Troponin I (High Sensitivity): 20 ng/L — ABNORMAL HIGH (ref <18)
Troponin I (High Sensitivity): 19 ng/L — ABNORMAL HIGH (ref <18)

## 2020-05-21 LAB — PROTIME-INR
INR: 0.9 (ref 0.8–1.2)
Prothrombin Time: 11.9 seconds (ref 11.4–15.2)

## 2020-05-21 LAB — PROCALCITONIN: Procalcitonin: <0.1 ng/mL

## 2020-05-21 LAB — LACTIC ACID, PLASMA: Lactic Acid, Venous: 1.5 mmol/L (ref 0.5–1.9)

## 2020-05-21 LAB — MAGNESIUM: Magnesium: 1.9 mg/dL (ref 1.7–2.4)

## 2020-05-21 MED ORDER — PANTOPRAZOLE SODIUM 20 MG PO TBEC
20.0000 mg | DELAYED_RELEASE_TABLET | Freq: Every day | ORAL | Status: DC
  Administered 2020-05-22 – 2020-05-23 (×2): 20 mg via ORAL
  Filled 2020-05-21 (×4): qty 1

## 2020-05-21 MED ORDER — NICOTINE 21 MG/24HR TD PT24
21.0000 mg | MEDICATED_PATCH | Freq: Every day | TRANSDERMAL | Status: DC
  Filled 2020-05-21 (×2): qty 1

## 2020-05-21 MED ORDER — ONDANSETRON HCL 4 MG/2ML IJ SOLN
INTRAMUSCULAR | Status: AC
  Administered 2020-05-21: 4 mg via INTRAVENOUS
  Filled 2020-05-21: qty 2

## 2020-05-21 MED ORDER — ONDANSETRON HCL 4 MG/2ML IJ SOLN: 4.0000 mg | Freq: Once | INTRAMUSCULAR | Status: AC

## 2020-05-21 MED ORDER — DOXYCYCLINE HYCLATE 100 MG PO TABS
100.0000 mg | ORAL_TABLET | Freq: Two times a day (BID) | ORAL | Status: DC
  Administered 2020-05-22 – 2020-05-23 (×3): 100 mg via ORAL
  Filled 2020-05-21 (×3): qty 1

## 2020-05-21 MED ORDER — SODIUM CHLORIDE 0.9 % IV SOLN
500.0000 mg | Freq: Once | INTRAVENOUS | Status: AC
  Administered 2020-05-21: 500 mg via INTRAVENOUS
  Filled 2020-05-21: qty 500

## 2020-05-21 MED ORDER — ARIPIPRAZOLE 15 MG PO TABS
30.0000 mg | ORAL_TABLET | Freq: Every day | ORAL | Status: DC
  Administered 2020-05-21 – 2020-05-23 (×3): 30 mg via ORAL
  Filled 2020-05-21 (×4): qty 2

## 2020-05-21 MED ORDER — IPRATROPIUM-ALBUTEROL 0.5-2.5 (3) MG/3ML IN SOLN
9.0000 mL | Freq: Once | RESPIRATORY_TRACT | Status: AC
  Administered 2020-05-21: 9 mL via RESPIRATORY_TRACT

## 2020-05-21 MED ORDER — NITROGLYCERIN 0.4 MG SL SUBL
0.4000 mg | SUBLINGUAL_TABLET | SUBLINGUAL | Status: DC | PRN
  Administered 2020-05-21: 0.4 mg via SUBLINGUAL
  Filled 2020-05-21: qty 1

## 2020-05-21 MED ORDER — QUETIAPINE FUMARATE 300 MG PO TABS
600.0000 mg | ORAL_TABLET | Freq: Every day | ORAL | Status: DC
  Administered 2020-05-21 – 2020-05-22 (×2): 600 mg via ORAL
  Filled 2020-05-21 (×3): qty 2

## 2020-05-21 MED ORDER — ONDANSETRON HCL 4 MG/2ML IJ SOLN: 4.0000 mg | Freq: Three times a day (TID) | INTRAMUSCULAR | Status: DC | PRN

## 2020-05-21 MED ORDER — DM-GUAIFENESIN ER 30-600 MG PO TB12
1.0000 | ORAL_TABLET | Freq: Two times a day (BID) | ORAL | Status: DC | PRN
  Administered 2020-05-21 – 2020-05-23 (×2): 1 via ORAL
  Filled 2020-05-21 (×2): qty 1

## 2020-05-21 MED ORDER — ENOXAPARIN SODIUM 40 MG/0.4ML IJ SOSY
40.0000 mg | PREFILLED_SYRINGE | INTRAMUSCULAR | Status: DC
  Administered 2020-05-21 – 2020-05-23 (×3): 40 mg via SUBCUTANEOUS
  Filled 2020-05-21 (×3): qty 0.4

## 2020-05-21 MED ORDER — ALBUTEROL SULFATE (2.5 MG/3ML) 0.083% IN NEBU
2.5000 mg | INHALATION_SOLUTION | RESPIRATORY_TRACT | Status: DC | PRN
  Administered 2020-05-21: 2.5 mg via RESPIRATORY_TRACT

## 2020-05-21 MED ORDER — IPRATROPIUM-ALBUTEROL 0.5-2.5 (3) MG/3ML IN SOLN
3.0000 mL | Freq: Once | RESPIRATORY_TRACT | Status: DC
  Filled 2020-05-21: qty 3

## 2020-05-21 MED ORDER — MORPHINE SULFATE (PF) 2 MG/ML IV SOLN
1.0000 mg | INTRAVENOUS | Status: DC | PRN
  Administered 2020-05-21 – 2020-05-23 (×5): 1 mg via INTRAVENOUS
  Filled 2020-05-21 (×5): qty 1

## 2020-05-21 MED ORDER — DONEPEZIL HCL 5 MG PO TABS
5.0000 mg | ORAL_TABLET | Freq: Every day | ORAL | Status: DC
  Administered 2020-05-21 – 2020-05-22 (×2): 5 mg via ORAL
  Filled 2020-05-21 (×3): qty 1

## 2020-05-21 MED ORDER — AMLODIPINE BESYLATE 10 MG PO TABS
10.0000 mg | ORAL_TABLET | Freq: Every day | ORAL | Status: DC
  Administered 2020-05-21 – 2020-05-23 (×3): 10 mg via ORAL
  Filled 2020-05-21: qty 1
  Filled 2020-05-21: qty 2
  Filled 2020-05-21: qty 1

## 2020-05-21 MED ORDER — METHYLPREDNISOLONE SODIUM SUCC 125 MG IJ SOLR
125.0000 mg | Freq: Once | INTRAMUSCULAR | Status: AC
  Administered 2020-05-21: 125 mg via INTRAVENOUS
  Filled 2020-05-21: qty 2

## 2020-05-21 MED ORDER — ASPIRIN EC 81 MG PO TBEC
81.0000 mg | DELAYED_RELEASE_TABLET | Freq: Every day | ORAL | Status: DC
  Administered 2020-05-21 – 2020-05-23 (×3): 81 mg via ORAL
  Filled 2020-05-21 (×3): qty 1

## 2020-05-21 MED ORDER — TRAZODONE HCL 100 MG PO TABS
100.0000 mg | ORAL_TABLET | Freq: Every day | ORAL | Status: DC
  Administered 2020-05-21 – 2020-05-22 (×2): 100 mg via ORAL
  Filled 2020-05-21 (×2): qty 1

## 2020-05-21 MED ORDER — METHYLPREDNISOLONE SODIUM SUCC 40 MG IJ SOLR
40.0000 mg | Freq: Two times a day (BID) | INTRAMUSCULAR | Status: DC
  Administered 2020-05-21 – 2020-05-23 (×5): 40 mg via INTRAVENOUS
  Filled 2020-05-21 (×5): qty 1

## 2020-05-21 MED ORDER — HYDRALAZINE HCL 20 MG/ML IJ SOLN
5.0000 mg | INTRAMUSCULAR | Status: DC | PRN
  Administered 2020-05-21 (×2): 5 mg via INTRAVENOUS
  Filled 2020-05-21 (×2): qty 1

## 2020-05-21 MED ORDER — LACTATED RINGERS IV BOLUS
500.0000 mL | Freq: Once | INTRAVENOUS | Status: AC
  Administered 2020-05-21: 500 mL via INTRAVENOUS

## 2020-05-21 MED ORDER — SODIUM CHLORIDE 0.9 % IV SOLN
1.0000 g | Freq: Once | INTRAVENOUS | Status: AC
  Administered 2020-05-21: 1 g via INTRAVENOUS
  Filled 2020-05-21: qty 10

## 2020-05-21 MED ORDER — ACETAMINOPHEN 325 MG PO TABS
650.0000 mg | ORAL_TABLET | Freq: Four times a day (QID) | ORAL | Status: DC | PRN
  Filled 2020-05-21: qty 2

## 2020-05-21 MED ORDER — IPRATROPIUM-ALBUTEROL 0.5-2.5 (3) MG/3ML IN SOLN
3.0000 mL | RESPIRATORY_TRACT | Status: DC
  Administered 2020-05-21 – 2020-05-22 (×7): 3 mL via RESPIRATORY_TRACT
  Filled 2020-05-21 (×8): qty 3

## 2020-05-21 NOTE — ED Notes (Signed)
Pt arrives from group home in acute respiratory distress, gasping for air, tachypneic, unable to speak.  ems reports pt uncooperative sats 81%, duoneb x2 given. Hx copd, not usually on oxygen.

## 2020-05-21 NOTE — ED Provider Notes (Signed)
[facility] Emergency Department Provider Note  ____________________________________________   Event Date/Time   First MD Initiated Contact with Patient 05/21/20 [phone]     (approximate)  I have reviewed the triage vital signs and the nursing notes.   HISTORY  Chief Complaint Respiratory Distress   HPI Carlos Nelson is a 70 y.o. male with medical history significant forCOPD, HTN and schizophrenia most recently hospitalized 4/25-4/26 for COPD exacerbation who presents via EMS from his group home for assessment of acute onset of shortness of breath and chest tightness.  History is extremely limited from the patient secondary to severe respiratory distress and tachypnea.  He endorses a little bit of chest tightness but is unable to provide any additional information on arrival secondary to respiratory distress.  Per EMS he was in severe distress on their arrival and they did give him blow-by oxygen but no medications administered prior to arrival patient not tolerating nonrebreather in route although he was hypoxic with SPO2 in the 50s and 60s.         Past Medical History:  Diagnosis Date  . Chronic respiratory failure with hypoxia ([place])   . COPD (chronic obstructive pulmonary disease) ([place])   . Hypertension   . Schizophrenia [facility])     Patient Active Problem List   Diagnosis Date Noted  . Protein-calorie malnutrition, severe 03/18/2020  . COPD with acute exacerbation ([place]) 03/10/2020  . Right-sided chest pain 03/10/2020  . HLD (hyperlipidemia) 01/18/2020  . CAD (coronary artery disease) 01/18/2020  . Hypertensive urgency 01/18/2020  . Tobacco abuse 01/18/2020  . Scrotum pain   . Transaminitis   . Demand ischemia ([place]) 06/11/2019  . Malnutrition of moderate degree 06/10/2019  . NSTEMI (non-ST elevated myocardial infarction) ([place]) 06/09/2019  . Depression with anxiety 06/09/2019  . Pleuritic chest pain 05/20/2019  . CAP (community acquired  pneumonia) 05/20/2019  . Hypertension   . COPD exacerbation ([place])   . Acute respiratory failure with hypoxia ([place])   . Altered mental status 09/25/2018  . Schizophrenia ([place])   . Tachycardia 08/21/2018  . Schizophrenia, chronic with acute exacerbation ([place]) 08/17/2018  . Elevated PSA 05/24/2015    Past Surgical History:  Procedure Laterality Date  . COLONOSCOPY    . COLONOSCOPY WITH PROPOFOL N/A 12/13/2016   Procedure: COLONOSCOPY WITH PROPOFOL;  Surgeon: Lollie Sails, MD;  Location: [facility] ENDOSCOPY;  Service: Endoscopy;  Laterality: N/A;  . ESOPHAGOGASTRODUODENOSCOPY (EGD) WITH PROPOFOL N/A 12/13/2016   Procedure: ESOPHAGOGASTRODUODENOSCOPY (EGD) WITH PROPOFOL;  Surgeon: Lollie Sails, MD;  Location: [facility] ENDOSCOPY;  Service: Endoscopy;  Laterality: N/A;  . left arm surgery      Prior to Admission medications   Medication Sig Start Date End Date Taking? Authorizing Provider  albuterol (VENTOLIN HFA) 108 (90 Base) MCG/ACT inhaler Inhale 2 by mouth every 4 hours as needed for wheezing, cough, and/or shortness of breath 04/23/20   Nolberto Hanlon, MD  amlodipine-olmesartan (AZOR) 10-20 MG tablet Take 1 tablet by mouth daily. 03/23/20   Loel Dubonnet, NP  ARIPiprazole (ABILIFY) 30 MG tablet Take 30 mg by mouth daily. 01/29/20   [provider]  donepezil (ARICEPT) 5 MG tablet Take 5 mg by mouth at bedtime.    [provider]  ipratropium-albuterol (DUONEB) 0.5-2.5 (3) MG/3ML SOLN Take 3 mLs by nebulization every 4 (four) hours for 14 days. Patient not taking: Reported on 05/09/2020 03/11/20 03/25/20  Fritzi Mandes, MD  pantoprazole (PROTONIX) 20 MG tablet Take 1 tablet (20 mg total)  by mouth daily. 12/18/19 12/17/20  Lavonia Drafts, MD  predniSONE (DELTASONE) 10 MG tablet Take 4 tablets (40 mg total) by mouth daily for 3 days, THEN 3 tablets (30 mg total) daily for 3 days, THEN 2 tablets (20 mg total) daily for 3 days, THEN 1 tablet (10 mg total) daily for 3 days. 05/10/20  05/22/20  Little Ishikawa, MD  QUEtiapine (SEROQUEL) 200 MG tablet Take 3 tablets (600 mg total) by mouth at bedtime. Patient taking differently: Take 200 mg by mouth at bedtime. 02/03/20   Harvest Dark, MD  QUEtiapine (SEROQUEL) 25 MG tablet Take 25 mg by mouth daily. 02/25/20   [provider]  traZODone (DESYREL) 100 MG tablet Take 100 mg by mouth at bedtime. 02/24/20   [provider]    Allergies Ace inhibitors  Family History  Problem Relation Age of Onset  . Prostate cancer Neg Hx   . Bladder Cancer Neg Hx     Social History Social History   Tobacco Use  . Smoking status: Current Every Day Smoker    Packs/day: 1.00    Types: Cigarettes  . Smokeless tobacco: Never Used  Substance Use Topics  . Alcohol use: Yes    Alcohol/week: 0.0 standard drinks    Comment: beer  . Drug use: Not Currently    Types: Marijuana    Review of Systems  Review of Systems  Unable to perform ROS: Severe respiratory distress  Respiratory: Positive for shortness of breath.   Cardiovascular: Positive for chest pain.      ____________________________________________   PHYSICAL EXAM:  VITAL SIGNS: ED Triage Vitals [05/21/20 0607]  Enc Vitals Group     BP (!) 194/115     Pulse Rate (!) 110     Resp (!) 32     Temp      Temp src      SpO2 100 %     Weight      Height      Head Circumference      Peak Flow      Pain Score      Pain Loc      Pain Edu?      Excl. in [place]?    Vitals:   05/21/20 0703 05/21/20 0725  BP: (!) 156/98   Pulse: (!) 108   Resp: (!) 27   Temp:  97.8 F (36.6 C)  SpO2: 100% 92%   Physical Exam Vitals and nursing note reviewed.  Constitutional:      General: He is in acute distress.     Appearance: He is well-developed. He is ill-appearing.  HENT:     Head: Normocephalic and atraumatic.     Right Ear: External ear normal.     Nose: Nose normal.  Eyes:     Conjunctiva/sclera: Conjunctivae normal.  Cardiovascular:      Rate and Rhythm: Regular rhythm. Tachycardia present.     Heart sounds: No murmur heard.   Pulmonary:     Effort: Tachypnea, accessory muscle usage, respiratory distress and retractions present.     Breath sounds: Decreased breath sounds and wheezing present.  Abdominal:     Palpations: Abdomen is soft.     Tenderness: There is no abdominal tenderness.  Musculoskeletal:     Cervical back: Neck supple.  Skin:    General: Skin is warm and dry.     Capillary Refill: Capillary refill takes less than 2 seconds.  Neurological:     General: No focal deficit  present.     Mental Status: He is alert.  Psychiatric:        Mood and Affect: Mood is anxious.     Patient is able to follow commands in extremities.  ____________________________________________   LABS (all labs ordered are listed, but only abnormal results are displayed)  Labs Reviewed  BLOOD GAS, VENOUS - Abnormal; Notable for the following components:      Result Value   pH, Ven 7.21 (*)    pCO2, Ven 92 (*)    Bicarbonate 36.8 (*)    Acid-Base Excess 5.0 (*)    All other components within normal limits  CBC WITH DIFFERENTIAL/PLATELET - Abnormal; Notable for the following components:   WBC 17.7 (*)    Neutro Abs 10.9 (*)    Lymphs Abs 5.1 (*)    Monocytes Absolute 1.3 (*)    Abs Immature Granulocytes 0.17 (*)    All other components within normal limits  COMPREHENSIVE METABOLIC PANEL - Abnormal; Notable for the following components:   Glucose, Bld 121 (*)    AST 43 (*)    ALT 45 (*)    All other components within normal limits  RESP PANEL BY RT-PCR (FLU A&B, COVID) ARPGX2  CULTURE, BLOOD (SINGLE)  MAGNESIUM  LACTIC ACID, PLASMA  PROTIME-INR  APTT  LACTIC ACID, PLASMA  BLOOD GAS, VENOUS  PROCALCITONIN  TROPONIN I (HIGH SENSITIVITY)   ____________________________________________  EKG  Sinus tachycardia with ventricular rate of 111 with right axis deviation, unremarkable intervals and nonspecific ST change  in V2 without other clearance of acute ischemia or other significant underlying arrhythmia.  ____________________________________________  RADIOLOGY  ED MD interpretation: Emphysematous changes without overt edema, large effusion, full consolidation, pneumothorax or other clear acute intrathoracic process.   Official radiology report(s): DG Chest Portable 1 View  Result Date: 05/21/2020 CLINICAL DATA:  Shortness of breath. EXAM: PORTABLE CHEST 1 VIEW COMPARISON:  Chest x-ray 05/09/2020, CT chest 10/15/2019 FINDINGS: The heart size and mediastinal contours are unchanged. Aortic arch calcifications. Hyperinflation consistent with emphysematous changes. No focal consolidation. No pulmonary edema. Persistent blunting of bilateral costophrenic angles with no definite pleural effusion. No pneumothorax. No acute osseous abnormality. IMPRESSION: No active disease. Electronically Signed   By: Iven Finn M.D.   On: 05/21/2020 06:33    ____________________________________________   PROCEDURES  Procedure(s) performed (including Critical Care):  .Critical Care Performed by: Lucrezia Starch, MD Authorized by: Lucrezia Starch, MD   Critical care provider statement:    Critical care time (minutes):  45   Critical care time was exclusive of:  Separately billable procedures and treating other patients   Critical care was necessary to treat or prevent imminent or life-threatening deterioration of the following conditions:  Respiratory failure   Critical care was time spent personally by me on the following activities:  Discussions with consultants, evaluation of patient's response to treatment, examination of patient, ordering and performing treatments and interventions, ordering and review of laboratory studies, ordering and review of radiographic studies, pulse oximetry, re-evaluation of patient's condition, obtaining history from patient or surrogate and review of old  charts     ____________________________________________   INITIAL IMPRESSION / [place] / ED COURSE      Patient presents with above-stated history exam for assessment of acute onset of shortness of breath associate with chest tightness.  On arrival to the emergency room he is hypoxic with SPO2 of 50% tachycardic and tachypneic with evidence of significant accessory muscle use.  Patient  immediately placed on nonrebreather with nasal cannula underneath with improvement in SPO2 to 100%.  He was subsequently transitioned to BiPAP.  Was immediately given a DuoNeb's, Solu-Medrol and sublingual nitroglycerin for his chest tightness and hypertension.  Differential includes acute COPD exacerbation, pneumothorax, heart failure, ACS, arrhythmia, metabolic derangements, pneumonia, anemia and PE.  CXR remarkable for Emphysematous changes without overt edema, large effusion, full consolidation, pneumothorax or other clear acute intrathoracic process.   EKG with some nonspecific findings but no significant arrhythmia.  Troponin is nonelevated and overall Evalose suspicion for ACS or myocarditis although we will plan to obtain a 2-hour repeat.  CBC with a WBC count of 17.7 with a normal hemoglobin.  Given tachycardia and tachypnea concern for possible sepsis.  Will obtain blood culture and treat patient with broad-spectrum antibiotics and give a fluid bolus.  VBG with a pH of 7.21 with a PCO2 of 92 and a bicarb of 36.8 consistent with acute chronic respiratory acidosis.  CMP has no significant electrolyte or metabolic derangements.  Magnesium is WNL.  Lactic acid is 1.5.  INR is unremarkable.  Impression is acute hypoxic and hypercarbic respiratory failure secondary to COPD exacerbation.  We will plan to admit to medicine service for further evaluation management.   ____________________________________________   FINAL CLINICAL IMPRESSION(S) / ED DIAGNOSES  Final diagnoses:  Acute  respiratory failure with hypoxia and hypercapnia (HCC)  COPD exacerbation (HCC)    Medications  nitroGLYCERIN (NITROSTAT) SL tablet 0.4 mg (0.4 mg Sublingual Given 05/21/20 0614)  azithromycin (ZITHROMAX) 500 mg in sodium chloride 0.9 % 250 mL IVPB (500 mg Intravenous New Bag/Given 05/21/20 0654)  methylPREDNISolone sodium succinate (SOLU-MEDROL) 125 mg/2 mL injection 125 mg (125 mg Intravenous Given 05/21/20 0610)  ipratropium-albuterol (DUONEB) 0.5-2.5 (3) MG/3ML nebulizer solution 9 mL (9 mLs Nebulization Given 05/21/20 0623)  cefTRIAXone (ROCEPHIN) 1 g in sodium chloride 0.9 % 100 mL IVPB (0 g Intravenous Stopped 05/21/20 0725)  lactated ringers bolus 500 mL (500 mLs Intravenous New Bag/Given 05/21/20 0654)  ondansetron (ZOFRAN) injection 4 mg (4 mg Intravenous Given 05/21/20 0700)     ED Discharge Orders    None       Note:  This document was prepared using Dragon voice recognition software and may include unintentional dictation errors.   Lucrezia Starch, MD 05/21/20 [phone]

## 2020-05-21 NOTE — ED Notes (Signed)
Lactic, blue, green, lav, covid sent to lab

## 2020-05-21 NOTE — ED Notes (Signed)
Called lab to ask again to run troponin.

## 2020-05-21 NOTE — H&P (Signed)
History and Physical    Carlos Nelson [id number] DOB: [birth year] DOA: 05/21/2020  Referring MD/NP/PA:   PCP: [facility]   Patient coming from:  The patient is coming from group home.    Chief Complaint: SOB  HPI: Carlos Nelson is a 70 y.o. male with medical history significant of COPD, hypertension, hyperlipidemia, GERD, depression, anxiety, schizophrenia, CAD, non-STEMI, tobacco abuse, who presents with shortness of breath.  Patient was recently hospitalized from 4/25-4/26 due to COPD exacerbation.  Patient was discharged on 2 L oxygen and prednisone taper.  Per report, patient was noted to have severe respiratory distress in the facility, and sent to the emergency room for further evaluation and treatment.  Patient was gasping for air, could not speak in full sentence.  Patient was found to have oxygen down to 51% on room air, BiPAP started.  Patient has dry cough, no fever or chills.  He reports chest pain, which is located in the central chest, constant, moderate, nonradiating.  Patient could not further characterize if the chest pain is pleuritic or dull. Patient has nausea, no vomiting, diarrhea or abdominal pain.  Denies symptoms of UTI.  ED Course: pt was found to have WBC 17.7, negative COVID PCR, troponin level 17, electrolytes renal function okay, temperature normal, elevated blood pressure 194/150, 156/98, heart rate 126, 108, RR 40, chest x-ray negative for infiltration.  VBG with pH 7.21, CO2 92, O2 33. Patient is admitted to progressive bed as inpatient.  Review of Systems:   General: no fevers, chills, no body weight gain, has fatigue HEENT: no blurry vision, hearing changes or sore throat Respiratory: has dyspnea, coughing, wheezing CV: has chest pain, no palpitations GI: has nausea, no vomiting, abdominal pain, diarrhea, constipation GU: no dysuria, burning on urination, increased urinary frequency, hematuria  Ext: no leg edema Neuro: no  unilateral weakness, numbness, or tingling, no vision change or hearing loss Skin: no rash, no skin tear. MSK: No muscle spasm, no deformity, no limitation of range of movement in spin Heme: No easy bruising.  Travel history: No recent long distant travel.  Allergy:  Allergies  Allergen Reactions  . Ace Inhibitors Swelling    Ok with benazapril, per grouphome    Past Medical History:  Diagnosis Date  . Chronic respiratory failure with hypoxia ([place])   . COPD (chronic obstructive pulmonary disease) ([place])   . Hypertension   . Schizophrenia [facility])     Past Surgical History:  Procedure Laterality Date  . COLONOSCOPY    . COLONOSCOPY WITH PROPOFOL N/A 12/13/2016   Procedure: COLONOSCOPY WITH PROPOFOL;  Surgeon: Lollie Sails, MD;  Location: [facility] ENDOSCOPY;  Service: Endoscopy;  Laterality: N/A;  . ESOPHAGOGASTRODUODENOSCOPY (EGD) WITH PROPOFOL N/A 12/13/2016   Procedure: ESOPHAGOGASTRODUODENOSCOPY (EGD) WITH PROPOFOL;  Surgeon: Lollie Sails, MD;  Location: [facility] ENDOSCOPY;  Service: Endoscopy;  Laterality: N/A;  . left arm surgery      Social History:  reports that he has been smoking cigarettes. He has been smoking about 1.00 pack per day. He has never used smokeless tobacco. He reports current alcohol use. He reports previous drug use. Drug: Marijuana.  Family History:  Family History  Problem Relation Age of Onset  . Prostate cancer Neg Hx   . Bladder Cancer Neg Hx      Prior to Admission medications   Medication Sig Start Date End Date Taking? Authorizing Provider  albuterol (VENTOLIN HFA) 108 (90 Base) MCG/ACT inhaler Inhale 2 by mouth every 4  hours as needed for wheezing, cough, and/or shortness of breath 04/23/20   Nolberto Hanlon, MD  amlodipine-olmesartan (AZOR) 10-20 MG tablet Take 1 tablet by mouth daily. 03/23/20   Loel Dubonnet, NP  ARIPiprazole (ABILIFY) 30 MG tablet Take 30 mg by mouth daily. 01/29/20   [provider]  donepezil (ARICEPT) 5 MG  tablet Take 5 mg by mouth at bedtime.    [provider]  ipratropium-albuterol (DUONEB) 0.5-2.5 (3) MG/3ML SOLN Take 3 mLs by nebulization every 4 (four) hours for 14 days. Patient not taking: Reported on 05/09/2020 03/11/20 03/25/20  Fritzi Mandes, MD  pantoprazole (PROTONIX) 20 MG tablet Take 1 tablet (20 mg total) by mouth daily. 12/18/19 12/17/20  Lavonia Drafts, MD  predniSONE (DELTASONE) 10 MG tablet Take 4 tablets (40 mg total) by mouth daily for 3 days, THEN 3 tablets (30 mg total) daily for 3 days, THEN 2 tablets (20 mg total) daily for 3 days, THEN 1 tablet (10 mg total) daily for 3 days. 05/10/20 05/22/20  Little Ishikawa, MD  QUEtiapine (SEROQUEL) 200 MG tablet Take 3 tablets (600 mg total) by mouth at bedtime. Patient taking differently: Take 200 mg by mouth at bedtime. 02/03/20   Harvest Dark, MD  QUEtiapine (SEROQUEL) 25 MG tablet Take 25 mg by mouth daily. 02/25/20   [provider]  traZODone (DESYREL) 100 MG tablet Take 100 mg by mouth at bedtime. 02/24/20   [provider]    Physical Exam: Vitals:   05/21/20 0643 05/21/20 0703 05/21/20 0725 05/21/20 0730  BP: (!) 159/104 (!) 156/98  (!) 146/96  Pulse: (!) 105 (!) 108  (!) 108  Resp: (!) 25 (!) 27  18  Temp:   97.8 F (36.6 C)   TempSrc:   Oral   SpO2: 100% 100% 92% 99%  Weight:      Height:       General: Not in acute distress HEENT:       Eyes: PERRL, EOMI, no scleral icterus.       ENT: No discharge from the ears and nose, no pharynx injection, no tonsillar enlargement.        Neck: No JVD, no bruit, no mass felt. Heme: No neck lymph node enlargement. Cardiac: S1/S2, RRR, No murmurs, No gallops or rubs. Respiratory: Severely decreased air movement bilaterally, has mild wheezing bilaterally GI: Soft, nondistended, nontender, no rebound pain, no organomegaly, BS present. GU: No hematuria Ext: No pitting leg edema bilaterally. 1+DP/PT pulse bilaterally. Musculoskeletal: No joint  deformities, No joint redness or warmth, no limitation of ROM in spin. Skin: No rashes.  Neuro: Alert, oriented X3, cranial nerves II-XII grossly intact, moves all extremities normally. Psych: Patient is not psychotic, no suicidal or hemocidal ideation.  Labs on Admission: I have personally reviewed following labs and imaging studies  CBC: Recent Labs  Lab 05/21/20 0610  WBC 17.7*  NEUTROABS 10.9*  HGB 15.2  HCT 47.0  MCV 94.0  PLT 419   Basic Metabolic Panel: Recent Labs  Lab 05/21/20 0610  NA 139  K 4.1  CL 103  CO2 27  GLUCOSE 121*  BUN 11  CREATININE 1.05  CALCIUM 9.2  MG 1.9   GFR: Estimated Creatinine Clearance: 54 mL/min (by C-G formula based on SCr of 1.05 mg/dL). Liver Function Tests: Recent Labs  Lab 05/21/20 0610  AST 43*  ALT 45*  ALKPHOS 82  BILITOT 0.9  PROT 7.0  ALBUMIN 4.0   No results for input(s): LIPASE, AMYLASE in  the last 168 hours. No results for input(s): AMMONIA in the last 168 hours. Coagulation Profile: Recent Labs  Lab 05/21/20 0628  INR 0.9   Cardiac Enzymes: No results for input(s): CKTOTAL, CKMB, CKMBINDEX, TROPONINI in the last 168 hours. BNP (last 3 results) No results for input(s): PROBNP in the last 8760 hours. HbA1C: No results for input(s): HGBA1C in the last 72 hours. CBG: No results for input(s): GLUCAP in the last 168 hours. Lipid Profile: No results for input(s): CHOL, HDL, LDLCALC, TRIG, CHOLHDL, LDLDIRECT in the last 72 hours. Thyroid Function Tests: No results for input(s): TSH, T4TOTAL, FREET4, T3FREE, THYROIDAB in the last 72 hours. Anemia Panel: No results for input(s): VITAMINB12, FOLATE, FERRITIN, TIBC, IRON, RETICCTPCT in the last 72 hours. Urine analysis:    Component Value Date/Time   COLORURINE YELLOW (A) 05/09/2020 0525   APPEARANCEUR CLEAR (A) 05/09/2020 0525   APPEARANCEUR Clear 09/04/2019 1429   LABSPEC 1.016 05/09/2020 0525   LABSPEC 1.014 11/09/2013 1525   PHURINE 5.0 05/09/2020 0525    GLUCOSEU NEGATIVE 05/09/2020 0525   GLUCOSEU Negative 11/09/2013 1525   HGBUR NEGATIVE 05/09/2020 0525   BILIRUBINUR NEGATIVE 05/09/2020 0525   BILIRUBINUR Negative 09/04/2019 1429   BILIRUBINUR Negative 11/09/2013 1525   KETONESUR NEGATIVE 05/09/2020 0525   PROTEINUR NEGATIVE 05/09/2020 0525   NITRITE NEGATIVE 05/09/2020 0525   LEUKOCYTESUR NEGATIVE 05/09/2020 0525   LEUKOCYTESUR Negative 11/09/2013 1525   Sepsis Labs: @LABRCNTIP (procalcitonin:4,lacticidven:4) ) Recent Results (from the past 240 hour(s))  Resp Panel by RT-PCR (Flu A&B, Covid) Nasopharyngeal Swab     Status: None   Collection Time: 05/21/20  6:11 AM   Specimen: Nasopharyngeal Swab; Nasopharyngeal(NP) swabs in vial transport medium  Result Value Ref Range Status   SARS Coronavirus 2 by RT PCR NEGATIVE NEGATIVE Final    Comment: (NOTE) SARS-CoV-2 target nucleic acids are NOT DETECTED.  The SARS-CoV-2 RNA is generally detectable in upper respiratory specimens during the acute phase of infection. The lowest concentration of SARS-CoV-2 viral copies this assay can detect is 138 copies/mL. A negative result does not preclude SARS-Cov-2 infection and should not be used as the sole basis for treatment or other patient management decisions. A negative result may occur with  improper specimen collection/handling, submission of specimen other than nasopharyngeal swab, presence of viral mutation(s) within the areas targeted by this assay, and inadequate number of viral copies(<138 copies/mL). A negative result must be combined with clinical observations, patient history, and epidemiological information. The expected result is Negative.  Fact Sheet for Patients:  EntrepreneurPulse.com.au  Fact Sheet for Healthcare Providers:  IncredibleEmployment.be  This test is no t yet approved or cleared by the [place] FDA and  has been authorized for detection and/or diagnosis of  SARS-CoV-2 by FDA under an Emergency Use Authorization (EUA). This EUA will remain  in effect (meaning this test can be used) for the duration of the COVID-19 declaration under Section 564(b)(1) of the Act, 21 U.S.C.section 360bbb-3(b)(1), unless the authorization is terminated  or revoked sooner.       Influenza A by PCR NEGATIVE NEGATIVE Final   Influenza B by PCR NEGATIVE NEGATIVE Final    Comment: (NOTE) The Xpert Xpress SARS-CoV-2/FLU/RSV plus assay is intended as an aid in the diagnosis of influenza from Nasopharyngeal swab specimens and should not be used as a sole basis for treatment. Nasal washings and aspirates are unacceptable for Xpert Xpress SARS-CoV-2/FLU/RSV testing.  Fact Sheet for Patients: EntrepreneurPulse.com.au  Fact Sheet for Healthcare Providers: IncredibleEmployment.be  This  test is not yet approved or cleared by the [place] and has been authorized for detection and/or diagnosis of SARS-CoV-2 by FDA under an Emergency Use Authorization (EUA). This EUA will remain in effect (meaning this test can be used) for the duration of the COVID-19 declaration under Section 564(b)(1) of the Act, 21 U.S.C. section 360bbb-3(b)(1), unless the authorization is terminated or revoked.  Performed at [facility], [address] [place], [place] [address]      Radiological Exams on Admission: DG Chest Portable 1 View  Result Date: 05/21/2020 CLINICAL DATA:  Shortness of breath. EXAM: PORTABLE CHEST 1 VIEW COMPARISON:  Chest x-ray 05/09/2020, CT chest 10/15/2019 FINDINGS: The heart size and mediastinal contours are unchanged. Aortic arch calcifications. Hyperinflation consistent with emphysematous changes. No focal consolidation. No pulmonary edema. Persistent blunting of bilateral costophrenic angles with no definite pleural effusion. No pneumothorax. No acute osseous abnormality. IMPRESSION: No active disease.  Electronically Signed   By: Iven Finn M.D.   On: 05/21/2020 06:33     EKG: I have personally reviewed.  Sinus tachycardia, QTC 457, RAD, right atrial enlargement  Assessment/Plan Principal Problem:   COPD exacerbation (HCC) Active Problems:   Schizophrenia (HCC)   Hypertension   Acute on chronic respiratory failure with hypoxia and hypercapnia (HCC)   Depression with anxiety   CAD (coronary artery disease)   Hypertensive urgency   Tobacco abuse   Sepsis ([place])   Chest pain   Acute on chronic respiratory failure with hypoxia and hypercapnia and sepsis due to COPD exacerbation [facility]): Patient meets criteria for sepsis with leukocytosis with WBC 17.7, tachycardia with heart rate of 126, tachypnea with RR 40.  Lactic acid is normal 1.5.  Currently hemodynamically stable  - will admit to progressive unit as inpatient - prn BPIAP -Bronchodilators -Solu-Medrol 40 mg IV bid -Doxycycline 100 mg twice daily (patient received 1 dose of IV Rocephin and azithromycin in ED) -Mucinex for cough  -Incentive spirometry -Follow up blood culture x2, sputum culture -Nasal cannula oxygen as needed to maintain O2 saturation 93% or greater -will get Procalcitonin  -IVF: 500 mL of NS bolus in ED  Hypertensive urgency and hx of HTN: Bp 194/115 -->156/98. Pt was supposed to take Azor, but he refused to take it recently. -As needed IV hydralazine -started amlodipine 10 mg daily  Schizophrenia and depression with anxiety: -Abilify, Seroquel  CAD (coronary artery disease) and chest pain: Likely due to demand ischemia.  But needs to rule out PE. -Trend troponin - Aspirin 81 mg daily - As needed nitroglycerin, morphine -Trend troponin -Check A1c, FLP -Check D-dimer, if D-dimer is positive, will get CT angiogram to rule out PE  Tobacco abuse -Nicotine patch    DVT ppx: SQ Lovenox Code Status: Full code Family Communication: not done, no family member is at bed side.  I have tried to call  her sister [name] without success. Disposition Plan:  Anticipate discharge back to previous environment Consults called:  none Admission status and Level of care: Progressive Cardiac:     as inpt    Status is: Inpatient  Remains inpatient appropriate because:Inpatient level of care appropriate due to severity of illness   Dispo: The patient is from: Group home              Anticipated d/c is to: Group home              Patient currently is not medically stable to d/c.   Difficult to place  patient No         Date of Service 05/21/2020    Ivor Costa [facility]   If 7PM-7AM, please contact night-coverage [email or web address] 05/21/2020, 8:14 AM

## 2020-05-21 NOTE — ED Notes (Signed)
Patient reports he would like to go back on Bipap, patient oxygen 94%, RR 36. Placed back on bipap. Patient toelerating well.

## 2020-05-21 NOTE — ED Notes (Signed)
Messaged RN bed assigned 

## 2020-05-21 NOTE — ED Notes (Signed)
Dr. Blaine Hamper notified of BP post hydralazine.

## 2020-05-21 NOTE — ED Notes (Signed)
Patient reports increased labored breathing, ED doctor walking by and responded to patient bipap alarming, recommended additional duoneb for chest tightness, oxygen 100% on bipap.

## 2020-05-21 NOTE — ED Notes (Signed)
Pt provided water and ginger ale per request.

## 2020-05-21 NOTE — ED Notes (Signed)
Per Dr. Blaine Hamper, give additional 5mg  hydralazine now.

## 2020-05-21 NOTE — ED Notes (Signed)
Pt attempting to vomit and removed bipap mask himself. Placed on 2 L [place] in meantime. Pt observed spitting up by this RN.

## 2020-05-21 NOTE — ED Notes (Signed)
Respiratory therapy bedside placing on bipap and giving duoneb

## 2020-05-21 NOTE — ED Notes (Signed)
Called lab to run troponin  

## 2020-05-21 NOTE — ED Notes (Signed)
Additional lactic, vbg, blood culture x1 sent to lab

## 2020-05-22 DIAGNOSIS — J9602 Acute respiratory failure with hypercapnia: Secondary | ICD-10-CM

## 2020-05-22 DIAGNOSIS — J9601 Acute respiratory failure with hypoxia: Secondary | ICD-10-CM | POA: Diagnosis not present

## 2020-05-22 DIAGNOSIS — J9622 Acute and chronic respiratory failure with hypercapnia: Secondary | ICD-10-CM

## 2020-05-22 DIAGNOSIS — F418 Other specified anxiety disorders: Secondary | ICD-10-CM | POA: Diagnosis not present

## 2020-05-22 DIAGNOSIS — J9621 Acute and chronic respiratory failure with hypoxia: Secondary | ICD-10-CM | POA: Diagnosis not present

## 2020-05-22 DIAGNOSIS — R071 Chest pain on breathing: Secondary | ICD-10-CM | POA: Diagnosis not present

## 2020-05-22 DIAGNOSIS — I10 Essential (primary) hypertension: Secondary | ICD-10-CM

## 2020-05-22 LAB — BASIC METABOLIC PANEL
Anion gap: 9 (ref 5–15)
BUN: 17 mg/dL (ref 8–23)
CO2: 28 mmol/L (ref 22–32)
Calcium: 9.4 mg/dL (ref 8.9–10.3)
Chloride: 102 mmol/L (ref 98–111)
Creatinine, Ser: 0.87 mg/dL (ref 0.61–1.24)
GFR, Estimated: >60 mL/min (ref >60)
Glucose, Bld: 119 mg/dL — ABNORMAL HIGH (ref 70–99)
Potassium: 3.7 mmol/L (ref 3.5–5.1)
Sodium: 139 mmol/L (ref 135–145)

## 2020-05-22 LAB — LIPID PANEL
Cholesterol: 172 mg/dL (ref 0–200)
HDL: 90 mg/dL (ref >40)
LDL Cholesterol: 62 mg/dL (ref 0–99)
Total CHOL/HDL Ratio: 1.9 RATIO
Triglycerides: 102 mg/dL (ref <150)
VLDL: 20 mg/dL (ref 0–40)

## 2020-05-22 LAB — BRAIN NATRIURETIC PEPTIDE: B Natriuretic Peptide: 85.8 pg/mL (ref 0.0–100.0)

## 2020-05-22 LAB — CBC
HCT: 45.8 % (ref 39.0–52.0)
Hemoglobin: 14.8 g/dL (ref 13.0–17.0)
MCH: 29.7 pg (ref 26.0–34.0)
MCHC: 32.3 g/dL (ref 30.0–36.0)
MCV: 91.8 fL (ref 80.0–100.0)
Platelets: 249 10*3/uL (ref 150–400)
RBC: 4.99 MIL/uL (ref 4.22–5.81)
RDW: 13.5 % (ref 11.5–15.5)
WBC: 17.1 10*3/uL — ABNORMAL HIGH (ref 4.0–10.5)
nRBC: 0 % (ref 0.0–0.2)

## 2020-05-22 LAB — HEMOGLOBIN A1C
Hgb A1c MFr Bld: 5.9 % — ABNORMAL HIGH (ref 4.8–5.6)
Mean Plasma Glucose: 122.63 mg/dL

## 2020-05-22 MED ORDER — IRBESARTAN 150 MG PO TABS
150.0000 mg | ORAL_TABLET | Freq: Every day | ORAL | Status: DC
  Administered 2020-05-22 – 2020-05-23 (×2): 150 mg via ORAL
  Filled 2020-05-22 (×2): qty 1

## 2020-05-22 MED ORDER — POLYETHYLENE GLYCOL 3350 17 G PO PACK
17.0000 g | PACK | Freq: Once | ORAL | Status: AC
  Administered 2020-05-22: 17 g via ORAL
  Filled 2020-05-22: qty 1

## 2020-05-22 MED ORDER — MOMETASONE FURO-FORMOTEROL FUM 100-5 MCG/ACT IN AERO
2.0000 | INHALATION_SPRAY | Freq: Two times a day (BID) | RESPIRATORY_TRACT | Status: DC
  Administered 2020-05-22 – 2020-05-23 (×3): 2 via RESPIRATORY_TRACT
  Filled 2020-05-22: qty 8.8

## 2020-05-22 MED ORDER — DOCUSATE SODIUM 100 MG PO CAPS
100.0000 mg | ORAL_CAPSULE | Freq: Two times a day (BID) | ORAL | Status: DC
  Administered 2020-05-22 – 2020-05-23 (×3): 100 mg via ORAL
  Filled 2020-05-22 (×3): qty 1

## 2020-05-22 MED ORDER — IPRATROPIUM-ALBUTEROL 0.5-2.5 (3) MG/3ML IN SOLN
3.0000 mL | RESPIRATORY_TRACT | Status: DC | PRN
  Administered 2020-05-23: 3 mL via RESPIRATORY_TRACT
  Filled 2020-05-22: qty 3

## 2020-05-22 NOTE — Progress Notes (Signed)
Pt refuses bipap 

## 2020-05-22 NOTE — Progress Notes (Signed)
[facility]                                                                              Patient Demographics  Carlos Nelson, is a 70 y.o. male, DOB - Dec 26, 1950, XBD:[id number]  Admit date - 05/21/2020   Admitting Physician Ivor Costa, MD  Outpatient Primary MD for the patient is [place]  Outpatient specialists:   LOS - 1  days   Medical records reviewed and are as summarized below:    Chief Complaint  Patient presents with  . Respiratory Distress       Brief summary   Patient is a 70 year old male with history of COPD, hypertension, hyperlipidemia, GERD, depression/anxiety, schizophrenia, CAD ongoing tobacco abuse, medical noncompliance, supposed to be on O2 2 L via [place] (not using) presented with shortness of breath.  Patient was recently hospitalized and discharged on 4/26 due to COPD exacerbation.  He was discharged on 2 L O2 and prednisone taper.  Per patient, he has not been using the oxygen as he continues to smoke and would be a fire hazard.  At the time of admission he was in severe respiratory distress, gasping for air and could not speak in full sentences. O2 sats were down to 55% on room air and BiPAP was started.  He also reports chest tightness. Patient was admitted for acute respiratory failure with hypoxia secondary to COPD exacerbation.   Assessment & Plan    Principal Problem:   Acute on chronic respiratory failure with hypoxia and hypercapnia ([place]), sepsis secondary to COPD exacerbation -Patient met the criteria for sepsis with leukocytosis (could be due to prednisone), tachycardia heart rate 126, tachypnea with respiratory rate 40.  Lactic acid normal.  -Continue as needed BiPAP currently off -Continue IV Solu-Medrol doxycycline, scheduled nebs.  Added Dulera 2 puffs twice daily -Counseled strongly on tobacco cessation -Will also obtain 2D echo to assess any underlying cardiomyopathy/CHF -Procalcitonin less than  0.1  Active Problems: Tobacco use -Counseled strongly on tobacco cessation, smokes 8 cigarettes a day - patient has no intention of quitting  Coronary artery disease -Patient reported chest tightness could be due to COPD exacerbation, demand ischemia -Troponins mildly elevated, likely due to demand ischemia, trending down. -Follow 2D echo.  D dimer less than 0.27  Hypertensive urgency -Noncompliant with antihypertensives.  Supposed to be taking Azor (amlodipine-olmesartan) 10-20 mg daily.  BP still uncontrolled -Resumed Norvasc 10 mg daily, added Avapro -Continue hydralazine IV as needed with parameters    Schizophrenia ([place]), anxiety -Continue Abilify, Seroquel   Hypertension  GERD Continue PPI   Dementia -Continue Aricept   Code Status:  Full code DVT Prophylaxis:  enoxaparin (LOVENOX) injection 40 mg Start: 05/21/20 1000   Level of Care: Level of care: Progressive Cardiac Family Communication: Discussed all imaging results, lab results, explained to the patient    Disposition Plan:     Status is: Inpatient  Remains inpatient appropriate because:Hemodynamically unstable and Inpatient level of care appropriate due to severity of illness   Dispo: The patient is from: Home              Anticipated d/c  is to: Home              Patient currently is not medically stable to d/c.  Still wheezing, pending echo   Difficult to place patient No      Time Spent in minutes 35 minutes  Procedures:  None  Consultants:   None  Antimicrobials:   Anti-infectives (From admission, onward)   Start     Dose/Rate Route Frequency Ordered Stop   05/22/20 1000  doxycycline (VIBRA-TABS) tablet 100 mg        100 mg Oral Every 12 hours 05/21/20 0730     05/21/20 0645  cefTRIAXone (ROCEPHIN) 1 g in sodium chloride 0.9 % 100 mL IVPB        1 g 200 mL/hr over 30 Minutes Intravenous  Once 05/21/20 0638 05/21/20 0725   05/21/20 0645  azithromycin (ZITHROMAX) 500 mg in sodium  chloride 0.9 % 250 mL IVPB        500 mg 250 mL/hr over 60 Minutes Intravenous  Once 05/21/20 0638 05/21/20 0803         Medications  Scheduled Meds: . amLODipine  10 mg Oral Daily  . ARIPiprazole  30 mg Oral Daily  . aspirin EC  81 mg Oral Daily  . docusate sodium  100 mg Oral BID  . donepezil  5 mg Oral QHS  . doxycycline  100 mg Oral Q12H  . enoxaparin (LOVENOX) injection  40 mg Subcutaneous Q24H  . ipratropium-albuterol  3 mL Nebulization Q4H  . methylPREDNISolone (SOLU-MEDROL) injection  40 mg Intravenous Q12H  . mometasone-formoterol  2 puff Inhalation BID  . nicotine  21 mg Transdermal Daily  . pantoprazole  20 mg Oral Daily  . QUEtiapine  600 mg Oral QHS  . traZODone  100 mg Oral QHS   Continuous Infusions: PRN Meds:.acetaminophen, albuterol, dextromethorphan-guaiFENesin, hydrALAZINE, morphine injection, nitroGLYCERIN, ondansetron (ZOFRAN) IV      Subjective:   Carlos Nelson was seen and examined today.  States still feels winded and chest tightness.  Continues to smoke. Patient denies dizziness, abdominal pain, N/V/D/C, new weakness, numbess, tingling. No acute events overnight.  No fevers  Objective:   Vitals:   05/22/20 0034 05/22/20 0421 05/22/20 0807 05/22/20 0827  BP:  (!) 161/95  (!) 162/92  Pulse:  (!) 102 (!) 104 (!) 103  Resp:  18 18 20   Temp:  97.7 F (36.5 C)  98.1 F (36.7 C)  TempSrc:  Oral  Oral  SpO2: 96% 98% 93% 92%  Weight:      Height:        Intake/Output Summary (Last 24 hours) at 05/22/2020 1012 Last data filed at 05/22/2020 0500 Gross per 24 hour  Intake 720 ml  Output 4325 ml  Net -3605 ml     Wt Readings from Last 3 Encounters:  05/21/20 55.8 kg  05/09/20 60.5 kg  04/30/20 60 kg     Exam  General: Alert and oriented x 3, NAD  Cardiovascular: S1 S2 auscultated, no murmurs, RRR  Respiratory: Diminished breath sounds throughout with mild wheezing   Gastrointestinal: Soft, nontender, nondistended, + bowel  sounds  Ext: no pedal edema bilaterally  Neuro: no neuro deficits  Musculoskeletal: No digital cyanosis, clubbing  Skin: No rashes  Psych: Normal affect and demeanor, alert and oriented x3    Data Reviewed:  I have personally reviewed following labs and imaging studies  Micro Results Recent Results (from the past 240 hour(s))  Resp Panel by RT-PCR (Flu A&B,  Covid) Nasopharyngeal Swab     Status: None   Collection Time: 05/21/20  6:11 AM   Specimen: Nasopharyngeal Swab; Nasopharyngeal(NP) swabs in vial transport medium  Result Value Ref Range Status   SARS Coronavirus 2 by RT PCR NEGATIVE NEGATIVE Final    Comment: (NOTE) SARS-CoV-2 target nucleic acids are NOT DETECTED.  The SARS-CoV-2 RNA is generally detectable in upper respiratory specimens during the acute phase of infection. The lowest concentration of SARS-CoV-2 viral copies this assay can detect is 138 copies/mL. A negative result does not preclude SARS-Cov-2 infection and should not be used as the sole basis for treatment or other patient management decisions. A negative result may occur with  improper specimen collection/handling, submission of specimen other than nasopharyngeal swab, presence of viral mutation(s) within the areas targeted by this assay, and inadequate number of viral copies(<138 copies/mL). A negative result must be combined with clinical observations, patient history, and epidemiological information. The expected result is Negative.  Fact Sheet for Patients:  EntrepreneurPulse.com.au  Fact Sheet for Healthcare Providers:  IncredibleEmployment.be  This test is no t yet approved or cleared by the [place] FDA and  has been authorized for detection and/or diagnosis of SARS-CoV-2 by FDA under an Emergency Use Authorization (EUA). This EUA will remain  in effect (meaning this test can be used) for the duration of the COVID-19 declaration under Section  564(b)(1) of the Act, 21 U.S.C.section 360bbb-3(b)(1), unless the authorization is terminated  or revoked sooner.       Influenza A by PCR NEGATIVE NEGATIVE Final   Influenza B by PCR NEGATIVE NEGATIVE Final    Comment: (NOTE) The Xpert Xpress SARS-CoV-2/FLU/RSV plus assay is intended as an aid in the diagnosis of influenza from Nasopharyngeal swab specimens and should not be used as a sole basis for treatment. Nasal washings and aspirates are unacceptable for Xpert Xpress SARS-CoV-2/FLU/RSV testing.  Fact Sheet for Patients: EntrepreneurPulse.com.au  Fact Sheet for Healthcare Providers: IncredibleEmployment.be  This test is not yet approved or cleared by the [place] FDA and has been authorized for detection and/or diagnosis of SARS-CoV-2 by FDA under an Emergency Use Authorization (EUA). This EUA will remain in effect (meaning this test can be used) for the duration of the COVID-19 declaration under Section 564(b)(1) of the Act, 21 U.S.C. section 360bbb-3(b)(1), unless the authorization is terminated or revoked.  Performed at [facility], [address] [place], [place] [address]   Blood culture (routine single)     Status: None (Preliminary result)   Collection Time: 05/21/20  6:28 AM   Specimen: BLOOD RIGHT FOREARM  Result Value Ref Range Status   Specimen Description BLOOD RIGHT FOREARM  Final   Special Requests   Final    BOTTLES DRAWN AEROBIC AND ANAEROBIC Blood Culture adequate volume   Culture   Final    NO GROWTH < 24 HOURS Performed at [facility], [address] [place], [place] [address]    Report Status PENDING  Incomplete    Radiology Reports DG Chest 2 View  Result Date: 04/30/2020 CLINICAL DATA:  Shortness of breath, evaluate for edema, infiltrate EXAM: CHEST - 2 VIEW COMPARISON:  04/21/2020 FINDINGS: Some streaky opacities present in the left upper lung on comparison radiograph are less  well seen. Some chronic hyperinflation and coarsened interstitial changes are fairly similar to prior exam. No new consolidative process. No pneumothorax or visible effusion. The cardiomediastinal contours are unremarkable. No acute osseous or soft tissue abnormality. Telemetry leads overlie the  chest. IMPRESSION: Some streaky opacities in the left upper lung on comparison radiograph are less well seen. Could reflect some interval resolution of previously seen airspace opacity or improving atelectatic changes. Hyperinflation and chronically coarsened interstitial changes. Compatible with history of COPD. Electronically Signed   By: Lovena Le M.D.   On: 04/30/2020 04:40   DG Abd 1 View  Result Date: 04/23/2020 CLINICAL DATA:  Shortness of breath.  COPD. EXAM: ABDOMEN - 1 VIEW COMPARISON:  April 12, 2020 FINDINGS: The bowel gas pattern is normal. No radio-opaque calculi or other significant radiographic abnormality are seen. Osteoarthritic changes of the lower lumbosacral spine. IMPRESSION: Negative. Electronically Signed   By: Fidela Salisbury M.D.   On: 04/23/2020 12:19   DG Chest Portable 1 View  Result Date: 05/21/2020 CLINICAL DATA:  Shortness of breath. EXAM: PORTABLE CHEST 1 VIEW COMPARISON:  Chest x-ray 05/09/2020, CT chest 10/15/2019 FINDINGS: The heart size and mediastinal contours are unchanged. Aortic arch calcifications. Hyperinflation consistent with emphysematous changes. No focal consolidation. No pulmonary edema. Persistent blunting of bilateral costophrenic angles with no definite pleural effusion. No pneumothorax. No acute osseous abnormality. IMPRESSION: No active disease. Electronically Signed   By: Iven Finn M.D.   On: 05/21/2020 06:33   DG Chest Portable 1 View  Result Date: 05/09/2020 CLINICAL DATA:  Shortness of breath EXAM: PORTABLE CHEST 1 VIEW COMPARISON:  Chest 04/30/2020 FINDINGS: The lungs are hyperinflated with diffuse interstitial prominence. No focal airspace  consolidation or pulmonary edema. No pleural effusion or pneumothorax. Normal cardiomediastinal contours. IMPRESSION: COPD without acute airspace disease. Electronically Signed   By: Ulyses Jarred M.D.   On: 05/09/2020 01:05    Lab Data:  CBC: Recent Labs  Lab 05/21/20 0610 05/22/20 0410  WBC 17.7* 17.1*  NEUTROABS 10.9*  --   HGB 15.2 14.8  HCT 47.0 45.8  MCV 94.0 91.8  PLT 276 725   Basic Metabolic Panel: Recent Labs  Lab 05/21/20 0610 05/22/20 0410  NA 139 139  K 4.1 3.7  CL 103 102  CO2 27 28  GLUCOSE 121* 119*  BUN 11 17  CREATININE 1.05 0.87  CALCIUM 9.2 9.4  MG 1.9  --    GFR: Estimated Creatinine Clearance: 61.5 mL/min (by C-G formula based on SCr of 0.87 mg/dL). Liver Function Tests: Recent Labs  Lab 05/21/20 0610  AST 43*  ALT 45*  ALKPHOS 82  BILITOT 0.9  PROT 7.0  ALBUMIN 4.0   No results for input(s): LIPASE, AMYLASE in the last 168 hours. No results for input(s): AMMONIA in the last 168 hours. Coagulation Profile: Recent Labs  Lab 05/21/20 0628  INR 0.9   Cardiac Enzymes: No results for input(s): CKTOTAL, CKMB, CKMBINDEX, TROPONINI in the last 168 hours. BNP (last 3 results) No results for input(s): PROBNP in the last 8760 hours. HbA1C: No results for input(s): HGBA1C in the last 72 hours. CBG: No results for input(s): GLUCAP in the last 168 hours. Lipid Profile: Recent Labs    05/22/20 0410  CHOL 172  HDL 90  LDLCALC 62  TRIG 102  CHOLHDL 1.9   Thyroid Function Tests: No results for input(s): TSH, T4TOTAL, FREET4, T3FREE, THYROIDAB in the last 72 hours. Anemia Panel: No results for input(s): VITAMINB12, FOLATE, FERRITIN, TIBC, IRON, RETICCTPCT in the last 72 hours. Urine analysis:    Component Value Date/Time   COLORURINE YELLOW (A) 05/09/2020 0525   APPEARANCEUR CLEAR (A) 05/09/2020 0525   APPEARANCEUR Clear 09/04/2019 1429   LABSPEC 1.016 05/09/2020 0525  LABSPEC 1.014 11/09/2013 1525   PHURINE 5.0 05/09/2020 0525    GLUCOSEU NEGATIVE 05/09/2020 0525   GLUCOSEU Negative 11/09/2013 1525   HGBUR NEGATIVE 05/09/2020 0525   BILIRUBINUR NEGATIVE 05/09/2020 0525   BILIRUBINUR Negative 09/04/2019 1429   BILIRUBINUR Negative 11/09/2013 [place] 05/09/2020 0525   PROTEINUR NEGATIVE 05/09/2020 0525   NITRITE NEGATIVE 05/09/2020 0525   LEUKOCYTESUR NEGATIVE 05/09/2020 0525   LEUKOCYTESUR Negative 11/09/2013 1525     Drusilla Wampole M.D. [facility] 05/22/2020, 10:12 AM  Available via Epic secure chat 7am-7pm After 7 pm, please refer to night coverage provider listed on amion.

## 2020-05-23 ENCOUNTER — Inpatient Hospital Stay (HOSPITAL_COMMUNITY)
Admit: 2020-05-23 | Discharge: 2020-05-23 | Disposition: A | Discharge status: Home / Self Care | Payer: Medicare Other | Attending: Internal Medicine | Admitting: Internal Medicine

## 2020-05-23 DIAGNOSIS — J9621 Acute and chronic respiratory failure with hypoxia: Secondary | ICD-10-CM | POA: Diagnosis not present

## 2020-05-23 DIAGNOSIS — J441 Chronic obstructive pulmonary disease with (acute) exacerbation: Secondary | ICD-10-CM

## 2020-05-23 DIAGNOSIS — R0609 Other forms of dyspnea: Secondary | ICD-10-CM

## 2020-05-23 DIAGNOSIS — F418 Other specified anxiety disorders: Secondary | ICD-10-CM | POA: Diagnosis not present

## 2020-05-23 DIAGNOSIS — J9601 Acute respiratory failure with hypoxia: Secondary | ICD-10-CM | POA: Diagnosis not present

## 2020-05-23 LAB — BASIC METABOLIC PANEL
Anion gap: 9 (ref 5–15)
BUN: 20 mg/dL (ref 8–23)
CO2: 30 mmol/L (ref 22–32)
Calcium: 9.6 mg/dL (ref 8.9–10.3)
Chloride: 99 mmol/L (ref 98–111)
Creatinine, Ser: 0.89 mg/dL (ref 0.61–1.24)
GFR, Estimated: >60 mL/min (ref >60)
Glucose, Bld: 120 mg/dL — ABNORMAL HIGH (ref 70–99)
Potassium: 3.7 mmol/L (ref 3.5–5.1)
Sodium: 138 mmol/L (ref 135–145)

## 2020-05-23 LAB — CBC
HCT: 48.5 % (ref 39.0–52.0)
Hemoglobin: 15.9 g/dL (ref 13.0–17.0)
MCH: 30.1 pg (ref 26.0–34.0)
MCHC: 32.8 g/dL (ref 30.0–36.0)
MCV: 91.9 fL (ref 80.0–100.0)
Platelets: 297 10*3/uL (ref 150–400)
RBC: 5.28 MIL/uL (ref 4.22–5.81)
RDW: 13.9 % (ref 11.5–15.5)
WBC: 22.2 10*3/uL — ABNORMAL HIGH (ref 4.0–10.5)
nRBC: 0 % (ref 0.0–0.2)

## 2020-05-23 LAB — ECHOCARDIOGRAM COMPLETE
Height: 64 in
S' Lateral: 2.41 cm
Weight: 2027.2 oz

## 2020-05-23 MED ORDER — DOXYCYCLINE HYCLATE 100 MG PO TABS: 100.0000 mg | ORAL_TABLET | Freq: Two times a day (BID) | ORAL | 0 refills | Status: AC

## 2020-05-23 MED ORDER — DM-GUAIFENESIN ER 30-600 MG PO TB12: 1.0000 | ORAL_TABLET | Freq: Two times a day (BID) | ORAL | 0 refills | Status: AC | PRN

## 2020-05-23 MED ORDER — MOMETASONE FURO-FORMOTEROL FUM 100-5 MCG/ACT IN AERO: 2.0000 | INHALATION_SPRAY | Freq: Two times a day (BID) | RESPIRATORY_TRACT | 3 refills | Status: AC

## 2020-05-23 MED ORDER — ALBUTEROL SULFATE HFA 108 (90 BASE) MCG/ACT IN AERS: INHALATION_SPRAY | RESPIRATORY_TRACT | 2 refills | Status: AC

## 2020-05-23 MED ORDER — NITROGLYCERIN 0.4 MG SL SUBL: 0.4000 mg | SUBLINGUAL_TABLET | SUBLINGUAL | 12 refills | Status: AC | PRN

## 2020-05-23 MED ORDER — PREDNISONE 10 MG PO TABS: ORAL_TABLET | ORAL | 0 refills | Status: AC

## 2020-05-23 MED ORDER — ASPIRIN 81 MG PO TBEC: 81.0000 mg | DELAYED_RELEASE_TABLET | Freq: Every day | ORAL | 11 refills | Status: AC

## 2020-05-23 NOTE — Progress Notes (Signed)
*  PRELIMINARY RESULTS* Echocardiogram 2D Echocardiogram has been performed.  Carlos Nelson 05/23/2020, 12:23 PM

## 2020-05-23 NOTE — Discharge Summary (Signed)
Physician Discharge Summary   [name] MRN: [id number] DOB/AGE: 1950-03-22 70 y.o.  Admit date: 05/21/2020 Discharge date: 05/23/2020  Primary Care Physician:  [facility]   Recommendations for Outpatient Follow-up:  1. Follow up with PCP in 1-2 weeks  Home Health: Ambulating without any difficulty Equipment/Devices:   Discharge Condition: stable  CODE STATUS: FULL  Diet recommendation: Heart healthy diet   Discharge Diagnoses:   . Acute on chronic respiratory failure with hypoxia and hypercapnia (HCC) . COPD exacerbation ([place]) . Hypertensive urgency . GERD . Schizophrenia ([place]) . Tobacco abuse . Noncompliant . CAD (coronary artery disease) . Depression with anxiety . Sepsis ([place]) . Dementia   Consults: None    Allergies:   Allergies  Allergen Reactions  . Ace Inhibitors Swelling    Ok with benazapril, per grouphome     DISCHARGE MEDICATIONS: Allergies as of 05/23/2020      Reactions   Ace Inhibitors Swelling   Ok with benazapril, per grouphome      Medication List    TAKE these medications   albuterol 108 (90 Base) MCG/ACT inhaler Commonly known as: VENTOLIN HFA Inhale 2 by mouth every 4 hours as needed for wheezing, cough, and/or shortness of breath   amlodipine-olmesartan 10-20 MG tablet Commonly known as: AZOR Take 1 tablet by mouth daily.   ARIPiprazole 30 MG tablet Commonly known as: ABILIFY Take 30 mg by mouth daily.   aspirin 81 MG EC tablet Take 1 tablet (81 mg total) by mouth daily. Swallow whole. Start taking on: May 24, 2020   dextromethorphan-guaiFENesin 30-600 MG 12hr tablet Commonly known as: MUCINEX DM Take 1 tablet by mouth 2 (two) times daily as needed for cough.   donepezil 5 MG tablet Commonly known as: ARICEPT Take 5 mg by mouth at bedtime.   doxycycline 100 MG tablet Commonly known as: VIBRA-TABS Take 1 tablet (100 mg total) by mouth 2 (two) times daily for 7 days.    ipratropium-albuterol 0.5-2.5 (3) MG/3ML Soln Commonly known as: DUONEB Take 3 mLs by nebulization every 4 (four) hours for 14 days. What changed:   when to take this  reasons to take this   mometasone-formoterol 100-5 MCG/ACT Aero Commonly known as: DULERA Inhale 2 puffs into the lungs 2 (two) times daily.   nitroGLYCERIN 0.4 MG SL tablet Commonly known as: NITROSTAT Place 1 tablet (0.4 mg total) under the tongue every 5 (five) minutes as needed for chest pain.   pantoprazole 20 MG tablet Commonly known as: Protonix Take 1 tablet (20 mg total) by mouth daily.   predniSONE 10 MG tablet Commonly known as: DELTASONE Prednisone dosing: Take  Prednisone 42m (4 tabs) x 3 days, then taper to 359m(3 tabs) x 3 days, then 207m2 tabs) x 3days, then 18m14m tab) x 3days, then OFF. What changed: See the new instructions.   QUEtiapine 200 MG tablet Commonly known as: SEROquel Take 3 tablets (600 mg total) by mouth at bedtime. What changed: Another medication with the same name was removed. Continue taking this medication, and follow the directions you see here.   traZODone 100 MG tablet Commonly known as: DESYREL Take 100 mg by mouth at bedtime.        Brief H and P: For complete details please refer to admission H and P, but in brief **Patient is a 70 y72r old male with history of COPD, hypertension, hyperlipidemia, GERD, depression/anxiety, schizophrenia, CAD ongoing tobacco abuse, medical noncompliance, supposed to be on O2 2  L via [place] (not using) presented with shortness of breath.  Patient was recently hospitalized and discharged on 4/26 due to COPD exacerbation.  He was discharged on 2 L O2 and prednisone taper.  Per patient, he has not been using the oxygen as he continues to smoke and would be a fire hazard.  At the time of admission he was in severe respiratory distress, gasping for air and could not speak in full sentences. O2 sats were down to 55% on room air and BiPAP was  started.  He also reports chest tightness. Patient was admitted for acute respiratory failure with hypoxia secondary to COPD exacerbation.   Hospital Course:  Acute on chronic respiratory failure with hypoxia and hypercapnia ([place]), sepsis secondary to COPD exacerbation -Patient met the criteria for sepsis with leukocytosis (could be due to prednisone), tachycardia heart rate 126, tachypnea with respiratory rate 40.  Lactic acid normal.  -Initially needed BiPAP, currently off, ambulating without any difficulty, feels at his baseline -Patient was placed on IV Solu-Medrol, doxycycline, scheduled nebs.  Added Dulera -Counseled strongly on tobacco cessation -Procalcitonin less than 0.1 -2D echo showed EF of 60 to 65%, moderate LVH, no regional wall motion abnormalities right ventricular systolic function normal.   Tobacco use -Counseled strongly on tobacco cessation, smokes 8 cigarettes a day - patient has no intention of quitting  Coronary artery disease -Patient reported chest tightness could be due to COPD exacerbation, demand ischemia -Troponins mildly elevated, likely due to demand ischemia, trending down. -  D dimer less than 0.27 -2D echo showed EF of 60 to 65% no regional wall motion abnormalities  Hypertensive urgency -Noncompliant with antihypertensives. -  Supposed to be taking Azor (amlodipine-olmesartan) 10-20 mg daily.   -Continue hydralazine IV as needed with parameters    Schizophrenia ([place]), anxiety -Continue Abilify, Seroquel   GERD Continue PPI   Dementia -Continue Aricept    Day of Discharge S: No acute complaints, wants to go home.  Ambulating without any difficulty, no shortness of breath.  BP (!) 158/93 (BP Location: Left Arm)   Pulse 99   Temp 98.1 F (36.7 C) (Oral)   Resp 20   Ht 5' 4"  (1.626 m)   Wt 57.5 kg   SpO2 97%   BMI 21.75 kg/m   Physical Exam: General: Alert and awake oriented x3 not in any acute distress. HEENT:  anicteric sclera, pupils reactive to light and accommodation CVS: S1-S2 clear no murmur rubs or gallops Chest: clear to auscultation bilaterally, no wheezing rales or rhonchi Abdomen: soft nontender, nondistended, normal bowel sounds Extremities: no cyanosis, clubbing or edema noted bilaterally Neuro: Cranial nerves II-XII intact, no focal neurological deficits    Get Medicines reviewed and adjusted: Please take all your medications with you for your next visit with your Primary MD  Please request your Primary MD to go over all hospital tests and procedure/radiological results at the follow up. Please ask your Primary MD to get all Hospital records sent to his/her office.  If you experience worsening of your admission symptoms, develop shortness of breath, life threatening emergency, suicidal or homicidal thoughts you must seek medical attention immediately by calling 911 or calling your MD immediately  if symptoms less severe.  You must read complete instructions/literature along with all the possible adverse reactions/side effects for all the Medicines you take and that have been prescribed to you. Take any new Medicines after you have completely understood and accept all the possible adverse reactions/side effects.   Do  not drive when taking pain medications.   Do not take more than prescribed Pain, Sleep and Anxiety Medications  Special Instructions: If you have smoked or chewed Tobacco  in the last 2 yrs please stop smoking, stop any regular Alcohol  and or any Recreational drug use.  Wear Seat belts while driving.  Please note  You were cared for by a hospitalist during your hospital stay. Once you are discharged, your primary care physician will handle any further medical issues. Please note that NO REFILLS for any discharge medications will be authorized once you are discharged, as it is imperative that you return to your primary care physician (or establish a relationship with a  primary care physician if you do not have one) for your aftercare needs so that they can reassess your need for medications and monitor your lab values.   The results of significant diagnostics from this hospitalization (including imaging, microbiology, ancillary and laboratory) are listed below for reference.      Procedures/Studies:  DG Chest 2 View  Result Date: 04/30/2020 CLINICAL DATA:  Shortness of breath, evaluate for edema, infiltrate EXAM: CHEST - 2 VIEW COMPARISON:  04/21/2020 FINDINGS: Some streaky opacities present in the left upper lung on comparison radiograph are less well seen. Some chronic hyperinflation and coarsened interstitial changes are fairly similar to prior exam. No new consolidative process. No pneumothorax or visible effusion. The cardiomediastinal contours are unremarkable. No acute osseous or soft tissue abnormality. Telemetry leads overlie the chest. IMPRESSION: Some streaky opacities in the left upper lung on comparison radiograph are less well seen. Could reflect some interval resolution of previously seen airspace opacity or improving atelectatic changes. Hyperinflation and chronically coarsened interstitial changes. Compatible with history of COPD. Electronically Signed   By: Lovena Le M.D.   On: 04/30/2020 04:40   DG Chest Portable 1 View  Result Date: 05/21/2020 CLINICAL DATA:  Shortness of breath. EXAM: PORTABLE CHEST 1 VIEW COMPARISON:  Chest x-ray 05/09/2020, CT chest 10/15/2019 FINDINGS: The heart size and mediastinal contours are unchanged. Aortic arch calcifications. Hyperinflation consistent with emphysematous changes. No focal consolidation. No pulmonary edema. Persistent blunting of bilateral costophrenic angles with no definite pleural effusion. No pneumothorax. No acute osseous abnormality. IMPRESSION: No active disease. Electronically Signed   By: Iven Finn M.D.   On: 05/21/2020 06:33   DG Chest Portable 1 View  Result Date:  05/09/2020 CLINICAL DATA:  Shortness of breath EXAM: PORTABLE CHEST 1 VIEW COMPARISON:  Chest 04/30/2020 FINDINGS: The lungs are hyperinflated with diffuse interstitial prominence. No focal airspace consolidation or pulmonary edema. No pleural effusion or pneumothorax. Normal cardiomediastinal contours. IMPRESSION: COPD without acute airspace disease. Electronically Signed   By: Ulyses Jarred M.D.   On: 05/09/2020 01:05   ECHOCARDIOGRAM COMPLETE  Result Date: 05/23/2020    ECHOCARDIOGRAM REPORT   Patient Name:   Carlos Nelson Date of Exam: 05/23/2020 Medical Rec #:  [id number]         Height:       64.0 in Accession #:    [id number]        Weight:       126.7 lb Date of Birth:  09-27-1950          BSA:          1.611 m Patient Age:    85 years          BP:           181/97 mmHg Patient Gender: M  HR:           98 bpm. Exam Location:  [facility] Procedure: 2D Echo, Cardiac Doppler and Color Doppler Indications:     Dyspnea R06.00  History:         Patient has prior history of Echocardiogram examinations, most                  recent 06/09/2019. COPD; Risk Factors:Hypertension.  Sonographer:     Sherrie Sport RDCS (AE) Referring Phys:  [id number] Marge Vandermeulen K Jeremian Whitby Diagnosing Phys: Ida Rogue MD  Sonographer Comments: No apical window and no parasternal window. Image acquisition challenging due to COPD and Image acquisition challenging due to patient body habitus. IMPRESSIONS  1. Left ventricular ejection fraction, by estimation, is 60 to 65%. The left ventricle has normal function. The left ventricle has no regional wall motion abnormalities. There is moderate left ventricular hypertrophy. Left ventricular diastolic parameters are indeterminate.  2. Right ventricular systolic function is normal. The right ventricular size is normal. There is normal pulmonary artery systolic pressure. The estimated right ventricular systolic pressure is 19.6 mmHg. FINDINGS  Left Ventricle: Left ventricular ejection fraction, by  estimation, is 60 to 65%. The left ventricle has normal function. The left ventricle has no regional wall motion abnormalities. The left ventricular internal cavity size was normal in size. There is  moderate left ventricular hypertrophy. Left ventricular diastolic parameters are indeterminate. Right Ventricle: The right ventricular size is normal. No increase in right ventricular wall thickness. Right ventricular systolic function is normal. There is normal pulmonary artery systolic pressure. The tricuspid regurgitant velocity is 1.45 m/s, and  with an assumed right atrial pressure of 5 mmHg, the estimated right ventricular systolic pressure is 22.2 mmHg. Left Atrium: Left atrial size was normal in size. Right Atrium: Right atrial size was normal in size. Pericardium: There is no evidence of pericardial effusion. Mitral Valve: The mitral valve is normal in structure. No evidence of mitral valve regurgitation. No evidence of mitral valve stenosis. Tricuspid Valve: The tricuspid valve is normal in structure. Tricuspid valve regurgitation is not demonstrated. No evidence of tricuspid stenosis. Aortic Valve: The aortic valve is normal in structure. Aortic valve regurgitation is not visualized. Mild aortic valve sclerosis is present, with no evidence of aortic valve stenosis. Pulmonic Valve: The pulmonic valve was normal in structure. Pulmonic valve regurgitation is not visualized. No evidence of pulmonic stenosis. Aorta: The aortic root is normal in size and structure. Venous: The inferior vena cava is normal in size with greater than 50% respiratory variability, suggesting right atrial pressure of 3 mmHg. IAS/Shunts: No atrial level shunt detected by color flow Doppler.  LEFT VENTRICLE PLAX 2D LVIDd:         3.26 cm LVIDs:         2.41 cm LV PW:         1.84 cm LV IVS:        1.18 cm  LEFT ATRIUM         Index LA diam:    4.10 cm 2.54 cm/m  PULMONIC VALVE PV Vmax:        1.17 m/s PV Peak grad:   5.5 mmHg RVOT Peak  grad: 8 mmHg  TRICUSPID VALVE TR Peak grad:   8.4 mmHg TR Vmax:        145.00 cm/s Ida Rogue MD Electronically signed by Ida Rogue MD Signature Date/Time: 05/23/2020/2:50:23 PM    Final        LAB RESULTS: Basic  Metabolic Panel: Recent Labs  Lab 05/21/20 0610 05/22/20 0410 05/23/20 0925  NA 139 139 138  K 4.1 3.7 3.7  CL 103 102 99  CO2 27 28 30   GLUCOSE 121* 119* 120*  BUN 11 17 20   CREATININE 1.05 0.87 0.89  CALCIUM 9.2 9.4 9.6  MG 1.9  --   --    Liver Function Tests: Recent Labs  Lab 05/21/20 0610  AST 43*  ALT 45*  ALKPHOS 82  BILITOT 0.9  PROT 7.0  ALBUMIN 4.0   No results for input(s): LIPASE, AMYLASE in the last 168 hours. No results for input(s): AMMONIA in the last 168 hours. CBC: Recent Labs  Lab 05/21/20 0610 05/22/20 0410 05/23/20 0619  WBC 17.7* 17.1* 22.2*  NEUTROABS 10.9*  --   --   HGB 15.2 14.8 15.9  HCT 47.0 45.8 48.5  MCV 94.0 91.8 91.9  PLT 276 249 297   Cardiac Enzymes: No results for input(s): CKTOTAL, CKMB, CKMBINDEX, TROPONINI in the last 168 hours. BNP: Invalid input(s): POCBNP CBG: No results for input(s): GLUCAP in the last 168 hours.     Disposition and Follow-up: Discharge Instructions    (HEART FAILURE PATIENTS) Call MD:  Anytime you have any of the following symptoms: 1) 3 pound weight gain in 24 hours or 5 pounds in 1 week 2) shortness of breath, with or without a dry hacking cough 3) swelling in the hands, feet or stomach 4) if you have to sleep on extra pillows at night in order to breathe.   Complete by: As directed    Diet - low sodium heart healthy   Complete by: As directed    Increase activity slowly   Complete by: As directed        DISPOSITION: Home   DISCHARGE FOLLOW-UP  Follow-up Information    [facility]. Schedule an appointment as soon as possible for a visit in 2 week(s).   Contact information: [place] [address] [phone]        Nelva Bush, MD .   Specialty: Cardiology Contact information: [place] [address] [phone]                Time coordinating discharge:  35 mins  Signed:   Estill Cotta M.D. [facility] 05/23/2020, 3:10 PM

## 2020-05-23 NOTE — Progress Notes (Signed)
*  PRELIMINARY RESULTS* Echocardiogram 2D Echocardiogram                                                                                                   has been performed.  Carlos Nelson 05/23/2020, 12:21 PM

## 2020-05-23 NOTE — Progress Notes (Signed)
*  PRELIMINARY RESULTS* Echocardiogram 2D Echocardiogram                                                                         has been performed.  Sherrie Sport 05/23/2020, 12:19 PM

## 2020-05-23 NOTE — NC FL2 (Signed)
[place] LEVEL OF CARE SCREENING TOOL     IDENTIFICATION  Patient Name: Carlos Nelson Birthdate: 03-Aug-1950 Sex: male Admission Date (Current Location): 05/21/2020  [place] and [place] Number:  Engineering geologist and Address:  [facility], [address] [place], [place] [address]      Provider Number: [id number]  Attending Physician Name and Address:  Mendel Corning, MD  Relative Name and Phone Number:  Angelena Sole [phone]    Current Level of Care: Hospital Recommended Level of Care: Family Care Home Prior Approval Number:    Date Approved/Denied:   PASRR Number:    Discharge Plan: Other (Comment)    Current Diagnoses: Patient Active Problem List   Diagnosis Date Noted  . Sepsis ([place]) 05/21/2020  . Chest pain 05/21/2020  . Acute respiratory failure with hypoxia and hypercapnia (HCC)   . Protein-calorie malnutrition, severe 03/18/2020  . COPD with acute exacerbation ([place]) 03/10/2020  . Right-sided chest pain 03/10/2020  . HLD (hyperlipidemia) 01/18/2020  . CAD (coronary artery disease) 01/18/2020  . Hypertensive urgency 01/18/2020  . Tobacco abuse 01/18/2020  . Scrotum pain   . Transaminitis   . Demand ischemia ([place]) 06/11/2019  . Malnutrition of moderate degree 06/10/2019  . NSTEMI (non-ST elevated myocardial infarction) ([place]) 06/09/2019  . Depression with anxiety 06/09/2019  . Pleuritic chest pain 05/20/2019  . CAP (community acquired pneumonia) 05/20/2019  . Hypertension   . COPD exacerbation ([place])   . Acute on chronic respiratory failure with hypoxia and hypercapnia (HCC)   . Altered mental status 09/25/2018  . Schizophrenia ([place])   . Tachycardia 08/21/2018  . Schizophrenia, chronic with acute exacerbation ([place]) 08/17/2018  . Elevated PSA 05/24/2015    Orientation RESPIRATION BLADDER Height & Weight     Self,Time,Place  Normal Continent Weight: 57.5 kg Height:  5\' 4"  (162.6 cm)  BEHAVIORAL  SYMPTOMS/MOOD NEUROLOGICAL BOWEL NUTRITION STATUS      Continent Diet  AMBULATORY STATUS COMMUNICATION OF NEEDS Skin   Independent Verbally Normal                       Personal Care Assistance Level of Assistance              Functional Limitations Info  Sight,Hearing,Speech Sight Info: Adequate Hearing Info: Adequate Speech Info: Adequate    SPECIAL CARE FACTORS FREQUENCY                       Contractures Contractures Info: Not present    Additional Factors Info  Code Status,Allergies Code Status Info: Full Allergies Info: Ace Inhibitors           Current Medications (05/23/2020):  This is the current hospital active medication list Current Facility-Administered Medications  Medication Dose Route Frequency Provider Last Rate Last Admin  . acetaminophen (TYLENOL) tablet 650 mg  650 mg Oral Q6H PRN Ivor Costa, MD      . amLODipine (NORVASC) tablet 10 mg  10 mg Oral Daily Ivor Costa, MD   10 mg at 05/23/20 0844  . ARIPiprazole (ABILIFY) tablet 30 mg  30 mg Oral Daily Ivor Costa, MD   30 mg at 05/23/20 0844  . aspirin EC tablet 81 mg  81 mg Oral Daily Ivor Costa, MD   81 mg at 05/23/20 0844  . dextromethorphan-guaiFENesin (MUCINEX DM) 30-600 MG per 12 hr tablet 1 tablet  1 tablet Oral BID PRN Ivor Costa, MD   1  tablet at 05/23/20 0844  . docusate sodium (COLACE) capsule 100 mg  100 mg Oral BID Rai, Ripudeep K, MD   100 mg at 05/23/20 0844  . donepezil (ARICEPT) tablet 5 mg  5 mg Oral QHS Ivor Costa, MD   5 mg at 05/22/20 2022  . doxycycline (VIBRA-TABS) tablet 100 mg  100 mg Oral Q12H Ivor Costa, MD   100 mg at 05/23/20 0844  . enoxaparin (LOVENOX) injection 40 mg  40 mg Subcutaneous Q24H Ivor Costa, MD   40 mg at 05/23/20 0845  . hydrALAZINE (APRESOLINE) injection 5 mg  5 mg Intravenous Q2H PRN Ivor Costa, MD   5 mg at 05/21/20 1138  . ipratropium-albuterol (DUONEB) 0.5-2.5 (3) MG/3ML nebulizer solution 3 mL  3 mL Nebulization Q4H PRN Rai, Ripudeep K, MD   3  mL at 05/23/20 0300  . irbesartan (AVAPRO) tablet 150 mg  150 mg Oral Daily Rai, Ripudeep K, MD   150 mg at 05/23/20 0844  . methylPREDNISolone sodium succinate (SOLU-MEDROL) 40 mg/mL injection 40 mg  40 mg Intravenous Q12H Ivor Costa, MD   40 mg at 05/23/20 0503  . mometasone-formoterol (DULERA) 100-5 MCG/ACT inhaler 2 puff  2 puff Inhalation BID Rai, Ripudeep K, MD   2 puff at 05/23/20 0844  . morphine 2 MG/ML injection 1 mg  1 mg Intravenous Q4H PRN Ivor Costa, MD   1 mg at 05/23/20 1551  . nicotine (NICODERM CQ - dosed in mg/24 hours) patch 21 mg  21 mg Transdermal Daily Ivor Costa, MD      . nitroGLYCERIN (NITROSTAT) SL tablet 0.4 mg  0.4 mg Sublingual Q5 min PRN Ivor Costa, MD   0.4 mg at 05/21/20 7948  . ondansetron (ZOFRAN) injection 4 mg  4 mg Intravenous Q8H PRN Ivor Costa, MD      . pantoprazole (PROTONIX) EC tablet 20 mg  20 mg Oral Daily Ivor Costa, MD   20 mg at 05/23/20 0844  . QUEtiapine (SEROQUEL) tablet 600 mg  600 mg Oral QHS Ivor Costa, MD   600 mg at 05/22/20 2022  . traZODone (DESYREL) tablet 100 mg  100 mg Oral QHS Ivor Costa, MD   100 mg at 05/22/20 2022     Discharge Medications: Please see discharge summary for a list of discharge medications.  Relevant Imaging Results:  Relevant Lab Results:   Additional Information ss# [id number]  Kerin Salen, RN

## 2020-05-23 NOTE — Plan of Care (Signed)
  Problem: Health Behavior/Discharge Planning: Goal: Ability to manage health-related needs will improve 05/23/2020 1729 by Alen Blew, RN Outcome: Adequate for Discharge 05/23/2020 1729 by Alen Blew, RN Outcome: Adequate for Discharge   Problem: Clinical Measurements: Goal: Ability to maintain clinical measurements within normal limits will improve 05/23/2020 1729 by Alen Blew, RN Outcome: Adequate for Discharge 05/23/2020 1729 by Alen Blew, RN Outcome: Adequate for Discharge Goal: Will remain free from infection 05/23/2020 1729 by Alen Blew, RN Outcome: Adequate for Discharge 05/23/2020 1729 by Alen Blew, RN Outcome: Adequate for Discharge Goal: Diagnostic test results will improve 05/23/2020 1729 by Alen Blew, RN Outcome: Adequate for Discharge 05/23/2020 1729 by Alen Blew, RN Outcome: Adequate for Discharge Goal: Respiratory complications will improve 05/23/2020 1729 by Alen Blew, RN Outcome: Adequate for Discharge 05/23/2020 1729 by Alen Blew, RN Outcome: Adequate for Discharge Goal: Cardiovascular complication will be avoided 05/23/2020 1729 by Alen Blew, RN Outcome: Adequate for Discharge 05/23/2020 1729 by Alen Blew, RN Outcome: Adequate for Discharge

## 2020-05-24 NOTE — Congregational Nurse Program (Signed)
  Dept: [facility] Nurse Program Note  Date of Encounter: 05/24/2020 Client in for vital sign check. Discussed recent [facility] admission. He reports he was discharged on steroids and did have his rescue inhaler with him today. AVS reviewed and new medications noted. Client is given medications by staff at his group home. Support given. Past Medical History: Past Medical History:  Diagnosis Date  . Chronic respiratory failure with hypoxia ([place])   . COPD (chronic obstructive pulmonary disease) ([place])   . Hypertension   . Schizophrenia [facility])     Encounter Details:  CNP Questionnaire - 05/24/20 1205      Questionnaire   Visit Setting Church or Organization    Location Patient Served At [facility]    Patient Status Not Applicable    Medical Provider Yes    Insurance Medicaid    Intervention Assess (including screenings)

## 2020-05-26 ENCOUNTER — Other Ambulatory Visit: Payer: Self-pay | Admitting: Nurse Practitioner

## 2020-05-26 ENCOUNTER — Encounter: Payer: Self-pay | Admitting: Nurse Practitioner

## 2020-05-26 ENCOUNTER — Other Ambulatory Visit: Payer: Self-pay

## 2020-05-26 VITALS — HR 92 | Resp 18

## 2020-05-26 DIAGNOSIS — Z515 Encounter for palliative care: Secondary | ICD-10-CM

## 2020-05-26 DIAGNOSIS — R0602 Shortness of breath: Secondary | ICD-10-CM

## 2020-05-26 DIAGNOSIS — F209 Schizophrenia, unspecified: Secondary | ICD-10-CM

## 2020-05-26 LAB — CULTURE, BLOOD (SINGLE)
Culture: NO GROWTH
Special Requests: ADEQUATE

## 2020-05-26 NOTE — Congregational Nurse Program (Signed)
  Dept: [place] Nurse Program Note  Date of Encounter: 05/26/2020 Client in for biweekly blood pressure check. He is now carrying his rescue inhaler with him. Reviewed upcoming appointments with him. He does not know if an appointment has been made with his PCP. HE does have an upcoming appointment with Cardiology set up at his last hospital discharge. Will continue to provide support.  Past Medical History: Past Medical History:  Diagnosis Date  . Chronic respiratory failure with hypoxia ([place])   . COPD (chronic obstructive pulmonary disease) ([place])   . Hypertension   . Schizophrenia ([place])     Encounter Details:

## 2020-05-26 NOTE — Progress Notes (Signed)
[facility] Palliative Care Consult Note Telephone: [phone]  Fax: [phone]    Date of encounter: 05/26/20 PATIENT NAME: Carlos Nelson [address] [place] [address]   [id number] (home)  DOB: 1950/02/07 MRN: [id number] PRIMARY CARE PROVIDER:    [facility],  [address] [place] [address] [phone]   RESPONSIBLE PARTY:    Contact Information    Name Relation Home Work [address]   Carlos Nelson Other [phone]  [phone]   [facility] Carlos Nelson, [place] Sister [phone]  [phone]     I met face to face with patient in facility. Palliative Care was asked to follow this patient by consultation request of  [facility] to address advance care planning and complex medical decision making. This is a follow up visit.  ASSESSMENT AND PLAN / RECOMMENDATIONS:  Symptom Management/Plan: 1.Advance Care Planning;Full code with aggressive interventions  2. Shortness of breath with wheezing secondary to COPD, no sign of acute exacerbation; continue nebulizer and inhaler with Primary to continue to manage  3. Schizophrenia; asked PC SW to re-contact Carlos Nelson to assist in connecting with Mental health services for psychiatry with psychotherapy appointment as he has been non-compliant with meds with active hallucinations visual/auditory and Carlos Nelson has been unsuccessful in arranging mental health appointments  4. Tobacco abuse-not interested in quitting.Smoking cessation encouraged. Recommend utilization of 1800QUITNOW.  5. Goals of Care: Goals include to maximize quality of life and symptom management. Our advance care planning conversation included a discussion about:   The value and importance of advance care planning  Exploration of personal, cultural or spiritual beliefs that might influence medical decisions  Exploration of goals of care in the event  of a sudden injury or illness  Identification and preparation of a healthcare agent  Review and updating or creation of anadvance directive document.  6.Palliative care encounter; Palliative care encounter; Palliative medicine team will continue to support patient, patient's family, and medical team. Visit consisted of counseling and education dealing with the complex and emotionally intense issues of symptom management and palliative care in the setting of serious and potentially life-threatening illness  Follow up Palliative Care Visit: Palliative care will continue to follow for complex medical decision making, advance care planning, and clarification of goals. Return 4 weeks or prn.  I spent 60 minutes providing this consultation. More than 50% of the time in this consultation was spent in counseling and care coordination.  PPS: 50%  HOSPICE ELIGIBILITY/DIAGNOSIS: TBD  Chief Complaint: Follow up palliative consult for complex medical decision making  HISTORY OF PRESENT ILLNESS:  Carlos Nelson is a 70 y.o. year old male  with schizophrenia, COPD, anxiety,tobacco abuse,hypertension, hyperlipidemiapresents today for hospital follow-up.   10/15/2019 ED visit likely COPD exacerbation  12/18/2019 ED visit suspected gastritis started on protonix, carafate, symptoms improved after gi cocktail  01/18/2020 ED visit sob, hypoxic requiring bipap, hypertensive, tachycardic with IV diltiazem, mild respiratory acidosis likely due to non-compliance causing COPD exacerbation  02/03/2020 ED visit chest pain, sob negative troponin, has not had seroquel, non-compliance with meds for COPD  02/20/2020 ED visit chest pain, SOB with troponin unchanged, cxr negative, treat with decadron, duonebs which improved  02/21/2020 to 02/22/2020 ED visit chest pain with SOB, gave additional duonebs, felt better  03/01/2020 ED visit for SOB, received by ema 2 duonebs with solumedrol, felt better when arrived  in ED. COPD exacerbation, refilled albuterol inhaler  Hospitalized  03/10/2020 to 03/11/2020 for chest pain, copd exacerbation, hypertensive urgency, right sided chest pain atypical likely due to increase work of breathing, EKG non-acute.   Hospitalized 03/16/2020 to 03/18/2020 for COPD with acute exacerbation. Workup acute hypoxic respiratory failure.   [facility]-ED visit 04/12/2020 for constipation d/c with colace and mirlax  Hospitalized 04/21/2020 to 04/23/2020 for COPD exacerbation and respiratory failure with PNA  [facility]-ED visit 04/30/2020 for abdominal pain/shortness of breath with workup significant for COPD  Hospitalized 05/09/2020 to 05/10/2020 for acute recurrent hypoxia in the setting of COPD exacerbation with continuous oxygen supplement which Carlos Nelson declines to wear, he wants to smoke  Hospitalized 05/21/2020 to 05/23/2020 for Acute on chronic respiratory failure with hypoxia and hypercapnia, sepsis secondary to COPD exacerbation, met the criteria for sepsis with leukocytosisrequiring bipap. 2decho EF 60 to 65%; CAD with chest tightness due to copd exacerbation with demand ischemia, HTN urgency with noncompliance with antihypertensives, schizophrenia with anxiety continued on Seroquel with Abilify, continue Aricept for dementia.   I called Carlos Nelson to confirm f/u PC visit and covid screening which was negative. Carlos Nelson and I talked about recent multiple hospitalizations, compliance, skill level, functional abilities, symptoms, appetite. We talked about mental health services which Carlos Nelson endorses she has not been able to get Carlos Nelson a psychiatrist or psychotherapist. Previous PC SW consult requested to assist with coordinating care with mental health services. I visited Carlos Nelson in his group home. Carlos Nelson was ambulatory, no recent falls. Carlos Nelson endorses he is feeling alright. We talked about his albuterol inhaler which he used during PC visit. We talked about symptoms of shortness of  breath. We talked about when to use rescue inhaler. We talked about nebulizer. Carlos Nelson endorses he uses the nebullizer for a few minutes more than 6 times a day, never completes a full treatment. Education done on rescue inhaler, nebulizer treatments, how to appropriately administer his treatments. We talked about overuse with high risk for rehospitalization. We talked about inappropriate use with risks using medication outside the prescribed parameters. We talked about importance of compliance. Stressed the importance of using rescue inhaler and nebulizer as prescribed. We talked about chest tightness which he currently is not experiencing. We talked about smoking cessation which he declined. We talked about his appetite. We talked about residing at group home. Mr. Pound endorses he maybe moving to another group home. We talked about his current situation, he gets along with his room-mate who is currently in the hospital. We talked about his daily routine. Mr. Cardell speaks fast, low and often times difficult to understand. We talked about role pc in poc. Medical goals reviewed. Follow-up PC visit scheduled. Reached out to [facility] SW to revisit schedule mental health appointments. Therapeutic listening, emotional support provided. Mr. Hunsinger endorses he was thankful for [facility] visit today.  History obtained from review of EMR, discussion  and interview with Carlos Nelson, group home caregiver and  Mr. Rokosz.  I reviewed available labs, medications, imaging, studies and related documents from the EMR.  Records reviewed and summarized above.   ROS  Full 14 system review of systems performed and negative with exception of: as per HPI.  Physical Exam: Constitutional: NAD General: pleasant male,  EYES:  lids intact ENMT:  oral mucous membranes moist CV: S1S2, RRR Pulmonary: few wheezes, no increased work of breathing, no cough, room air Abdomen: intake 100%, soft and non tender GU: deferred MSK:  ambulatory Skin: warm and dry, no rashes or wounds on  visible skin Neuro:  no generalized weakness,  mild cognitive impairment Psych: non-anxious affect, A and O x 3  Questions and concerns were addressed. The patient/group home  was encouraged to call with questions and/or concerns. My contact information was provided. Provided general support and encouragement, no other unmet needs identified  Thank you for the opportunity to participate in the care of Mr. Graumann.  The palliative care team will continue to follow. Please call our office at [phone] if we can be of additional assistance.   This chart was dictated using voice recognition software. Despite best efforts to proofread, errors can occur which can change the documentation meaning.   Aerianna Losey Z Jane Birkel, NP , MSN, ACHPN  COVID-19 PATIENT SCREENING TOOL Asked and negative response unless otherwise noted:   Have you had symptoms of covid, tested positive or been in contact with someone with symptoms/positive test in the past 5-10 days? NO

## 2020-05-30 ENCOUNTER — Telehealth: Payer: Self-pay

## 2020-05-30 NOTE — Telephone Encounter (Signed)
PC SW outreached [facility] [place] to make referral for patient.   Referral unable to be completed as ACT only accepts clients with active Medicaid. Patient ony has Medicare at this time. Caregiver is working on getting patients Medicaid re-instated.

## 2020-05-30 NOTE — Telephone Encounter (Signed)
05/30/20 @ 10 AM: Palliative care SW outreached patients caregiver, Angelena Sole, per [facility] NP - C. Gusler to assess psych needs.  Caregiver shared that patient has a NP that manages his medications. Caregiver is need of psych services for patient, as patient only has Medicare as a payer and as not been successful with connecting with a psych p[roovder that only accepts Medicare. Caregiver shared that she is working on getting patients Medicaid ([phone] [id number]) re-instated.  Caregiver has not outreached ACT services to inquire if they will accept patient as a client. SW made mental health referral to [facility] provide psychotherapy services.  Caregiver had no other concerns/needs of SW at this time.

## 2020-05-30 NOTE — Progress Notes (Signed)
[facility] has accepted the referral for Carlos Nelson. [facility] will match Carlos Nelson to mental health treatment and outreach patient/caregiver with contact information.

## 2020-05-31 NOTE — Congregational Nurse Program (Signed)
  Dept: [place] Nurse Program Note  Date of Encounter: 05/31/2020 Client in for biweekly blood pressure check, reports taking his BP meds prior to coming this morning. BP 158/90, 111, oxygen saturation 96% on Room air. No new concerns. Dyspnea improved at visit today. Support and reassurance given, client is hoping to move from his current group home. He reports his sister is helping him obtain his birth certificate from [facility] so that he can get his ID.   Past Medical History: Past Medical History:  Diagnosis Date  . Chronic respiratory failure with hypoxia ([place])   . COPD (chronic obstructive pulmonary disease) ([place])   . Hypertension   . Schizophrenia ([place])     Encounter Details:  CNP Questionnaire - 05/31/20 0900      Questionnaire   Do you give verbal consent to treat you today? Yes    Visit Setting Church or Organization    Location Patient Served At [facility]    Patient Status Not Applicable    Medical Provider Yes    Insurance Medicaid;Medicare    Intervention Assess (including screenings)    Transportation Need transportation assistance    Referrals Other

## 2020-06-04 ENCOUNTER — Other Ambulatory Visit: Payer: Self-pay

## 2020-06-04 ENCOUNTER — Emergency Department
Admission: EM | Admit: 2020-06-04 | Discharge: 2020-06-04 | Disposition: A | Discharge status: Home / Self Care | Payer: Medicare Other | Attending: Emergency Medicine | Admitting: Emergency Medicine

## 2020-06-04 ENCOUNTER — Emergency Department: Payer: Medicare Other

## 2020-06-04 DIAGNOSIS — Z20822 Contact with and (suspected) exposure to covid-19: Secondary | ICD-10-CM | POA: Diagnosis not present

## 2020-06-04 DIAGNOSIS — F17210 Nicotine dependence, cigarettes, uncomplicated: Secondary | ICD-10-CM | POA: Insufficient documentation

## 2020-06-04 DIAGNOSIS — R0602 Shortness of breath: Secondary | ICD-10-CM | POA: Insufficient documentation

## 2020-06-04 DIAGNOSIS — I10 Essential (primary) hypertension: Secondary | ICD-10-CM | POA: Diagnosis not present

## 2020-06-04 DIAGNOSIS — J449 Chronic obstructive pulmonary disease, unspecified: Secondary | ICD-10-CM | POA: Diagnosis not present

## 2020-06-04 DIAGNOSIS — I2510 Atherosclerotic heart disease of native coronary artery without angina pectoris: Secondary | ICD-10-CM | POA: Diagnosis not present

## 2020-06-04 DIAGNOSIS — Z79899 Other long term (current) drug therapy: Secondary | ICD-10-CM | POA: Insufficient documentation

## 2020-06-04 LAB — CBC WITH DIFFERENTIAL/PLATELET
Abs Immature Granulocytes: 0.11 10*3/uL — ABNORMAL HIGH (ref 0.00–0.07)
Basophils Absolute: 0 10*3/uL (ref 0.0–0.1)
Basophils Relative: 0 %
Eosinophils Absolute: 0.1 10*3/uL (ref 0.0–0.5)
Eosinophils Relative: 0 %
HCT: 45.4 % (ref 39.0–52.0)
Hemoglobin: 14.8 g/dL (ref 13.0–17.0)
Immature Granulocytes: 1 %
Lymphocytes Relative: 7 %
Lymphs Abs: 1 10*3/uL (ref 0.7–4.0)
MCH: 29.8 pg (ref 26.0–34.0)
MCHC: 32.6 g/dL (ref 30.0–36.0)
MCV: 91.3 fL (ref 80.0–100.0)
Monocytes Absolute: 0.5 10*3/uL (ref 0.1–1.0)
Monocytes Relative: 3 %
Neutro Abs: 12.9 10*3/uL — ABNORMAL HIGH (ref 1.7–7.7)
Neutrophils Relative %: 89 %
Platelets: 226 10*3/uL (ref 150–400)
RBC: 4.97 MIL/uL (ref 4.22–5.81)
RDW: 14.1 % (ref 11.5–15.5)
WBC: 14.6 10*3/uL — ABNORMAL HIGH (ref 4.0–10.5)
nRBC: 0 % (ref 0.0–0.2)

## 2020-06-04 LAB — URINALYSIS, COMPLETE (UACMP) WITH MICROSCOPIC
Bacteria, UA: NONE SEEN
Bilirubin Urine: NEGATIVE
Glucose, UA: NEGATIVE mg/dL
Hgb urine dipstick: NEGATIVE
Ketones, ur: NEGATIVE mg/dL
Leukocytes,Ua: NEGATIVE
Nitrite: NEGATIVE
Protein, ur: NEGATIVE mg/dL
Specific Gravity, Urine: 1.003 — ABNORMAL LOW (ref 1.005–1.030)
Squamous Epithelial / HPF: NONE SEEN (ref 0–5)
WBC, UA: NONE SEEN WBC/hpf (ref 0–5)
pH: 7 (ref 5.0–8.0)

## 2020-06-04 LAB — BASIC METABOLIC PANEL
Anion gap: 8 (ref 5–15)
BUN: 10 mg/dL (ref 8–23)
CO2: 31 mmol/L (ref 22–32)
Calcium: 9.6 mg/dL (ref 8.9–10.3)
Chloride: 101 mmol/L (ref 98–111)
Creatinine, Ser: 0.72 mg/dL (ref 0.61–1.24)
GFR, Estimated: >60 mL/min (ref >60)
Glucose, Bld: 121 mg/dL — ABNORMAL HIGH (ref 70–99)
Potassium: 4.4 mmol/L (ref 3.5–5.1)
Sodium: 140 mmol/L (ref 135–145)

## 2020-06-04 LAB — RESP PANEL BY RT-PCR (FLU A&B, COVID) ARPGX2
Influenza A by PCR: NEGATIVE
Influenza B by PCR: NEGATIVE
SARS Coronavirus 2 by RT PCR: NEGATIVE

## 2020-06-04 LAB — BRAIN NATRIURETIC PEPTIDE: B Natriuretic Peptide: 24.8 pg/mL (ref 0.0–100.0)

## 2020-06-04 MED ORDER — IPRATROPIUM-ALBUTEROL 0.5-2.5 (3) MG/3ML IN SOLN
3.0000 mL | Freq: Once | RESPIRATORY_TRACT | Status: AC
  Administered 2020-06-04: 3 mL via RESPIRATORY_TRACT
  Filled 2020-06-04: qty 3

## 2020-06-04 NOTE — ED Provider Notes (Signed)
[facility] Emergency Department Provider Note  ____________________________________________   Event Date/Time   First MD Initiated Contact with Patient 06/04/20 1228     (approximate)  I have reviewed the triage vital signs and the nursing notes.   HISTORY  Chief Complaint No chief complaint on file.   HPI Carlos Nelson is a 70 y.o. male with a history of COPD, cigarette smoker, hypertension, and schizophrenia presents to the emergency department for treatment and evaluation of shortness of breath. Symptoms have been worse over the past 2 weeks. He states that he is using his inhaler, but it's not helping like it used to. He denies chest pain.   Past Medical History:  Diagnosis Date  . Chronic respiratory failure with hypoxia ([place])   . COPD (chronic obstructive pulmonary disease) ([place])   . Hypertension   . Schizophrenia [facility])     Patient Active Problem List   Diagnosis Date Noted  . Sepsis ([place]) 05/21/2020  . Chest pain 05/21/2020  . Acute respiratory failure with hypoxia and hypercapnia (H[facility])   . Protein-calorie malnutrition, severe 03/18/2020  . COPD with acute exacerbation ([place]) 03/10/2020  . Right-sided chest pain 03/10/2020  . HLD (hyperlipidemia) 01/18/2020  . CAD (coronary artery disease) 01/18/2020  . Hypertensive urgency 01/18/2020  . Tobacco abuse 01/18/2020  . Scrotum pain   . Transaminitis   . Demand ischemia ([place]) 06/11/2019  . Malnutrition of moderate degree 06/10/2019  . NSTEMI (non-ST elevated myocardial infarction) ([place]) 06/09/2019  . Depression with anxiety 06/09/2019  . Pleuritic chest pain 05/20/2019  . CAP (community acquired pneumonia) 05/20/2019  . Hypertension   . COPD exacerbation ([place])   . Acute on chronic respiratory failure with hypoxia and hypercapnia ([facility])   . Altered mental status 09/25/2018  . Schizophrenia ([place])   . Tachycardia 08/21/2018  . Schizophrenia, chronic with acute exacerbation ([place])  08/17/2018  . Elevated PSA 05/24/2015    Past Surgical History:  Procedure Laterality Date  . COLONOSCOPY    . COLONOSCOPY WITH PROPOFOL N/A 12/13/2016   Procedure: COLONOSCOPY WITH PROPOFOL;  Surgeon: Lollie Sails, MD;  Location: [facility] ENDOSCOPY;  Service: Endoscopy;  Laterality: N/A;  . ESOPHAGOGASTRODUODENOSCOPY (EGD) WITH PROPOFOL N/A 12/13/2016   Procedure: ESOPHAGOGASTRODUODENOSCOPY (EGD) WITH PROPOFOL;  Surgeon: Lollie Sails, MD;  Location[facility] ENDOSCOPY;  Service: Endoscopy;  Laterality: N/A;  . left arm surgery      Prior to Admission medications   Medication Sig Start Date End Date Taking? Authorizing Provider  albuterol (VENTOLIN HFA) 108 (90 Base) MCG/ACT inhaler Inhale 2 by mouth every 4 hours as needed for wheezing, cough, and/or shortness of breath 05/23/20   Rai, Ripudeep K, MD  amlodipine-olmesartan (AZOR) 10-20 MG tablet Take 1 tablet by mouth daily. 03/23/20   Loel Dubonnet, NP  ARIPiprazole (ABILIFY) 30 MG tablet Take 30 mg by mouth daily. 01/29/20   [provider]  aspirin EC 81 MG EC tablet Take 1 tablet (81 mg total) by mouth daily. Swallow whole. 05/24/20   Rai, Ripudeep Raliegh Ip, MD  dextromethorphan-guaiFENesin (MUCINEX DM) 30-600 MG 12hr tablet Take 1 tablet by mouth 2 (two) times daily as needed for cough. 05/23/20   Rai, Ripudeep K, MD  donepezil (ARICEPT) 5 MG tablet Take 5 mg by mouth at bedtime.    [provider]  ipratropium-albuterol (DUONEB) 0.5-2.5 (3) MG/3ML SOLN Take 3 mLs by nebulization every 4 (four) hours for 14 days. Patient taking differently: Take 3 mLs by nebulization every 4 (four)  hours as needed. 03/11/20 03/25/20  Fritzi Mandes, MD  mometasone-formoterol [facility]) 100-5 MCG/ACT AERO Inhale 2 puffs into the lungs 2 (two) times daily. 05/23/20   Rai, Vernelle Emerald, MD  nitroGLYCERIN (NITROSTAT) 0.4 MG SL tablet Place 1 tablet (0.4 mg total) under the tongue every 5 (five) minutes as needed for chest pain. 05/23/20   Rai, Ripudeep K, MD   pantoprazole (PROTONIX) 20 MG tablet Take 1 tablet (20 mg total) by mouth daily. 12/18/19 12/17/20  Lavonia Drafts, MD  predniSONE (DELTASONE) 10 MG tablet Prednisone dosing: Take  Prednisone 40mg  (4 tabs) x 3 days, then taper to 30mg  (3 tabs) x 3 days, then 20mg  (2 tabs) x 3days, then 10mg  (1 tab) x 3days, then OFF. 05/23/20   Rai, Ripudeep K, MD  QUEtiapine (SEROQUEL) 200 MG tablet Take 3 tablets (600 mg total) by mouth at bedtime. 02/03/20   Harvest Dark, MD  traZODone (DESYREL) 100 MG tablet Take 100 mg by mouth at bedtime. 02/24/20   [provider]    Allergies Ace inhibitors  Family History  Problem Relation Age of Onset  . Prostate cancer Neg Hx   . Bladder Cancer Neg Hx     Social History Social History   Tobacco Use  . Smoking status: Current Every Day Smoker    Packs/day: 1.00    Types: Cigarettes  . Smokeless tobacco: Never Used  Substance Use Topics  . Alcohol use: Yes    Alcohol/week: 0.0 standard drinks    Comment: beer  . Drug use: Not Currently    Types: Marijuana    Review of Systems  Constitutional: No fever/chills. Eyes: No visual changes. ENT: No sore throat. Cardiovascular: Negataive for chest pain. Negataive for pleuritic pain. Negative for palpitations. Negative for leg pain. Respiratory: Positive for shortness of breath. Gastrointestinal: Negative for abdominal pain. No nausea, no vomiting.  No diarrhea.  No constipation. Genitourinary: Negative for dysuria. Musculoskeletal: Negative for back pain.  Skin: Negative for rash, lesion, wound. Neurological: Negative for headaches, focal weakness or numbness. ____________________________________________   PHYSICAL EXAM:  VITAL SIGNS: ED Triage Vitals  Enc Vitals Group     BP 06/04/20 1201 (!) 148/89     Pulse Rate 06/04/20 1201 (!) 110     Resp 06/04/20 1201 16     Temp 06/04/20 1201 97.8 F (36.6 C)     Temp Source 06/04/20 1201 Oral     SpO2 06/04/20 1200 96 %     Weight  06/04/20 1200 131 lb (59.4 kg)     Height 06/04/20 1200 5\' 1"  (1.549 m)     Head Circumference --      Peak Flow --      Pain Score 06/04/20 1200 0     Pain Loc --      Pain Edu? --      Excl. in [place]? --     Constitutional: Alert and oriented. Chronically ill appearing and in no acute distress. Normal mental status. Eyes: Conjunctivae are normal. PERRL. Head: Atraumatic. Nose: No congestion/rhinnorhea. Mouth/Throat: Mucous membranes are moist.  Oropharynx non-erythematous. Tongue normal in size and color. Neck: No stridor. No carotid bruit appreciated on exam. Hematological/Lymphatic/Immunilogical: No cervical lymphadenopathy. Cardiovascular: Normal rate, regular rhythm. Grossly normal heart sounds.  Good peripheral circulation. Respiratory: Normal respiratory effort.  No retractions. Lungs CTAB. Gastrointestinal: Soft and nontender. No distention. No abdominal bruits. No CVA tenderness. Genitourinary: Exam deferred. Musculoskeletal: No lower extremity tenderness. No edema of extremities. Neurologic:  Normal speech and language.  No gross focal neurologic deficits are appreciated. Skin:  Skin is warm, dry and intact. No rash noted. Psychiatric: Mood and affect are normal. Speech and behavior are normal.  ____________________________________________   LABS (all labs ordered are listed, but only abnormal results are displayed)  Labs Reviewed  CBC WITH DIFFERENTIAL/PLATELET - Abnormal; Notable for the following components:      Result Value   WBC 14.6 (*)    Neutro Abs 12.9 (*)    Abs Immature Granulocytes 0.11 (*)    All other components within normal limits  BASIC METABOLIC PANEL - Abnormal; Notable for the following components:   Glucose, Bld 121 (*)    All other components within normal limits  URINALYSIS, COMPLETE (UACMP) WITH MICROSCOPIC - Abnormal; Notable for the following components:   Color, Urine STRAW (*)    APPearance CLEAR (*)    Specific Gravity, Urine 1.003 (*)     All other components within normal limits  RESP PANEL BY RT-PCR (FLU A&B, COVID) ARPGX2  BRAIN NATRIURETIC PEPTIDE  ____________________________________________  RADIOLOGY  ED MD interpretation: Chest x-ray shows no acute cardiopulmonary disease.  I, Sherrie George, personally viewed and evaluated these images (plain radiographs) as part of my medical decision making, as well as reviewing the written report by the radiologist.  Official radiology report(s): DG Chest 2 View  Result Date: 06/04/2020 CLINICAL DATA:  Shortness of breath. EXAM: CHEST - 2 VIEW COMPARISON:  None. FINDINGS: Heart size is normal. Lungs are hyperinflated. Lungs are clear. No edema or effusion is present. No focal airspace disease is present. Axial skeleton is unremarkable. IMPRESSION: 1. No acute cardiopulmonary disease. 2. Hyperinflation. Electronically Signed   By: San Morelle M.D.   On: 06/04/2020 13:14    ____________________________________________   PROCEDURES  Procedure(s) performed: None  Procedures  Critical Care performed: No  ____________________________________________   INITIAL IMPRESSION / ASSESSMENT AND PLAN   As part of my medical decision making, I reviewed the following data within the electronic MEDICAL RECORD NUMBER Notes from prior ED visits; last admission note.   ____________________________________________  Differential diagnosis includes, but not limited to:  Differential includes, but is not limited to, viral syndrome, bronchitis including COPD exacerbation, pneumonia, reactive airway disease including asthma, CHF including exacerbation with or without pulmonary/interstitial edema, pneumothorax, ACS, thoracic trauma, and pulmonary embolism.   ED COURSE  12:46 PM:  Spoke with Tomi Bamberger at the group home. She is unsure why he called EMS. She reports that he has not complained of anything that she is aware of.  1621PM: Patient ambulatory in the emergency department  without complaint or obvious shortness of breath.  He does continue to be slightly tachycardic at 104 however this appears to be at or near his baseline.  Chest x-ray shows no acute cardiopulmonary abnormality.  He does have a mild leukocytosis at 14.6 which is down from 22.2 12 days ago.  Will await urinalysis results, but as long as there is no indication of infection he will be discharged home with encouragement to follow-up with primary care or pulmonology.   ED ECG REPORT I, Theresea Trautmann, FNP-BC personally viewed and interpreted this ECG.   Date: 06/04/2020  EKG Time: 1607  Rate: 99  Rhythm: unchanged from previous tracings, normal sinus rhythm  Axis: normal  Intervals:none  ST&T Change: no ST elevation    FINAL CLINICAL IMPRESSION(S) / ED DIAGNOSES  Final diagnoses:  Shortness of breath     ED Discharge Orders    None  DIOR PALOMAREZ was evaluated in Emergency Department on 06/04/2020 for the symptoms described in the history of present illness. He was evaluated in the context of the global COVID-19 pandemic, which necessitated consideration that the patient might be at risk for infection with the SARS-CoV-2 virus that causes COVID-19. Institutional protocols and algorithms that pertain to the evaluation of patients at risk for COVID-19 are in a state of rapid change based on information released by regulatory bodies including the CDC and federal and state organizations. These policies and algorithms were followed during the patient's care in the ED.   Note:  This document was prepared using Dragon voice recognition software and may include unintentional dictation errors.   Victorino Dike, FNP 06/04/20 [place] E[place]    Carrie Mew, MD 06/05/20 [id number]

## 2020-06-04 NOTE — ED Notes (Signed)
Patient transported to X-ray 

## 2020-06-04 NOTE — ED Notes (Addendum)
Pt still waiting on taxi per Network engineer. Taxi service given instructions to call ED when arrived. Tomi Bamberger from group home made aware.

## 2020-06-04 NOTE — ED Triage Notes (Addendum)
Pt BIBA from [facility] for mobility issues. Pt ambulates independently upon arrival. Pt talking in full sentences with no SHOB. Pt states he used to be able to walk to the end of his street and he hasnt been able to for 2 1/2 years. Hx of COPD and shizophrenia. Pt noncompliant with medications.

## 2020-06-04 NOTE — Discharge Instructions (Signed)
Use your albuterol inhaler every 4-6 hours if needed for shortness of breath.  Follow-up with your primary care provider or specialist for further work-up of your shortness of breath.

## 2020-06-04 NOTE — ED Notes (Signed)
Pateint waiting on taxi to arrive. Taxi will transport pt to [place].

## 2020-06-06 NOTE — Progress Notes (Deleted)
Cardiology Office Note    Date:  06/06/2020   ID:  Carlos Nelson, Carlos Nelson 1950-02-11, MRN [id number]  PCP:  [facility]  Cardiologist:  Nelva Bush, MD  Electrophysiologist:  None   Chief Complaint: Hospital follow up  History of Present Illness:   Carlos Nelson is a 70 y.o. male with history of schizophrenia, HTN, HLD, chronic hypoxic and hypercapnic respiratory failure noncompliant with supplemental oxygen, COPD with frequent exacerbation, tobacco use, anxiety, and medical nonadherence who presents for hospital follow-up as outlined below.  He was admitted in 05/2019 for COPD exacerbation.  Cardiology was consulted for demand ischemia.  He declined LHC.  Echo showed an EF of 60 to 65%, mild LVH, normal RV systolic function and ventricular cavity size, and no significant valvular abnormalities.  Lexiscan MPI during that admission showed no significant ischemia with CT attenuation corrected images showing no significant coronary artery calcification or aortic atherosclerosis.  Overall this was a low risk scan.  Following this admission he was lost to follow-up until he was seen in the office in 03/2020, at which time it was noted he had been seen in the ED and hospitalized multiple times for recurrent COPD exacerbations.  It was noted the patient was nonadherent to medications.  He was residing in a group home.  He continued to note exertional dyspnea felt to be related to underlying COPD and atypical chest pain with symptoms felt to be noncardiac in etiology.  Since that office visit in 03/2020, he has had 4 ED visits and 3 hospitalizations, primarily for COPD exacerbations.  He was most recently admitted to the hospital from 5/7 through 5/9 for acute on chronic hypoxic and hypercapnic respiratory failure secondary to COPD exacerbation in the context of medication and supplemental oxygen use nonadherence as well as ongoing tobacco use.  He required BiPAP which was  subsequently transitioned back to supplemental oxygen via nasal cannula.  He was treated with IV steroids, nebulizers, and antibiotics.  High-sensitivity troponin 17 with a delta and peak of 20.  D-dimer negative.  BNP normal.  Echo showed an EF of 60 to 65%, no regional wall motion abnormalities, moderate LVH, indeterminate LV diastolic function parameters, normal RV systolic function and ventricular cavity size, normal PASP, and no significant valvular abnormalities.  Following this hospital admission he was seen in the ED on 06/04/2020 for continued dyspnea with the group home being unaware of recurrent symptoms.  Work-up was largely unrevealing and he was advised to follow-up with PCP/pulmonology.  ***   Labs independently reviewed: 05/2020 - potassium 4.4, BUN 10, SCr 0.72, HGB 14.8, PLT 226, TC 172, TG 102, HDL 90, LDL 62, A1c 5.9, magnesium 1.9, albumin 4.0, AST 43, ALT 45 10/2018 - TSH normal  Past Medical History:  Diagnosis Date  . Chronic respiratory failure with hypoxia ([place])   . COPD (chronic obstructive pulmonary disease) ([place])   . Hypertension   . Schizophrenia [facility])     Past Surgical History:  Procedure Laterality Date  . COLONOSCOPY    . COLONOSCOPY WITH PROPOFOL N/A 12/13/2016   Procedure: COLONOSCOPY WITH PROPOFOL;  Surgeon: Lollie Sails, MD;  Location[facility] ENDOSCOPY;  Service: Endoscopy;  Laterality: N/A;  . ESOPHAGOGASTRODUODENOSCOPY (EGD) WITH PROPOFOL N/A 12/13/2016   Procedure: ESOPHAGOGASTRODUODENOSCOPY (EGD) WITH PROPOFOL;  Surgeon: Lollie Sails, MD;  Location: [facility] ENDOSCOPY;  Service: Endoscopy;  Laterality: N/A;  . left arm surgery      Current Medications: No outpatient medications have been marked as  taking for the 06/11/2020 encounter (Appointment) with Rise Mu, PA-C.    Allergies:   Ace inhibitors   Social History   Socioeconomic History  . Marital status: Single    Spouse name: Not on file  . Number of children: Not on file  .  Years of education: Not on file  . Highest education level: Not on file  Occupational History  . Not on file  Tobacco Use  . Smoking status: Current Every Day Smoker    Packs/day: 1.00    Types: Cigarettes  . Smokeless tobacco: Never Used  Substance and Sexual Activity  . Alcohol use: Yes    Alcohol/week: 0.0 standard drinks    Comment: beer  . Drug use: Not Currently    Types: Marijuana  . Sexual activity: Not on file  Other Topics Concern  . Not on file  Social History Narrative  . Not on file   Social Determinants of Health   Financial Resource Strain: Not on file  Food Insecurity: Not on file  Transportation Needs: Not on file  Physical Activity: Not on file  Stress: Not on file  Social Connections: Not on file     Family History:  The patient's family history is negative for Prostate cancer and Bladder Cancer.  ROS:   ROS   EKGs/Labs/Other Studies Reviewed:    Studies reviewed were summarized above. The additional studies were reviewed today:  2D echo 05/23/2020: 1. Left ventricular ejection fraction, by estimation, is 60 to 65%. The  left ventricle has normal function. The left ventricle has no regional  wall motion abnormalities. There is moderate left ventricular hypertrophy.  Left ventricular diastolic  parameters are indeterminate.  2. Right ventricular systolic function is normal. The right ventricular  size is normal. There is normal pulmonary artery systolic pressure. The  estimated right ventricular systolic pressure is 05.3 mmHg. __________  2D echo 05/2019: 1. Left ventricular ejection fraction, by estimation, is 60 to 65%. The  left ventricle has normal function. Left ventricular endocardial border  not optimally defined to evaluate regional wall motion. There is mild left  ventricular hypertrophy. Left  ventricular diastolic function could not be evaluated.  2. Right ventricular systolic function is normal. The right ventricular  size is  normal. Mildly increased right ventricular wall thickness.  Tricuspid regurgitation signal is inadequate for assessing PA pressure.  3. The mitral valve is grossly normal. Unable to accurately assess mitral  valve regurgitation.  4. The aortic valve is tricuspid. Aortic valve regurgitation is not  well-assessed. Aortic stenosis is not well-assessed.  5. The inferior vena cava is normal in size with greater than 50%  respiratory variability, suggesting right atrial pressure of 3 mmHg. __________  [facility] Adam MPI 05/2019: Pharmacological myocardial perfusion imaging study with no significant  ischemia Normal wall motion, EF estimated at 63% No EKG changes concerning for ischemia at peak stress or in recovery. CT attenuation correction images no significant coronary calcifucation, no aortic atherosclerosis. Low risk scan ___________  Treadmill MPI 11/2000: 1. MILD SEPTAL HYPOKINESIS. LEFT VENTRICULAR RESTING EJECTION FRACTION OF 53%.  2. NO PERFUSION DEFECT TO SUGGEST SCAR OR INDUCIBLE ISCHEMIA.   EKG:  EKG is ordered today.  The EKG ordered today demonstrates ***  Recent Labs: 05/21/2020: ALT 45; Magnesium 1.9 06/04/2020: B Natriuretic Peptide 24.8; BUN 10; Creatinine, Ser 0.72; Hemoglobin 14.8; Platelets 226; Potassium 4.4; Sodium 140  Recent Lipid Panel    Component Value Date/Time   CHOL 172 05/22/2020 0410   CHOL  134 11/17/2013 0618   TRIG 102 05/22/2020 0410   TRIG 135 11/17/2013 0618   HDL 90 05/22/2020 0410   HDL 56 11/17/2013 0618   CHOLHDL 1.9 05/22/2020 0410   VLDL 20 05/22/2020 0410   VLDL 27 11/17/2013 0618   LDLCALC 62 05/22/2020 0410   LDLCALC 51 11/17/2013 0618    PHYSICAL EXAM:    VS:  There were no vitals taken for this visit.  BMI: There is no height or weight on file to calculate BMI.  Physical Exam  Wt Readings from Last 3 Encounters:  06/04/20 131 lb (59.4 kg)  05/22/20 126 lb 11.2 oz (57.5 kg)  05/09/20 133 lb 6.4 oz (60.5 kg)     ASSESSMENT  & PLAN:   1. HTN: Blood pressure ***  2. Chronic hypoxic and hypercapnic respiratory failure with underlying COPD with frequent exacerbations:  3. Schizophrenia:  4. Tobacco use:  Disposition: F/u with Dr. Saunders Revel or an APP in ***.   Medication Adjustments/Labs and Tests Ordered: Current medicines are reviewed at length with the patient today.  Concerns regarding medicines are outlined above. Medication changes, Labs and Tests ordered today are summarized above and listed in the Patient Instructions accessible in Encounters.   SignedChristell Faith, PA-C 06/06/2020 12:07 PM     [address] [place], [place] [address] [phone]

## 2020-06-07 ENCOUNTER — Inpatient Hospital Stay
Admission: EM | Admit: 2020-06-07 | Discharge: 2020-07-15 | DRG: 208 | Disposition: E | Payer: Medicare Other | Attending: Internal Medicine | Admitting: Internal Medicine

## 2020-06-07 ENCOUNTER — Encounter: Payer: Self-pay | Admitting: Radiology

## 2020-06-07 ENCOUNTER — Emergency Department: Payer: Medicare Other

## 2020-06-07 ENCOUNTER — Other Ambulatory Visit: Payer: Self-pay

## 2020-06-07 ENCOUNTER — Ambulatory Visit: Payer: Medicare Other | Admitting: Physician Assistant

## 2020-06-07 DIAGNOSIS — J432 Centrilobular emphysema: Secondary | ICD-10-CM | POA: Diagnosis not present

## 2020-06-07 DIAGNOSIS — J441 Chronic obstructive pulmonary disease with (acute) exacerbation: Secondary | ICD-10-CM

## 2020-06-07 DIAGNOSIS — Z978 Presence of other specified devices: Secondary | ICD-10-CM

## 2020-06-07 DIAGNOSIS — R0602 Shortness of breath: Secondary | ICD-10-CM

## 2020-06-07 DIAGNOSIS — F209 Schizophrenia, unspecified: Secondary | ICD-10-CM

## 2020-06-07 DIAGNOSIS — I10 Essential (primary) hypertension: Secondary | ICD-10-CM | POA: Diagnosis present

## 2020-06-07 DIAGNOSIS — Z20822 Contact with and (suspected) exposure to covid-19: Secondary | ICD-10-CM | POA: Diagnosis present

## 2020-06-07 DIAGNOSIS — I2510 Atherosclerotic heart disease of native coronary artery without angina pectoris: Secondary | ICD-10-CM | POA: Diagnosis present

## 2020-06-07 DIAGNOSIS — Z01818 Encounter for other preprocedural examination: Secondary | ICD-10-CM

## 2020-06-07 DIAGNOSIS — R1084 Generalized abdominal pain: Secondary | ICD-10-CM

## 2020-06-07 DIAGNOSIS — Z7982 Long term (current) use of aspirin: Secondary | ICD-10-CM

## 2020-06-07 DIAGNOSIS — J9621 Acute and chronic respiratory failure with hypoxia: Secondary | ICD-10-CM | POA: Diagnosis present

## 2020-06-07 DIAGNOSIS — I959 Hypotension, unspecified: Secondary | ICD-10-CM | POA: Diagnosis not present

## 2020-06-07 DIAGNOSIS — Z79899 Other long term (current) drug therapy: Secondary | ICD-10-CM

## 2020-06-07 DIAGNOSIS — F17210 Nicotine dependence, cigarettes, uncomplicated: Secondary | ICD-10-CM | POA: Diagnosis present

## 2020-06-07 DIAGNOSIS — J9622 Acute and chronic respiratory failure with hypercapnia: Secondary | ICD-10-CM | POA: Diagnosis present

## 2020-06-07 DIAGNOSIS — J9601 Acute respiratory failure with hypoxia: Secondary | ICD-10-CM | POA: Diagnosis not present

## 2020-06-07 DIAGNOSIS — F0390 Unspecified dementia without behavioral disturbance: Secondary | ICD-10-CM | POA: Diagnosis not present

## 2020-06-07 DIAGNOSIS — K219 Gastro-esophageal reflux disease without esophagitis: Secondary | ICD-10-CM | POA: Diagnosis present

## 2020-06-07 DIAGNOSIS — Z66 Do not resuscitate: Secondary | ICD-10-CM | POA: Diagnosis present

## 2020-06-07 DIAGNOSIS — R109 Unspecified abdominal pain: Secondary | ICD-10-CM

## 2020-06-07 DIAGNOSIS — Z515 Encounter for palliative care: Secondary | ICD-10-CM

## 2020-06-07 DIAGNOSIS — Z0189 Encounter for other specified special examinations: Secondary | ICD-10-CM

## 2020-06-07 DIAGNOSIS — Z4659 Encounter for fitting and adjustment of other gastrointestinal appliance and device: Secondary | ICD-10-CM

## 2020-06-07 DIAGNOSIS — Z7951 Long term (current) use of inhaled steroids: Secondary | ICD-10-CM

## 2020-06-07 DIAGNOSIS — R739 Hyperglycemia, unspecified: Secondary | ICD-10-CM | POA: Diagnosis present

## 2020-06-07 LAB — RESP PANEL BY RT-PCR (FLU A&B, COVID) ARPGX2
Influenza A by PCR: NEGATIVE
Influenza B by PCR: NEGATIVE
SARS Coronavirus 2 by RT PCR: NEGATIVE

## 2020-06-07 LAB — BASIC METABOLIC PANEL
Anion gap: 9 (ref 5–15)
Anion gap: 10 (ref 5–15)
BUN: 18 mg/dL (ref 8–23)
BUN: 17 mg/dL (ref 8–23)
CO2: 28 mmol/L (ref 22–32)
CO2: 29 mmol/L (ref 22–32)
Calcium: 9 mg/dL (ref 8.9–10.3)
Calcium: 9.5 mg/dL (ref 8.9–10.3)
Chloride: 102 mmol/L (ref 98–111)
Chloride: 100 mmol/L (ref 98–111)
Creatinine, Ser: 1.22 mg/dL (ref 0.61–1.24)
Creatinine, Ser: 1.04 mg/dL (ref 0.61–1.24)
GFR, Estimated: >60 mL/min (ref >60)
GFR, Estimated: >60 mL/min (ref >60)
Glucose, Bld: 126 mg/dL — ABNORMAL HIGH (ref 70–99)
Glucose, Bld: 175 mg/dL — ABNORMAL HIGH (ref 70–99)
Potassium: 4.4 mmol/L (ref 3.5–5.1)
Potassium: 4.7 mmol/L (ref 3.5–5.1)
Sodium: 139 mmol/L (ref 135–145)
Sodium: 139 mmol/L (ref 135–145)

## 2020-06-07 LAB — CBC
HCT: 47 % (ref 39.0–52.0)
Hemoglobin: 15.2 g/dL (ref 13.0–17.0)
MCH: 29.8 pg (ref 26.0–34.0)
MCHC: 32.3 g/dL (ref 30.0–36.0)
MCV: 92.2 fL (ref 80.0–100.0)
Platelets: 181 10*3/uL (ref 150–400)
RBC: 5.1 MIL/uL (ref 4.22–5.81)
RDW: 14.1 % (ref 11.5–15.5)
WBC: 7.8 10*3/uL (ref 4.0–10.5)
nRBC: 0 % (ref 0.0–0.2)

## 2020-06-07 LAB — CBC WITH DIFFERENTIAL/PLATELET
Abs Immature Granulocytes: 0.08 10*3/uL — ABNORMAL HIGH (ref 0.00–0.07)
Basophils Absolute: 0 10*3/uL (ref 0.0–0.1)
Basophils Relative: 0 %
Eosinophils Absolute: 0.1 10*3/uL (ref 0.0–0.5)
Eosinophils Relative: 1 %
HCT: 42.4 % (ref 39.0–52.0)
Hemoglobin: 13.7 g/dL (ref 13.0–17.0)
Immature Granulocytes: 1 %
Lymphocytes Relative: 13 %
Lymphs Abs: 1.3 10*3/uL (ref 0.7–4.0)
MCH: 29.7 pg (ref 26.0–34.0)
MCHC: 32.3 g/dL (ref 30.0–36.0)
MCV: 91.8 fL (ref 80.0–100.0)
Monocytes Absolute: 0.8 10*3/uL (ref 0.1–1.0)
Monocytes Relative: 8 %
Neutro Abs: 7.6 10*3/uL (ref 1.7–7.7)
Neutrophils Relative %: 77 %
Platelets: 200 10*3/uL (ref 150–400)
RBC: 4.62 MIL/uL (ref 4.22–5.81)
RDW: 13.9 % (ref 11.5–15.5)
WBC: 9.9 10*3/uL (ref 4.0–10.5)
nRBC: 0 % (ref 0.0–0.2)

## 2020-06-07 LAB — TROPONIN I (HIGH SENSITIVITY)
Troponin I (High Sensitivity): 17 ng/L (ref <18)
Troponin I (High Sensitivity): 14 ng/L (ref <18)

## 2020-06-07 MED ORDER — DM-GUAIFENESIN ER 30-600 MG PO TB12
1.0000 | ORAL_TABLET | Freq: Two times a day (BID) | ORAL | Status: DC | PRN
  Administered 2020-06-09 – 2020-06-10 (×2): 1 via ORAL
  Filled 2020-06-07 (×2): qty 1

## 2020-06-07 MED ORDER — SODIUM CHLORIDE 0.9 % IV SOLN: INTRAVENOUS | Status: DC

## 2020-06-07 MED ORDER — ENOXAPARIN SODIUM 40 MG/0.4ML IJ SOSY
40.0000 mg | PREFILLED_SYRINGE | INTRAMUSCULAR | Status: DC
  Administered 2020-06-07 – 2020-06-16 (×10): 40 mg via SUBCUTANEOUS
  Filled 2020-06-07 (×10): qty 0.4

## 2020-06-07 MED ORDER — TRAZODONE HCL 50 MG PO TABS: 25.0000 mg | ORAL_TABLET | Freq: Every evening | ORAL | Status: DC | PRN

## 2020-06-07 MED ORDER — PREDNISONE 20 MG PO TABS
40.0000 mg | ORAL_TABLET | Freq: Every day | ORAL | Status: DC
  Administered 2020-06-08: 40 mg via ORAL
  Filled 2020-06-07: qty 2

## 2020-06-07 MED ORDER — IPRATROPIUM BROMIDE 0.02 % IN SOLN
0.5000 mg | Freq: Once | RESPIRATORY_TRACT | Status: AC
  Administered 2020-06-07: 0.5 mg via RESPIRATORY_TRACT
  Filled 2020-06-07: qty 2.5

## 2020-06-07 MED ORDER — ONDANSETRON HCL 4 MG/2ML IJ SOLN: 4.0000 mg | Freq: Four times a day (QID) | INTRAMUSCULAR | Status: DC | PRN

## 2020-06-07 MED ORDER — DOXYCYCLINE HYCLATE 100 MG PO TABS
100.0000 mg | ORAL_TABLET | Freq: Two times a day (BID) | ORAL | Status: DC
  Administered 2020-06-07 – 2020-06-10 (×9): 100 mg via ORAL
  Filled 2020-06-07 (×8): qty 1

## 2020-06-07 MED ORDER — ASPIRIN EC 81 MG PO TBEC
81.0000 mg | DELAYED_RELEASE_TABLET | Freq: Every day | ORAL | Status: DC
  Administered 2020-06-07 – 2020-06-11 (×5): 81 mg via ORAL
  Filled 2020-06-07 (×5): qty 1

## 2020-06-07 MED ORDER — ACETAMINOPHEN 325 MG PO TABS
650.0000 mg | ORAL_TABLET | Freq: Four times a day (QID) | ORAL | Status: DC | PRN
  Administered 2020-06-11: 650 mg via ORAL
  Filled 2020-06-07 (×2): qty 2

## 2020-06-07 MED ORDER — ENOXAPARIN SODIUM 40 MG/0.4ML IJ SOSY: 40.0000 mg | PREFILLED_SYRINGE | INTRAMUSCULAR | Status: DC

## 2020-06-07 MED ORDER — METHYLPREDNISOLONE SODIUM SUCC 40 MG IJ SOLR
40.0000 mg | Freq: Four times a day (QID) | INTRAMUSCULAR | Status: AC
  Administered 2020-06-07 (×4): 40 mg via INTRAVENOUS
  Filled 2020-06-07 (×4): qty 1

## 2020-06-07 MED ORDER — TRAZODONE HCL 100 MG PO TABS
100.0000 mg | ORAL_TABLET | Freq: Every day | ORAL | Status: DC
  Administered 2020-06-07 – 2020-06-10 (×3): 100 mg via ORAL
  Filled 2020-06-07 (×4): qty 1

## 2020-06-07 MED ORDER — ALBUTEROL SULFATE (2.5 MG/3ML) 0.083% IN NEBU
2.5000 mg | INHALATION_SOLUTION | RESPIRATORY_TRACT | Status: DC | PRN
  Administered 2020-06-07 – 2020-06-10 (×8): 2.5 mg via RESPIRATORY_TRACT
  Filled 2020-06-07 (×8): qty 3

## 2020-06-07 MED ORDER — IRBESARTAN 150 MG PO TABS
150.0000 mg | ORAL_TABLET | Freq: Every day | ORAL | Status: DC
Start: 2020-06-07 — End: 2020-06-11
  Administered 2020-06-07 – 2020-06-10 (×4): 150 mg via ORAL
  Filled 2020-06-07 (×4): qty 1

## 2020-06-07 MED ORDER — PANTOPRAZOLE SODIUM 20 MG PO TBEC
20.0000 mg | DELAYED_RELEASE_TABLET | Freq: Every day | ORAL | Status: DC
  Filled 2020-06-07: qty 1

## 2020-06-07 MED ORDER — SODIUM CHLORIDE 0.9 % IV SOLN
1.0000 g | INTRAVENOUS | Status: DC
  Administered 2020-06-07: 1 g via INTRAVENOUS
  Filled 2020-06-07: qty 1
  Filled 2020-06-07: qty 10

## 2020-06-07 MED ORDER — AMLODIPINE BESYLATE 5 MG PO TABS
5.0000 mg | ORAL_TABLET | Freq: Every day | ORAL | Status: DC
  Administered 2020-06-07 – 2020-06-10 (×4): 5 mg via ORAL
  Filled 2020-06-07 (×4): qty 1

## 2020-06-07 MED ORDER — NITROGLYCERIN 0.4 MG SL SUBL
0.4000 mg | SUBLINGUAL_TABLET | SUBLINGUAL | Status: DC | PRN
  Administered 2020-06-09: 0.4 mg via SUBLINGUAL
  Filled 2020-06-07: qty 1

## 2020-06-07 MED ORDER — ARIPIPRAZOLE 15 MG PO TABS
30.0000 mg | ORAL_TABLET | Freq: Every day | ORAL | Status: DC
  Administered 2020-06-07 – 2020-06-10 (×4): 30 mg via ORAL
  Filled 2020-06-07 (×5): qty 2

## 2020-06-07 MED ORDER — IPRATROPIUM-ALBUTEROL 0.5-2.5 (3) MG/3ML IN SOLN
3.0000 mL | Freq: Four times a day (QID) | RESPIRATORY_TRACT | Status: DC
  Administered 2020-06-07: 3 mL via RESPIRATORY_TRACT
  Filled 2020-06-07 (×2): qty 3

## 2020-06-07 MED ORDER — IPRATROPIUM-ALBUTEROL 0.5-2.5 (3) MG/3ML IN SOLN
3.0000 mL | Freq: Three times a day (TID) | RESPIRATORY_TRACT | Status: DC
  Administered 2020-06-07 – 2020-06-10 (×9): 3 mL via RESPIRATORY_TRACT
  Filled 2020-06-07 (×8): qty 3

## 2020-06-07 MED ORDER — ONDANSETRON HCL 4 MG PO TABS: 4.0000 mg | ORAL_TABLET | Freq: Four times a day (QID) | ORAL | Status: DC | PRN

## 2020-06-07 MED ORDER — QUETIAPINE FUMARATE 200 MG PO TABS
600.0000 mg | ORAL_TABLET | Freq: Every day | ORAL | Status: DC
  Administered 2020-06-07 – 2020-06-10 (×3): 600 mg via ORAL
  Filled 2020-06-07: qty 3
  Filled 2020-06-07 (×4): qty 2

## 2020-06-07 MED ORDER — ALBUTEROL SULFATE (2.5 MG/3ML) 0.083% IN NEBU
5.0000 mg | INHALATION_SOLUTION | Freq: Once | RESPIRATORY_TRACT | Status: AC
  Administered 2020-06-07: 5 mg via RESPIRATORY_TRACT
  Filled 2020-06-07: qty 6

## 2020-06-07 MED ORDER — GUAIFENESIN ER 600 MG PO TB12
600.0000 mg | ORAL_TABLET | Freq: Two times a day (BID) | ORAL | Status: DC
  Administered 2020-06-07 – 2020-06-10 (×8): 600 mg via ORAL
  Filled 2020-06-07 (×8): qty 1

## 2020-06-07 MED ORDER — IPRATROPIUM-ALBUTEROL 0.5-2.5 (3) MG/3ML IN SOLN
3.0000 mL | Freq: Once | RESPIRATORY_TRACT | Status: AC
  Administered 2020-06-07: 3 mL via RESPIRATORY_TRACT
  Filled 2020-06-07: qty 3

## 2020-06-07 MED ORDER — ACETAMINOPHEN 650 MG RE SUPP: 650.0000 mg | Freq: Four times a day (QID) | RECTAL | Status: DC | PRN

## 2020-06-07 MED ORDER — PANTOPRAZOLE SODIUM 40 MG PO PACK
20.0000 mg | PACK | Freq: Every day | ORAL | Status: DC
  Administered 2020-06-07 – 2020-06-09 (×2): 20 mg via ORAL
  Filled 2020-06-07 (×5): qty 20

## 2020-06-07 MED ORDER — DONEPEZIL HCL 5 MG PO TABS
5.0000 mg | ORAL_TABLET | Freq: Every day | ORAL | Status: DC
  Administered 2020-06-07 – 2020-06-10 (×4): 5 mg via ORAL
  Filled 2020-06-07 (×4): qty 1

## 2020-06-07 MED ORDER — MAGNESIUM HYDROXIDE 400 MG/5ML PO SUSP
30.0000 mL | Freq: Every day | ORAL | Status: DC | PRN
  Administered 2020-06-08: 30 mL via ORAL
  Filled 2020-06-07: qty 30

## 2020-06-07 NOTE — ED Triage Notes (Signed)
Pt arrives to ED from home via [facility] with c/c of SoB beginning at approx 11 pm. Pt states he ran out of his inhailer 2 days ago. Pt has Hx of COPD and HTN. EMS reports transport vitals of 143/78, p 116, O2 sat 98% on 4L via nasal canula. Pt received 1 duoneb, 1 albuterol neb, and 125 solumedrol IV. EMS placed 18G in left forearm. Upon arrival, pt A&Ox4, NAD. Pt states he is breathing easier after Tx on ambulance.

## 2020-06-07 NOTE — ED Notes (Signed)
Pt O2 sat 88% on RA while ambulating.

## 2020-06-07 NOTE — Care Management Obs Status (Signed)
[facility] NOTIFICATION   Patient Details  Name: Carlos Nelson MRN: [id number] Date of Birth: [birth year]   Medicare Observation Status Notification Given:  Yes    Candie Chroman, LCSW 05/30/2020, 4:27 PM

## 2020-06-07 NOTE — Care Management CC44 (Signed)
Condition Code 44 Documentation Completed  Patient Details  Name: Carlos Nelson MRN: [id number] Date of Birth: Sep 15, 1950   Condition Code 44 given:  Yes Patient signature on Condition Code 44 notice:  Yes Documentation of 2 MD's agreement:  Yes Code 44 added to claim:  Yes    Candie Chroman, LCSW 05/31/2020, 4:27 PM

## 2020-06-07 NOTE — Progress Notes (Signed)
[facility] NAME: Carlos Nelson    MR#:  [id number]  DATE OF BIRTH:  1950-05-03  SUBJECTIVE:   Denies any complaints. Came in with increasing shortness of breath. Continues to smoke. Does not wear oxygen at home returned his equipment. Has some dry intermittent productive cough. No fever. REVIEW OF SYSTEMS:   Review of Systems  Constitutional: Negative for chills, fever and weight loss.  HENT: Negative for ear discharge, ear pain and nosebleeds.   Eyes: Negative for blurred vision, pain and discharge.  Respiratory: Positive for cough and shortness of breath. Negative for sputum production, wheezing and stridor.   Cardiovascular: Negative for chest pain, palpitations, orthopnea and PND.  Gastrointestinal: Negative for abdominal pain, diarrhea, nausea and vomiting.  Genitourinary: Negative for frequency and urgency.  Musculoskeletal: Negative for back pain and joint pain.  Neurological: Negative for sensory change, speech change, focal weakness and weakness.  Psychiatric/Behavioral: Negative for depression and hallucinations. The patient is not nervous/anxious.    Tolerating Diet:yes Tolerating PT: ambulatory  DRUG ALLERGIES:   Allergies  Allergen Reactions  . Ace Inhibitors Swelling    Ok with benazapril, per grouphome    VITALS:  Blood pressure (!) 155/73, pulse 100, temperature 97.8 F (36.6 C), temperature source Oral, resp. rate 13, height 5\' 4"  (1.626 m), weight 59.4 kg, SpO2 96 %.  PHYSICAL EXAMINATION:   Physical Exam  GENERAL:  70 y.o.-year-old patient lying in the bed with no acute distress.  LUNGS: Normal breath sounds bilaterally, no wheezing, rales, rhonchi. No use of accessory muscles of respiration.  CARDIOVASCULAR: S1, S2 normal. No murmurs, rubs, or gallops.  ABDOMEN: Soft, nontender, nondistended. Bowel sounds present. No organomegaly or mass.  EXTREMITIES: No cyanosis, clubbing or edema b/l.     PSYCHIATRIC:  patient is alert and awake. Pleasant affect SKIN: No obvious rash, lesion, or ulcer.   LABORATORY PANEL:  CBC Recent Labs  Lab 05/22/2020 0621  WBC 7.8  HGB 15.2  HCT 47.0  PLT 181    Chemistries  Recent Labs  Lab 06/14/2020 0621  NA 139  K 4.7  CL 100  CO2 29  GLUCOSE 175*  BUN 17  CREATININE 1.04  CALCIUM 9.5   Cardiac Enzymes No results for input(s): TROPONINI in the last 168 hours. RADIOLOGY:  DG Chest 2 View  Result Date: 06/04/2020 CLINICAL DATA:  Dyspnea EXAM: CHEST - 2 VIEW COMPARISON:  06/04/2020 FINDINGS: The lungs are symmetrically hyperinflated in there is a paucity of vasculature, particularly within the lung apices in keeping with changes of at least moderate emphysema. No pneumothorax or pleural effusion. No superimposed confluent pulmonary infiltrate. Cardiac size within normal limits. Pulmonary vascularity is normal. No acute bone abnormality. IMPRESSION: No active cardiopulmonary disease.  Moderate emphysema. Electronically Signed   By: Fidela Salisbury MD   On: 05/24/2020 01:46   ASSESSMENT AND PLAN:  [name] is a 70 y.o. African-American male with medical history significant for COPD, schizophrenia and hypertension, who presented to the emergency room with acute onset of worsening dyspnea with associated cough productive of whitish sputum as well as wheezing that started yesterday with mild chest tightness with deep breathing.  COPD acute exacerbation likely secondary to acute bronchitis. - continue bronchodilator therapy with nebulized DuoNebs 4 times daily and every 4 hours as needed. -steroid therapy with IV Solu-Medrol--change to po taper in am - Mucolytic therapy will be provided. -empiric doxycycline x5 days --wean to RA-- patient used  to have oxygen set up however he does not use at home. He prefers to smoke and he returned the oxygen equipment back. -- Sats are 97% on 2 L. Will wean to room air   Acute hypoxic respiratory  failure.  The patient has a history of chronic respiratory failure however he does not wear oxygen chronically at home. - O2 protocol will be followed. - Management otherwise as above.  Schizophrenia. - continue  Abilify and Seroquel.  Dementia. -continue Aricept.   Essential hypertension. - continue his Azor.   GERD. - on PPI therapy.  Coronary artery disease. -  as needed sublingual nitroglycerin.  DVT prophylaxis: Lovenox. Code Status: full code. Family Communication:  none today Disposition Plan: Back to previous home environment--Family care home Consults called: none. Status is: Inpatient  Remains inpatient appropriate because Inpatient level of care appropriate due to severity of illness   Dispo: The patient is from: Home  Anticipated d/c is to: [facility] in am   Patient currently is not medically stable to d/c.              Difficult to place patient No patient overall is improving. If continues to show improvement weaned off to room air will discharged back to his family care home tomorrow.  TOTAL TIME TAKING CARE OF THIS PATIENT: *25* minutes.  >50% time spent on counselling and coordination of care  Note: This dictation was prepared with Dragon dictation along with smaller phrase technology. Any transcriptional errors that result from this process are unintentional.  Fritzi Mandes M.D    [facility]   CC: Primary care physician; [facility], [facility] ID: Carlos Nelson, male   DOB: 03/03/1950, 70 y.o.   MRN: [id number]

## 2020-06-07 NOTE — ED Provider Notes (Signed)
[facility] Emergency Department Provider Note  ____________________________________________   Event Date/Time   First MD Initiated Contact with Patient 05/21/2020 [phone]     (approximate)  I have reviewed the triage vital signs and the nursing notes.   HISTORY  Chief Complaint Shortness of Breath    HPI Carlos Nelson is a 70 y.o. male with history of COPD, hypertension, schizophrenia who presents to the emergency department complaints of shortness of breath that started today.  He states he also has had chest tightness.  Patient is unfortunately a very poor historian.  He is unable to describe this further.  States he thinks he has had a fever.  No productive cough.  No lower extremity swelling or pain.  States he is feeling better after breathing treatments provided by EMS.  Also given IV Solu-Medrol in route.  Does not wear oxygen chronically.        Past Medical History:  Diagnosis Date  . Chronic respiratory failure with hypoxia ([place])   . COPD (chronic obstructive pulmonary disease) ([place])   . Hypertension   . Schizophrenia [facility])     Patient Active Problem List   Diagnosis Date Noted  . Sepsis ([place]) 05/21/2020  . Chest pain 05/21/2020  . Acute respiratory failure with hypoxia and hypercapnia (HCC)   . Protein-calorie malnutrition, severe 03/18/2020  . COPD with acute exacerbation ([place]) 03/10/2020  . Right-sided chest pain 03/10/2020  . HLD (hyperlipidemia) 01/18/2020  . CAD (coronary artery disease) 01/18/2020  . Hypertensive urgency 01/18/2020  . Tobacco abuse 01/18/2020  . Scrotum pain   . Transaminitis   . Demand ischemia ([place]) 06/11/2019  . Malnutrition of moderate degree 06/10/2019  . NSTEMI (non-ST elevated myocardial infarction) ([place]) 06/09/2019  . Depression with anxiety 06/09/2019  . Pleuritic chest pain 05/20/2019  . CAP (community acquired pneumonia) 05/20/2019  . Hypertension   . COPD exacerbation ([place])   . Acute on  chronic respiratory failure with hypoxia and hypercapnia (HCC)   . Altered mental status 09/25/2018  . Schizophrenia ([place])   . Tachycardia 08/21/2018  . Schizophrenia, chronic with acute exacerbation ([place]) 08/17/2018  . Elevated PSA 05/24/2015    Past Surgical History:  Procedure Laterality Date  . COLONOSCOPY    . COLONOSCOPY WITH PROPOFOL N/A 12/13/2016   Procedure: COLONOSCOPY WITH PROPOFOL;  Surgeon: Lollie Sails, MD;  Location: [facility] ENDOSCOPY;  Service: Endoscopy;  Laterality: N/A;  . ESOPHAGOGASTRODUODENOSCOPY (EGD) WITH PROPOFOL N/A 12/13/2016   Procedure: ESOPHAGOGASTRODUODENOSCOPY (EGD) WITH PROPOFOL;  Surgeon: Lollie Sails, MD;  Location: [facility] ENDOSCOPY;  Service: Endoscopy;  Laterality: N/A;  . left arm surgery      Prior to Admission medications   Medication Sig Start Date End Date Taking? Authorizing Provider  albuterol (VENTOLIN HFA) 108 (90 Base) MCG/ACT inhaler Inhale 2 by mouth every 4 hours as needed for wheezing, cough, and/or shortness of breath 05/23/20   Rai, Ripudeep K, MD  amlodipine-olmesartan (AZOR) 10-20 MG tablet Take 1 tablet by mouth daily. 03/23/20   Loel Dubonnet, NP  ARIPiprazole (ABILIFY) 30 MG tablet Take 30 mg by mouth daily. 01/29/20   [provider]  aspirin EC 81 MG EC tablet Take 1 tablet (81 mg total) by mouth daily. Swallow whole. 05/24/20   Rai, Ripudeep Raliegh Ip, MD  dextromethorphan-guaiFENesin (MUCINEX DM) 30-600 MG 12hr tablet Take 1 tablet by mouth 2 (two) times daily as needed for cough. 05/23/20   Rai, Ripudeep K, MD  donepezil (ARICEPT) 5 MG tablet Take  5 mg by mouth at bedtime.    [provider]  ipratropium-albuterol (DUONEB) 0.5-2.5 (3) MG/3ML SOLN Take 3 mLs by nebulization every 4 (four) hours for 14 days. Patient taking differently: Take 3 mLs by nebulization every 4 (four) hours as needed. 03/11/20 03/25/20  Fritzi Mandes, MD  mometasone-formoterol [facility]) 100-5 MCG/ACT AERO Inhale 2 puffs into the lungs 2 (two)  times daily. 05/23/20   Rai, Vernelle Emerald, MD  nitroGLYCERIN (NITROSTAT) 0.4 MG SL tablet Place 1 tablet (0.4 mg total) under the tongue every 5 (five) minutes as needed for chest pain. 05/23/20   Rai, Ripudeep K, MD  pantoprazole (PROTONIX) 20 MG tablet Take 1 tablet (20 mg total) by mouth daily. 12/18/19 12/17/20  Lavonia Drafts, MD  predniSONE (DELTASONE) 10 MG tablet Prednisone dosing: Take  Prednisone 40mg  (4 tabs) x 3 days, then taper to 30mg  (3 tabs) x 3 days, then 20mg  (2 tabs) x 3days, then 10mg  (1 tab) x 3days, then OFF. 05/23/20   Rai, Ripudeep K, MD  QUEtiapine (SEROQUEL) 200 MG tablet Take 3 tablets (600 mg total) by mouth at bedtime. 02/03/20   Harvest Dark, MD  traZODone (DESYREL) 100 MG tablet Take 100 mg by mouth at bedtime. 02/24/20   [provider]    Allergies Ace inhibitors  Family History  Problem Relation Age of Onset  . Prostate cancer Neg Hx   . Bladder Cancer Neg Hx     Social History Social History   Tobacco Use  . Smoking status: Current Every Day Smoker    Packs/day: 1.00    Types: Cigarettes  . Smokeless tobacco: Never Used  Substance Use Topics  . Alcohol use: Yes    Alcohol/week: 0.0 standard drinks    Comment: beer  . Drug use: Not Currently    Types: Marijuana    Review of Systems Constitutional: No fever. Eyes: No visual changes. ENT: No sore throat. Cardiovascular: + chest pain. Respiratory: + shortness of breath. Gastrointestinal: No nausea, vomiting, diarrhea. Genitourinary: Negative for dysuria. Musculoskeletal: Negative for back pain. Skin: Negative for rash. Neurological: Negative for focal weakness or numbness.  ____________________________________________   PHYSICAL EXAM:  VITAL SIGNS: ED Triage Vitals  Enc Vitals Group     BP 05/30/2020 0026 110/78     Pulse Rate 05/31/2020 0026 (!) 109     Resp 05/30/2020 0026 (!) 32     Temp 06/02/2020 0026 98.3 F (36.8 C)     Temp src --      SpO2 05/19/2020 0026 98 %     Weight  06/11/2020 0028 131 lb (59.4 kg)     Height 05/19/2020 0028 5\' 4"  (1.626 m)     Head Circumference --      Peak Flow --      Pain Score 05/22/2020 0027 8     Pain Loc --      Pain Edu? --      Excl. in [place]? --    CONSTITUTIONAL: Alert and oriented and responds appropriately to questions.  Chronically ill-appearing, elderly HEAD: Normocephalic EYES: Conjunctivae clear, pupils appear equal, EOM appear intact ENT: normal nose; moist mucous membranes NECK: Supple, normal ROM CARD: Regular and tachycardic; S1 and S2 appreciated; no murmurs, no clicks, no rubs, no gallops RESP: Normal chest excursion without splinting or tachypnea on my examination; breath sounds clear and equal bilaterally; no wheezes, no rhonchi, no rales, no hypoxia or respiratory distress, speaking full sentences, breath sounds diminished at bases bilaterally ABD/GI: Normal bowel sounds;  non-distended; soft, non-tender, no rebound, no guarding, no peritoneal signs, no hepatosplenomegaly BACK: The back appears normal EXT: Normal ROM in all joints; no deformity noted, no edema; no cyanosis, no calf tenderness or calf swelling SKIN: Normal color for age and race; warm; no rash on exposed skin NEURO: Moves all extremities equally PSYCH: The patient's mood and manner are appropriate.  ____________________________________________   LABS (all labs ordered are listed, but only abnormal results are displayed)  Labs Reviewed  CBC WITH DIFFERENTIAL/PLATELET - Abnormal; Notable for the following components:      Result Value   Abs Immature Granulocytes 0.08 (*)    All other components within normal limits  BASIC METABOLIC PANEL - Abnormal; Notable for the following components:   Glucose, Bld 126 (*)    All other components within normal limits  RESP PANEL BY RT-PCR (FLU A&B, COVID) ARPGX2  TROPONIN I (HIGH SENSITIVITY)  TROPONIN I (HIGH SENSITIVITY)   ____________________________________________  EKG   EKG  Interpretation  Date/Time:  Tuesday Jun 07 2020 00:37:34 EDT Ventricular Rate:  107 PR Interval:  124 QRS Duration: 85 QT Interval:  337 QTC Calculation: 450 R Axis:   108 Text Interpretation: Sinus tachycardia Biatrial enlargement Right axis deviation ST elevation, consider inferior injury Artifact in lead(s) I II aVR Confirmed by Pryor Curia [phone]) on 05/22/2020 1:05:01 AM       ____________________________________________  RADIOLOGY Jessie Foot Aissata Wilmore, personally viewed and evaluated these images (plain radiographs) as part of my medical decision making, as well as reviewing the written report by the radiologist.  ED MD interpretation: Chest x-ray shows emphysema but otherwise no acute abnormality.  Official radiology report(s): DG Chest 2 View  Result Date: 05/16/2020 CLINICAL DATA:  Dyspnea EXAM: CHEST - 2 VIEW COMPARISON:  06/04/2020 FINDINGS: The lungs are symmetrically hyperinflated in there is a paucity of vasculature, particularly within the lung apices in keeping with changes of at least moderate emphysema. No pneumothorax or pleural effusion. No superimposed confluent pulmonary infiltrate. Cardiac size within normal limits. Pulmonary vascularity is normal. No acute bone abnormality. IMPRESSION: No active cardiopulmonary disease.  Moderate emphysema. Electronically Signed   By: Fidela Salisbury MD   On: 06/04/2020 01:46    ____________________________________________   PROCEDURES  Procedure(s) performed (including Critical Care):  Procedures  CRITICAL CARE Performed by: Pryor Curia   Total critical care time: 45 minutes  Critical care time was exclusive of separately billable procedures and treating other patients.  Critical care was necessary to treat or prevent imminent or life-threatening deterioration.  Critical care was time spent personally by me on the following activities: development of treatment plan with patient and/or surrogate as well as nursing,  discussions with consultants, evaluation of patient's response to treatment, examination of patient, obtaining history from patient or surrogate, ordering and performing treatments and interventions, ordering and review of laboratory studies, ordering and review of radiographic studies, pulse oximetry and re-evaluation of patient's condition.  ____________________________________________   INITIAL IMPRESSION / ASSESSMENT AND PLAN / ED COURSE  As part of my medical decision making, I reviewed the following data within the [place] notes reviewed and incorporated, Labs reviewed , EKG interpreted , Old EKG reviewed, Old chart reviewed, Radiograph reviewed , Discussed with admitting physician  and Notes from prior ED visits         Patient here with COPD exacerbation.  Does not appear volume overloaded.  Is complaining of chest pain but is a very poor historian.  Cardiac labs  pending.  Differential includes ACS, PE, dissection, CHF, pneumonia as well.  EKG nonischemic.  Patient reports feeling better after breathing treatments and Solu-Medrol with EMS.  Will give another albuterol, Atrovent here in the ED.  ED PROGRESS  Sats dropped to 84% before treatment in ED.  Given another treatment here and will reassess O2 sats.  First troponin normal.  Chest x-ray clear.  Repeat troponin pending.  3:53 AM Pt's sats had improved to 91% on room air at rest after 3 breathing treatments but dropped to 88% with ambulation.  Will admit for COPD exacerbation, hypoxia.  Does not wear oxygen chronically.  4:00 AM  Discussed patient's case with hospitalist, Dr. Sidney Ace.  I have recommended admission and patient (and family if present) agree with this plan. Admitting physician will place admission orders.   I reviewed all nursing notes, vitals, pertinent previous records and reviewed/interpreted all EKGs, lab and urine results, imaging (as available).  Repeat troponin  flat. ____________________________________________   FINAL CLINICAL IMPRESSION(S) / ED DIAGNOSES  Final diagnoses:  COPD exacerbation ([place])  Acute respiratory failure with hypoxia [facility])     ED Discharge Orders    None      *Please note:  Carlos Nelson was evaluated in Emergency Department on 06/01/2020 for the symptoms described in the history of present illness. He was evaluated in the context of the global COVID-19 pandemic, which necessitated consideration that the patient might be at risk for infection with the SARS-CoV-2 virus that causes COVID-19. Institutional protocols and algorithms that pertain to the evaluation of patients at risk for COVID-19 are in a state of rapid change based on information released by regulatory bodies including the CDC and federal and state organizations. These policies and algorithms were followed during the patient's care in the ED.  Some ED evaluations and interventions may be delayed as a result of limited staffing during and the pandemic.*   Note:  This document was prepared using Dragon voice recognition software and may include unintentional dictation errors.   Jamani Eley, Delice Bison, DO 06/14/2020 [phone]

## 2020-06-07 NOTE — Progress Notes (Signed)
[facility]) Hospital Liaison RN note:  This patient is currently followed by out patient based palliative care with [facility]. Dayton Scrape, TOC is aware.  [facility] Liaison will follow for disposition. He is a current resident of [facility], but may be finding another group home.   Thank you.  Zandra Abts, RN  [facility] Liaison [phone]

## 2020-06-07 NOTE — Progress Notes (Addendum)
Mobility Specialist - Progress Note   05/27/2020 1300  Mobility  Activity Ambulated in hall  Level of Assistance Standby assist, set-up cues, supervision of patient - no hands on  Assistive Device None  Distance Ambulated (ft) 240 ft  Mobility Ambulated with assistance in hallway;Ambulated independently in hallway  Mobility Response Tolerated well  Mobility performed by Mobility specialist  $Mobility charge 1 Mobility    O2 while resting on RA = 98% O2 while AMB on RA = 89% O2 while AMB on 1L = 95%   Pt sitting EOB on arrival, utilizing RA. Pt voiced chest pain 8/10, clarified that CP has been an ongoing issue, confirmed with RN okay to continue with session. PLB educated. Pt ambulated 160' then an additional 38'. VC to carefully pace self during activity. Reports SOB with ambulation, O2 sats monitored and recorded above. 1 rest break taken during activity as O2 desat to a low of 87% once seated. [place] applied on 1L to rebound sats to mid 90s with little-no change in sats during second bout of ambulation. HR 98-116 bpm. RPE 8/10 with distance being reported as most difficult part. Pt left in chair, motivated to ambulate again this evening. Will attempt second session as time permits. 94% on RA prior to exit.    Kathee Delton Mobility Specialist 05/21/2020, 2:18 PM

## 2020-06-07 NOTE — Progress Notes (Signed)
Mobility Specialist - Progress Note   05/20/2020 1600  Mobility  Activity Ambulated in hall  Level of Assistance Modified independent, requires aide device or extra time  Assistive Device None  Distance Ambulated (ft) 500 ft  Mobility Ambulated independently in hallway  Mobility Response Tolerated well  Mobility performed by Mobility specialist  $Mobility charge 1 Mobility    Pre-mobility: 112 HR, 92% SpO2 During mobility: 89-90% SpO2 Post-mobility: 106 HR, 94% SpO2   Pt ambulated in hallway independently. No LOB. Reports breathing is much better this attempt than previous session, tolerating RA. O2 desat to a low of 89% during session. Pt states he's a "mouth breather" and PLB is difficult. RPE 5/10.    Carlos Nelson Mobility Specialist 05/29/2020, 4:34 PM

## 2020-06-07 NOTE — H&P (Signed)
[place]   PATIENT NAME: Carlos Nelson    MR#:  [id number]  DATE OF BIRTH:  [birth year]  DATE OF ADMISSION:  05/17/2020  PRIMARY CARE PHYSICIAN: [facility]   Patient is coming from: Home.  REQUESTING/REFERRING PHYSICIAN: Ward, Delice Bison, DO.  CHIEF COMPLAINT:   Chief Complaint  Patient presents with  . Shortness of Breath    HISTORY OF PRESENT ILLNESS:  Carlos Nelson is a 70 y.o. African-American male with medical history significant for COPD, schizophrenia and hypertension, who presented to the emergency room with acute onset of worsening dyspnea with associated cough productive of whitish sputum as well as wheezing that started yesterday with mild chest tightness with deep breathing.  He admitted to tactile fever without chills.  Stated that he vomited 3 times and had mild epigastric abdominal pain.  No bleeding diathesis.  He continues to smoke about 8 cigarettes/day.  He denies any dysuria, oliguria or hematuria or flank pain. ED Course: Upon presentation to the emergency room, heart rate was 109 with respiratory rate of 32 and pulse oximetry was 98% on 4 L of O2 by nasal cannula.  Has been as low as 84% on room air.  Vital signs otherwise were within normal.  Labs revealed unremarkable CBC and BMP.  Influenza antigens and COVID-19 PCR came back negative.  High-sensitivity troponin I came back 17 and later 14. EKG as reviewed by me :Showed sinus tachycardia with a rate of 107 with biatrial enlargement, right axis deviation and early repolarization with J-point elevation inferiorly Imaging: 2 view chest x-ray showed moderate emphysema with no acute cardiopulmonary disease.  The patient was given nebulized DuoNebs twice as well as 5 mg of albuterol nebulizer.  He received IV Solu-Medrol on route to the hospital by EMS.  He will be admitted to a medical monitored bed for further evaluation and management. PAST MEDICAL HISTORY:   Past Medical History:   Diagnosis Date  . Chronic respiratory failure with hypoxia ([place])   . COPD (chronic obstructive pulmonary disease) ([place])   . Hypertension   . Schizophrenia ([place])     PAST SURGICAL HISTORY:   Past Surgical History:  Procedure Laterality Date  . COLONOSCOPY    . COLONOSCOPY WITH PROPOFOL N/A 12/13/2016   Procedure: COLONOSCOPY WITH PROPOFOL;  Surgeon: Lollie Sails, MD;  [facility] ENDOSCOPY;  Service: Endoscopy;  Laterality: N/A;  . ESOPHAGOGASTRODUODENOSCOPY (EGD) WITH PROPOFOL N/A 12/13/2016   Procedure: ESOPHAGOGASTRODUODENOSCOPY (EGD) WITH PROPOFOL;  Surgeon: Lollie Sails, MD;  Location: [facility] ENDOSCOPY;  Service: Endoscopy;  Laterality: N/A;  . left arm surgery      SOCIAL HISTORY:   Social History   Tobacco Use  . Smoking status: Current Every Day Smoker    Packs/day: 1.00    Types: Cigarettes  . Smokeless tobacco: Never Used  Substance Use Topics  . Alcohol use: Yes    Alcohol/week: 0.0 standard drinks    Comment: beer    FAMILY HISTORY:   Family History  Problem Relation Age of Onset  . Prostate cancer Neg Hx   . Bladder Cancer Neg Hx     DRUG ALLERGIES:   Allergies  Allergen Reactions  . Ace Inhibitors Swelling    Ok with benazapril, per grouphome    REVIEW OF SYSTEMS:   ROS As per history of present illness. All pertinent systems were reviewed above. Constitutional, HEENT, cardiovascular, respiratory, GI, GU, musculoskeletal, neuro, psychiatric, endocrine, integumentary and hematologic systems were reviewed and  are otherwise negative/unremarkable except for positive findings mentioned above in the HPI.   MEDICATIONS AT HOME:   Prior to Admission medications   Medication Sig Start Date End Date Taking? Authorizing Provider  albuterol (VENTOLIN HFA) 108 (90 Base) MCG/ACT inhaler Inhale 2 by mouth every 4 hours as needed for wheezing, cough, and/or shortness of breath 05/23/20   Rai, Ripudeep K, MD  amlodipine-olmesartan (AZOR) 10-20 MG  tablet Take 1 tablet by mouth daily. 03/23/20   Loel Dubonnet, NP  ARIPiprazole (ABILIFY) 30 MG tablet Take 30 mg by mouth daily. 01/29/20   [provider]  aspirin EC 81 MG EC tablet Take 1 tablet (81 mg total) by mouth daily. Swallow whole. 05/24/20   Rai, Ripudeep Raliegh Ip, MD  dextromethorphan-guaiFENesin (MUCINEX DM) 30-600 MG 12hr tablet Take 1 tablet by mouth 2 (two) times daily as needed for cough. 05/23/20   Rai, Ripudeep K, MD  donepezil (ARICEPT) 5 MG tablet Take 5 mg by mouth at bedtime.    [provider]  ipratropium-albuterol (DUONEB) 0.5-2.5 (3) MG/3ML SOLN Take 3 mLs by nebulization every 4 (four) hours for 14 days. Patient taking differently: Take 3 mLs by nebulization every 4 (four) hours as needed. 03/11/20 03/25/20  Fritzi Mandes, MD  mometasone-formoterol [facility]) 100-5 MCG/ACT AERO Inhale 2 puffs into the lungs 2 (two) times daily. 05/23/20   Rai, Vernelle Emerald, MD  nitroGLYCERIN (NITROSTAT) 0.4 MG SL tablet Place 1 tablet (0.4 mg total) under the tongue every 5 (five) minutes as needed for chest pain. 05/23/20   Rai, Ripudeep K, MD  pantoprazole (PROTONIX) 20 MG tablet Take 1 tablet (20 mg total) by mouth daily. 12/18/19 12/17/20  Lavonia Drafts, MD  predniSONE (DELTASONE) 10 MG tablet Prednisone dosing: Take  Prednisone 40mg  (4 tabs) x 3 days, then taper to 30mg  (3 tabs) x 3 days, then 20mg  (2 tabs) x 3days, then 10mg  (1 tab) x 3days, then OFF. 05/23/20   Rai, Ripudeep K, MD  QUEtiapine (SEROQUEL) 200 MG tablet Take 3 tablets (600 mg total) by mouth at bedtime. 02/03/20   Harvest Dark, MD  traZODone (DESYREL) 100 MG tablet Take 100 mg by mouth at bedtime. 02/24/20   [provider]      VITAL SIGNS:  Blood pressure 127/81, pulse (!) 110, temperature 98.3 F (36.8 C), resp. rate 17, height 5\' 4"  (1.626 m), weight 59.4 kg, SpO2 91 %.  PHYSICAL EXAMINATION:  Physical Exam  GENERAL:  70 y.o.-year-old African-American male patient lying in the bed with mild  respiratory distress with conversational dyspnea.  EYES: Pupils equal, round, reactive to light and accommodation. No scleral icterus. Extraocular muscles intact.  HEENT: Head atraumatic, normocephalic. Oropharynx and nasopharynx clear.  NECK:  Supple, no jugular venous distention. No thyroid enlargement, no tenderness.  LUNGS: Diffuse expiratory wheezes with tight expiratory airflow and harsh vesicular breathing. CARDIOVASCULAR: Regular rate and rhythm, S1, S2 normal. No murmurs, rubs, or gallops.  ABDOMEN: Soft, nondistended, nontender. Bowel sounds present. No organomegaly or mass.  EXTREMITIES: No pedal edema, cyanosis, or clubbing.  NEUROLOGIC: Cranial nerves II through XII are intact. Muscle strength 5/5 in all extremities. Sensation intact. Gait not checked.  PSYCHIATRIC: The patient is alert and oriented x 3.  Normal affect and good eye contact. SKIN: No obvious rash, lesion, or ulcer.   LABORATORY PANEL:   CBC Recent Labs  Lab 05/31/2020 0036  WBC 9.9  HGB 13.7  HCT 42.4  PLT 200   ------------------------------------------------------------------------------------------------------------------  Chemistries  Recent Labs  Lab 05/24/2020 0036  NA 139  K 4.4  CL 102  CO2 28  GLUCOSE 126*  BUN 18  CREATININE 1.22  CALCIUM 9.0   ------------------------------------------------------------------------------------------------------------------  Cardiac Enzymes No results for input(s): TROPONINI in the last 168 hours. ------------------------------------------------------------------------------------------------------------------  RADIOLOGY:  DG Chest 2 View  Result Date: 06/06/2020 CLINICAL DATA:  Dyspnea EXAM: CHEST - 2 VIEW COMPARISON:  06/04/2020 FINDINGS: The lungs are symmetrically hyperinflated in there is a paucity of vasculature, particularly within the lung apices in keeping with changes of at least moderate emphysema. No pneumothorax or pleural effusion. No  superimposed confluent pulmonary infiltrate. Cardiac size within normal limits. Pulmonary vascularity is normal. No acute bone abnormality. IMPRESSION: No active cardiopulmonary disease.  Moderate emphysema. Electronically Signed   By: Fidela Salisbury MD   On: 05/25/2020 01:46      IMPRESSION AND PLAN:  Active Problems:   COPD exacerbation ([place])  1.  COPD acute exacerbation likely secondary to acute bronchitis. - The patient be admitted to a medical monitored bed. - We will continue bronchodilator therapy with nebulized DuoNebs 4 times daily and every 4 hours as needed. - We will hold off long-acting beta agonist. - We will continue steroid therapy with IV Solu-Medrol. - We will place him on antibiotic therapy with IV Rocephin for severe COPD exacerbation. - Mucolytic therapy will be provided. -We will obtain sputum culture sensitivity.  2.  Acute hypoxic respiratory failure.  The patient has a history of chronic respiratory failure however he does not wear oxygen chronically at home. - O2 protocol will be followed. - Management otherwise as above.  3.  Schizophrenia. - We will continue his Abilify and Seroquel.  4.  Dementia. - We will continue his Aricept.  5.  Essential hypertension. - We will continue his Azor.  6.  GERD. -We will continue PPI therapy.  7.  Coronary artery disease. -We will continue his as needed sublingual nitroglycerin.  DVT prophylaxis: Lovenox. Code Status: full code. Family Communication:  The plan of care was discussed in details with the patient (who requested no other family members to be notified.  Ly). I answered all questions. The patient agreed to proceed with the above mentioned plan. Further management will depend upon hospital course. Disposition Plan: Back to previous home environment Consults called: none. All the records are reviewed and case discussed with ED provider.  Status is: Inpatient  Remains inpatient appropriate  because:Ongoing diagnostic testing needed not appropriate for outpatient work up, Unsafe d/c plan, IV treatments appropriate due to intensity of illness or inability to take PO and Inpatient level of care appropriate due to severity of illness   Dispo: The patient is from: Home              Anticipated d/c is to: Home              Patient currently is not medically stable to d/c.   Difficult to place patient No   TOTAL TIME TAKING CARE OF THIS PATIENT: 55 minutes.    Christel Mormon M.D on 05/22/2020 at 4:25 AM  [facility]   From 7 PM-7 AM, contact night-coverage [email or web address]  CC: Primary care physician; [facility]

## 2020-06-07 NOTE — Progress Notes (Signed)
[facility]) Hospital Liaison RN note  This patient is currently enrolled in [facility] outpatient based palliative care.  [facility] will continue to follow for discharge disposition.  Please call with any outpatient based palliative care related questions or concerns.  Thank you, Margaretmary Eddy, BSN, RN [facility] Liaison [phone]

## 2020-06-07 NOTE — TOC Initial Note (Addendum)
Transition of Care [facility]) - Initial/Assessment Note    Patient Details  Name: Carlos Nelson MRN: [id number] Date of Birth: [birth year]  [facility]) CM/SW Contact:    Candie Chroman, LCSW Phone Number: 06/14/2020, 10:06 AM  Clinical Narrative:  Readmission prevention screen complete. CSW met with patient. No supports at bedside. CSW introduced role and explained that discharge planning would be discussed. Patient is a resident at [facility] but says they are working on finding him another group home to go to. Spoke with Angelena Sole at [facility] and she confirmed. She is asking for assistance in finding alternative placement while in the hospital. If unable to find, they will take him back at discharge and continue the process from the facility. PCP is [facility]. [facility] takes him to appointments. Pharmacy is [facility]. Patient reports sometimes his medications are expensive. No home health prior to admission. Patient was set up with oxygen on 4/26 but had it sent back because he prefers to smoke. Patient stated he has a nebulizer machine at the facility. Patient is active with [facility] for outpatient palliative services. No further concerns. CSW encouraged patient to contact CSW as needed. CSW will continue to follow patient for support and facilitate discharge when stable.            2:58 pm: Notified Ms. Lea that patient will likely discharge tomorrow. She is agreeable and stated that patient will need [facility] to bring him back.  Expected Discharge Plan: Group Home Barriers to Discharge: Continued Medical Work up   Patient Goals and CMS Choice        Expected Discharge Plan and Services Expected Discharge Plan: Group Home       Living arrangements for the past 2 months: Group Home                                      Prior Living Arrangements/Services Living arrangements for the past 2 months: Group  Home Lives with:: Facility Resident Patient language and need for interpreter reviewed:: Yes Do you feel safe going back to the place where you live?: Yes      Need for Family Participation in Patient Care: Yes (Comment) Care giver support system in place?: Yes (comment)   Criminal Activity/Legal Involvement Pertinent to Current Situation/Hospitalization: No - Comment as needed  Activities of Daily Living Home Assistive Devices/Equipment: None ADL Screening (condition at time of admission) Patient's cognitive ability adequate to safely complete daily activities?: Yes Is the patient deaf or have difficulty hearing?: No Does the patient have difficulty seeing, even when wearing glasses/contacts?: No Does the patient have difficulty concentrating, remembering, or making decisions?: No Patient able to express need for assistance with ADLs?: Yes Does the patient have difficulty dressing or bathing?: No Independently performs ADLs?: Yes (appropriate for developmental age) Does the patient have difficulty walking or climbing stairs?: No Weakness of Legs: None Weakness of Arms/Hands: None  Permission Sought/Granted Permission sought to share information with : Facility Art therapist granted to share information with : Yes, Verbal Permission Granted     Permission granted to share info w AGENCY: [facility]        Emotional Assessment Appearance:: Appears stated age Attitude/Demeanor/Rapport: Engaged,Gracious Affect (typically observed): Accepting,Appropriate,Calm,Pleasant Orientation: : Oriented to Self,Oriented to Place,Oriented to  Time,Oriented to Situation Alcohol / Substance  Use: Not Applicable Psych Involvement: No (comment)  Admission diagnosis:  COPD exacerbation ([place]) [J44.1] Acute respiratory failure with hypoxia ([place]) [J96.01] Patient Active Problem List   Diagnosis Date Noted  . Sepsis ([place]) 05/21/2020  . Chest pain 05/21/2020  .  Acute respiratory failure with hypoxia and hypercapnia ([facility])   . Protein-calorie malnutrition, severe 03/18/2020  . COPD with acute exacerbation ([place]) 03/10/2020  . Right-sided chest pain 03/10/2020  . HLD (hyperlipidemia) 01/18/2020  . CAD (coronary artery disease) 01/18/2020  . Hypertensive urgency 01/18/2020  . Tobacco abuse 01/18/2020  . Scrotum pain   . Transaminitis   . Demand ischemia ([place]) 06/11/2019  . Malnutrition of moderate degree 06/10/2019  . NSTEMI (non-ST elevated myocardial infarction) ([place]) 06/09/2019  . Depression with anxiety 06/09/2019  . Pleuritic chest pain 05/20/2019  . CAP (community acquired pneumonia) 05/20/2019  . Hypertension   . COPD exacerbation ([place])   . Acute on chronic respiratory failure with hypoxia and hypercapnia ([facility])   . Altered mental status 09/25/2018  . Schizophrenia ([place])   . Tachycardia 08/21/2018  . Schizophrenia, chronic with acute exacerbation ([place]) 08/17/2018  . Elevated PSA 05/24/2015   PCP:  [facility]:   [facility], [place] [place] [address] Phone: [phone] Fax: [phone]     Social Determinants of Health (SDOH) Interventions    Readmission Risk Interventions Readmission Risk Prevention Plan 06/01/2020  Transportation Screening Complete  Medication Review ([place]) Complete  PCP or Specialist appointment within 3-5 days of discharge Complete  SW Recovery Care/Counseling Consult Complete  [place] Not Applicable  Some recent data might be hidden

## 2020-06-07 NOTE — Progress Notes (Signed)
Pt scored a 4 on the RT assessment protocol. He is clear to auscultation. The nebulizers are to be changed to Q6.

## 2020-06-08 ENCOUNTER — Observation Stay: Payer: Medicare Other

## 2020-06-08 DIAGNOSIS — Z7951 Long term (current) use of inhaled steroids: Secondary | ICD-10-CM | POA: Diagnosis not present

## 2020-06-08 DIAGNOSIS — R109 Unspecified abdominal pain: Secondary | ICD-10-CM | POA: Diagnosis not present

## 2020-06-08 DIAGNOSIS — F0390 Unspecified dementia without behavioral disturbance: Secondary | ICD-10-CM | POA: Diagnosis present

## 2020-06-08 DIAGNOSIS — J9601 Acute respiratory failure with hypoxia: Secondary | ICD-10-CM | POA: Diagnosis not present

## 2020-06-08 DIAGNOSIS — F17210 Nicotine dependence, cigarettes, uncomplicated: Secondary | ICD-10-CM | POA: Diagnosis present

## 2020-06-08 DIAGNOSIS — I10 Essential (primary) hypertension: Secondary | ICD-10-CM | POA: Diagnosis present

## 2020-06-08 DIAGNOSIS — Z515 Encounter for palliative care: Secondary | ICD-10-CM | POA: Diagnosis not present

## 2020-06-08 DIAGNOSIS — Z7189 Other specified counseling: Secondary | ICD-10-CM | POA: Diagnosis not present

## 2020-06-08 DIAGNOSIS — R739 Hyperglycemia, unspecified: Secondary | ICD-10-CM | POA: Diagnosis present

## 2020-06-08 DIAGNOSIS — I959 Hypotension, unspecified: Secondary | ICD-10-CM | POA: Diagnosis not present

## 2020-06-08 DIAGNOSIS — Z7982 Long term (current) use of aspirin: Secondary | ICD-10-CM | POA: Diagnosis not present

## 2020-06-08 DIAGNOSIS — R1084 Generalized abdominal pain: Secondary | ICD-10-CM

## 2020-06-08 DIAGNOSIS — Z20822 Contact with and (suspected) exposure to covid-19: Secondary | ICD-10-CM | POA: Diagnosis present

## 2020-06-08 DIAGNOSIS — F209 Schizophrenia, unspecified: Secondary | ICD-10-CM | POA: Diagnosis present

## 2020-06-08 DIAGNOSIS — Z79899 Other long term (current) drug therapy: Secondary | ICD-10-CM | POA: Diagnosis not present

## 2020-06-08 DIAGNOSIS — J9622 Acute and chronic respiratory failure with hypercapnia: Secondary | ICD-10-CM | POA: Diagnosis present

## 2020-06-08 DIAGNOSIS — K219 Gastro-esophageal reflux disease without esophagitis: Secondary | ICD-10-CM | POA: Diagnosis present

## 2020-06-08 DIAGNOSIS — J9621 Acute and chronic respiratory failure with hypoxia: Secondary | ICD-10-CM | POA: Diagnosis present

## 2020-06-08 DIAGNOSIS — Z66 Do not resuscitate: Secondary | ICD-10-CM | POA: Diagnosis present

## 2020-06-08 DIAGNOSIS — I2510 Atherosclerotic heart disease of native coronary artery without angina pectoris: Secondary | ICD-10-CM | POA: Diagnosis present

## 2020-06-08 DIAGNOSIS — J441 Chronic obstructive pulmonary disease with (acute) exacerbation: Secondary | ICD-10-CM | POA: Diagnosis present

## 2020-06-08 DIAGNOSIS — J432 Centrilobular emphysema: Secondary | ICD-10-CM | POA: Diagnosis present

## 2020-06-08 LAB — GLUCOSE, CAPILLARY
Glucose-Capillary: 105 mg/dL — ABNORMAL HIGH (ref 70–99)
Glucose-Capillary: 176 mg/dL — ABNORMAL HIGH (ref 70–99)
Glucose-Capillary: 130 mg/dL — ABNORMAL HIGH (ref 70–99)

## 2020-06-08 MED ORDER — INSULIN ASPART 100 UNIT/ML IJ SOLN
0.0000 [IU] | Freq: Three times a day (TID) | INTRAMUSCULAR | Status: DC
  Filled 2020-06-08 (×2): qty 1

## 2020-06-08 MED ORDER — METHYLPREDNISOLONE SODIUM SUCC 40 MG IJ SOLR
40.0000 mg | Freq: Two times a day (BID) | INTRAMUSCULAR | Status: DC
  Administered 2020-06-08 – 2020-06-10 (×5): 40 mg via INTRAVENOUS
  Filled 2020-06-08 (×5): qty 1

## 2020-06-08 MED ORDER — POLYETHYLENE GLYCOL 3350 17 G PO PACK
17.0000 g | PACK | Freq: Every day | ORAL | Status: DC
  Administered 2020-06-08 – 2020-06-10 (×3): 17 g via ORAL
  Filled 2020-06-08 (×3): qty 1

## 2020-06-08 MED ORDER — INSULIN ASPART 100 UNIT/ML IJ SOLN: 0.0000 [IU] | Freq: Every day | INTRAMUSCULAR | Status: DC

## 2020-06-08 NOTE — Progress Notes (Signed)
Mobility Specialist - Progress Note   06/08/20 1500  Mobility  Activity Ambulated in hall  Level of Assistance Standby assist, set-up cues, supervision of patient - no hands on  Assistive Device None  Distance Ambulated (ft) 180 ft  Mobility Ambulated with assistance in hallway  Mobility Response Tolerated well  Mobility performed by Mobility specialist  $Mobility charge 1 Mobility    Pre-mobility: 112 HR, 98% SpO2 During mobility: 115 HR, 86% SpO2 Post-mobility: 110 HR, 94% SpO2   Pt utilizing 3L on arrival, sats at 98%. Pt ambulated in hallway on 2L. O2 desat to 86% during activity. Mobility initiated rest break for breathing management and activity pacing. Labored breathing noted. O2 increased to 3L for remainder of session, rebounding sats > 88%. Denied dizziness. Pt ambulated to bathroom upon return to room and HR briefly peaked at 122 bpm. Pt left on 3L prior to exit.    Kathee Delton Mobility Specialist 06/08/20, 3:20 PM

## 2020-06-08 NOTE — Progress Notes (Signed)
[name], male   DOB: Dec 09, 1950, 70 y.o.   MRN: [id number] [facility] PROGRESS NOTE  Carlos Nelson GXQ:[id number] DOB: 1950-07-19 DOA: 06/13/2020 PCP: [facility]  HPI/Subjective: Patient this morning was having worsening shortness of breath and some cough and wheeze.  His wheezing was audible.  Having some discomfort in his abdomen.  States his bowel movements have been okay.  Admitted with COPD exacerbation.  Objective: Vitals:   06/08/20 0753 06/08/20 1125  BP:  (!) 165/97  Pulse:  (!) 106  Resp:  20  Temp:  98 F (36.7 C)  SpO2: 94% 98%    Intake/Output Summary (Last 24 hours) at 06/08/2020 1210 Last data filed at 06/08/2020 1018 Gross per 24 hour  Intake 840 ml  Output 1400 ml  Net -560 ml   Filed Weights   05/17/2020 0028  Weight: 59.4 kg    ROS: Review of Systems  Respiratory: Positive for cough, shortness of breath and wheezing.   Cardiovascular: Negative for chest pain.  Gastrointestinal: Positive for abdominal pain. Negative for nausea and vomiting.   Exam: Physical Exam HENT:     Head: Normocephalic.     Mouth/Throat:     Pharynx: No oropharyngeal exudate.  Eyes:     General: Lids are normal.     Conjunctiva/sclera: Conjunctivae normal.     Pupils: Pupils are equal, round, and reactive to light.  Cardiovascular:     Rate and Rhythm: Normal rate and regular rhythm.     Heart sounds: Normal heart sounds, S1 normal and S2 normal.  Pulmonary:     Breath sounds: Examination of the right-middle field reveals wheezing. Examination of the left-middle field reveals wheezing. Examination of the right-lower field reveals decreased breath sounds and wheezing. Examination of the left-lower field reveals decreased breath sounds and wheezing. Decreased breath sounds and wheezing present.  Abdominal:     Palpations: Abdomen is soft.     Tenderness: There is abdominal tenderness.  Musculoskeletal:     Right ankle: No swelling.      Left ankle: No swelling.  Skin:    General: Skin is warm.     Findings: No rash.  Neurological:     Mental Status: He is alert and oriented to person, place, and time.       Data Reviewed: Basic Metabolic Panel: Recent Labs  Lab 06/04/20 1509 05/22/2020 0036 06/04/2020 0621  NA 140 139 139  K 4.4 4.4 4.7  CL 101 102 100  CO2 31 28 29   GLUCOSE 121* 126* 175*  BUN 10 18 17   CREATININE 0.72 1.22 1.04  CALCIUM 9.6 9.0 9.5   CBC: Recent Labs  Lab 06/04/20 1319 05/19/2020 0036 06/03/2020 0621  WBC 14.6* 9.9 7.8  NEUTROABS 12.9* 7.6  --   HGB 14.8 13.7 15.2  HCT 45.4 42.4 47.0  MCV 91.3 91.8 92.2  PLT 226 200 181   BNP (last 3 results) Recent Labs    05/09/20 0043 05/22/20 0719 06/04/20 1319  BNP 25.4 85.8 24.8      Recent Results (from the past 240 hour(s))  Resp Panel by RT-PCR (Flu A&B, Covid) Nasopharyngeal Swab     Status: None   Collection Time: 06/04/20  1:19 PM   Specimen: Nasopharyngeal Swab; Nasopharyngeal(NP) swabs in vial transport medium  Result Value Ref Range Status   SARS Coronavirus 2 by RT PCR NEGATIVE NEGATIVE Final    Comment: (NOTE) SARS-CoV-2 target nucleic acids are NOT DETECTED.  The SARS-CoV-2  RNA is generally detectable in upper respiratory specimens during the acute phase of infection. The lowest concentration of SARS-CoV-2 viral copies this assay can detect is 138 copies/mL. A negative result does not preclude SARS-Cov-2 infection and should not be used as the sole basis for treatment or other patient management decisions. A negative result may occur with  improper specimen collection/handling, submission of specimen other than nasopharyngeal swab, presence of viral mutation(s) within the areas targeted by this assay, and inadequate number of viral copies(<138 copies/mL). A negative result must be combined with clinical observations, patient history, and epidemiological information. The expected result is Negative.  Fact Sheet  for Patients:  EntrepreneurPulse.com.au  Fact Sheet for Healthcare Providers:  IncredibleEmployment.be  This test is no t yet approved or cleared by the [place] FDA and  has been authorized for detection and/or diagnosis of SARS-CoV-2 by FDA under an Emergency Use Authorization (EUA). This EUA will remain  in effect (meaning this test can be used) for the duration of the COVID-19 declaration under Section 564(b)(1) of the Act, 21 U.S.C.section 360bbb-3(b)(1), unless the authorization is terminated  or revoked sooner.       Influenza A by PCR NEGATIVE NEGATIVE Final   Influenza B by PCR NEGATIVE NEGATIVE Final    Comment: (NOTE) The Xpert Xpress SARS-CoV-2/FLU/RSV plus assay is intended as an aid in the diagnosis of influenza from Nasopharyngeal swab specimens and should not be used as a sole basis for treatment. Nasal washings and aspirates are unacceptable for Xpert Xpress SARS-CoV-2/FLU/RSV testing.  Fact Sheet for Patients: EntrepreneurPulse.com.au  Fact Sheet for Healthcare Providers: IncredibleEmployment.be  This test is not yet approved or cleared by the [place] FDA and has been authorized for detection and/or diagnosis of SARS-CoV-2 by FDA under an Emergency Use Authorization (EUA). This EUA will remain in effect (meaning this test can be used) for the duration of the COVID-19 declaration under Section 564(b)(1) of the Act, 21 U.S.C. section 360bbb-3(b)(1), unless the authorization is terminated or revoked.  Performed at [facility], [address] [place], [place] [address]   Resp Panel by RT-PCR (Flu A&B, Covid) Nasopharyngeal Swab     Status: None   Collection Time: 06/01/2020  4:28 AM   Specimen: Nasopharyngeal Swab; Nasopharyngeal(NP) swabs in vial transport medium  Result Value Ref Range Status   SARS Coronavirus 2 by RT PCR NEGATIVE NEGATIVE Final    Comment:  (NOTE) SARS-CoV-2 target nucleic acids are NOT DETECTED.  The SARS-CoV-2 RNA is generally detectable in upper respiratory specimens during the acute phase of infection. The lowest concentration of SARS-CoV-2 viral copies this assay can detect is 138 copies/mL. A negative result does not preclude SARS-Cov-2 infection and should not be used as the sole basis for treatment or other patient management decisions. A negative result may occur with  improper specimen collection/handling, submission of specimen other than nasopharyngeal swab, presence of viral mutation(s) within the areas targeted by this assay, and inadequate number of viral copies(<138 copies/mL). A negative result must be combined with clinical observations, patient history, and epidemiological information. The expected result is Negative.  Fact Sheet for Patients:  EntrepreneurPulse.com.au  Fact Sheet for Healthcare Providers:  IncredibleEmployment.be  This test is no t yet approved or cleared by the [place] FDA and  has been authorized for detection and/or diagnosis of SARS-CoV-2 by FDA under an Emergency Use Authorization (EUA). This EUA will remain  in effect (meaning this test can be used) for the duration of the COVID-19  declaration under Section 564(b)(1) of the Act, 21 U.S.C.section 360bbb-3(b)(1), unless the authorization is terminated  or revoked sooner.       Influenza A by PCR NEGATIVE NEGATIVE Final   Influenza B by PCR NEGATIVE NEGATIVE Final    Comment: (NOTE) The Xpert Xpress SARS-CoV-2/FLU/RSV plus assay is intended as an aid in the diagnosis of influenza from Nasopharyngeal swab specimens and should not be used as a sole basis for treatment. Nasal washings and aspirates are unacceptable for Xpert Xpress SARS-CoV-2/FLU/RSV testing.  Fact Sheet for Patients: EntrepreneurPulse.com.au  Fact Sheet for Healthcare  Providers: IncredibleEmployment.be  This test is not yet approved or cleared by the [place] FDA and has been authorized for detection and/or diagnosis of SARS-CoV-2 by FDA under an Emergency Use Authorization (EUA). This EUA will remain in effect (meaning this test can be used) for the duration of the COVID-19 declaration under Section 564(b)(1) of the Act, 21 U.S.C. section 360bbb-3(b)(1), unless the authorization is terminated or revoked.  Performed at [facility], [address] [place], [place] [address]      Studies: DG Chest 2 View  Result Date: 05/27/2020 CLINICAL DATA:  Dyspnea EXAM: CHEST - 2 VIEW COMPARISON:  06/04/2020 FINDINGS: The lungs are symmetrically hyperinflated in there is a paucity of vasculature, particularly within the lung apices in keeping with changes of at least moderate emphysema. No pneumothorax or pleural effusion. No superimposed confluent pulmonary infiltrate. Cardiac size within normal limits. Pulmonary vascularity is normal. No acute bone abnormality. IMPRESSION: No active cardiopulmonary disease.  Moderate emphysema. Electronically Signed   By: Fidela Salisbury MD   On: 05/31/2020 01:46   DG Abd 2 Views  Result Date: 06/08/2020 CLINICAL DATA:  Abdominal pain EXAM: ABDOMEN - 2 VIEW COMPARISON:  Prior abdominal radiograph 04/23/2020 FINDINGS: The bowel gas pattern is nonspecific. There is no evidence of gaseous dilation, however gas is present within multiple loops of nondilated small bowel in the left mid abdomen. Hyperinflation of the visualized lower lungs again noted. Focal degenerative disc disease present at L4-L5. IMPRESSION: 1. Nonspecific bowel gas pattern. No significant dilation or obstruction but there is gas within multiple loops of nondilated small bowel in the left mid abdomen. 2. Lower lumbar degenerative disc disease. Electronically Signed   By: Jacqulynn Cadet M.D.   On: 06/08/2020 11:08    Scheduled  Meds: . amLODipine  5 mg Oral Daily  . ARIPiprazole  30 mg Oral Daily  . aspirin EC  81 mg Oral Daily  . donepezil  5 mg Oral QHS  . doxycycline  100 mg Oral Q12H  . enoxaparin (LOVENOX) injection  40 mg Subcutaneous Q24H  . guaiFENesin  600 mg Oral BID  . insulin aspart  0-5 Units Subcutaneous QHS  . insulin aspart  0-9 Units Subcutaneous TID WC  . ipratropium-albuterol  3 mL Nebulization TID  . irbesartan  150 mg Oral Daily  . methylPREDNISolone (SOLU-MEDROL) injection  40 mg Intravenous Q12H  . pantoprazole sodium  20 mg Oral Daily  . polyethylene glycol  17 g Oral Daily  . QUEtiapine  600 mg Oral QHS  . traZODone  100 mg Oral QHS    Assessment/Plan:  1. COPD exacerbation.  Change prednisone back to Solu-Medrol.  Wheezing quite a bit this morning.  Reassess tomorrow for potential discharge.  Nebulizer treatments.  Empiric doxycycline. 2. Acute hypoxic respiratory failure.  ER physician documented a pulse ox of 84% in the emergency room.  Tapered to room air. 3. Abdominal  fullness with some gas in the lower abdomen.  Will prescribe MiraLAX to get a bowel movement. 4. Essential hypertension on Azor 5. Dementia on Aricept 6. Schizophrenia on Abilify and Seroquel 7. GERD on PPI 8. History of CAD    Code Status:     Code Status Orders  (From admission, onward)         Start     Ordered   05/20/2020 0423  Full code  Continuous        05/26/2020 0425        Code Status History    Date Active Date Inactive Code Status Order ID Comments User Context   05/21/2020 0750 05/23/2020 2342 Full Code [id number]  Ivor Costa, MD ED   05/09/2020 0345 05/10/2020 2359 Full Code [id number]  Athena Masse, MD ED   04/21/2020 0517 04/23/2020 2023 Full Code [id number]  Athena Masse, MD ED   03/16/2020 1052 03/18/2020 1810 Full Code [id number]  Collier Bullock, MD ED   03/10/2020 0438 03/11/2020 2007 Full Code [id number]  Athena Masse, MD ED   01/19/2020 0351 01/19/2020 2131 Full Code [id number]  Ivor Costa,  MD ED   06/09/2019 1009 06/12/2019 2156 Full Code [id number]  Ivor Costa, MD ED   05/24/2019 0018 05/25/2019 1827 Full Code [id number]  Jenkins Rouge, MD ED   05/20/2019 0827 05/22/2019 2049 Full Code [id number]  Ivor Costa, MD ED   08/21/2018 0446 08/22/2018 2048 Full Code [id number]  Harrie Foreman, MD Inpatient   Advance Care Planning Activity     Family Communication: Left message for group home Disposition Plan: Status is: Observation  Dispo: The patient is from: Group home              Anticipated d/c is to: Group home probably tomorrow on 06/09/2024              Patient currently had diffuse wheezing this morning and abdominal discomfort   Difficult to place patient.  No.  Antibiotics:  Doxycycline  Time spent: 28 minutes  Ashley

## 2020-06-08 NOTE — Progress Notes (Signed)
Patient complained of shortness of breath and wheezing throughout the night.  He was given prn breathing treatments times 3.  Patient is sating at 96% on 2L.  Placed him on 2L after episodes of shortness of breath.  Will continue to monitor.  Christene Slates

## 2020-06-09 DIAGNOSIS — F0390 Unspecified dementia without behavioral disturbance: Secondary | ICD-10-CM | POA: Diagnosis not present

## 2020-06-09 DIAGNOSIS — R109 Unspecified abdominal pain: Secondary | ICD-10-CM | POA: Diagnosis not present

## 2020-06-09 DIAGNOSIS — J441 Chronic obstructive pulmonary disease with (acute) exacerbation: Secondary | ICD-10-CM | POA: Diagnosis not present

## 2020-06-09 DIAGNOSIS — J9601 Acute respiratory failure with hypoxia: Secondary | ICD-10-CM | POA: Diagnosis not present

## 2020-06-09 LAB — D-DIMER, QUANTITATIVE: D-Dimer, Quant: <0.27 ug/mL-FEU (ref 0.00–0.50)

## 2020-06-09 LAB — BASIC METABOLIC PANEL
Anion gap: 9 (ref 5–15)
BUN: 19 mg/dL (ref 8–23)
CO2: 33 mmol/L — ABNORMAL HIGH (ref 22–32)
Calcium: 9.9 mg/dL (ref 8.9–10.3)
Chloride: 94 mmol/L — ABNORMAL LOW (ref 98–111)
Creatinine, Ser: 0.82 mg/dL (ref 0.61–1.24)
GFR, Estimated: >60 mL/min (ref >60)
Glucose, Bld: 145 mg/dL — ABNORMAL HIGH (ref 70–99)
Potassium: 4.8 mmol/L (ref 3.5–5.1)
Sodium: 136 mmol/L (ref 135–145)

## 2020-06-09 LAB — CBC
HCT: 46.2 % (ref 39.0–52.0)
Hemoglobin: 15 g/dL (ref 13.0–17.0)
MCH: 30.2 pg (ref 26.0–34.0)
MCHC: 32.5 g/dL (ref 30.0–36.0)
MCV: 93 fL (ref 80.0–100.0)
Platelets: 197 10*3/uL (ref 150–400)
RBC: 4.97 MIL/uL (ref 4.22–5.81)
RDW: 13.9 % (ref 11.5–15.5)
WBC: 15.3 10*3/uL — ABNORMAL HIGH (ref 4.0–10.5)
nRBC: 0 % (ref 0.0–0.2)

## 2020-06-09 LAB — TROPONIN I (HIGH SENSITIVITY)
Troponin I (High Sensitivity): 17 ng/L (ref <18)
Troponin I (High Sensitivity): 17 ng/L (ref <18)

## 2020-06-09 LAB — GLUCOSE, CAPILLARY
Glucose-Capillary: 106 mg/dL — ABNORMAL HIGH (ref 70–99)
Glucose-Capillary: 85 mg/dL (ref 70–99)
Glucose-Capillary: 101 mg/dL — ABNORMAL HIGH (ref 70–99)
Glucose-Capillary: 120 mg/dL — ABNORMAL HIGH (ref 70–99)

## 2020-06-09 MED ORDER — LORAZEPAM 2 MG/ML IJ SOLN
0.5000 mg | INTRAMUSCULAR | Status: AC | PRN
  Administered 2020-06-09 – 2020-06-10 (×2): 0.5 mg via INTRAVENOUS
  Filled 2020-06-09 (×2): qty 1

## 2020-06-09 NOTE — Progress Notes (Signed)
Mobility Specialist - Progress Note   06/09/20 1300  Mobility  Activity Ambulated in hall  Level of Assistance Standby assist, set-up cues, supervision of patient - no hands on  Assistive Device None  Distance Ambulated (ft) 40 ft  Mobility Ambulated with assistance in hallway  Mobility Response Tolerated well  Mobility performed by Mobility specialist  $Mobility charge 1 Mobility    O2 while resting on RA = 95% O2 while AMB on RA = 83% O2 while AMB on 4L = 96%   Pt ambulated 40' in hallway. No LOB. Pt desat to 83% while ambulating on RA. Mobility deferred further distance and initiated seated rest break. PLB engaged, but with poor follow-through. Labored breathing. O2 increased to 3L with sats ranging 86-88%. O2 bumped up to 4L to get sats to rebound to mid 90s. RN notified.   Kathee Delton Mobility Specialist 06/09/20, 1:29 PM

## 2020-06-09 NOTE — Progress Notes (Signed)
[name], male   DOB: 05/28/1950, 70 y.o.   MRN: [id number] [facility] PROGRESS NOTE  Carlos Nelson JSE:[id number] DOB: 04-07-1950 DOA: 05/30/2020 PCP: [facility]  HPI/Subjective: Patient seen this morning and was having audible wheezing again.  Slight cough.  Some shortness of breath.  Admitted with COPD exacerbation.  Objective: Vitals:   06/09/20 1346 06/09/20 1538  BP:  (!) 154/97  Pulse:  (!) 105  Resp:  (!) 24  Temp:  98.8 F (37.1 C)  SpO2: 98% 100%    Intake/Output Summary (Last 24 hours) at 06/09/2020 1559 Last data filed at 06/09/2020 1359 Gross per 24 hour  Intake 540 ml  Output 2700 ml  Net -2160 ml   Filed Weights   05/15/2020 0028  Weight: 59.4 kg    ROS: Review of Systems  Respiratory: Positive for cough and shortness of breath.   Cardiovascular: Negative for chest pain.  Gastrointestinal: Negative for abdominal pain, nausea and vomiting.   Exam: Physical Exam HENT:     Head: Normocephalic.     Mouth/Throat:     Pharynx: No oropharyngeal exudate.  Eyes:     General: Lids are normal.     Conjunctiva/sclera: Conjunctivae normal.     Pupils: Pupils are equal, round, and reactive to light.  Cardiovascular:     Rate and Rhythm: Normal rate and regular rhythm.     Heart sounds: Normal heart sounds, S1 normal and S2 normal.  Pulmonary:     Breath sounds: Examination of the right-middle field reveals decreased breath sounds and wheezing. Examination of the left-middle field reveals decreased breath sounds and wheezing. Examination of the right-lower field reveals decreased breath sounds and wheezing. Examination of the left-lower field reveals decreased breath sounds and wheezing. Decreased breath sounds and wheezing present. No rhonchi or rales.  Abdominal:     Palpations: Abdomen is soft.     Tenderness: There is no abdominal tenderness.  Musculoskeletal:     Right lower leg: No swelling.     Left lower leg: No  swelling.  Skin:    General: Skin is warm.     Findings: No rash.  Neurological:     Mental Status: He is alert.       Data Reviewed: Basic Metabolic Panel: Recent Labs  Lab 06/04/20 1509 05/31/2020 0036 06/12/2020 0621  NA 140 139 139  K 4.4 4.4 4.7  CL 101 102 100  CO2 31 28 29   GLUCOSE 121* 126* 175*  BUN 10 18 17   CREATININE 0.72 1.22 1.04  CALCIUM 9.6 9.0 9.5   CBC: Recent Labs  Lab 06/04/20 1319 05/15/2020 0036 05/24/2020 0621  WBC 14.6* 9.9 7.8  NEUTROABS 12.9* 7.6  --   HGB 14.8 13.7 15.2  HCT 45.4 42.4 47.0  MCV 91.3 91.8 92.2  PLT 226 200 181   BNP (last 3 results) Recent Labs    05/09/20 0043 05/22/20 0719 06/04/20 1319  BNP 25.4 85.8 24.8    CBG: Recent Labs  Lab 06/08/20 1126 06/08/20 1724 06/08/20 2106 06/09/20 0730 06/09/20 1145  GLUCAP 105* 176* 130* 106* 85    Recent Results (from the past 240 hour(s))  Resp Panel by RT-PCR (Flu A&B, Covid) Nasopharyngeal Swab     Status: None   Collection Time: 06/04/20  1:19 PM   Specimen: Nasopharyngeal Swab; Nasopharyngeal(NP) swabs in vial transport medium  Result Value Ref Range Status   SARS Coronavirus 2 by RT PCR NEGATIVE NEGATIVE Final  Comment: (NOTE) SARS-CoV-2 target nucleic acids are NOT DETECTED.  The SARS-CoV-2 RNA is generally detectable in upper respiratory specimens during the acute phase of infection. The lowest concentration of SARS-CoV-2 viral copies this assay can detect is 138 copies/mL. A negative result does not preclude SARS-Cov-2 infection and should not be used as the sole basis for treatment or other patient management decisions. A negative result may occur with  improper specimen collection/handling, submission of specimen other than nasopharyngeal swab, presence of viral mutation(s) within the areas targeted by this assay, and inadequate number of viral copies(<138 copies/mL). A negative result must be combined with clinical observations, patient history, and  epidemiological information. The expected result is Negative.  Fact Sheet for Patients:  EntrepreneurPulse.com.au  Fact Sheet for Healthcare Providers:  IncredibleEmployment.be  This test is no t yet approved or cleared by the [place] FDA and  has been authorized for detection and/or diagnosis of SARS-CoV-2 by FDA under an Emergency Use Authorization (EUA). This EUA will remain  in effect (meaning this test can be used) for the duration of the COVID-19 declaration under Section 564(b)(1) of the Act, 21 U.S.C.section 360bbb-3(b)(1), unless the authorization is terminated  or revoked sooner.       Influenza A by PCR NEGATIVE NEGATIVE Final   Influenza B by PCR NEGATIVE NEGATIVE Final    Comment: (NOTE) The Xpert Xpress SARS-CoV-2/FLU/RSV plus assay is intended as an aid in the diagnosis of influenza from Nasopharyngeal swab specimens and should not be used as a sole basis for treatment. Nasal washings and aspirates are unacceptable for Xpert Xpress SARS-CoV-2/FLU/RSV testing.  Fact Sheet for Patients: EntrepreneurPulse.com.au  Fact Sheet for Healthcare Providers: IncredibleEmployment.be  This test is not yet approved or cleared by the [place] FDA and has been authorized for detection and/or diagnosis of SARS-CoV-2 by FDA under an Emergency Use Authorization (EUA). This EUA will remain in effect (meaning this test can be used) for the duration of the COVID-19 declaration under Section 564(b)(1) of the Act, 21 U.S.C. section 360bbb-3(b)(1), unless the authorization is terminated or revoked.  Performed at [facility], [address] [place], [place] [address]   Resp Panel by RT-PCR (Flu A&B, Covid) Nasopharyngeal Swab     Status: None   Collection Time: 06/11/2020  4:28 AM   Specimen: Nasopharyngeal Swab; Nasopharyngeal(NP) swabs in vial transport medium  Result Value Ref Range  Status   SARS Coronavirus 2 by RT PCR NEGATIVE NEGATIVE Final    Comment: (NOTE) SARS-CoV-2 target nucleic acids are NOT DETECTED.  The SARS-CoV-2 RNA is generally detectable in upper respiratory specimens during the acute phase of infection. The lowest concentration of SARS-CoV-2 viral copies this assay can detect is 138 copies/mL. A negative result does not preclude SARS-Cov-2 infection and should not be used as the sole basis for treatment or other patient management decisions. A negative result may occur with  improper specimen collection/handling, submission of specimen other than nasopharyngeal swab, presence of viral mutation(s) within the areas targeted by this assay, and inadequate number of viral copies(<138 copies/mL). A negative result must be combined with clinical observations, patient history, and epidemiological information. The expected result is Negative.  Fact Sheet for Patients:  EntrepreneurPulse.com.au  Fact Sheet for Healthcare Providers:  IncredibleEmployment.be  This test is no t yet approved or cleared by the [place] FDA and  has been authorized for detection and/or diagnosis of SARS-CoV-2 by FDA under an Emergency Use Authorization (EUA). This EUA will remain  in effect (  meaning this test can be used) for the duration of the COVID-19 declaration under Section 564(b)(1) of the Act, 21 U.S.C.section 360bbb-3(b)(1), unless the authorization is terminated  or revoked sooner.       Influenza A by PCR NEGATIVE NEGATIVE Final   Influenza B by PCR NEGATIVE NEGATIVE Final    Comment: (NOTE) The Xpert Xpress SARS-CoV-2/FLU/RSV plus assay is intended as an aid in the diagnosis of influenza from Nasopharyngeal swab specimens and should not be used as a sole basis for treatment. Nasal washings and aspirates are unacceptable for Xpert Xpress SARS-CoV-2/FLU/RSV testing.  Fact Sheet for  Patients: EntrepreneurPulse.com.au  Fact Sheet for Healthcare Providers: IncredibleEmployment.be  This test is not yet approved or cleared by the [place] FDA and has been authorized for detection and/or diagnosis of SARS-CoV-2 by FDA under an Emergency Use Authorization (EUA). This EUA will remain in effect (meaning this test can be used) for the duration of the COVID-19 declaration under Section 564(b)(1) of the Act, 21 U.S.C. section 360bbb-3(b)(1), unless the authorization is terminated or revoked.  Performed at [facility], [address] [place], [place] [address]      Studies: DG Abd 2 Views  Result Date: 06/08/2020 CLINICAL DATA:  Abdominal pain EXAM: ABDOMEN - 2 VIEW COMPARISON:  Prior abdominal radiograph 04/23/2020 FINDINGS: The bowel gas pattern is nonspecific. There is no evidence of gaseous dilation, however gas is present within multiple loops of nondilated small bowel in the left mid abdomen. Hyperinflation of the visualized lower lungs again noted. Focal degenerative disc disease present at L4-L5. IMPRESSION: 1. Nonspecific bowel gas pattern. No significant dilation or obstruction but there is gas within multiple loops of nondilated small bowel in the left mid abdomen. 2. Lower lumbar degenerative disc disease. Electronically Signed   By: Jacqulynn Cadet M.D.   On: 06/08/2020 11:08    Scheduled Meds: . amLODipine  5 mg Oral Daily  . ARIPiprazole  30 mg Oral Daily  . aspirin EC  81 mg Oral Daily  . donepezil  5 mg Oral QHS  . doxycycline  100 mg Oral Q12H  . enoxaparin (LOVENOX) injection  40 mg Subcutaneous Q24H  . guaiFENesin  600 mg Oral BID  . insulin aspart  0-5 Units Subcutaneous QHS  . insulin aspart  0-9 Units Subcutaneous TID WC  . ipratropium-albuterol  3 mL Nebulization TID  . irbesartan  150 mg Oral Daily  . methylPREDNISolone (SOLU-MEDROL) injection  40 mg Intravenous Q12H  . pantoprazole sodium   20 mg Oral Daily  . polyethylene glycol  17 g Oral Daily  . QUEtiapine  600 mg Oral QHS  . traZODone  100 mg Oral QHS   Continuous Infusions:  Assessment/Plan:   1. COPD exacerbation.  Continue Solu-Medrol.  This morning again wheezing quite a bit.  Reassess on a daily basis on when to go home.  Continue nebulizer treatments and empiric doxycycline. 2. Acute hypoxic respiratory failure.  ER physician documented a pulse ox of 84% in the emergency room.  Every time I go back in the room he is wearing the oxygen.  I do see a pulse ox of 100% on room air documented this afternoon. 3. Abdominal pain and fullness.  Patient had some gas in his lower abdomen I gave MiraLAX and he had a bowel movement last night and feels better with regards to his abdomen today. 4. Essential hypertension on Azor 5. Dementia on Aricept 6. Schizophrenia on Abilify and Seroquel 7. GERD on PPI  8. History of CAD 9. Tobacco abuse.  Patient smokes about 4 to 5 cigarettes a day but when people bring him cigarettes he may smoke a whole pack.        Code Status:     Code Status Orders  (From admission, onward)         Start     Ordered   05/19/2020 0423  Full code  Continuous        06/04/2020 0425        Code Status History    Date Active Date Inactive Code Status Order ID Comments User Context   05/21/2020 0750 05/23/2020 2342 Full Code [id number]  Ivor Costa, MD ED   05/09/2020 0345 05/10/2020 2359 Full Code [id number]  Athena Masse, MD ED   04/21/2020 0517 04/23/2020 2023 Full Code [id number]  Athena Masse, MD ED   03/16/2020 1052 03/18/2020 1810 Full Code [id number]  Collier Bullock, MD ED   03/10/2020 0438 03/11/2020 2007 Full Code [id number]  Athena Masse, MD ED   01/19/2020 0351 01/19/2020 2131 Full Code [id number]  Ivor Costa, MD ED   06/09/2019 1009 06/12/2019 2156 Full Code [id number]  Ivor Costa, MD ED   05/24/2019 0018 05/25/2019 1827 Full Code [id number]  Jenkins Rouge, MD ED   05/20/2019 0827 05/22/2019 2049 Full  Code [id number]  Ivor Costa, MD ED   08/21/2018 0446 08/22/2018 2048 Full Code [id number]  Harrie Foreman, MD Inpatient   Advance Care Planning Activity     Family Communication: Spoke with Tomi Bamberger on the phone Disposition Plan: Status is: Inpatient  Dispo: The patient is from: Group home              Anticipated d/c is to: Group home              Patient currently being treated with IV Solu-Medrol for COPD exacerbation   Difficult to place patient.  No.  Time spent: 27 minutes  [place]

## 2020-06-09 NOTE — Progress Notes (Signed)
Mobility Specialist - Progress Note   06/09/20 1100  Mobility  Activity Ambulated in hall  Level of Assistance Standby assist, set-up cues, supervision of patient - no hands on  Assistive Device None  Distance Ambulated (ft) 180 ft  Mobility Ambulated with assistance in hallway  Mobility Response Tolerated well  Mobility performed by Mobility specialist  $Mobility charge 1 Mobility    Pre-mobility: 109 HR, 96% SpO2 During mobility: 113 HR, 91% SpO2 Post-mobility: 107 HR, 94% SpO2   Pt lying in bed on arrival. Voiced chest pain 7/10 and abdominal pain 6/10 prior to activity. Ambulated hallway with supervision. 1 standing rest break taken, initiated by mobility tech. Pt reports abdominal pain increasing to an 8/10 with activity. Voiced SOB during ambulation, O2 desat to a low of 90% on 3L. PLB engaged. Labored breathing noted. Feeling winded after activity. RPE 10/10 with breathing management reported as most difficult part of session. Pt motivated to ambulate later this evening, will attempt as time permits.    Kathee Delton Mobility Specialist 06/09/20, 11:41 AM

## 2020-06-10 ENCOUNTER — Inpatient Hospital Stay: Payer: Medicare Other

## 2020-06-10 ENCOUNTER — Encounter: Payer: Self-pay | Admitting: Internal Medicine

## 2020-06-10 DIAGNOSIS — J9601 Acute respiratory failure with hypoxia: Secondary | ICD-10-CM | POA: Diagnosis not present

## 2020-06-10 DIAGNOSIS — F0390 Unspecified dementia without behavioral disturbance: Secondary | ICD-10-CM | POA: Diagnosis not present

## 2020-06-10 DIAGNOSIS — R109 Unspecified abdominal pain: Secondary | ICD-10-CM | POA: Diagnosis not present

## 2020-06-10 DIAGNOSIS — J441 Chronic obstructive pulmonary disease with (acute) exacerbation: Secondary | ICD-10-CM | POA: Diagnosis not present

## 2020-06-10 DIAGNOSIS — K219 Gastro-esophageal reflux disease without esophagitis: Secondary | ICD-10-CM

## 2020-06-10 LAB — BLOOD GAS, ARTERIAL
Acid-Base Excess: 7.8 mmol/L — ABNORMAL HIGH (ref 0.0–2.0)
Bicarbonate: 35.9 mmol/L — ABNORMAL HIGH (ref 20.0–28.0)
FIO2: 0.44
O2 Saturation: 97.6 %
Patient temperature: 37
pCO2 arterial: 65 mmHg — ABNORMAL HIGH (ref 32.0–48.0)
pH, Arterial: 7.35 (ref 7.350–7.450)
pO2, Arterial: 102 mmHg (ref 83.0–108.0)

## 2020-06-10 LAB — GLUCOSE, CAPILLARY
Glucose-Capillary: 114 mg/dL — ABNORMAL HIGH (ref 70–99)
Glucose-Capillary: 122 mg/dL — ABNORMAL HIGH (ref 70–99)
Glucose-Capillary: 124 mg/dL — ABNORMAL HIGH (ref 70–99)
Glucose-Capillary: 142 mg/dL — ABNORMAL HIGH (ref 70–99)

## 2020-06-10 MED ORDER — CHLORHEXIDINE GLUCONATE CLOTH 2 % EX PADS
6.0000 | MEDICATED_PAD | Freq: Every day | CUTANEOUS | Status: DC
  Administered 2020-06-11 – 2020-06-15 (×5): 6 via TOPICAL

## 2020-06-10 MED ORDER — BUDESONIDE 0.5 MG/2ML IN SUSP
0.5000 mg | Freq: Two times a day (BID) | RESPIRATORY_TRACT | Status: DC
  Administered 2020-06-10 – 2020-06-13 (×7): 0.5 mg via RESPIRATORY_TRACT
  Filled 2020-06-10 (×7): qty 2

## 2020-06-10 MED ORDER — DILTIAZEM HCL 25 MG/5ML IV SOLN: 5.0000 mg | Freq: Once | INTRAVENOUS | Status: DC

## 2020-06-10 MED ORDER — METHYLPREDNISOLONE SODIUM SUCC 40 MG IJ SOLR
40.0000 mg | Freq: Four times a day (QID) | INTRAMUSCULAR | Status: DC
  Administered 2020-06-10 – 2020-06-12 (×7): 40 mg via INTRAVENOUS
  Filled 2020-06-10 (×7): qty 1

## 2020-06-10 MED ORDER — LACTATED RINGERS IV BOLUS
500.0000 mL | Freq: Once | INTRAVENOUS | Status: AC
  Administered 2020-06-11: 500 mL via INTRAVENOUS

## 2020-06-10 MED ORDER — ACETYLCYSTEINE 20 % IN SOLN
3.0000 mL | Freq: Two times a day (BID) | RESPIRATORY_TRACT | Status: DC
  Administered 2020-06-10: 3 mL via RESPIRATORY_TRACT
  Administered 2020-06-11: 4 mL via RESPIRATORY_TRACT
  Filled 2020-06-10 (×4): qty 4

## 2020-06-10 MED ORDER — MORPHINE SULFATE (CONCENTRATE) 10 MG/0.5ML PO SOLN
5.0000 mg | ORAL | Status: DC | PRN
Start: 2020-06-10 — End: 2020-06-11
  Administered 2020-06-10: 5 mg via ORAL
  Filled 2020-06-10: qty 0.5

## 2020-06-10 MED ORDER — IPRATROPIUM-ALBUTEROL 0.5-2.5 (3) MG/3ML IN SOLN
3.0000 mL | RESPIRATORY_TRACT | Status: DC
  Administered 2020-06-10 – 2020-06-11 (×6): 3 mL via RESPIRATORY_TRACT
  Filled 2020-06-10 (×6): qty 3

## 2020-06-10 NOTE — Plan of Care (Signed)

## 2020-06-10 NOTE — Progress Notes (Addendum)
Mobility Specialist - Progress Note   06/10/20 1200  Mobility  Activity Transferred:  Bed to chair  Level of Assistance Standby assist, set-up cues, supervision of patient - no hands on  Assistive Device None  Distance Ambulated (ft) 3 ft  Mobility Out of bed to chair with meals  Mobility Response Tolerated fair  Mobility performed by Mobility specialist  $Mobility charge 1 Mobility    Pt transferred from bed-chair. O2 on 4L and fluctuating 87-90% on arrival. Mobility bumped up O2 to 5L to get sats to maintain 92%. Pt left on 5L prior to exit. Labored breathing noted. Pt incontinent of urine this date. RN notified.    Kathee Delton Mobility Specialist 06/10/20, 12:53 PM

## 2020-06-10 NOTE — Progress Notes (Signed)
Pt assessed, pt in resp distress upon arrival neb tx given. Pt placed on bipap rapid response called,cxr obtained abg drawn..  Ph 7.28 co2 81 pao2 127 BE 7.8 hco3 38.1 Bedside RN covering NP made aware, plans for pt to tx. Pt states to breathing somewhat easier post interventions. Will continue to monitor closely.

## 2020-06-10 NOTE — Progress Notes (Signed)
   06/10/20 2013  Assess: MEWS Score  BP (!) 211/107  Pulse Rate (!) 117  Resp (!) 28  Level of Consciousness Alert  SpO2 97 %  O2 Device Aerosol Mask  Assess: MEWS Score  MEWS Temp 0  MEWS Systolic 2  MEWS Pulse 2  MEWS RR 2  MEWS LOC 0  MEWS Score 6  MEWS Score Color Red  Assess: if the MEWS score is Yellow or Red  Were vital signs taken at a resting state? Yes  Focused Assessment Change from prior assessment (see assessment flowsheet)  Early Detection of Sepsis Score *See Row Information* Low  MEWS guidelines implemented *See Row Information* Yes  Treat  MEWS Interventions Consulted Respiratory Therapy  Pain Scale 0-10  Pain Score 0  Patients response to intervention Unchanged  Escalate  MEWS: Escalate Red: discuss with charge nurse/RN and provider, consider discussing with RRT  Notify: Charge Nurse/RN  Name of Charge Nurse/RN Notified Tricia RN  Date Charge Nurse/RN Notified 06/10/20  Time Charge Nurse/RN Notified 2013  Notify: Provider  Provider Name/Title Blount NP  Date Provider Notified 06/10/20  Time Provider Notified 2020  Notification Type Call  Notification Reason Change in status  Provider response Evaluate remotely  Date of Provider Response 06/10/20  Time of Provider Response 2020  Notify: Rapid Response  Name of Rapid Response RN Notified Danae Chen RN  Date Rapid Response Notified 06/10/20  Time Rapid Response Notified 2030  Document  Patient Outcome Transferred/level of care increased  Progress note created (see row info) Yes

## 2020-06-10 NOTE — Progress Notes (Signed)
[name], male   DOB: 16-Apr-1950, 70 y.o.   MRN: [id number] [facility] PROGRESS NOTE  SKIP LITKE WUJ:[id number] DOB: [birth year] DOA: 05/27/2020 PCP: [facility]  HPI/Subjective: Patient short of breath this morning.  Audible wheeze heard without my stethoscope.  Some cough.  Still feels short of breath.  Admitted with COPD exacerbation  Objective: Vitals:   06/10/20 1507 06/10/20 1524  BP:  (!) 171/98  Pulse:  (!) 110  Resp:  (!) 22  Temp:  98.2 F (36.8 C)  SpO2: 95% 96%    Intake/Output Summary (Last 24 hours) at 06/10/2020 1658 Last data filed at 06/10/2020 0900 Gross per 24 hour  Intake 480 ml  Output 1000 ml  Net -520 ml   Filed Weights   06/13/2020 0028  Weight: 59.4 kg    ROS: Review of Systems  Respiratory: Positive for cough, shortness of breath and wheezing.   Cardiovascular: Negative for chest pain.  Gastrointestinal: Negative for abdominal pain, nausea and vomiting.   Exam: Physical Exam HENT:     Head: Normocephalic.     Mouth/Throat:     Pharynx: No oropharyngeal exudate.  Eyes:     General: Lids are normal.     Conjunctiva/sclera: Conjunctivae normal.     Pupils: Pupils are equal, round, and reactive to light.  Cardiovascular:     Rate and Rhythm: Normal rate and regular rhythm.     Heart sounds: Normal heart sounds, S1 normal and S2 normal.  Pulmonary:     Effort: Accessory muscle usage present.     Breath sounds: Examination of the right-upper field reveals decreased breath sounds and wheezing. Examination of the left-upper field reveals decreased breath sounds and wheezing. Examination of the right-middle field reveals decreased breath sounds and wheezing. Examination of the left-middle field reveals decreased breath sounds and wheezing. Examination of the right-lower field reveals decreased breath sounds and rhonchi. Examination of the left-lower field reveals decreased breath sounds and rhonchi.  Decreased breath sounds, wheezing and rhonchi present. No rales.  Abdominal:     Palpations: Abdomen is soft.     Tenderness: There is no abdominal tenderness.  Musculoskeletal:     Right lower leg: No swelling.     Left lower leg: No swelling.  Skin:    General: Skin is warm.     Findings: No rash.  Neurological:     Mental Status: He is alert and oriented to person, place, and time.       Data Reviewed: Basic Metabolic Panel: Recent Labs  Lab 06/04/20 1509 06/03/2020 0036 06/04/2020 0621 06/09/20 1741  NA 140 139 139 136  K 4.4 4.4 4.7 4.8  CL 101 102 100 94*  CO2 31 28 29  33*  GLUCOSE 121* 126* 175* 145*  BUN 10 18 17 19   CREATININE 0.72 1.22 1.04 0.82  CALCIUM 9.6 9.0 9.5 9.9   CBC: Recent Labs  Lab 06/04/20 1319 05/20/2020 0036 05/28/2020 0621 06/09/20 1741  WBC 14.6* 9.9 7.8 15.3*  NEUTROABS 12.9* 7.6  --   --   HGB 14.8 13.7 15.2 15.0  HCT 45.4 42.4 47.0 46.2  MCV 91.3 91.8 92.2 93.0  PLT 226 200 181 197   BNP (last 3 results) Recent Labs    05/09/20 0043 05/22/20 0719 06/04/20 1319  BNP 25.4 85.8 24.8    CBG: Recent Labs  Lab 06/09/20 1145 06/09/20 1701 06/09/20 2125 06/10/20 0741 06/10/20 1135  GLUCAP 85 101* 120* 114* 122*  Recent Results (from the past 240 hour(s))  Resp Panel by RT-PCR (Flu A&B, Covid) Nasopharyngeal Swab     Status: None   Collection Time: 06/04/20  1:19 PM   Specimen: Nasopharyngeal Swab; Nasopharyngeal(NP) swabs in vial transport medium  Result Value Ref Range Status   SARS Coronavirus 2 by RT PCR NEGATIVE NEGATIVE Final    Comment: (NOTE) SARS-CoV-2 target nucleic acids are NOT DETECTED.  The SARS-CoV-2 RNA is generally detectable in upper respiratory specimens during the acute phase of infection. The lowest concentration of SARS-CoV-2 viral copies this assay can detect is 138 copies/mL. A negative result does not preclude SARS-Cov-2 infection and should not be used as the sole basis for treatment or other  patient management decisions. A negative result may occur with  improper specimen collection/handling, submission of specimen other than nasopharyngeal swab, presence of viral mutation(s) within the areas targeted by this assay, and inadequate number of viral copies(<138 copies/mL). A negative result must be combined with clinical observations, patient history, and epidemiological information. The expected result is Negative.  Fact Sheet for Patients:  EntrepreneurPulse.com.au  Fact Sheet for Healthcare Providers:  IncredibleEmployment.be  This test is no t yet approved or cleared by the [place] FDA and  has been authorized for detection and/or diagnosis of SARS-CoV-2 by FDA under an Emergency Use Authorization (EUA). This EUA will remain  in effect (meaning this test can be used) for the duration of the COVID-19 declaration under Section 564(b)(1) of the Act, 21 U.S.C.section 360bbb-3(b)(1), unless the authorization is terminated  or revoked sooner.       Influenza A by PCR NEGATIVE NEGATIVE Final   Influenza B by PCR NEGATIVE NEGATIVE Final    Comment: (NOTE) The Xpert Xpress SARS-CoV-2/FLU/RSV plus assay is intended as an aid in the diagnosis of influenza from Nasopharyngeal swab specimens and should not be used as a sole basis for treatment. Nasal washings and aspirates are unacceptable for Xpert Xpress SARS-CoV-2/FLU/RSV testing.  Fact Sheet for Patients: EntrepreneurPulse.com.au  Fact Sheet for Healthcare Providers: IncredibleEmployment.be  This test is not yet approved or cleared by the [place] FDA and has been authorized for detection and/or diagnosis of SARS-CoV-2 by FDA under an Emergency Use Authorization (EUA). This EUA will remain in effect (meaning this test can be used) for the duration of the COVID-19 declaration under Section 564(b)(1) of the Act, 21 U.S.C. section  360bbb-3(b)(1), unless the authorization is terminated or revoked.  Performed at [facility], [address] [place], [place] [address]   Resp Panel by RT-PCR (Flu A&B, Covid) Nasopharyngeal Swab     Status: None   Collection Time: 05/26/2020  4:28 AM   Specimen: Nasopharyngeal Swab; Nasopharyngeal(NP) swabs in vial transport medium  Result Value Ref Range Status   SARS Coronavirus 2 by RT PCR NEGATIVE NEGATIVE Final    Comment: (NOTE) SARS-CoV-2 target nucleic acids are NOT DETECTED.  The SARS-CoV-2 RNA is generally detectable in upper respiratory specimens during the acute phase of infection. The lowest concentration of SARS-CoV-2 viral copies this assay can detect is 138 copies/mL. A negative result does not preclude SARS-Cov-2 infection and should not be used as the sole basis for treatment or other patient management decisions. A negative result may occur with  improper specimen collection/handling, submission of specimen other than nasopharyngeal swab, presence of viral mutation(s) within the areas targeted by this assay, and inadequate number of viral copies(<138 copies/mL). A negative result must be combined with clinical observations, patient history, and epidemiological  information. The expected result is Negative.  Fact Sheet for Patients:  EntrepreneurPulse.com.au  Fact Sheet for Healthcare Providers:  IncredibleEmployment.be  This test is no t yet approved or cleared by the [place] FDA and  has been authorized for detection and/or diagnosis of SARS-CoV-2 by FDA under an Emergency Use Authorization (EUA). This EUA will remain  in effect (meaning this test can be used) for the duration of the COVID-19 declaration under Section 564(b)(1) of the Act, 21 U.S.C.section 360bbb-3(b)(1), unless the authorization is terminated  or revoked sooner.       Influenza A by PCR NEGATIVE NEGATIVE Final   Influenza B by PCR  NEGATIVE NEGATIVE Final    Comment: (NOTE) The Xpert Xpress SARS-CoV-2/FLU/RSV plus assay is intended as an aid in the diagnosis of influenza from Nasopharyngeal swab specimens and should not be used as a sole basis for treatment. Nasal washings and aspirates are unacceptable for Xpert Xpress SARS-CoV-2/FLU/RSV testing.  Fact Sheet for Patients: EntrepreneurPulse.com.au  Fact Sheet for Healthcare Providers: IncredibleEmployment.be  This test is not yet approved or cleared by the [place] FDA and has been authorized for detection and/or diagnosis of SARS-CoV-2 by FDA under an Emergency Use Authorization (EUA). This EUA will remain in effect (meaning this test can be used) for the duration of the COVID-19 declaration under Section 564(b)(1) of the Act, 21 U.S.C. section 360bbb-3(b)(1), unless the authorization is terminated or revoked.  Performed at [facility], [address] [place], [place] [address]      Studies: DG Chest 2 View  Result Date: 06/10/2020 CLINICAL DATA:  Shortness of breath. EXAM: CHEST - 2 VIEW COMPARISON:  Jun 07, 2020. FINDINGS: The heart size and mediastinal contours are within normal limits. Both lungs are clear. The visualized skeletal structures are unremarkable. IMPRESSION: No active cardiopulmonary disease. Electronically Signed   By: Marijo Conception M.D.   On: 06/10/2020 08:50    Scheduled Meds: . acetylcysteine  3 mL Nebulization BID  . amLODipine  5 mg Oral Daily  . ARIPiprazole  30 mg Oral Daily  . aspirin EC  81 mg Oral Daily  . budesonide (PULMICORT) nebulizer solution  0.5 mg Nebulization BID  . donepezil  5 mg Oral QHS  . doxycycline  100 mg Oral Q12H  . enoxaparin (LOVENOX) injection  40 mg Subcutaneous Q24H  . guaiFENesin  600 mg Oral BID  . insulin aspart  0-5 Units Subcutaneous QHS  . insulin aspart  0-9 Units Subcutaneous TID WC  . ipratropium-albuterol  3 mL Nebulization Q4H  .  irbesartan  150 mg Oral Daily  . methylPREDNISolone (SOLU-MEDROL) injection  40 mg Intravenous Q6H  . pantoprazole sodium  20 mg Oral Daily  . polyethylene glycol  17 g Oral Daily  . QUEtiapine  600 mg Oral QHS  . traZODone  100 mg Oral QHS    Assessment/Plan:  1. COPD exacerbation.  Increase Solu-Medrol 40 mg every 6 hours.  Continue nebulizer treatments.  Added Mucomyst and budesonide nebulizers.  On empiric doxycycline.  Repeat chest x-ray negative.  D-dimer negative.  Add Roxanol for air hunger. 2. Acute hypoxic hypercarbic respiratory failure.  ABG shows a pH of 7.35, PCO2 of 65 PO2 of 102 on 3 L oxygen.  Patient is a candidate for noninvasive ventilation.  We will start BiPAP at night.  This will also help with better air entry. 3. Abdominal pain and fullness has resolved 4. Essential hypertension on Azor 5. Dementia on Aricept 6. Schizophrenia on  Abilify and Seroquel 7. GERD on PPI 8. History of CAD 9. Tobacco abuse      Code Status:     Code Status Orders  (From admission, onward)         Start     Ordered   06/09/2020 0423  Full code  Continuous        05/16/2020 0425        Code Status History    Date Active Date Inactive Code Status Order ID Comments User Context   05/21/2020 0750 05/23/2020 2342 Full Code [id number]  Ivor Costa, MD ED   05/09/2020 0345 05/10/2020 2359 Full Code [id number]  Athena Masse, MD ED   04/21/2020 0517 04/23/2020 2023 Full Code [id number]  Athena Masse, MD ED   03/16/2020 1052 03/18/2020 1810 Full Code [id number]  Collier Bullock, MD ED   03/10/2020 0438 03/11/2020 2007 Full Code [id number]  Athena Masse, MD ED   01/19/2020 0351 01/19/2020 2131 Full Code [id number]  Ivor Costa, MD ED   06/09/2019 1009 06/12/2019 2156 Full Code [id number]  Ivor Costa, MD ED   05/24/2019 0018 05/25/2019 1827 Full Code [id number]  Jenkins Rouge, MD ED   05/20/2019 0827 05/22/2019 2049 Full Code [id number]  Ivor Costa, MD ED   08/21/2018 0446 08/22/2018 2048 Full Code [id number]   Harrie Foreman, MD Inpatient   Advance Care Planning Activity     Family Communication: Spoke with Angelena Sole on the phone Disposition Plan: Status is: Inpatient  Dispo: The patient is from: Group home              Anticipated d/c is to: Group home              Patient currently still with COPD exacerbation   Difficult to place patient.  No.  Time spent: 27 minutes  [place]

## 2020-06-10 NOTE — Progress Notes (Signed)
Pt sitting on the edge of the bed; pt said he just been to the bathroom. pt tachypniec, breathing labored & accessory muscle use noted. Oxygen sat at 92-94% at 4lpm/[place]. Breathing tx given. Educate on importance of keeping o2 on. Dr. Leslye Peer on the floor and notified.

## 2020-06-10 NOTE — Progress Notes (Signed)
Chaplain responded to rapid response, prayer for patient.

## 2020-06-10 NOTE — Care Management Important Message (Signed)
Important Message  Patient Details  Name: Carlos Nelson MRN: [id number] Date of Birth: [birth year]   Medicare Important Message Given:  Yes     [name] [name] 06/10/2020, 2:37 PM

## 2020-06-10 NOTE — Progress Notes (Signed)
Pt scored a 9 on the RT protocol  Assessment. Despite this score, his lungs are more tight and wheezy. The nebulizer treatments are to be changed from TID to Q4.

## 2020-06-10 NOTE — Progress Notes (Addendum)
Went to pt's room, was sitting on the edge of the bed; pt said he just been to the bathroom. pt tachypniec, breathing labored & accessory muscle use noted. Oxygen sat at 92-94% at 4lpm/[place]. Breathing tx given and mucinex for his cough. Pt said its bit hard to cough out his phlegm but able to get some out while a was in his room. Pt fell asleep while having breathing tx. Oncall provider informed. Will cont to monitor.

## 2020-06-10 NOTE — Progress Notes (Signed)
Pt restless and anxious. Complaining of shortness breath and chest pain. Lungs sounds wheezing noted. Oxygen saturation at 96%% on 3lpm/[place]. Breathing treatment given. Dr. Damita Dunnings informed. Lorazepam IV given as ordered.

## 2020-06-10 NOTE — Progress Notes (Signed)
Rapid response called, pt in distress RT present, VS taken and documented yellow/red MEWS, notified [place] NP of situation, orders taken, BIPAP applied per RT, pending results. Report called to [facility] in stepdown for transfer to continue care

## 2020-06-11 ENCOUNTER — Inpatient Hospital Stay: Payer: Medicare Other

## 2020-06-11 ENCOUNTER — Encounter: Payer: Self-pay | Admitting: Internal Medicine

## 2020-06-11 LAB — COMPREHENSIVE METABOLIC PANEL
ALT: 50 U/L — ABNORMAL HIGH (ref 0–44)
AST: 50 U/L — ABNORMAL HIGH (ref 15–41)
Albumin: 3.6 g/dL (ref 3.5–5.0)
Alkaline Phosphatase: 64 U/L (ref 38–126)
Anion gap: 8 (ref 5–15)
BUN: 22 mg/dL (ref 8–23)
CO2: 34 mmol/L — ABNORMAL HIGH (ref 22–32)
Calcium: 9.2 mg/dL (ref 8.9–10.3)
Chloride: 92 mmol/L — ABNORMAL LOW (ref 98–111)
Creatinine, Ser: 0.98 mg/dL (ref 0.61–1.24)
GFR, Estimated: >60 mL/min (ref >60)
Glucose, Bld: 134 mg/dL — ABNORMAL HIGH (ref 70–99)
Potassium: 5.4 mmol/L — ABNORMAL HIGH (ref 3.5–5.1)
Sodium: 134 mmol/L — ABNORMAL LOW (ref 135–145)
Total Bilirubin: 0.8 mg/dL (ref 0.3–1.2)
Total Protein: 6.1 g/dL — ABNORMAL LOW (ref 6.5–8.1)

## 2020-06-11 LAB — CBC WITH DIFFERENTIAL/PLATELET
Abs Immature Granulocytes: 0.07 10*3/uL (ref 0.00–0.07)
Basophils Absolute: 0 10*3/uL (ref 0.0–0.1)
Basophils Relative: 0 %
Eosinophils Absolute: 0 10*3/uL (ref 0.0–0.5)
Eosinophils Relative: 0 %
HCT: 42.5 % (ref 39.0–52.0)
Hemoglobin: 13.5 g/dL (ref 13.0–17.0)
Immature Granulocytes: 1 %
Lymphocytes Relative: 3 %
Lymphs Abs: 0.3 10*3/uL — ABNORMAL LOW (ref 0.7–4.0)
MCH: 29.9 pg (ref 26.0–34.0)
MCHC: 31.8 g/dL (ref 30.0–36.0)
MCV: 94.2 fL (ref 80.0–100.0)
Monocytes Absolute: 0.4 10*3/uL (ref 0.1–1.0)
Monocytes Relative: 3 %
Neutro Abs: 9.8 10*3/uL — ABNORMAL HIGH (ref 1.7–7.7)
Neutrophils Relative %: 93 %
Platelets: 141 10*3/uL — ABNORMAL LOW (ref 150–400)
RBC: 4.51 MIL/uL (ref 4.22–5.81)
RDW: 13.4 % (ref 11.5–15.5)
WBC: 10.5 10*3/uL (ref 4.0–10.5)
nRBC: 0 % (ref 0.0–0.2)

## 2020-06-11 LAB — BLOOD GAS, ARTERIAL
Acid-Base Excess: 6.5 mmol/L — ABNORMAL HIGH (ref 0.0–2.0)
Bicarbonate: 35 mmol/L — ABNORMAL HIGH (ref 20.0–28.0)
FIO2: 0.5
MECHVT: 450 mL
O2 Saturation: 96.6 %
PEEP: 8 cmH2O
Patient temperature: 37
RATE: 20 resp/min
pCO2 arterial: 68 mmHg (ref 32.0–48.0)
pH, Arterial: 7.32 — ABNORMAL LOW (ref 7.350–7.450)
pO2, Arterial: 93 mmHg (ref 83.0–108.0)

## 2020-06-11 LAB — GLUCOSE, CAPILLARY
Glucose-Capillary: 175 mg/dL — ABNORMAL HIGH (ref 70–99)
Glucose-Capillary: 175 mg/dL — ABNORMAL HIGH (ref 70–99)
Glucose-Capillary: 163 mg/dL — ABNORMAL HIGH (ref 70–99)
Glucose-Capillary: 154 mg/dL — ABNORMAL HIGH (ref 70–99)
Glucose-Capillary: 119 mg/dL — ABNORMAL HIGH (ref 70–99)
Glucose-Capillary: 145 mg/dL — ABNORMAL HIGH (ref 70–99)

## 2020-06-11 LAB — PROCALCITONIN: Procalcitonin: <0.1 ng/mL

## 2020-06-11 LAB — MRSA PCR SCREENING: MRSA by PCR: NEGATIVE

## 2020-06-11 MED ORDER — ROCURONIUM BROMIDE 50 MG/5ML IV SOLN
INTRAVENOUS | Status: AC
  Administered 2020-06-11: 100 mg via INTRAVENOUS
  Filled 2020-06-11: qty 1

## 2020-06-11 MED ORDER — PANTOPRAZOLE SODIUM 40 MG IV SOLR
40.0000 mg | Freq: Every day | INTRAVENOUS | Status: DC
  Administered 2020-06-11 – 2020-06-13 (×3): 40 mg via INTRAVENOUS
  Filled 2020-06-11 (×3): qty 40

## 2020-06-11 MED ORDER — QUETIAPINE FUMARATE 200 MG PO TABS
600.0000 mg | ORAL_TABLET | Freq: Every day | ORAL | Status: DC
  Administered 2020-06-12: 600 mg

## 2020-06-11 MED ORDER — TRAZODONE HCL 100 MG PO TABS
100.0000 mg | ORAL_TABLET | Freq: Every day | ORAL | Status: DC
  Administered 2020-06-12: 100 mg
  Filled 2020-06-11: qty 1

## 2020-06-11 MED ORDER — DONEPEZIL HCL 5 MG PO TABS
5.0000 mg | ORAL_TABLET | Freq: Every day | ORAL | Status: DC
  Administered 2020-06-12 – 2020-06-15 (×4): 5 mg
  Filled 2020-06-11 (×5): qty 1

## 2020-06-11 MED ORDER — IPRATROPIUM-ALBUTEROL 0.5-2.5 (3) MG/3ML IN SOLN
3.0000 mL | Freq: Four times a day (QID) | RESPIRATORY_TRACT | Status: DC
  Administered 2020-06-11 – 2020-06-13 (×7): 3 mL via RESPIRATORY_TRACT
  Filled 2020-06-11 (×6): qty 3

## 2020-06-11 MED ORDER — IOHEXOL 350 MG/ML SOLN
75.0000 mL | Freq: Once | INTRAVENOUS | Status: AC | PRN
  Administered 2020-06-11: 75 mL via INTRAVENOUS

## 2020-06-11 MED ORDER — ASPIRIN 81 MG PO CHEW
81.0000 mg | CHEWABLE_TABLET | Freq: Every day | ORAL | Status: DC
  Administered 2020-06-12 – 2020-06-16 (×5): 81 mg
  Filled 2020-06-11 (×5): qty 1

## 2020-06-11 MED ORDER — VASOPRESSIN 20 UNITS/100 ML INFUSION FOR SHOCK
0.0000 [IU]/min | INTRAVENOUS | Status: DC
  Administered 2020-06-11 (×3): 0.04 [IU]/min via INTRAVENOUS
  Filled 2020-06-11 (×3): qty 100

## 2020-06-11 MED ORDER — MIDAZOLAM HCL 2 MG/2ML IJ SOLN
1.0000 mg | INTRAMUSCULAR | Status: DC | PRN
  Administered 2020-06-13: 1 mg via INTRAVENOUS
  Filled 2020-06-11 (×2): qty 2

## 2020-06-11 MED ORDER — FENTANYL CITRATE (PF) 100 MCG/2ML IJ SOLN
INTRAMUSCULAR | Status: AC
  Administered 2020-06-11: 100 ug via INTRAVENOUS
  Filled 2020-06-11: qty 2

## 2020-06-11 MED ORDER — POLYETHYLENE GLYCOL 3350 17 G PO PACK
17.0000 g | PACK | Freq: Every day | ORAL | Status: DC
  Administered 2020-06-11 – 2020-06-16 (×6): 17 g
  Filled 2020-06-11 (×6): qty 1

## 2020-06-11 MED ORDER — ROCURONIUM BROMIDE 50 MG/5ML IV SOLN: 100.0000 mg | Freq: Once | INTRAVENOUS | Status: AC

## 2020-06-11 MED ORDER — TRAZODONE HCL 50 MG PO TABS: 25.0000 mg | ORAL_TABLET | Freq: Every evening | ORAL | Status: DC | PRN

## 2020-06-11 MED ORDER — FENTANYL CITRATE (PF) 100 MCG/2ML IJ SOLN: 100.0000 ug | Freq: Once | INTRAMUSCULAR | Status: AC

## 2020-06-11 MED ORDER — FENTANYL 2500MCG IN NS 250ML (10MCG/ML) PREMIX INFUSION
25.0000 ug/h | INTRAVENOUS | Status: DC
  Administered 2020-06-11: 25 ug/h via INTRAVENOUS
  Administered 2020-06-11: 75 ug/h via INTRAVENOUS
  Administered 2020-06-12: 125 ug/h via INTRAVENOUS
  Administered 2020-06-12: 75 ug/h via INTRAVENOUS
  Administered 2020-06-13: 100 ug/h via INTRAVENOUS
  Administered 2020-06-15: 125 ug/h via INTRAVENOUS
  Administered 2020-06-16: 145 ug/h via INTRAVENOUS
  Administered 2020-06-17: 150 ug/h via INTRAVENOUS
  Filled 2020-06-11 (×8): qty 250

## 2020-06-11 MED ORDER — MIDAZOLAM HCL 2 MG/2ML IJ SOLN
INTRAMUSCULAR | Status: AC
  Administered 2020-06-11: 4 mg via INTRAVENOUS
  Filled 2020-06-11: qty 4

## 2020-06-11 MED ORDER — MIDAZOLAM HCL 2 MG/2ML IJ SOLN
2.0000 mg | Freq: Once | INTRAMUSCULAR | Status: AC
  Administered 2020-06-11: 2 mg via INTRAVENOUS

## 2020-06-11 MED ORDER — FENTANYL BOLUS VIA INFUSION
25.0000 ug | INTRAVENOUS | Status: DC | PRN
Start: 2020-06-11 — End: 2020-06-17
  Administered 2020-06-15: 25 ug via INTRAVENOUS
  Administered 2020-06-15: 100 ug via INTRAVENOUS
  Administered 2020-06-15: 25 ug via INTRAVENOUS
  Administered 2020-06-15: 50 ug via INTRAVENOUS
  Administered 2020-06-15: 25 ug via INTRAVENOUS
  Administered 2020-06-16: 100 ug via INTRAVENOUS
  Filled 2020-06-11: qty 100

## 2020-06-11 MED ORDER — CHLORHEXIDINE GLUCONATE 0.12% ORAL RINSE (MEDLINE KIT)
15.0000 mL | Freq: Two times a day (BID) | OROMUCOSAL | Status: DC
  Administered 2020-06-11 – 2020-06-13 (×6): 15 mL via OROMUCOSAL

## 2020-06-11 MED ORDER — DOXYCYCLINE HYCLATE 100 MG PO TABS
100.0000 mg | ORAL_TABLET | Freq: Two times a day (BID) | ORAL | Status: DC
  Administered 2020-06-11 – 2020-06-13 (×4): 100 mg
  Filled 2020-06-11 (×4): qty 1

## 2020-06-11 MED ORDER — MIDAZOLAM HCL 2 MG/2ML IJ SOLN: 1.0000 mg | INTRAMUSCULAR | Status: DC | PRN

## 2020-06-11 MED ORDER — NOREPINEPHRINE 4 MG/250ML-% IV SOLN: 0.0000 ug/min | INTRAVENOUS | Status: DC

## 2020-06-11 MED ORDER — MIDAZOLAM HCL 2 MG/2ML IJ SOLN: 4.0000 mg | Freq: Once | INTRAMUSCULAR | Status: AC

## 2020-06-11 MED ORDER — INSULIN ASPART 100 UNIT/ML IJ SOLN
0.0000 [IU] | INTRAMUSCULAR | Status: DC
  Administered 2020-06-11: 2 [IU] via SUBCUTANEOUS
  Administered 2020-06-11: 1 [IU] via SUBCUTANEOUS
  Administered 2020-06-11 (×3): 2 [IU] via SUBCUTANEOUS
  Administered 2020-06-12 – 2020-06-14 (×7): 1 [IU] via SUBCUTANEOUS
  Administered 2020-06-14: 2 [IU] via SUBCUTANEOUS
  Administered 2020-06-14 – 2020-06-15 (×3): 1 [IU] via SUBCUTANEOUS
  Administered 2020-06-15: 2 [IU] via SUBCUTANEOUS
  Administered 2020-06-15 – 2020-06-16 (×5): 1 [IU] via SUBCUTANEOUS
  Administered 2020-06-16: 2 [IU] via SUBCUTANEOUS
  Administered 2020-06-16: 1 [IU] via SUBCUTANEOUS
  Filled 2020-06-11 (×22): qty 1

## 2020-06-11 MED ORDER — ARIPIPRAZOLE 15 MG PO TABS
30.0000 mg | ORAL_TABLET | Freq: Every day | ORAL | Status: DC
  Administered 2020-06-11 – 2020-06-16 (×6): 30 mg
  Filled 2020-06-11 (×7): qty 2

## 2020-06-11 MED ORDER — NOREPINEPHRINE 4 MG/250ML-% IV SOLN
INTRAVENOUS | Status: AC
  Administered 2020-06-11: 4 mg
  Filled 2020-06-11: qty 250

## 2020-06-11 MED ORDER — FENTANYL CITRATE (PF) 100 MCG/2ML IJ SOLN: 25.0000 ug | Freq: Once | INTRAMUSCULAR | Status: DC

## 2020-06-11 MED ORDER — DOCUSATE SODIUM 50 MG/5ML PO LIQD
100.0000 mg | Freq: Two times a day (BID) | ORAL | Status: DC
  Administered 2020-06-11 – 2020-06-16 (×10): 100 mg
  Filled 2020-06-11 (×10): qty 10

## 2020-06-11 MED ORDER — MORPHINE SULFATE (CONCENTRATE) 10 MG/0.5ML PO SOLN: 5.0000 mg | ORAL | Status: DC | PRN

## 2020-06-11 MED ORDER — ETOMIDATE 2 MG/ML IV SOLN
20.0000 mg | Freq: Once | INTRAVENOUS | Status: AC
  Administered 2020-06-11: 20 mg via INTRAVENOUS
  Filled 2020-06-11: qty 10

## 2020-06-11 MED ORDER — ORAL CARE MOUTH RINSE
15.0000 mL | OROMUCOSAL | Status: DC
  Administered 2020-06-11 – 2020-06-13 (×23): 15 mL via OROMUCOSAL

## 2020-06-11 MED ORDER — IOHEXOL 9 MG/ML PO SOLN
500.0000 mL | ORAL | Status: AC
  Administered 2020-06-11 (×2): 500 mL via ORAL

## 2020-06-11 MED ORDER — DEXMEDETOMIDINE HCL IN NACL 400 MCG/100ML IV SOLN
0.0000 ug/kg/h | INTRAVENOUS | Status: AC
  Administered 2020-06-11: 0.774 ug/kg/h via INTRAVENOUS
  Administered 2020-06-11: 0.4 ug/kg/h via INTRAVENOUS
  Administered 2020-06-11: 0.801 ug/kg/h via INTRAVENOUS
  Administered 2020-06-12: 1.2 ug/kg/h via INTRAVENOUS
  Administered 2020-06-12: 0.9 ug/kg/h via INTRAVENOUS
  Administered 2020-06-12 – 2020-06-13 (×3): 0.8 ug/kg/h via INTRAVENOUS
  Administered 2020-06-13: 0.4 ug/kg/h via INTRAVENOUS
  Administered 2020-06-14 (×3): 0.8 ug/kg/h via INTRAVENOUS
  Administered 2020-06-15: 0.902 ug/kg/h via INTRAVENOUS
  Administered 2020-06-15: 1.2 ug/kg/h via INTRAVENOUS
  Administered 2020-06-15: 0.8 ug/kg/h via INTRAVENOUS
  Administered 2020-06-16 (×3): 1 ug/kg/h via INTRAVENOUS
  Filled 2020-06-11 (×19): qty 100

## 2020-06-11 MED ORDER — NOREPINEPHRINE 16 MG/250ML-% IV SOLN
0.0000 ug/min | INTRAVENOUS | Status: DC
  Administered 2020-06-11: 5.333 ug/min via INTRAVENOUS
  Administered 2020-06-11: 30 ug/min via INTRAVENOUS
  Filled 2020-06-11 (×2): qty 250

## 2020-06-11 NOTE — Progress Notes (Signed)
[name], male   DOB: [birth year], 70 y.o.   MRN: [id number]  Events overnight noted.  Critical care team has taken over care since patient was intubated.  Updated critical care team on hospital course up to this point.  Dr Loletha Grayer

## 2020-06-11 NOTE — Consult Note (Signed)
Subjective:   CC: Small bowel obstruction  HPI:  Carlos Nelson is a 70 y.o. male who was consulted by Lanney Gins for issue above.  Per report and chart review, patient was having some complaints of abdominal distention requiring some laxative use and was having bowel movements until couple days ago.  His respiratory status continued to worsen and had to be intubated.  Examination in the ICU noted a very distended stomach and when the OG tube was placed, over a liter of gastric output was noted.  Surgery consulted for possible bowel extraction with this finding  Patient currently sedated and vented and unable to provide any additional information.   Past Medical History:  has a past medical history of Chronic respiratory failure with hypoxia ([place]), COPD (chronic obstructive pulmonary disease) ([place]), Hypertension, and Schizophrenia ([place]).  Past Surgical History:  Past Surgical History:  Procedure Laterality Date  . COLONOSCOPY    . COLONOSCOPY WITH PROPOFOL N/A 12/13/2016   Procedure: COLONOSCOPY WITH PROPOFOL;  Surgeon: Lollie Sails, MD;  Location: [facility] ENDOSCOPY;  Service: Endoscopy;  Laterality: N/A;  . ESOPHAGOGASTRODUODENOSCOPY (EGD) WITH PROPOFOL N/A 12/13/2016   Procedure: ESOPHAGOGASTRODUODENOSCOPY (EGD) WITH PROPOFOL;  Surgeon: Lollie Sails, MD;  Location: [facility] ENDOSCOPY;  Service: Endoscopy;  Laterality: N/A;  . left arm surgery      Family History: family history is not on file.  Social History:  reports that he has been smoking cigarettes. He has been smoking about 1.00 pack per day. He has never used smokeless tobacco. He reports current alcohol use. He reports previous drug use. Drug: Marijuana.  Current Medications:  Prior to Admission medications   Medication Sig Start Date End Date Taking? Authorizing Provider  albuterol (VENTOLIN HFA) 108 (90 Base) MCG/ACT inhaler Inhale 2 by mouth every 4 hours as needed for wheezing, cough, and/or shortness of breath  05/23/20  Yes Rai, Ripudeep K, MD  amlodipine-olmesartan (AZOR) 10-20 MG tablet Take 1 tablet by mouth daily. 03/23/20  Yes Loel Dubonnet, NP  ARIPiprazole (ABILIFY) 30 MG tablet Take 30 mg by mouth daily. 01/29/20  Yes [provider]  aspirin EC 81 MG EC tablet Take 1 tablet (81 mg total) by mouth daily. Swallow whole. 05/24/20  Yes Rai, Ripudeep K, MD  dextromethorphan-guaiFENesin (MUCINEX DM) 30-600 MG 12hr tablet Take 1 tablet by mouth 2 (two) times daily as needed for cough. 05/23/20  Yes Rai, Ripudeep K, MD  donepezil (ARICEPT) 5 MG tablet Take 5 mg by mouth at bedtime.   Yes [provider]  mometasone-formoterol (DULERA) 100-5 MCG/ACT AERO Inhale 2 puffs into the lungs 2 (two) times daily. 05/23/20  Yes Rai, Ripudeep K, MD  pantoprazole (PROTONIX) 20 MG tablet Take 1 tablet (20 mg total) by mouth daily. 12/18/19 12/17/20 Yes Lavonia Drafts, MD  QUEtiapine (SEROQUEL) 200 MG tablet Take 3 tablets (600 mg total) by mouth at bedtime. 02/03/20  Yes Harvest Dark, MD  traZODone (DESYREL) 100 MG tablet Take 100 mg by mouth at bedtime. 02/24/20  Yes [provider]  ipratropium-albuterol (DUONEB) 0.5-2.5 (3) MG/3ML SOLN Take 3 mLs by nebulization every 4 (four) hours for 14 days. Patient taking differently: Take 3 mLs by nebulization every 4 (four) hours as needed. 03/11/20 03/25/20  Fritzi Mandes, MD  nitroGLYCERIN (NITROSTAT) 0.4 MG SL tablet Place 1 tablet (0.4 mg total) under the tongue every 5 (five) minutes as needed for chest pain. 05/23/20   Rai, Vernelle Emerald, MD  predniSONE (DELTASONE) 10 MG tablet Prednisone dosing: Take  Prednisone 40mg  (4 tabs) x 3 days, then taper to 30mg  (3 tabs) x 3 days, then 20mg  (2 tabs) x 3days, then 10mg  (1 tab) x 3days, then OFF. Patient not taking: Reported on 05/20/2020 05/23/20   Mendel Corning, MD    Allergies:  Allergies as of 05/20/2020 - Review Complete 06/13/2020  Allergen Reaction Noted  . Ace inhibitors Swelling 04/05/2018    ROS:   Not obtainable secondary to patient status    Objective:     BP 94/62   Pulse 75   Temp 99.5 F (37.5 C) (Oral)   Resp 20   Ht 5\' 4"  (1.626 m)   Wt 59.4 kg   SpO2 95%   BMI 22.49 kg/m   Constitutional :   Intubated and sedated  Lymphatics/Throat:  no asymmetry, masses, or scars  Respiratory:  clear to auscultation bilaterally  Cardiovascular:  regular rate and rhythm  Gastrointestinal: soft, non-tender; bowel sounds normal; no masses,  no organomegaly.   Musculoskeletal: Steady movement  Skin: Cool and moist   Psychiatric: Normal affect, non-agitated, not confused       LABS:  CMP Latest Ref Rng & Units 06/10/2020 06/09/2020 06/04/2020  Glucose 70 - 99 mg/dL 134(H) 145(H) 175(H)  BUN 8 - 23 mg/dL 22 19 17   Creatinine 0.61 - 1.24 mg/dL 0.98 0.82 1.04  Sodium 135 - 145 mmol/L 134(L) 136 139  Potassium 3.5 - 5.1 mmol/L 5.4(H) 4.8 4.7  Chloride 98 - 111 mmol/L 92(L) 94(L) 100  CO2 22 - 32 mmol/L 34(H) 33(H) 29  Calcium 8.9 - 10.3 mg/dL 9.2 9.9 9.5  Total Protein 6.5 - 8.1 g/dL 6.1(L) - -  Total Bilirubin 0.3 - 1.2 mg/dL 0.8 - -  Alkaline Phos 38 - 126 U/L 64 - -  AST 15 - 41 U/L 50(H) - -  ALT 0 - 44 U/L 50(H) - -   CBC Latest Ref Rng & Units 06/10/2020 06/09/2020 06/11/2020  WBC 4.0 - 10.5 K/uL 10.5 15.3(H) 7.8  Hemoglobin 13.0 - 17.0 g/dL 13.5 15.0 15.2  Hematocrit 39.0 - 52.0 % 42.5 46.2 47.0  Platelets 150 - 400 K/uL 141(L) 197 181    RADS: CLINICAL DATA:  Bowel obstruction suspected  EXAM: CT ABDOMEN AND PELVIS WITH CONTRAST  TECHNIQUE: Multidetector CT imaging of the abdomen and pelvis was performed using the standard protocol following bolus administration of intravenous contrast.  CONTRAST:  77mL OMNIPAQUE IOHEXOL 350 MG/ML SOLN, additional oral enteric contrast  COMPARISON:  None.  FINDINGS: Lower chest: No acute abnormality.  Hepatobiliary: No solid liver abnormality is seen. Numerous gallstones in the gallbladder. No gallbladder wall  thickening, or biliary dilatation.  Pancreas: Unremarkable. No pancreatic ductal dilatation or surrounding inflammatory changes.  Spleen: Normal in size without significant abnormality.  Adrenals/Urinary Tract: Adrenal glands are unremarkable. Kidneys are normal, without renal calculi, solid lesion, or hydronephrosis. Foley catheter in the urinary bladder. Thickening of the urinary bladder wall, likely secondary to chronic outlet obstruction.  Stomach/Bowel: Stomach is within normal limits. Appendix is not clearly visualized. Gas-filled, nondistended small bowel throughout the central abdomen. No evidence of bowel wall thickening, distention, or inflammatory changes. Large burden of stool in the right colon. Scattered stool present in the distal colon and rectum, with gas present to the rectum.  Vascular/Lymphatic: Aortic atherosclerosis. No enlarged abdominal or pelvic lymph nodes.  Reproductive: Prostatomegaly.  Other: No abdominal wall hernia or abnormality. No abdominopelvic ascites.  Musculoskeletal: No acute or significant osseous findings.  IMPRESSION: 1. No evidence  of bowel obstruction. 2. Large burden of stool in the right colon. Scattered stool present in the distal colon and rectum, with gas present to the rectum. 3. Prostatomegaly. Thickening of the urinary bladder wall, likely secondary to chronic outlet obstruction. Foley catheter in the urinary bladder. 4. Cholelithiasis.  Aortic Atherosclerosis (ICD10-I70.0).   Electronically Signed   By: Eddie Candle M.D.   On: 06/11/2020 13:09 Assessment:   Large gastric output secondary to OG tube placement for vented patient secondary to respiratory decompensation.  History and CT scan images which were personally reviewed by myself indicate more of a ileus type picture at this point.  Plan:    Due to his acute illness, ileus is likely more of a possibility at this point.  Recommend supportive care  with NG tube decompression for now, while he recovers from a respiratory standpoint.  Further ICU care per ICU team.  Surgery will continue to follow.

## 2020-06-11 NOTE — Plan of Care (Deleted)
Pt continue on fentanyl gtt, precedex gtt, Vaso gtt. Attempted to wean off levo but BP will drop. Pt very easily agitated with ant stimulation , bucking vent most times, Pt was weaned off levo gtt and propofol for now. VSS,  Live in girlfriend at bedside all day and updated.    Problem: Clinical Measurements: Goal: Ability to maintain clinical measurements within normal limits will improve Outcome: Not Progressing Goal: Will remain free from infection Outcome: Progressing Goal: Diagnostic test results will improve Outcome: Progressing Goal: Respiratory complications will improve Outcome: Not Progressing Goal: Cardiovascular complication will be avoided Outcome: Progressing

## 2020-06-11 NOTE — Progress Notes (Signed)
Peep decreased to 5 per NP

## 2020-06-11 NOTE — Progress Notes (Signed)
Received page from RN with concerns for decline in respiratory status. Pt tachycardiac, hypotensive and using accessory muscle. Chest ray unremarkable but ABG shows pt increasingly hypercarbic. PCCM consulted for intubation and  further management.  Acute hypoxic hypercarbic respiratory failure - Chest xray, ABG - PCCM consulted. Pt to be intubated and sedated.  Hypotension - CBC, CMP - Lactated ringers bolus  Lovey Newcomer, NP [facility] 7p-7a [phone]

## 2020-06-11 NOTE — Procedures (Signed)
Central Venous Catheter Insertion Procedure Note  TREVONTE ASHKAR  [id number]  07-27-1950  Date:06/11/20  Time:2:22 AM   Provider Performing:Darlene Brozowski L Rust-Chester   Procedure: Insertion of Non-tunneled Central Venous Catheter(36556) with US guidance (87183)   Indication(s) Medication administration  Consent Unable to obtain consent due to emergent nature of procedure.  Anesthesia Topical only with 1% lidocaine   Timeout Verified patient identification, verified procedure, site/side was marked, verified correct patient position, special equipment/implants available, medications/allergies/relevant history reviewed, required imaging and test results available.  Sterile Technique Maximal sterile technique including full sterile barrier drape, hand hygiene, sterile gown, sterile gloves, mask, hair covering, sterile ultrasound probe cover (if used).  Procedure Description Area of catheter insertion was cleaned with chlorhexidine and draped in sterile fashion.  With real-time ultrasound guidance a central venous catheter was placed into the left internal jugular vein. Nonpulsatile blood flow and easy flushing noted in all ports.  The catheter was sutured in place and sterile dressing applied.  Complications/Tolerance None; patient tolerated the procedure well. Chest X-ray is ordered to verify placement for internal jugular or subclavian cannulation.   Chest x-ray is not ordered for femoral cannulation.  EBL Minimal  Specimen(s) None   Venetia Night, AGACNP-BC Acute Care Nurse Practitioner [facility]   [phone] / [phone] Please see Amion for pager details.

## 2020-06-11 NOTE — Significant Event (Signed)
Pt had become tachycardic, more tachypneic and hypotensive.Nile Riggs NP notified and came to bedside. Decision made to intubate patient and pt now on ICU service. Pt intubated, central line and foley placed.

## 2020-06-11 NOTE — Care Plan (Signed)
Pt continued on full vent support all vent, unable to wean pt off levo gtt. Pt continued on versed, levo, fentanyl and precedex gtt. CT abdomen today and surgery consulted questionable ileus. OGT to [facility] WITH 850 CC of bile return in the last 12 hrs.  "Surgery came to bedside and stated to continue NGT to [facility] for few days and go from their".  Problem: Clinical Measurements: Goal: Ability to maintain clinical measurements within normal limits will improve Outcome: Not Progressing Goal: Will remain free from infection Outcome: Progressing Goal: Diagnostic test results will improve Outcome: Not Progressing

## 2020-06-11 NOTE — Consult Note (Signed)
NAME:  Carlos Nelson, MRN:  [id number], DOB:  05/17/1950, LOS: 4 ADMISSION DATE:  05/23/2020, CONSULTATION DATE:  06/11/20 REFERRING MD:  Rhunette Croft. Blount, NP, CHIEF COMPLAINT:   Shortness of Breath  History of Present Illness:  70 yo M who presented to [facility] ED via EMS from the group home where he resides with c/o shortness of breath that started the same day (06/04/2020) & chest tightness. EMS administered nebulizer treatments & solu-medrol IV, upon reaching the ED the patient reported his breathing felt better. ED course: Patient received 3 breathing treatments & Rocephin IV with SpO2 improvement from mid 80's to 91% at rest on room air. SpO2 dropped to 88% with ambulation and TRH was consulted to admit the patient with COPD exacerbation. Initial Vitals: afebrile, tachypneic at 32, tachycardic at 109, BP: 110/78 (89) & SpO2 at 98% on 4 L [place]. Significant Labs: CXR clear, troponin negative, COVID-19 & Influenza A/B negative, no other abnormalities noted at admission.  Hospital course: Patient admitted to Med-Surgical floor for COPD exacerbation. Patient receiving bronchodilator therapy, mucolytics, steroids & Doxycycline. On 06/10/20 patient feeling more short of breath, with audible wheezing. ABG: 7.35/ 65/ 102/ 35.9. Solu-medrol IV increased to Q 6 h & BIPAP ordered QHS. Rapid response called later on in the evening due to increased WOB and continued SOB. Patient placed on BIPAP, repeat ABG: 7.28/ 81/ 127/ 38.1. Patient able to speak in complete sentences stating his breathing "feels better". Transferred to SDU for monitoring. Patient deteriorated after 4 hours on BIPAP with increased accessory muscle use, lethargy, tachycardia & hypotension. PCCM consulted for emergent intubation.  Pertinent  Medical History  COPD- not on chronic O2 Schizophrenia Dementia HTN Current Smoker - 8 cigarettes daily Significant Hospital Events: Including procedures, antibiotic start and stop dates in addition to other  pertinent events   . 05/18/2020- Admit to Medical surgical floor by [facility] for COPD exacerbation . 06/10/20- Deterioration requiring BIPAP and transfer to SDU, ultimately failing BIPAP requiring emergent intubation & mechanical ventilation.  Interim History / Subjective:  Patient lethargic, unable to respond to questions (change from earlier this evening) with accessory muscle use, audible wheezing bilaterally, tachycardia & tachypnea. Decision made to emergently intubate. Jeannette Corpus, NP called sister and left a VM.  Objective   Blood pressure 93/72, pulse (!) 128, temperature 98.3 F (36.8 C), resp. rate (!) 28, height 5\' 4"  (1.626 m), weight 59.4 kg, SpO2 97 %.        Intake/Output Summary (Last 24 hours) at 06/11/2020 0012 Last data filed at 06/10/2020 1700 Gross per 24 hour  Intake 360 ml  Output 1300 ml  Net -940 ml   Filed Weights   06/08/2020 0028  Weight: 59.4 kg    Examination: General: Adult male, critically ill, lying in bed, dyspneic with accessory muscle use requiring intubation HEENT: MM pink/moist, anicteric, atraumatic, neck supple Neuro: Lethargic, unable to follow commands, PERRL +3, MAE CV: s1s2 RRR, ST on monitor, no r/m/g Pulm: Regular, Labored on BIPAP @ 50% fio2, breath sounds expiratory wheezes/diminished-BUL & diminished-BLL GI: soft, rounded, non tender, bs x 4 Skin:  no rashes/lesions noted Extremities: warm/dry, pulses + 2 R/P, no edema noted  Labs/imaging that I have personally reviewed  (right click and "Reselect all SmartList Selections" daily)  Net: +500 mL (- 3.8 L since admit) Na+/ K+: 134/ 5.4 BUN/Cr.: 22/ 0.98 Serum CO2/ AG: 34/ 8  Hgb: 13.5 WBC/ TMAX: 10.5/ 37.2  ABG: 7.28/ 81/ 127/ 38.1 CXR 06/10/20 :  Emphysema  without acute abnormality Resolved Hospital Problem list     Assessment & Plan:  Acute Hypoxic / Hypercapnic Respiratory Failure in the setting of COPD exacerbation  PMHx: COPD (no chronic O2), current smoker - Ventilator  settings: PRVC  8 mL/kg, 50% FiO2, 8 PEEP, continue ventilator support & lung protective strategies - Wean PEEP & FiO2 as tolerated, maintain SpO2 > 90% - Head of bed elevated 30 degrees, VAP protocol in place - Plateau pressures less than 30 cm H20  - Intermittent chest x-ray & ABG PRN - Daily WUA with SBT as tolerated  - Ensure adequate pulmonary hygiene  - F/u cultures, trend PCT - Continue COPD coverage: doxycycline > extended course from 5 to 7 days (would have ended 06/11/20) - Steroids continued: solu-medrol 40 mg Q 6 > consider tapering as patient stabilizes - Budesonide nebs BID, bronchodilators PRN - PAD protocol in place: continue Fentanyl drip & Precedex drip - Initiate levophed drip to maintain MAP > 65  Schizophrenia Dementia - continue home regimen: Aricept, trazodone, Abilify & Seroquel  Hypertension - irbesartan & amlodipine discontinued due to hypotension, consider restarting as patient stabilizes  Hyperglycemia in the setting of steroid use - CBG Q 4 monitoring with SSI coverage - follow ICU hypo/ hyper-glycemia protocol  Best practice (right click and "Reselect all SmartList Selections" daily)  Diet:  NPO Pain/Anxiety/Delirium protocol (if indicated): Yes (RASS goal -1) VAP protocol (if indicated): Yes DVT prophylaxis: LMWH GI prophylaxis: PPI Glucose control:  SSI Yes Central venous access:  Will be placing due to vasopressor needs Arterial line:  N/A Foley:  Will be placing due to retention Mobility:  bed rest  PT consulted: N/A Last date of multidisciplinary goals of care discussion 06/10/20 Code Status:  full code Disposition: ICU  Labs   CBC: Recent Labs  Lab 06/04/20 1319 05/19/2020 0036 06/05/2020 0621 06/09/20 1741  WBC 14.6* 9.9 7.8 15.3*  NEUTROABS 12.9* 7.6  --   --   HGB 14.8 13.7 15.2 15.0  HCT 45.4 42.4 47.0 46.2  MCV 91.3 91.8 92.2 93.0  PLT 226 200 181 828    Basic Metabolic Panel: Recent Labs  Lab 06/04/20 1509 06/06/2020 0036  05/25/2020 0621 06/09/20 1741  NA 140 139 139 136  K 4.4 4.4 4.7 4.8  CL 101 102 100 94*  CO2 31 28 29  33*  GLUCOSE 121* 126* 175* 145*  BUN 10 18 17 19   CREATININE 0.72 1.22 1.04 0.82  CALCIUM 9.6 9.0 9.5 9.9   GFR: Estimated Creatinine Clearance: 69.2 mL/min (by C-G formula based on SCr of 0.82 mg/dL). Recent Labs  Lab 06/04/20 1319 06/11/2020 0036 06/05/2020 0621 06/09/20 1741  WBC 14.6* 9.9 7.8 15.3*    Liver Function Tests: No results for input(s): AST, ALT, ALKPHOS, BILITOT, PROT, ALBUMIN in the last 168 hours. No results for input(s): LIPASE, AMYLASE in the last 168 hours. No results for input(s): AMMONIA in the last 168 hours.  ABG    Component Value Date/Time   PHART 7.28 (L) 06/10/2020 2027   PCO2ART PENDING 06/10/2020 2027   PO2ART 127 (H) 06/10/2020 2027   HCO3 38.1 (H) 06/10/2020 2027   O2SAT 98.5 06/10/2020 2027     Coagulation Profile: No results for input(s): INR, PROTIME in the last 168 hours.  Cardiac Enzymes: No results for input(s): CKTOTAL, CKMB, CKMBINDEX, TROPONINI in the last 168 hours.  HbA1C: Hemoglobin A1C  Date/Time Value Ref Range Status  11/17/2013 06:18 AM 5.5 4.2 - 6.3 % Final  Comment:    The American Diabetes Association recommends that a primary goal of therapy should be <7% and that physicians should reevaluate the treatment regimen in patients with HbA1c values consistently >8%.    Hgb A1c MFr Bld  Date/Time Value Ref Range Status  05/22/2020 04:10 AM 5.9 (H) 4.8 - 5.6 % Final    Comment:    (NOTE) Pre diabetes:          5.7%-6.4%  Diabetes:              >6.4%  Glycemic control for   <7.0% adults with diabetes   01/19/2020 05:07 AM 5.2 4.8 - 5.6 % Final    Comment:    (NOTE) Pre diabetes:          5.7%-6.4%  Diabetes:              >6.4%  Glycemic control for   <7.0% adults with diabetes     CBG: Recent Labs  Lab 06/09/20 2125 06/10/20 0741 06/10/20 1135 06/10/20 1647 06/10/20 2021  GLUCAP 120* 114*  122* 124* 142*    Review of Systems:   Patient in respiratory distress, lethargic and unable to participate in questions/care at this time.  Past Medical History:  He,  has a past medical history of Chronic respiratory failure with hypoxia ([place]), COPD (chronic obstructive pulmonary disease) ([place]), Hypertension, and Schizophrenia ([place]).   Surgical History:   Past Surgical History:  Procedure Laterality Date  . COLONOSCOPY    . COLONOSCOPY WITH PROPOFOL N/A 12/13/2016   Procedure: COLONOSCOPY WITH PROPOFOL;  Surgeon: Lollie Sails, MD;  Location: [facility] ENDOSCOPY;  Service: Endoscopy;  Laterality: N/A;  . ESOPHAGOGASTRODUODENOSCOPY (EGD) WITH PROPOFOL N/A 12/13/2016   Procedure: ESOPHAGOGASTRODUODENOSCOPY (EGD) WITH PROPOFOL;  Surgeon: Lollie Sails, MD;  Location: [facility] ENDOSCOPY;  Service: Endoscopy;  Laterality: N/A;  . left arm surgery       Social History:   reports that he has been smoking cigarettes. He has been smoking about 1.00 pack per day. He has never used smokeless tobacco. He reports current alcohol use. He reports previous drug use. Drug: Marijuana.   Family History:  His family history is negative for Prostate cancer and Bladder Cancer.   Allergies Allergies  Allergen Reactions  . Ace Inhibitors Swelling    Ok with benazapril, per grouphome     Home Medications  Prior to Admission medications   Medication Sig Start Date End Date Taking? Authorizing Provider  albuterol (VENTOLIN HFA) 108 (90 Base) MCG/ACT inhaler Inhale 2 by mouth every 4 hours as needed for wheezing, cough, and/or shortness of breath 05/23/20  Yes Rai, Ripudeep K, MD  amlodipine-olmesartan (AZOR) 10-20 MG tablet Take 1 tablet by mouth daily. 03/23/20  Yes Loel Dubonnet, NP  ARIPiprazole (ABILIFY) 30 MG tablet Take 30 mg by mouth daily. 01/29/20  Yes [provider]  aspirin EC 81 MG EC tablet Take 1 tablet (81 mg total) by mouth daily. Swallow whole. 05/24/20  Yes Rai, Ripudeep K,  MD  dextromethorphan-guaiFENesin (MUCINEX DM) 30-600 MG 12hr tablet Take 1 tablet by mouth 2 (two) times daily as needed for cough. 05/23/20  Yes Rai, Ripudeep K, MD  donepezil (ARICEPT) 5 MG tablet Take 5 mg by mouth at bedtime.   Yes [provider]  mometasone-formoterol (DULERA) 100-5 MCG/ACT AERO Inhale 2 puffs into the lungs 2 (two) times daily. 05/23/20  Yes Rai, Ripudeep K, MD  pantoprazole (PROTONIX) 20 MG tablet Take 1 tablet (20 mg total)  by mouth daily. 12/18/19 12/17/20 Yes Lavonia Drafts, MD  QUEtiapine (SEROQUEL) 200 MG tablet Take 3 tablets (600 mg total) by mouth at bedtime. 02/03/20  Yes Harvest Dark, MD  traZODone (DESYREL) 100 MG tablet Take 100 mg by mouth at bedtime. 02/24/20  Yes [provider]  ipratropium-albuterol (DUONEB) 0.5-2.5 (3) MG/3ML SOLN Take 3 mLs by nebulization every 4 (four) hours for 14 days. Patient taking differently: Take 3 mLs by nebulization every 4 (four) hours as needed. 03/11/20 03/25/20  Fritzi Mandes, MD  nitroGLYCERIN (NITROSTAT) 0.4 MG SL tablet Place 1 tablet (0.4 mg total) under the tongue every 5 (five) minutes as needed for chest pain. 05/23/20   Rai, Vernelle Emerald, MD  predniSONE (DELTASONE) 10 MG tablet Prednisone dosing: Take  Prednisone 40mg  (4 tabs) x 3 days, then taper to 30mg  (3 tabs) x 3 days, then 20mg  (2 tabs) x 3days, then 10mg  (1 tab) x 3days, then OFF. Patient not taking: Reported on 05/21/2020 05/23/20   Mendel Corning, MD     Critical care time: 55 minutes       Venetia Night, AGACNP-BC Acute Care Nurse Practitioner [facility]   [phone] / [phone] Please see Amion for pager details.

## 2020-06-11 NOTE — Progress Notes (Signed)
eLink Physician-Brief Progress Note Patient Name: SHO SALGUERO DOB: 08-04-1950 MRN: [id number]   Date of Service  06/11/2020  HPI/Events of Note  Patient admitted to the medical floor secondary to acute exacerbation of COPD, then transferred to the ICU and intubated secondary to acute on chronic hypercapnic respiratory failure, CXR not consistent with pneumonia.  eICU Interventions  New Patient Evaluation.        Kerry Kass Kingsley Farace 06/11/2020, 1:37 AM

## 2020-06-11 NOTE — Procedures (Signed)
Intubation Procedure Note  Carlos Nelson  [id number]  [birth year]  Date:06/11/20  Time:1:20 AM   Provider Performing:[place] Every L Rust-Chester    Procedure: Intubation (37543)  Indication(s) Respiratory Failure  Consent Unable to obtain consent due to emergent nature of procedure.   Anesthesia Etomidate, Versed, Fentanyl and Rocuronium   Time Out Verified patient identification, verified procedure, site/side was marked, verified correct patient position, special equipment/implants available, medications/allergies/relevant history reviewed, required imaging and test results available.   Sterile Technique Usual hand hygeine, masks, and gloves were used   Procedure Description Patient positioned in bed supine.  Sedation given as noted above.  Patient was intubated with endotracheal tube using Glidescope.  View was Grade 1 full glottis .  Number of attempts was 1.  Colorimetric CO2 detector was consistent with tracheal placement.   Complications/Tolerance None; patient tolerated the procedure well. Chest X-ray is ordered to verify placement.   EBL Minimal   Specimen(s) None  Jeannette Corpus, NP called the patient's sister, Carlos Nelson, but there was no answer- she left a message.  Domingo Pulse Rust-Chester, AGACNP-BC Acute Care Nurse Practitioner [facility]   [phone] / [phone] Please see Amion for pager details.

## 2020-06-12 LAB — CBC
HCT: 39 % (ref 39.0–52.0)
Hemoglobin: 12.3 g/dL — ABNORMAL LOW (ref 13.0–17.0)
MCH: 29.4 pg (ref 26.0–34.0)
MCHC: 31.5 g/dL (ref 30.0–36.0)
MCV: 93.3 fL (ref 80.0–100.0)
Platelets: 161 10*3/uL (ref 150–400)
RBC: 4.18 MIL/uL — ABNORMAL LOW (ref 4.22–5.81)
RDW: 13.2 % (ref 11.5–15.5)
WBC: 13.6 10*3/uL — ABNORMAL HIGH (ref 4.0–10.5)
nRBC: 0 % (ref 0.0–0.2)

## 2020-06-12 LAB — MAGNESIUM: Magnesium: 2.5 mg/dL — ABNORMAL HIGH (ref 1.7–2.4)

## 2020-06-12 LAB — COMPREHENSIVE METABOLIC PANEL
ALT: 39 U/L (ref 0–44)
AST: 27 U/L (ref 15–41)
Albumin: 3.3 g/dL — ABNORMAL LOW (ref 3.5–5.0)
Alkaline Phosphatase: 53 U/L (ref 38–126)
Anion gap: 7 (ref 5–15)
BUN: 52 mg/dL — ABNORMAL HIGH (ref 8–23)
CO2: 34 mmol/L — ABNORMAL HIGH (ref 22–32)
Calcium: 9.1 mg/dL (ref 8.9–10.3)
Chloride: 93 mmol/L — ABNORMAL LOW (ref 98–111)
Creatinine, Ser: 1.25 mg/dL — ABNORMAL HIGH (ref 0.61–1.24)
GFR, Estimated: >60 mL/min (ref >60)
Glucose, Bld: 117 mg/dL — ABNORMAL HIGH (ref 70–99)
Potassium: 5.2 mmol/L — ABNORMAL HIGH (ref 3.5–5.1)
Sodium: 134 mmol/L — ABNORMAL LOW (ref 135–145)
Total Bilirubin: 0.7 mg/dL (ref 0.3–1.2)
Total Protein: 5.8 g/dL — ABNORMAL LOW (ref 6.5–8.1)

## 2020-06-12 LAB — PROCALCITONIN: Procalcitonin: 0.2 ng/mL

## 2020-06-12 LAB — GLUCOSE, CAPILLARY
Glucose-Capillary: 106 mg/dL — ABNORMAL HIGH (ref 70–99)
Glucose-Capillary: 109 mg/dL — ABNORMAL HIGH (ref 70–99)
Glucose-Capillary: 116 mg/dL — ABNORMAL HIGH (ref 70–99)
Glucose-Capillary: 123 mg/dL — ABNORMAL HIGH (ref 70–99)
Glucose-Capillary: 126 mg/dL — ABNORMAL HIGH (ref 70–99)
Glucose-Capillary: 138 mg/dL — ABNORMAL HIGH (ref 70–99)

## 2020-06-12 LAB — PHOSPHORUS: Phosphorus: 4.5 mg/dL (ref 2.5–4.6)

## 2020-06-12 MED ORDER — METHYLPREDNISOLONE SODIUM SUCC 40 MG IJ SOLR
40.0000 mg | Freq: Two times a day (BID) | INTRAMUSCULAR | Status: DC
  Administered 2020-06-12 – 2020-06-15 (×6): 40 mg via INTRAVENOUS
  Filled 2020-06-12 (×6): qty 1

## 2020-06-12 NOTE — Progress Notes (Signed)
Subjective:  CC: Carlos Nelson is a 70 y.o. male  Hospital stay day 5,   ileus and respiratory failure  HPI: No acute issues overnight.  ROS:  Unable to obtain secondary to patient status  Objective:   Temp:  [99.2 F (37.3 C)-99.5 F (37.5 C)] 99.4 F (37.4 C) (05/29 0400) Pulse Rate:  [70-102] 80 (05/29 1100) Resp:  [20-27] 20 (05/29 1100) BP: (83-152)/(46-96) 91/59 (05/29 1100) SpO2:  [90 %-100 %] 99 % (05/29 1100) FiO2 (%):  [45 %-60 %] 45 % (05/29 0837)     Height: 5\' 4"  (162.6 cm) Weight: 59.4 kg BMI (Calculated): 22.47   Intake/Output this shift:   Intake/Output Summary (Last 24 hours) at 06/12/2020 1228 Last data filed at 06/12/2020 1122 Gross per 24 hour  Intake 913.04 ml  Output 1875 ml  Net -961.96 ml    Constitutional :  intubated and sedated  Respiratory:  clear to auscultation bilaterally  Cardiovascular:  regular rate and rhythm  Gastrointestinal: soft, non-tender; bowel sounds normal; no masses,  no organomegaly. NG with bilious output 927ml/24hr  Skin: Cool and moist.   Psychiatric: Normal affect, non-agitated, not confused       LABS:  CMP Latest Ref Rng & Units 06/12/2020 06/10/2020 06/09/2020  Glucose 70 - 99 mg/dL 117(H) 134(H) 145(H)  BUN 8 - 23 mg/dL 52(H) 22 19  Creatinine 0.61 - 1.24 mg/dL 1.25(H) 0.98 0.82  Sodium 135 - 145 mmol/L 134(L) 134(L) 136  Potassium 3.5 - 5.1 mmol/L 5.2(H) 5.4(H) 4.8  Chloride 98 - 111 mmol/L 93(L) 92(L) 94(L)  CO2 22 - 32 mmol/L 34(H) 34(H) 33(H)  Calcium 8.9 - 10.3 mg/dL 9.1 9.2 9.9  Total Protein 6.5 - 8.1 g/dL 5.8(L) 6.1(L) -  Total Bilirubin 0.3 - 1.2 mg/dL 0.7 0.8 -  Alkaline Phos 38 - 126 U/L 53 64 -  AST 15 - 41 U/L 27 50(H) -  ALT 0 - 44 U/L 39 50(H) -   CBC Latest Ref Rng & Units 06/12/2020 06/10/2020 06/09/2020  WBC 4.0 - 10.5 K/uL 13.6(H) 10.5 15.3(H)  Hemoglobin 13.0 - 17.0 g/dL 12.3(L) 13.5 15.0  Hematocrit 39.0 - 52.0 % 39.0 42.5 46.2  Platelets 150 - 400 K/uL 161 141(L) 197     RADS: N/A Assessment:   Ileus. No change overall.  Abdomen remains soft so no rush for any additional intervention.  Recommend continuing NG decompression and further critical care support per ICU team.  Surgery will continue to follow

## 2020-06-12 NOTE — Consult Note (Addendum)
NAME:  MOHANNAD OLIVERO, MRN:  [id number], DOB:  Jan 28, 1950, LOS: 5 ADMISSION DATE:  06/06/2020, CONSULTATION DATE:  06/11/20 REFERRING MD:  Rhunette Croft. Blount, NP, CHIEF COMPLAINT:   Shortness of Breath  History of Present Illness:  70 yo M who presented to [facility] ED via EMS from the group home where he resides with c/o shortness of breath that started the same day (06/10/2020) & chest tightness. EMS administered nebulizer treatments & solu-medrol IV, upon reaching the ED the patient reported his breathing felt better. ED course: Patient received 3 breathing treatments & Rocephin IV with SpO2 improvement from mid 80's to 91% at rest on room air. SpO2 dropped to 88% with ambulation and TRH was consulted to admit the patient with COPD exacerbation. Initial Vitals: afebrile, tachypneic at 32, tachycardic at 109, BP: 110/78 (89) & SpO2 at 98% on 4 L [place]. Significant Labs: CXR clear, troponin negative, COVID-19 & Influenza A/B negative, no other abnormalities noted at admission.  Hospital course: Patient admitted to Med-Surgical floor for COPD exacerbation. Patient receiving bronchodilator therapy, mucolytics, steroids & Doxycycline. On 06/10/20 patient feeling more short of breath, with audible wheezing. ABG: 7.35/ 65/ 102/ 35.9. Solu-medrol IV increased to Q 6 h & BIPAP ordered QHS. Rapid response called later on in the evening due to increased WOB and continued SOB. Patient placed on BIPAP, repeat ABG: 7.28/ 81/ 127/ 38.1. Patient able to speak in complete sentences stating his breathing "feels better". Transferred to SDU for monitoring. Patient deteriorated after 4 hours on BIPAP with increased accessory muscle use, lethargy, tachycardia & hypotension. PCCM consulted for emergent intubation.  Pertinent  Medical History  COPD- not on chronic O2 Schizophrenia Dementia HTN Current Smoker - 8 cigarettes daily Significant Hospital Events: Including procedures, antibiotic start and stop dates in addition to other  pertinent events   . 06/13/2020- Admit to Medical surgical floor by [facility] for COPD exacerbation . 06/10/20- Deterioration requiring BIPAP and transfer to SDU, ultimately failing BIPAP requiring emergent intubation & mechanical ventilation. . 06/12/20- patient is mildy improved.  He is off vasopressor and is getting weaned on ventilator in preparation for SBT.   Interim History / Subjective:  Patient lethargic, unable to respond to questions (change from earlier this evening) with accessory muscle use, audible wheezing bilaterally, tachycardia & tachypnea. Decision made to emergently intubate. Jeannette Corpus, NP called sister and left a VM.  Objective   Blood pressure 113/63, pulse 80, temperature 99.1 F (37.3 C), temperature source Oral, resp. rate 20, height 5\' 4"  (1.626 m), weight 59.4 kg, SpO2 93 %.    Vent Mode: PRVC FiO2 (%):  [45 %-60 %] 45 % Set Rate:  [20 bmp] 20 bmp Vt Set:  [450 mL] 450 mL PEEP:  [5 cmH20] 5 cmH20 Plateau Pressure:  [17 cmH20] 17 cmH20   Intake/Output Summary (Last 24 hours) at 06/12/2020 1623 Last data filed at 06/12/2020 1600 Gross per 24 hour  Intake 887.75 ml  Output 1900 ml  Net -1012.25 ml   Filed Weights   05/31/2020 0028  Weight: 59.4 kg    Examination: General: Adult male age appropriate s/p MV HEENT: MM pink/moist, anicteric, atraumatic, neck supple Neuro: GCS4T CV: s1s2 RRR, ST on monitor, no r/m/g Pulm: MV sounds without rhonchi GI: soft, rounded,distended Skin:  no rashes/lesions noted Extremities: warm/dry, pulses + 2 R/P, no edema noted   Resolved Hospital Problem list     Assessment & Plan:  Acute Hypoxic / Hypercapnic Respiratory Failure in the setting of COPD  exacerbation  PMHx: COPD (no chronic O2), current smoker - Ventilator settings: PRVC  8 mL/kg, 50% FiO2, 8 PEEP, continue ventilator support & lung protective strategies - Wean PEEP & FiO2 as tolerated, maintain SpO2 > 90% - Head of bed elevated 30 degrees, VAP protocol in place -  Plateau pressures less than 30 cm H20  - Intermittent chest x-ray & ABG PRN - Daily WUA with SBT as tolerated  - Ensure adequate pulmonary hygiene  - F/u cultures, trend PCT - Continue COPD coverage: doxycycline > extended course from 5 to 7 days (would have ended 06/11/20) - Steroids continued: solu-medrol 40 mg Q 6 > consider tapering as patient stabilizes - Budesonide nebs BID, bronchodilators PRN - PAD protocol in place: continue Fentanyl drip & Precedex drip - wean off levophed drip to maintain MAP > 65  Schizophrenia Dementia - continue home regimen: Aricept, trazodone, Abilify & Seroquel  Hypertension - irbesartan & amlodipine discontinued due to hypotension, consider restarting as patient stabilizes  Hyperglycemia in the setting of steroid use - CBG Q 4 monitoring with SSI coverage - follow ICU hypo/ hyper-glycemia protocol  Best practice (right click and "Reselect all SmartList Selections" daily)  Diet:  NPO Pain/Anxiety/Delirium protocol (if indicated): Yes (RASS goal -1) VAP protocol (if indicated): Yes DVT prophylaxis: LMWH GI prophylaxis: PPI Glucose control:  SSI Yes Central venous access:  Will be placing due to vasopressor needs Arterial line:  N/A Foley:  Will be placing due to retention Mobility:  bed rest  PT consulted: N/A Last date of multidisciplinary goals of care discussion 06/10/20 Code Status:  full code Disposition: ICU  Labs   CBC: Recent Labs  Lab 05/22/2020 0036 06/05/2020 0621 06/09/20 1741 06/10/20 2352 06/12/20 0418  WBC 9.9 7.8 15.3* 10.5 13.6*  NEUTROABS 7.6  --   --  9.8*  --   HGB 13.7 15.2 15.0 13.5 12.3*  HCT 42.4 47.0 46.2 42.5 39.0  MCV 91.8 92.2 93.0 94.2 93.3  PLT 200 181 197 141* 397    Basic Metabolic Panel: Recent Labs  Lab 05/31/2020 0036 06/03/2020 0621 06/09/20 1741 06/10/20 2352 06/12/20 0418  NA 139 139 136 134* 134*  K 4.4 4.7 4.8 5.4* 5.2*  CL 102 100 94* 92* 93*  CO2 28 29 33* 34* 34*  GLUCOSE 126* 175*  145* 134* 117*  BUN 18 17 19 22  52*  CREATININE 1.22 1.04 0.82 0.98 1.25*  CALCIUM 9.0 9.5 9.9 9.2 9.1  MG  --   --   --   --  2.5*  PHOS  --   --   --   --  4.5   GFR: Estimated Creatinine Clearance: 45.4 mL/min (A) (by C-G formula based on SCr of 1.25 mg/dL (H)). Recent Labs  Lab 05/29/2020 0621 06/09/20 1741 06/10/20 2352 06/12/20 0418  PROCALCITON  --   --  <0.10 0.20  WBC 7.8 15.3* 10.5 13.6*    Liver Function Tests: Recent Labs  Lab 06/10/20 2352 06/12/20 0418  AST 50* 27  ALT 50* 39  ALKPHOS 64 53  BILITOT 0.8 0.7  PROT 6.1* 5.8*  ALBUMIN 3.6 3.3*   No results for input(s): LIPASE, AMYLASE in the last 168 hours. No results for input(s): AMMONIA in the last 168 hours.  ABG    Component Value Date/Time   PHART 7.32 (L) 06/11/2020 0010   PCO2ART 68 (HH) 06/11/2020 0010   PO2ART 93 06/11/2020 0010   HCO3 35.0 (H) 06/11/2020 0010   O2SAT 96.6 06/11/2020  0010     Coagulation Profile: No results for input(s): INR, PROTIME in the last 168 hours.  Cardiac Enzymes: No results for input(s): CKTOTAL, CKMB, CKMBINDEX, TROPONINI in the last 168 hours.  HbA1C: Hemoglobin A1C  Date/Time Value Ref Range Status  11/17/2013 06:18 AM 5.5 4.2 - 6.3 % Final    Comment:    The American Diabetes Association recommends that a primary goal of therapy should be <7% and that physicians should reevaluate the treatment regimen in patients with HbA1c values consistently >8%.    Hgb A1c MFr Bld  Date/Time Value Ref Range Status  05/22/2020 04:10 AM 5.9 (H) 4.8 - 5.6 % Final    Comment:    (NOTE) Pre diabetes:          5.7%-6.4%  Diabetes:              >6.4%  Glycemic control for   <7.0% adults with diabetes   01/19/2020 05:07 AM 5.2 4.8 - 5.6 % Final    Comment:    (NOTE) Pre diabetes:          5.7%-6.4%  Diabetes:              >6.4%  Glycemic control for   <7.0% adults with diabetes     CBG: Recent Labs  Lab 06/11/20 2319 06/12/20 0353 06/12/20 0715  06/12/20 1127 06/12/20 1540  GLUCAP 145* 116* 126* 138* 109*    Review of Systems:   Patient in respiratory distress, lethargic and unable to participate in questions/care at this time.  Past Medical History:  He,  has a past medical history of Chronic respiratory failure with hypoxia ([place]), COPD (chronic obstructive pulmonary disease) ([place]), Hypertension, and Schizophrenia ([place]).   Surgical History:   Past Surgical History:  Procedure Laterality Date  . COLONOSCOPY    . COLONOSCOPY WITH PROPOFOL N/A 12/13/2016   Procedure: COLONOSCOPY WITH PROPOFOL;  Surgeon: Lollie Sails, MD;  Location: [facility] ENDOSCOPY;  Service: Endoscopy;  Laterality: N/A;  . ESOPHAGOGASTRODUODENOSCOPY (EGD) WITH PROPOFOL N/A 12/13/2016   Procedure: ESOPHAGOGASTRODUODENOSCOPY (EGD) WITH PROPOFOL;  Surgeon: Lollie Sails, MD;  Location: [facility] ENDOSCOPY;  Service: Endoscopy;  Laterality: N/A;  . left arm surgery       Social History:   reports that he has been smoking cigarettes. He has been smoking about 1.00 pack per day. He has never used smokeless tobacco. He reports current alcohol use. He reports previous drug use. Drug: Marijuana.   Family History:  His family history is negative for Prostate cancer and Bladder Cancer.   Allergies Allergies  Allergen Reactions  . Ace Inhibitors Swelling    Ok with benazapril, per grouphome     Home Medications  Prior to Admission medications   Medication Sig Start Date End Date Taking? Authorizing Provider  albuterol (VENTOLIN HFA) 108 (90 Base) MCG/ACT inhaler Inhale 2 by mouth every 4 hours as needed for wheezing, cough, and/or shortness of breath 05/23/20  Yes Rai, Ripudeep K, MD  amlodipine-olmesartan (AZOR) 10-20 MG tablet Take 1 tablet by mouth daily. 03/23/20  Yes Loel Dubonnet, NP  ARIPiprazole (ABILIFY) 30 MG tablet Take 30 mg by mouth daily. 01/29/20  Yes [provider]  aspirin EC 81 MG EC tablet Take 1 tablet (81 mg total) by mouth  daily. Swallow whole. 05/24/20  Yes Rai, Ripudeep K, MD  dextromethorphan-guaiFENesin (MUCINEX DM) 30-600 MG 12hr tablet Take 1 tablet by mouth 2 (two) times daily as needed for cough. 05/23/20  Yes Rai,  Ripudeep K, MD  donepezil (ARICEPT) 5 MG tablet Take 5 mg by mouth at bedtime.   Yes [provider]  mometasone-formoterol (DULERA) 100-5 MCG/ACT AERO Inhale 2 puffs into the lungs 2 (two) times daily. 05/23/20  Yes Rai, Ripudeep K, MD  pantoprazole (PROTONIX) 20 MG tablet Take 1 tablet (20 mg total) by mouth daily. 12/18/19 12/17/20 Yes Lavonia Drafts, MD  QUEtiapine (SEROQUEL) 200 MG tablet Take 3 tablets (600 mg total) by mouth at bedtime. 02/03/20  Yes Harvest Dark, MD  traZODone (DESYREL) 100 MG tablet Take 100 mg by mouth at bedtime. 02/24/20  Yes [provider]  ipratropium-albuterol (DUONEB) 0.5-2.5 (3) MG/3ML SOLN Take 3 mLs by nebulization every 4 (four) hours for 14 days. Patient taking differently: Take 3 mLs by nebulization every 4 (four) hours as needed. 03/11/20 03/25/20  Fritzi Mandes, MD  nitroGLYCERIN (NITROSTAT) 0.4 MG SL tablet Place 1 tablet (0.4 mg total) under the tongue every 5 (five) minutes as needed for chest pain. 05/23/20   Rai, Vernelle Emerald, MD  predniSONE (DELTASONE) 10 MG tablet Prednisone dosing: Take  Prednisone 40mg  (4 tabs) x 3 days, then taper to 30mg  (3 tabs) x 3 days, then 20mg  (2 tabs) x 3days, then 10mg  (1 tab) x 3days, then OFF. Patient not taking: Reported on 06/12/2020 05/23/20   Mendel Corning, MD     Critical care provider statement:    Critical care time (minutes):  33   Critical care time was exclusive of:  Separately billable procedures and  treating other patients   Critical care was necessary to treat or prevent imminent or  life-threatening deterioration of the following conditions:  acute hypoxemic respiratory failure due to severe COPD   Critical care was time spent personally by me on the following  activities:  Development of  treatment plan with patient or surrogate,  discussions with consultants, evaluation of patient's response to  treatment, examination of patient, obtaining history from patient or  surrogate, ordering and performing treatments and interventions, ordering  and review of laboratory studies and re-evaluation of patient's condition   I assumed direction of critical care for this patient from another  provider in my specialty: no           Ottie Glazier, M.D.  Pulmonary & [place]

## 2020-06-12 NOTE — Progress Notes (Addendum)
Pt continues to complain of sob, pt currently on 4L of O2, labored breathing noted with accessory muscle use, wheezing on auscultation, pt noted to be incontinent, urine on the floor. Pt cleaned, new gown and new sheets provided to pt. Dr. Leslye Peer notified. Per MD will order ABG, increase steoids, and roxanol. Bed alarm on and call bell within reach. Will continue to monitor closely.

## 2020-06-13 ENCOUNTER — Inpatient Hospital Stay: Payer: Medicare Other

## 2020-06-13 ENCOUNTER — Other Ambulatory Visit: Payer: Self-pay

## 2020-06-13 ENCOUNTER — Encounter: Payer: Self-pay | Admitting: Internal Medicine

## 2020-06-13 DIAGNOSIS — Z7189 Other specified counseling: Secondary | ICD-10-CM | POA: Diagnosis not present

## 2020-06-13 DIAGNOSIS — Z515 Encounter for palliative care: Secondary | ICD-10-CM

## 2020-06-13 LAB — BLOOD GAS, ARTERIAL
Acid-Base Excess: 20.1 mmol/L — ABNORMAL HIGH (ref 0.0–2.0)
Acid-Base Excess: 17.1 mmol/L — ABNORMAL HIGH (ref 0.0–2.0)
Bicarbonate: 49.1 mmol/L — ABNORMAL HIGH (ref 20.0–28.0)
Bicarbonate: 46 mmol/L — ABNORMAL HIGH (ref 20.0–28.0)
Expiratory PAP: 7
FIO2: 0.4
FIO2: 0.4
Inspiratory PAP: 14
MECHVT: 450 mL
Mechanical Rate: 20
O2 Saturation: 93.8 %
O2 Saturation: 92.8 %
PEEP: 5 cmH2O
Patient temperature: 37
Patient temperature: 37
RATE: 20 resp/min
pCO2 arterial: 74 mmHg (ref 32.0–48.0)
pCO2 arterial: 76 mmHg (ref 32.0–48.0)
pH, Arterial: 7.43 (ref 7.350–7.450)
pH, Arterial: 7.39 (ref 7.350–7.450)
pO2, Arterial: 68 mmHg — ABNORMAL LOW (ref 83.0–108.0)
pO2, Arterial: 67 mmHg — ABNORMAL LOW (ref 83.0–108.0)

## 2020-06-13 LAB — GLUCOSE, CAPILLARY
Glucose-Capillary: 110 mg/dL — ABNORMAL HIGH (ref 70–99)
Glucose-Capillary: 82 mg/dL (ref 70–99)
Glucose-Capillary: 110 mg/dL — ABNORMAL HIGH (ref 70–99)
Glucose-Capillary: 109 mg/dL — ABNORMAL HIGH (ref 70–99)
Glucose-Capillary: 134 mg/dL — ABNORMAL HIGH (ref 70–99)

## 2020-06-13 LAB — BASIC METABOLIC PANEL
Anion gap: 7 (ref 5–15)
BUN: 51 mg/dL — ABNORMAL HIGH (ref 8–23)
CO2: 36 mmol/L — ABNORMAL HIGH (ref 22–32)
Calcium: 9 mg/dL (ref 8.9–10.3)
Chloride: 96 mmol/L — ABNORMAL LOW (ref 98–111)
Creatinine, Ser: 0.9 mg/dL (ref 0.61–1.24)
GFR, Estimated: >60 mL/min (ref >60)
Glucose, Bld: 113 mg/dL — ABNORMAL HIGH (ref 70–99)
Potassium: 5.7 mmol/L — ABNORMAL HIGH (ref 3.5–5.1)
Sodium: 139 mmol/L (ref 135–145)

## 2020-06-13 LAB — PHOSPHORUS: Phosphorus: 3.6 mg/dL (ref 2.5–4.6)

## 2020-06-13 LAB — MAGNESIUM: Magnesium: 2.9 mg/dL — ABNORMAL HIGH (ref 1.7–2.4)

## 2020-06-13 LAB — PROCALCITONIN: Procalcitonin: 0.19 ng/mL

## 2020-06-13 LAB — POTASSIUM: Potassium: 5.4 mmol/L — ABNORMAL HIGH (ref 3.5–5.1)

## 2020-06-13 MED ORDER — SODIUM ZIRCONIUM CYCLOSILICATE 5 G PO PACK
10.0000 g | PACK | Freq: Once | ORAL | Status: AC
  Administered 2020-06-13: 10 g
  Filled 2020-06-13: qty 2

## 2020-06-13 MED ORDER — METOPROLOL TARTRATE 5 MG/5ML IV SOLN
2.5000 mg | Freq: Four times a day (QID) | INTRAVENOUS | Status: DC | PRN
Start: 2020-06-13 — End: 2020-06-17

## 2020-06-13 MED ORDER — NOREPINEPHRINE 16 MG/250ML-% IV SOLN
0.0000 ug/min | INTRAVENOUS | Status: DC
  Administered 2020-06-13: 7 ug/min via INTRAVENOUS

## 2020-06-13 MED ORDER — CHLORHEXIDINE GLUCONATE 0.12% ORAL RINSE (MEDLINE KIT)
15.0000 mL | Freq: Two times a day (BID) | OROMUCOSAL | Status: DC
  Administered 2020-06-13 – 2020-06-16 (×6): 15 mL via OROMUCOSAL

## 2020-06-13 MED ORDER — ROCURONIUM BROMIDE 50 MG/5ML IV SOLN
INTRAVENOUS | Status: AC
  Administered 2020-06-13: 10 mg
  Filled 2020-06-13: qty 1

## 2020-06-13 MED ORDER — LORAZEPAM 2 MG/ML IJ SOLN
INTRAMUSCULAR | Status: AC
  Administered 2020-06-13: 1 mg
  Filled 2020-06-13: qty 1

## 2020-06-13 MED ORDER — ORAL CARE MOUTH RINSE: 15.0000 mL | Freq: Two times a day (BID) | OROMUCOSAL | Status: DC

## 2020-06-13 MED ORDER — BUDESONIDE 0.25 MG/2ML IN SUSP
0.2500 mg | Freq: Two times a day (BID) | RESPIRATORY_TRACT | Status: DC
  Administered 2020-06-13 – 2020-06-16 (×6): 0.25 mg via RESPIRATORY_TRACT
  Filled 2020-06-13 (×6): qty 2

## 2020-06-13 MED ORDER — METOPROLOL TARTRATE 5 MG/5ML IV SOLN
INTRAVENOUS | Status: AC
  Administered 2020-06-13: 5 mg via INTRAVENOUS
  Filled 2020-06-13: qty 5

## 2020-06-13 MED ORDER — ORAL CARE MOUTH RINSE
15.0000 mL | OROMUCOSAL | Status: DC
  Administered 2020-06-13 – 2020-06-16 (×29): 15 mL via OROMUCOSAL

## 2020-06-13 NOTE — Care Plan (Signed)
  CRITICAL CARE  PROGRESS NOTE     Patient evaluated at bedside due to RN concern regarding worsening mentation with respiratory decline.    Patient found to be in respiratory distress.  He has been on BIPAP with initial improvement followed by progressive resp distress with accessory muscle use. He was able to respond previously but now is unresponsive. Patient with BIPAP failure.  ABG reviewed.  Goals of care unable to be clarified due to issues with next of kin.   Patient is at risk of cardiac arrest and death  Critical care provider statement:    Critical care time (minutes):  32   Critical care time was exclusive of:  Separately billable procedures and  treating other patients   Critical care was necessary to treat or prevent imminent or  life-threatening deterioration of the following conditions:  acute hypoxemic respiratory failure, advanced COPD   Critical care was time spent personally by me on the following  activities:  Development of treatment plan with patient or surrogate,  discussions with consultants, evaluation of patient's response to  treatment, examination of patient, obtaining history from patient or  surrogate, ordering and performing treatments and interventions, ordering  and review of laboratory studies and re-evaluation of patient's condition   I assumed direction of critical care for this patient from another  provider in my specialty: no      Ottie Glazier, M.D.  Pulmonary & [place]

## 2020-06-13 NOTE — Evaluation (Signed)
Occupational Therapy Evaluation Patient Details Name: Carlos Nelson MRN: [id number] DOB: Dec 05, 1950 Today's Date: 06/13/2020    History of Present Illness Pt is a 69 yo M who presented to [facility] ED via EMS from the group home where he resides with c/o shortness of breath that started the same day (05/17/2020) & chest tightness. Examination in the ICU noted a very distended stomach and when the OG tube was placed, over a liter of gastric output was noted.   Clinical Impression   Patient presenting with decreased I in self care, balance, functional mobility/transfers, endurance, and safety awareness. Per chart review, pt lives in a group home and is I in all aspects of self care and functional mobility. Pt extubated today and appears to be alert. Pt with flat affect but very impulsive with mobility this session. Pt on 66L HFNC 45%. Pt required min A of 2 to get to EOB. Pt's RR increased to 47 and BP results of 218/115 seated on EOB. Pt seated for ~10 minutes and then returning to bed. Pt fatigues very quickly this session. Patient will benefit from acute OT to increase overall independence in the areas of ADLs, functional mobility, and safety awareness in order to safely discharge to next venue of care.    Follow Up Recommendations  SNF    Equipment Recommendations  None recommended by OT       Precautions / Restrictions Precautions Precautions: Fall Restrictions Weight Bearing Restrictions: No      Mobility Bed Mobility Overal bed mobility: Needs Assistance Bed Mobility: Supine to Sit;Sit to Supine     Supine to sit: Min assist;HOB elevated;+2 for physical assistance;+2 for safety/equipment Sit to supine: Min assist;HOB elevated;+2 for physical assistance;+2 for safety/equipment        Transfers                      Balance Overall balance assessment: Needs assistance Sitting-balance support: Feet unsupported;Bilateral upper extremity supported Sitting balance-Leahy  Scale: Fair                                     ADL either performed or assessed with clinical judgement   ADL Overall ADL's : Needs assistance/impaired                                       General ADL Comments: Session limited secondary to vitals.     Vision Patient Visual Report: No change from baseline              Pertinent Vitals/Pain Pain Assessment: Faces Faces Pain Scale: No hurt     Hand Dominance     Extremity/Trunk Assessment Upper Extremity Assessment Upper Extremity Assessment: Generalized weakness   Lower Extremity Assessment Lower Extremity Assessment: Generalized weakness   Cervical / Trunk Assessment Cervical / Trunk Assessment: Kyphotic   Communication Communication Communication: Expressive difficulties   Cognition Arousal/Alertness: Awake/alert Behavior During Therapy: Flat affect;Impulsive Overall Cognitive Status: No family/caregiver present to determine baseline cognitive functioning                                 General Comments: Difficulty verbally communicating during session.              Home Living  Family/patient expects to be discharged to:: Group home                                 Additional Comments: Per chart, group home is single-story with 3 steps, R rail to enter      Prior Functioning/Environment Level of Independence: Independent        Comments: reports no falls recently; AMB around yard, neigborhood.  No home O2.  Responsible for taking out trash at facility.        OT Problem List: Decreased strength;Impaired balance (sitting and/or standing);Decreased cognition;Decreased knowledge of precautions;Decreased safety awareness;Decreased activity tolerance;Decreased coordination;Decreased knowledge of use of DME or AE;Cardiopulmonary status limiting activity      OT Treatment/Interventions: Self-care/ADL training;Manual  therapy;Modalities;Therapeutic exercise;Balance training;Energy conservation;Therapeutic activities;Cognitive remediation/compensation;DME and/or AE instruction;Patient/family education    OT Goals(Current goals can be found in the care plan section) Acute Rehab OT Goals Patient Stated Goal: none stated OT Goal Formulation: Patient unable to participate in goal setting Time For Goal Achievement: 06/27/20 Potential to Achieve Goals: Fair ADL Goals Pt Will Perform Grooming: with modified independence;standing Pt Will Perform Lower Body Dressing: with supervision;sit to/from stand Pt Will Transfer to Toilet: with supervision;ambulating Pt Will Perform Toileting - Clothing Manipulation and hygiene: with supervision;sit to/from stand  OT Frequency: Min 2X/week   Barriers to D/C:    none known at this time          AM-PAC OT "6 Clicks" Daily Activity     Outcome Measure Help from another person eating meals?: A Little Help from another person taking care of personal grooming?: A Little Help from another person toileting, which includes using toliet, bedpan, or urinal?: A Lot Help from another person bathing (including washing, rinsing, drying)?: A Lot Help from another person to put on and taking off regular upper body clothing?: A Little Help from another person to put on and taking off regular lower body clothing?: A Lot 6 Click Score: 15   End of Session Equipment Utilized During Treatment: Rolling walker Nurse Communication: Mobility status  Activity Tolerance: Patient tolerated treatment well Patient left: in bed;with call bell/phone within reach  OT Visit Diagnosis: Unsteadiness on feet (R26.81);Repeated falls (R29.6);Muscle weakness (generalized) (M62.81)                Time: 8338-2505 OT Time Calculation (min): 12 min Charges:  OT Evaluation $OT Eval Low Complexity: 1 Low   Delayna Sparlin Mathis Bud, MS, OTR/L , CBIS ascom [phone]  06/13/20, 4:18 PM   06/13/2020, 3:47 PM

## 2020-06-13 NOTE — Progress Notes (Signed)
SLP Cancellation Note  Patient Details Name: Carlos Nelson MRN: [id number] DOB: 04-29-1950   Cancelled treatment:       Reason Eval/Treat Not Completed: Patient not medically ready (chart reviewed). Per chart notes, pt was just extubated this morning. He appears to use accessory muscles during exertion(per PT note today). Will f/u tomorrow w/ BSE allowing pt time for improvement post extubation today. Recommend frequent oral care for hygiene and stimulation of swallowing; aspiration precautions.     Orinda Kenner, MS, CCC-SLP Speech Language Pathologist Rehab Services [phone] [facility] 06/13/2020, 2:53 PM

## 2020-06-13 NOTE — Procedures (Signed)
Endotracheal Intubation: Patient required placement of an artificial airway secondary to Respiratory Failure  Consent: Emergent.   During intubation I was assisted by Liborio Nixon RT  Hand washing performed prior to starting the procedure.   Medications administered for sedation prior to procedure:  Rocuronium 10 mg IV, Fentanyl 100 mcg IV.    A time out procedure was called and correct patient, name, & ID confirmed. Needed supplies and equipment were assembled and checked to include ETT, 10 ml syringe, Glidescope, Mac and Miller blades, suction, oxygen and bag mask valve, end tidal CO2 monitor.   Patient was positioned to align the mouth and pharynx to facilitate visualization of the glottis.   Heart rate, SpO2 and blood pressure was continuously monitored during the procedure. Pre-oxygenation was conducted prior to intubation and endotracheal tube was placed through the vocal cords into the trachea.     The artificial airway was placed under direct visualization via glidescope route using a 8.5 ETT on the first attempt.  ETT was secured at 24 cm mark.  Placement was confirmed by auscuitation of lungs with good breath sounds bilaterally and no stomach sounds.  Condensation was noted on endotracheal tube.   Pulse ox 98%.  CO2 detector in place with appropriate color change.   Complications: None .     Chest radiograph ordered and pending.   Comments: OGT placed via glidescope.   Ottie Glazier, M.D.  Pulmonary & [place]

## 2020-06-13 NOTE — Evaluation (Signed)
Physical Therapy Evaluation Patient Details Name: Carlos Nelson MRN: [id number] DOB: 02/06/1950 Today's Date: 06/13/2020   History of Present Illness  Pt is a 70 yo M who presented to [facility] ED via EMS from the group home where he resides with c/o shortness of breath that started the same day (05/20/2020) & chest tightness. Examination in the ICU noted a very distended stomach and when the OG tube was placed, over a liter of gastric output was noted.  Clinical Impression  Pt seen for PT evaluation with co-tx with OT. MD cleared pt for participation in session via secure chat. Pt unable to track therapist's hand with eyes, but able to locate both therapists in room. Pt impulsive during session, attempting to move to EOB then attempting to stand. PT/OT deferred standing 2/2 vitals but pt able to sit EOB ~1-2 minutes before therapists initiated return supine. Pt moves well in bed with min assist +2. Pt appears to use accessory muscles for breathing but no behaviors indicating pain during session. Pt would benefit from STR upon d/c to maximize independence with functional mobility & reduce fall risk prior to return home. Will continue to follow pt acutely to progress mobility as able.  Max HR during session 140 bpm Pt on 55L/min at 45% FiO2 via HFNC. SpO2 90% or greater during session Max RR of 47 bpm, in the low 30s at rest BP sitting EOB 218/115 mmHg in LUE (MAP 129), BP supine: 183/105 mmHg in LUE (MAP 125)     Follow Up Recommendations SNF    Equipment Recommendations  None recommended by PT (TBD in next venue)    Recommendations for Other Services       Precautions / Restrictions Precautions Precautions: Fall Restrictions Weight Bearing Restrictions: No      Mobility  Bed Mobility Overal bed mobility: Needs Assistance Bed Mobility: Supine to Sit;Sit to Supine     Supine to sit: Min assist;HOB elevated;+2 for physical assistance;+2 for safety/equipment Sit to supine: Min  assist;HOB elevated;+2 for physical assistance;+2 for safety/equipment        Transfers                    Ambulation/Gait                Stairs            Wheelchair Mobility    Modified Rankin (Stroke Patients Only)       Balance Overall balance assessment: Needs assistance Sitting-balance support: Feet unsupported;Bilateral upper extremity supported Sitting balance-Leahy Scale: Fair                                       Pertinent Vitals/Pain Pain Assessment: Faces Faces Pain Scale: No hurt    Home Living Family/patient expects to be discharged to:: Group home                 Additional Comments: Per chart, group home is single-story with 3 steps, R rail to enter    Prior Function Level of Independence: Independent               Hand Dominance        Extremity/Trunk Assessment   Upper Extremity Assessment Upper Extremity Assessment: Generalized weakness    Lower Extremity Assessment Lower Extremity Assessment: Generalized weakness    Cervical / Trunk Assessment Cervical / Trunk Assessment: Kyphotic  Communication  Communication: Expressive difficulties  Cognition Arousal/Alertness: Awake/alert Behavior During Therapy: Flat affect Overall Cognitive Status: No family/caregiver present to determine baseline cognitive functioning                                 General Comments: Difficulty verbally communicating during session.      General Comments      Exercises     Assessment/Plan    PT Assessment Patient needs continued PT services  PT Problem List Decreased strength;Decreased mobility;Decreased safety awareness;Decreased activity tolerance;Cardiopulmonary status limiting activity;Decreased balance;Decreased cognition;Decreased knowledge of use of DME       PT Treatment Interventions DME instruction;Therapeutic activities;Modalities;Cognitive remediation;Gait  training;Therapeutic exercise;Patient/family education;Stair training;Balance training;Functional mobility training;Neuromuscular re-education;Manual techniques    PT Goals (Current goals can be found in the Care Plan section)  Acute Rehab PT Goals PT Goal Formulation: Patient unable to participate in goal setting Time For Goal Achievement: 06/27/20 Potential to Achieve Goals: Fair    Frequency Min 2X/week   Barriers to discharge Decreased caregiver support      Co-evaluation PT/OT/SLP Co-Evaluation/Treatment: Yes Reason for Co-Treatment: Complexity of the patient's impairments (multi-system involvement);Necessary to address cognition/behavior during functional activity;For patient/therapist safety PT goals addressed during session: Mobility/safety with mobility;Balance;Strengthening/ROM         AM-PAC PT "6 Clicks" Mobility  Outcome Measure Help needed turning from your back to your side while in a flat bed without using bedrails?: A Little Help needed moving from lying on your back to sitting on the side of a flat bed without using bedrails?: A Lot Help needed moving to and from a bed to a chair (including a wheelchair)?: Total Help needed standing up from a chair using your arms (e.g., wheelchair or bedside chair)?: Total Help needed to walk in hospital room?: Total Help needed climbing 3-5 steps with a railing? : Total 6 Click Score: 9    End of Session Equipment Utilized During Treatment: Oxygen Activity Tolerance: Treatment limited secondary to medical complications (Comment) Patient left: in bed;with call bell/phone within reach;with bed alarm set (BUE mittens donned) Nurse Communication:  (MD notified of vitals during session) PT Visit Diagnosis: Muscle weakness (generalized) (M62.81);Difficulty in walking, not elsewhere classified (R26.2);Unsteadiness on feet (R26.81)    Time: 7494-4967 PT Time Calculation (min) (ACUTE ONLY): 11 min   Charges:   PT Evaluation $PT  Eval High Complexity: 1 High          Lavone Nian, PT, DPT 06/13/20, 2:45 PM   Waunita Schooner 06/13/2020, 2:42 PM

## 2020-06-13 NOTE — Progress Notes (Signed)
Pt extubated this AM at 0900,  Different methods attempted t/o shift to keep him calm with stable vital signs.  By 5809 he was on BiPap but yelling out aggressively, attempting to get OOB, combative w/staff.  Precedex was started to try to keep him calm and he was placed on HFNC.  His WOB was marked, tripoding in bed, RR in 40s, tachycardic, and hypertensive, visible diaphoresis, visible signs of air hunger.  Pt was reintubated at 1700  Hypotensive afterward requiring levo restarted.   Spoke w Dealer at his care home [facility]) prior.  She states his sister "is hard to get up with" and "she doesn't know as much about him as I do."  Sister [name]) has been unreachable by phone so far.

## 2020-06-13 NOTE — Progress Notes (Signed)
NAME:  BERISH BOHMAN, MRN:  [id number], DOB:  19-Apr-1950, LOS: 6 ADMISSION DATE:  05/25/2020, CONSULTATION DATE:  06/11/20 REFERRING MD:  Rhunette Croft. Blount, NP, CHIEF COMPLAINT:   Shortness of Breath  History of Present Illness:  70 yo M who presented to [facility] ED via EMS from the group home where he resides with c/o shortness of breath that started the same day (06/05/2020) & chest tightness. EMS administered nebulizer treatments & solu-medrol IV, upon reaching the ED the patient reported his breathing felt better. ED course: Patient received 3 breathing treatments & Rocephin IV with SpO2 improvement from mid 80's to 91% at rest on room air. SpO2 dropped to 88% with ambulation and TRH was consulted to admit the patient with COPD exacerbation. Initial Vitals: afebrile, tachypneic at 32, tachycardic at 109, BP: 110/78 (89) & SpO2 at 98% on 4 L [place]. Significant Labs: CXR clear, troponin negative, COVID-19 & Influenza A/B negative, no other abnormalities noted at admission.   Significant Hospital Events: Including procedures, antibiotic start and stop dates in addition to other pertinent events   . 05/30/2020- Admit to Medical surgical floor by [facility] for COPD exacerbation . 06/10/20- Deterioration requiring BIPAP and transfer to SDU, ultimately failing BIPAP requiring emergent intubation & mechanical ventilation. . 06/12/20- patient is mildy improved.  He is off vasopressor and is getting weaned on ventilator in preparation for SBT.  . 06/13/20-  Patient Liberated from MV on HFNC.  Patient placement to [facility] signed out - transferred to step down unit. SLP today.     Hospital course: Patient admitted to Med-Surgical floor for COPD exacerbation. Patient receiving bronchodilator therapy, mucolytics, steroids & Doxycycline. On 06/10/20 patient feeling more short of breath, with audible wheezing. ABG: 7.35/ 65/ 102/ 35.9. Solu-medrol IV increased to Q 6 h & BIPAP ordered QHS. Rapid response called later on in the evening due  to increased WOB and continued SOB. Patient placed on BIPAP, repeat ABG: 7.28/ 81/ 127/ 38.1. Patient able to speak in complete sentences stating his breathing "feels better". Transferred to SDU for monitoring. Patient deteriorated after 4 hours on BIPAP with increased accessory muscle use, lethargy, tachycardia & hypotension. PCCM consulted for emergent intubation.  Pertinent  Medical History  COPD- not on chronic O2 Schizophrenia Dementia HTN Current Smoker - 8 cigarettes daily .    Objective   Blood pressure 94/66, pulse 78, temperature 99.6 F (37.6 C), temperature source Oral, resp. rate 20, height 5\' 4"  (1.626 m), weight 59.4 kg, SpO2 94 %.    Vent Mode: Spontaneous FiO2 (%):  [45 %] 45 % Set Rate:  [20 bmp] 20 bmp Vt Set:  [450 mL] 450 mL PEEP:  [5 cmH20] 5 cmH20 Pressure Support:  [5 cmH20] 5 cmH20 Plateau Pressure:  [12 cmH20] 12 cmH20   Intake/Output Summary (Last 24 hours) at 06/13/2020 1601 Last data filed at 06/13/2020 0600 Gross per 24 hour  Intake 622.32 ml  Output 1675 ml  Net -1052.68 ml   Filed Weights   06/05/2020 0028  Weight: 59.4 kg    Examination: General: Adult male age appropriate s/p MV extubation to HFNC HEENT: MM pink/moist, anicteric, atraumatic, neck supple Neuro: poorly responsive immediately post extubation CV: s1s2 RRR, ST on monitor, no r/m/g Pulm: MV sounds without rhonchi GI: soft, rounded,distended Skin:  no rashes/lesions noted Extremities: warm/dry, pulses + 2 R/P, no edema noted   Resolved Hospital Problem list     Assessment & Plan:     1. Acute Hypoxic / Hypercapnic  Respiratory Failure due to severe COPD exacerbation   -patient extubated -06/13/20- -advanced emphysema/COPD -continue xopenex patient with tachycardia -steroids IV solumedrol -40 bid with weaning plan  - encourage IS daily  - Flutter valve  PT/OT -palliative care evaluation    Dementia with mood disorder - continue home regimen: Aricept, trazodone,  Abilify & Seroquel -close monitoring -speech and swallow evaluation today  Hypertension - irbesartan & amlodipine discontinued due to hypotension, consider restarting as patient stabilizes  Hyperglycemia in the setting of steroid use - CBG Q 4 monitoring with SSI coverage - follow ICU hypo/ hyper-glycemia protocol  Best practice (right click and "Reselect all SmartList Selections" daily)  Diet:  NPO Pain/Anxiety/Delirium protocol (if indicated): Yes (RASS goal -1) VAP protocol (if indicated): Yes DVT prophylaxis: LMWH GI prophylaxis: PPI Glucose control:  SSI Yes Central venous access:  Will be placing due to vasopressor needs Arterial line:  N/A Foley:  Will be placing due to retention Mobility:  bed rest  PT consulted: N/A Last date of multidisciplinary goals of care discussion 06/10/20 Code Status:  full code Disposition: ICU  Labs   CBC: Recent Labs  Lab 05/26/2020 0036 05/20/2020 0621 06/09/20 1741 06/10/20 2352 06/12/20 0418  WBC 9.9 7.8 15.3* 10.5 13.6*  NEUTROABS 7.6  --   --  9.8*  --   HGB 13.7 15.2 15.0 13.5 12.3*  HCT 42.4 47.0 46.2 42.5 39.0  MCV 91.8 92.2 93.0 94.2 93.3  PLT 200 181 197 141* 176    Basic Metabolic Panel: Recent Labs  Lab 05/26/2020 0621 06/09/20 1741 06/10/20 2352 06/12/20 0418 06/13/20 0406  NA 139 136 134* 134* 139  K 4.7 4.8 5.4* 5.2* 5.7*  CL 100 94* 92* 93* 96*  CO2 29 33* 34* 34* 36*  GLUCOSE 175* 145* 134* 117* 113*  BUN 17 19 22  52* 51*  CREATININE 1.04 0.82 0.98 1.25* 0.90  CALCIUM 9.5 9.9 9.2 9.1 9.0  MG  --   --   --  2.5* 2.9*  PHOS  --   --   --  4.5 3.6   GFR: Estimated Creatinine Clearance: 63 mL/min (by C-G formula based on SCr of 0.9 mg/dL). Recent Labs  Lab 05/26/2020 0621 06/09/20 1741 06/10/20 2352 06/12/20 0418 06/13/20 0406  PROCALCITON  --   --  <0.10 0.20 0.19  WBC 7.8 15.3* 10.5 13.6*  --     Liver Function Tests: Recent Labs  Lab 06/10/20 2352 06/12/20 0418  AST 50* 27  ALT 50* 39   ALKPHOS 64 53  BILITOT 0.8 0.7  PROT 6.1* 5.8*  ALBUMIN 3.6 3.3*   No results for input(s): LIPASE, AMYLASE in the last 168 hours. No results for input(s): AMMONIA in the last 168 hours.  ABG    Component Value Date/Time   PHART 7.32 (L) 06/11/2020 0010   PCO2ART 68 (HH) 06/11/2020 0010   PO2ART 93 06/11/2020 0010   HCO3 35.0 (H) 06/11/2020 0010   O2SAT 96.6 06/11/2020 0010     Coagulation Profile: No results for input(s): INR, PROTIME in the last 168 hours.  Cardiac Enzymes: No results for input(s): CKTOTAL, CKMB, CKMBINDEX, TROPONINI in the last 168 hours.  HbA1C: Hemoglobin A1C  Date/Time Value Ref Range Status  11/17/2013 06:18 AM 5.5 4.2 - 6.3 % Final    Comment:    The American Diabetes Association recommends that a primary goal of therapy should be <7% and that physicians should reevaluate the treatment regimen in patients with HbA1c values consistently >8%.  Hgb A1c MFr Bld  Date/Time Value Ref Range Status  05/22/2020 04:10 AM 5.9 (H) 4.8 - 5.6 % Final    Comment:    (NOTE) Pre diabetes:          5.7%-6.4%  Diabetes:              >6.4%  Glycemic control for   <7.0% adults with diabetes   01/19/2020 05:07 AM 5.2 4.8 - 5.6 % Final    Comment:    (NOTE) Pre diabetes:          5.7%-6.4%  Diabetes:              >6.4%  Glycemic control for   <7.0% adults with diabetes     CBG: Recent Labs  Lab 06/12/20 1540 06/12/20 1946 06/12/20 2357 06/13/20 0354 06/13/20 0717  GLUCAP 109* 106* 123* 110* 82    Review of Systems:   Patient in respiratory distress, lethargic and unable to participate in questions/care at this time.  Past Medical History:  He,  has a past medical history of Chronic respiratory failure with hypoxia ([place]), COPD (chronic obstructive pulmonary disease) ([place]), Hypertension, and Schizophrenia ([place]).   Surgical History:   Past Surgical History:  Procedure Laterality Date  . COLONOSCOPY    . COLONOSCOPY WITH PROPOFOL N/A  12/13/2016   Procedure: COLONOSCOPY WITH PROPOFOL;  Surgeon: Lollie Sails, MD;  Location[facility] ENDOSCOPY;  Service: Endoscopy;  Laterality: N/A;  . ESOPHAGOGASTRODUODENOSCOPY (EGD) WITH PROPOFOL N/A 12/13/2016   Procedure: ESOPHAGOGASTRODUODENOSCOPY (EGD) WITH PROPOFOL;  Surgeon: Lollie Sails, MD;  Location[facility] ENDOSCOPY;  Service: Endoscopy;  Laterality: N/A;  . left arm surgery       Social History:   reports that he has been smoking cigarettes. He has been smoking about 1.00 pack per day. He has never used smokeless tobacco. He reports current alcohol use. He reports previous drug use. Drug: Marijuana.   Family History:  His family history is negative for Prostate cancer and Bladder Cancer.   Allergies Allergies  Allergen Reactions  . Ace Inhibitors Swelling    Ok with benazapril, per grouphome     Home Medications  Prior to Admission medications   Medication Sig Start Date End Date Taking? Authorizing Provider  albuterol (VENTOLIN HFA) 108 (90 Base) MCG/ACT inhaler Inhale 2 by mouth every 4 hours as needed for wheezing, cough, and/or shortness of breath 05/23/20  Yes Rai, Ripudeep K, MD  amlodipine-olmesartan (AZOR) 10-20 MG tablet Take 1 tablet by mouth daily. 03/23/20  Yes Loel Dubonnet, NP  ARIPiprazole (ABILIFY) 30 MG tablet Take 30 mg by mouth daily. 01/29/20  Yes [provider]  aspirin EC 81 MG EC tablet Take 1 tablet (81 mg total) by mouth daily. Swallow whole. 05/24/20  Yes Rai, Ripudeep K, MD  dextromethorphan-guaiFENesin (MUCINEX DM) 30-600 MG 12hr tablet Take 1 tablet by mouth 2 (two) times daily as needed for cough. 05/23/20  Yes Rai, Ripudeep K, MD  donepezil (ARICEPT) 5 MG tablet Take 5 mg by mouth at bedtime.   Yes [provider]  mometasone-formoterol (DULERA) 100-5 MCG/ACT AERO Inhale 2 puffs into the lungs 2 (two) times daily. 05/23/20  Yes Rai, Ripudeep K, MD  pantoprazole (PROTONIX) 20 MG tablet Take 1 tablet (20 mg total) by mouth  daily. 12/18/19 12/17/20 Yes Lavonia Drafts, MD  QUEtiapine (SEROQUEL) 200 MG tablet Take 3 tablets (600 mg total) by mouth at bedtime. 02/03/20  Yes Harvest Dark, MD  traZODone (DESYREL) 100 MG  tablet Take 100 mg by mouth at bedtime. 02/24/20  Yes [provider]  ipratropium-albuterol (DUONEB) 0.5-2.5 (3) MG/3ML SOLN Take 3 mLs by nebulization every 4 (four) hours for 14 days. Patient taking differently: Take 3 mLs by nebulization every 4 (four) hours as needed. 03/11/20 03/25/20  Fritzi Mandes, MD  nitroGLYCERIN (NITROSTAT) 0.4 MG SL tablet Place 1 tablet (0.4 mg total) under the tongue every 5 (five) minutes as needed for chest pain. 05/23/20   Rai, Vernelle Emerald, MD  predniSONE (DELTASONE) 10 MG tablet Prednisone dosing: Take  Prednisone 40mg  (4 tabs) x 3 days, then taper to 30mg  (3 tabs) x 3 days, then 20mg  (2 tabs) x 3days, then 10mg  (1 tab) x 3days, then OFF. Patient not taking: Reported on 05/28/2020 05/23/20   Mendel Corning, MD     Critical care provider statement:    Critical care time (minutes):  33   Critical care time was exclusive of:  Separately billable procedures and  treating other patients   Critical care was necessary to treat or prevent imminent or  life-threatening deterioration of the following conditions:  acute hypoxemic respiratory failure due to severe COPD   Critical care was time spent personally by me on the following  activities:  Development of treatment plan with patient or surrogate,  discussions with consultants, evaluation of patient's response to  treatment, examination of patient, obtaining history from patient or  surrogate, ordering and performing treatments and interventions, ordering  and review of laboratory studies and re-evaluation of patient's condition   I assumed direction of critical care for this patient from another  provider in my specialty: no           Ottie Glazier, M.D.  Pulmonary & [place]

## 2020-06-13 NOTE — Consult Note (Addendum)
Consultation Note Date: 06/13/2020   Patient Name: Carlos Nelson  DOB: 02/12/1950  MRN: [id number]  Age / Sex: 70 y.o., male  PCP: [facility] Referring Physician: Ottie Glazier, MD  Reason for Consultation: Establishing goals of care  HPI/Patient Profile: 70 yo M who presented [facility] ED via EMS from the group home where he resides with c/o shortness of breath that started the same day (06/06/2020) & chest tightness. EMS administered nebulizer treatments & solu-medrol IV, upon reaching the ED the patient reported his breathing felt better.  Clinical Assessment and Goals of Care: Patient is sitting in bed with mittens in place. He is working to breathe on high flow cannula. He was just extubated this morning. Staff trying to figure out [place].  Primary RN has spoken with Ann Held at group home. 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 He has a sister. Tried to ask Brea questions. Unable to understand his verbal responses. Upon asking if we could call his sister to help with decision making he nodded his head. Called his sister and received a message that the person is not accepting phone calls at this time. Upon asking if we could speak with Tomi Bamberger to help with decision making he sat up in bed and became very agitated.   Plans to place patient on BIPAP at this time. Full code/full scope. TOC may need to request law enforcement to go to sister's house or request APS emergency guardianship.      SUMMARY OF RECOMMENDATIONS   Full code/full scope.      Primary Diagnoses: Present on Admission: . COPD exacerbation ([place]) . COPD with acute exacerbation ([place])   I have reviewed the medical record, interviewed the patient and family, and examined the patient. The following aspects are pertinent.  Past Medical History:  Diagnosis Date  . Chronic respiratory  failure with hypoxia ([place])   . COPD (chronic obstructive pulmonary disease) ([place])   . Hypertension   . Schizophrenia [facility])    Social History   Socioeconomic History  . Marital status: Single    Spouse name: Not on file  . Number of children: Not on file  . Years of education: Not on file  . Highest education level: Not on file  Occupational History  . Not on file  Tobacco Use  . Smoking status: Current Every Day Smoker    Packs/day: 1.00    Types: Cigarettes  . Smokeless tobacco: Never Used  Vaping Use  . Vaping Use: Never used  Substance and Sexual Activity  . Alcohol use: Yes    Alcohol/week: 0.0 standard drinks    Comment: beer  . Drug use: Not Currently    Types: Marijuana  . Sexual activity: Not Currently  Other Topics Concern  . Not on file  Social History Narrative   Lives in Group Home   Social Determinants of Health   Financial Resource Strain: Not on file  Food Insecurity: Not on file  Transportation Needs:  Not on file  Physical Activity: Not on file  Stress: Not on file  Social Connections: Not on file   Family History  Problem Relation Age of Onset  . Prostate cancer Neg Hx   . Bladder Cancer Neg Hx    Scheduled Meds: . ARIPiprazole  30 mg Per Tube Daily  . aspirin  81 mg Per Tube Daily  . budesonide (PULMICORT) nebulizer solution  0.25 mg Nebulization BID  . Chlorhexidine Gluconate Cloth  6 each Topical Daily  . docusate  100 mg Per Tube BID  . donepezil  5 mg Per Tube QHS  . enoxaparin (LOVENOX) injection  40 mg Subcutaneous Q24H  . insulin aspart  0-9 Units Subcutaneous Q4H  . mouth rinse  15 mL Mouth Rinse BID  . methylPREDNISolone (SOLU-MEDROL) injection  40 mg Intravenous Q12H  . polyethylene glycol  17 g Per Tube Daily   Continuous Infusions: . dexmedetomidine (PRECEDEX) IV infusion Stopped (06/13/20 0902)  . fentaNYL infusion INTRAVENOUS Stopped (06/13/20 0842)  . vasopressin Stopped (06/11/20 2353)   PRN Meds:.acetaminophen **OR**  acetaminophen, fentaNYL, metoprolol tartrate, nitroGLYCERIN, traZODone Medications Prior to Admission:  Prior to Admission medications   Medication Sig Start Date End Date Taking? Authorizing Provider  albuterol (VENTOLIN HFA) 108 (90 Base) MCG/ACT inhaler Inhale 2 by mouth every 4 hours as needed for wheezing, cough, and/or shortness of breath 05/23/20  Yes Rai, Ripudeep K, MD  amlodipine-olmesartan (AZOR) 10-20 MG tablet Take 1 tablet by mouth daily. 03/23/20  Yes Loel Dubonnet, NP  ARIPiprazole (ABILIFY) 30 MG tablet Take 30 mg by mouth daily. 01/29/20  Yes [provider]  aspirin EC 81 MG EC tablet Take 1 tablet (81 mg total) by mouth daily. Swallow whole. 05/24/20  Yes Rai, Ripudeep K, MD  dextromethorphan-guaiFENesin (MUCINEX DM) 30-600 MG 12hr tablet Take 1 tablet by mouth 2 (two) times daily as needed for cough. 05/23/20  Yes Rai, Ripudeep K, MD  donepezil (ARICEPT) 5 MG tablet Take 5 mg by mouth at bedtime.   Yes [provider]  mometasone-formoterol (DULERA) 100-5 MCG/ACT AERO Inhale 2 puffs into the lungs 2 (two) times daily. 05/23/20  Yes Rai, Ripudeep K, MD  pantoprazole (PROTONIX) 20 MG tablet Take 1 tablet (20 mg total) by mouth daily. 12/18/19 12/17/20 Yes Lavonia Drafts, MD  QUEtiapine (SEROQUEL) 200 MG tablet Take 3 tablets (600 mg total) by mouth at bedtime. 02/03/20  Yes Harvest Dark, MD  traZODone (DESYREL) 100 MG tablet Take 100 mg by mouth at bedtime. 02/24/20  Yes [provider]  ipratropium-albuterol (DUONEB) 0.5-2.5 (3) MG/3ML SOLN Take 3 mLs by nebulization every 4 (four) hours for 14 days. Patient taking differently: Take 3 mLs by nebulization every 4 (four) hours as needed. 03/11/20 03/25/20  Fritzi Mandes, MD  nitroGLYCERIN (NITROSTAT) 0.4 MG SL tablet Place 1 tablet (0.4 mg total) under the tongue every 5 (five) minutes as needed for chest pain. 05/23/20   Rai, Vernelle Emerald, MD  predniSONE (DELTASONE) 10 MG tablet Prednisone dosing: Take  Prednisone  40mg  (4 tabs) x 3 days, then taper to 30mg  (3 tabs) x 3 days, then 20mg  (2 tabs) x 3days, then 10mg  (1 tab) x 3days, then OFF. Patient not taking: Reported on 06/09/2020 05/23/20   Mendel Corning, MD   Allergies  Allergen Reactions  . Ace Inhibitors Swelling    Ok with benazapril, per grouphome   Review of Systems  Unable to perform ROS   Physical Exam Pulmonary:  Effort: Respiratory distress present.  Neurological:     Mental Status: He is alert.     Vital Signs: BP (!) 165/90   Pulse (!) 104   Temp 98.5 F (36.9 C) (Axillary)   Resp (!) 27   Ht 5\' 4"  (1.626 m)   Wt 59.4 kg   SpO2 94%   BMI 22.49 kg/m  Pain Scale: CPOT POSS *See Group Information*: 2-Acceptable,Slightly drowsy, easily aroused Pain Score: 0-No pain   SpO2: SpO2: 94 % O2 Device:SpO2: 94 % O2 Flow Rate: .O2 Flow Rate (L/min): 50 L/min  IO: Intake/output summary:   Intake/Output Summary (Last 24 hours) at 06/13/2020 1520 Last data filed at 06/13/2020 1200 Gross per 24 hour  Intake 582.68 ml  Output 2310 ml  Net -1727.32 ml    LBM: Last BM Date: 06/08/20 Baseline Weight: Weight: 59.4 kg Most recent weight: Weight: 59.4 kg      Time In: 2:55 Time Out: 3:25 Time Total: 30 min Greater than 50%  of this time was spent counseling and coordinating care related to the above assessment and plan.  Signed by: Asencion Gowda, NP   Please contact Palliative Medicine Team phone at [phone] for questions and concerns.  For individual provider: See Shea Evans

## 2020-06-13 NOTE — Progress Notes (Signed)
Pt extubated and placed on HFNC 45% at 55L. Will continue to monitor.

## 2020-06-14 DIAGNOSIS — Z7189 Other specified counseling: Secondary | ICD-10-CM | POA: Diagnosis not present

## 2020-06-14 LAB — CBC
HCT: 40.5 % (ref 39.0–52.0)
Hemoglobin: 12.5 g/dL — ABNORMAL LOW (ref 13.0–17.0)
MCH: 29.6 pg (ref 26.0–34.0)
MCHC: 30.9 g/dL (ref 30.0–36.0)
MCV: 95.7 fL (ref 80.0–100.0)
Platelets: 200 10*3/uL (ref 150–400)
RBC: 4.23 MIL/uL (ref 4.22–5.81)
RDW: 13.2 % (ref 11.5–15.5)
WBC: 13.5 10*3/uL — ABNORMAL HIGH (ref 4.0–10.5)
nRBC: 0 % (ref 0.0–0.2)

## 2020-06-14 LAB — GLUCOSE, CAPILLARY
Glucose-Capillary: 109 mg/dL — ABNORMAL HIGH (ref 70–99)
Glucose-Capillary: 129 mg/dL — ABNORMAL HIGH (ref 70–99)
Glucose-Capillary: 130 mg/dL — ABNORMAL HIGH (ref 70–99)
Glucose-Capillary: 134 mg/dL — ABNORMAL HIGH (ref 70–99)
Glucose-Capillary: 146 mg/dL — ABNORMAL HIGH (ref 70–99)
Glucose-Capillary: 156 mg/dL — ABNORMAL HIGH (ref 70–99)
Glucose-Capillary: 99 mg/dL (ref 70–99)

## 2020-06-14 LAB — MAGNESIUM: Magnesium: 2.7 mg/dL — ABNORMAL HIGH (ref 1.7–2.4)

## 2020-06-14 LAB — BASIC METABOLIC PANEL
Anion gap: 6 (ref 5–15)
BUN: 47 mg/dL — ABNORMAL HIGH (ref 8–23)
CO2: 38 mmol/L — ABNORMAL HIGH (ref 22–32)
Calcium: 9.2 mg/dL (ref 8.9–10.3)
Chloride: 98 mmol/L (ref 98–111)
Creatinine, Ser: 1.09 mg/dL (ref 0.61–1.24)
GFR, Estimated: >60 mL/min (ref >60)
Glucose, Bld: 113 mg/dL — ABNORMAL HIGH (ref 70–99)
Potassium: 5.8 mmol/L — ABNORMAL HIGH (ref 3.5–5.1)
Sodium: 142 mmol/L (ref 135–145)

## 2020-06-14 LAB — PHOSPHORUS: Phosphorus: 3.9 mg/dL (ref 2.5–4.6)

## 2020-06-14 MED ORDER — SODIUM ZIRCONIUM CYCLOSILICATE 5 G PO PACK
10.0000 g | PACK | Freq: Every day | ORAL | Status: DC
  Administered 2020-06-14 – 2020-06-16 (×3): 10 g
  Filled 2020-06-14 (×3): qty 2

## 2020-06-14 MED ORDER — CHLORHEXIDINE GLUCONATE 0.12 % MT SOLN
OROMUCOSAL | Status: AC
  Administered 2020-06-14: 15 mL
  Filled 2020-06-14: qty 15

## 2020-06-14 MED ORDER — NEPRO/CARBSTEADY PO LIQD
1000.0000 mL | ORAL | Status: DC
  Administered 2020-06-14 – 2020-06-16 (×3): 1000 mL

## 2020-06-14 NOTE — Plan of Care (Signed)
Discussed in front of patient plan of care for the evening, pain management and bowel movements with no evidence of learning at this time.  Problem: Health Behavior/Discharge Planning: Goal: Ability to manage health-related needs will improve Outcome: Progressing

## 2020-06-14 NOTE — Progress Notes (Signed)
SLP Cancellation Note  Patient Details Name: Carlos Nelson MRN: [id number] DOB: 06/15/1950   Cancelled treatment:       Reason Eval/Treat Not Completed: Medical issues which prohibited therapy;Patient not medically ready (chart reviewed; observed pt) Per chart notes, pt was extubated yesterday AM. Speech therapy held on BSE then d/t recent extubation. By the afternoon, pt was agitated/combative requiring Precedex. WOB continued; pt was reintubated by the end of the afternoon d/t RR in 40s, tachycardic, and hypertensive, visible diaphoresis, visible signs of air hunger.   ST services will sign off at this time. MD to reconsult if further needs in the future.      Orinda Kenner, MS, CCC-SLP Speech Language Pathologist Rehab Services [phone] [facility] 06/14/2020, 12:02 PM

## 2020-06-14 NOTE — Progress Notes (Signed)
Initial Nutrition Assessment  DOCUMENTATION CODES:  Severe malnutrition in context of chronic illness  INTERVENTION:   Initiate tube feeding via OGT:  Initiate Nepro at 25 ml/h. Hold at trickle until ok to advance per surgery team. Goal rate is 12mL/h (1080 ml per day)  Free water flush 65mL q4h  Goal rate provides 1912 kcal, 88 gm protein, 965 ml free water daily  Request new measured weight  NUTRITION DIAGNOSIS:  Severe Malnutrition related to chronic illness (COPD) as evidenced by severe fat depletion,severe muscle depletion.  GOAL:  Patient will meet greater than or equal to 90% of their needs  MONITOR:  TF tolerance,Vent status,Labs,I & O's  REASON FOR ASSESSMENT:  Ventilator    ASSESSMENT:  Pt presented to ED 5/24 with SOB and chest tightness. Resides at group home at baseline. Workup in ED consistent with COPD exacerbation. PMH includes COPD, hypertension, schizophrenia   Pt initially admitted from ED 5/24 and treated for COPD exacerbation. Rapid response was called for respiratory status 5/27 and pt was intubated early in the AM 5/28. Moved to ICU. Pt extubated in the AM of 5/30 but had to be reintubated ~8 hours later for increased work of breathing.    Pt intubated and sedated at the time of visit. Discussed in IDT rounds, per surgery, ok to start trickle feeds today. Requiring low dose pressor support. K high, will start Nepro to minimize amount of K being infused.   5/24 - Admitted to [facility] 5/28 - Intubated 5/30 - extubated, reintubated later that evening 5/31 - EN feeds initiated   +OGT, verified by XR (RN reports advancing tube after radiology report 5/30)  Patient is currently intubated on ventilator support MV: 9.6 L/min Temp (24hrs), Avg:99.7 F (37.6 C), Min:98.78 F (37.1 C), Max:100.22 F (37.9 C)   Intake/Output Summary (Last 24 hours) at 06/14/2020 1608 Last data filed at 06/14/2020 1200 Gross per 24 hour  Intake 630.85 ml  Output 1215 ml   Net -584.15 ml  Net IO Since Admission: -6,721.02 mL [06/14/20 1608]  Average Meal Intake Prior to Intubation: . 5/24-5/28: 78% intake x 12 recorded meals (0-100%)  Nutritionally Relevant Medications: Scheduled Meds: . docusate  100 mg Per Tube BID  . insulin aspart  0-9 Units Subcutaneous Q4H  . methylPREDNISolone (SOLU-MEDROL) injection  40 mg Intravenous Q12H  . polyethylene glycol  17 g Per Tube Daily  . sodium zirconium cyclosilicate  10 g Per Tube Daily   Continuous Infusions: . dexmedetomidine (PRECEDEX) IV infusion 0.8 mcg/kg/hr (06/14/20 0800)  . fentaNYL infusion INTRAVENOUS 100 mcg/hr (06/14/20 0800)  . norepinephrine (LEVOPHED) Adult infusion 2 mcg/min (06/14/20 0800)   Labs reviewed:  K 5.8  BUN 47  MG 2.7  SBG ranges from 82-134 mg/dL over the last 24 hours  HgbA1c 5.9% (5/8)  NUTRITION - FOCUSED PHYSICAL EXAM: Flowsheet Row Most Recent Value  Orbital Region Severe depletion  Upper Arm Region Severe depletion  Thoracic and Lumbar Region Moderate depletion  Buccal Region Severe depletion  Temple Region Severe depletion  Clavicle Bone Region Mild depletion  Clavicle and Acromion Bone Region Moderate depletion  Scapular Bone Region Moderate depletion  Dorsal Hand Unable to assess  [mittens in place for safety]  Patellar Region Severe depletion  Anterior Thigh Region Severe depletion  Posterior Calf Region Moderate depletion  Edema (RD Assessment) None  Hair Reviewed  Eyes Unable to assess  Mouth Unable to assess  Skin Reviewed  Nails Unable to assess     Diet Order:  Diet Order            Diet NPO time specified  Diet effective now                EDUCATION NEEDS:  Not appropriate for education at this time  Skin:  Skin Assessment: Reviewed RN Assessment (ecchymosis to the arms, abdomen, and back)  Last BM:  unsure, bowel regimen in place  Height:  Ht Readings from Last 1 Encounters:  06/12/20 5\' 4"  (1.626 m)    Weight:  Wt  Readings from Last 1 Encounters:  06/11/2020 59.4 kg    Ideal Body Weight:  59.1 kg  BMI:  Body mass index is 22.49 kg/m.  Estimated Nutritional Needs:   Kcal:  1700-1900 kcal/d  Protein:  75-90 g/d  Fluid:  1.8L/d  Ranell Patrick, RD, LDN Clinical Dietitian Pager on [place]

## 2020-06-14 NOTE — Progress Notes (Signed)
NAME:  Carlos Nelson, MRN:  [id number], DOB:  05-14-1950, LOS: 6 ADMISSION DATE:  05/16/2020, CONSULTATION DATE:  06/11/20 REFERRING MD:  Rhunette Croft. Blount, NP, CHIEF COMPLAINT:   Shortness of Breath  History of Present Illness:  70 yo M who presented to [facility] ED via EMS from the group home where he resides with c/o shortness of breath that started the same day (06/03/2020) & chest tightness. EMS administered nebulizer treatments & solu-medrol IV, upon reaching the ED the patient reported his breathing felt better. ED course: Patient received 3 breathing treatments & Rocephin IV with SpO2 improvement from mid 80's to 91% at rest on room air. SpO2 dropped to 88% with ambulation and TRH was consulted to admit the patient with COPD exacerbation. Initial Vitals: afebrile, tachypneic at 32, tachycardic at 109, BP: 110/78 (89) & SpO2 at 98% on 4 L [place]. Significant Labs: CXR clear, troponin negative, COVID-19 & Influenza A/B negative, no other abnormalities noted at admission.   Significant Hospital Events: Including procedures, antibiotic start and stop dates in addition to other pertinent events   . 06/04/2020- Admit to Medical surgical floor by [facility] for COPD exacerbation . 06/10/20- Deterioration requiring BIPAP and transfer to SDU, ultimately failing BIPAP requiring emergent intubation & mechanical ventilation. . 06/12/20- patient is mildy improved.  He is off vasopressor and is getting weaned on ventilator in preparation for SBT.  . 06/13/20-  Patient Liberated from MV on HFNC.  Patient placement to [facility] signed out - transferred to step down unit. SLP today.  . 06/14/20-patient reintubated with BIPAP failure, goal of care discussion with palliative in process.     Hospital course: Patient admitted to Med-Surgical floor for COPD exacerbation. Patient receiving bronchodilator therapy, mucolytics, steroids & Doxycycline. On 06/10/20 patient feeling more short of breath, with audible wheezing. ABG: 7.35/ 65/ 102/ 35.9.  Solu-medrol IV increased to Q 6 h & BIPAP ordered QHS. Rapid response called later on in the evening due to increased WOB and continued SOB. Patient placed on BIPAP, repeat ABG: 7.28/ 81/ 127/ 38.1. Patient able to speak in complete sentences stating his breathing "feels better". Transferred to SDU for monitoring. Patient deteriorated after 4 hours on BIPAP with increased accessory muscle use, lethargy, tachycardia & hypotension. PCCM consulted for emergent intubation.  Pertinent  Medical History  COPD- not on chronic O2 Schizophrenia Dementia HTN Current Smoker - 8 cigarettes daily .    Objective   Blood pressure 105/68, pulse 67, temperature 100.04 F (37.8 C), temperature source Esophageal, resp. rate (!) 22, height 5\' 4"  (1.626 m), weight 59.4 kg, SpO2 96 %.    Vent Mode: PRVC FiO2 (%):  [40 %-50 %] 40 % Set Rate:  [20 bmp-22 bmp] 22 bmp Vt Set:  [450 mL] 450 mL PEEP:  [5 cmH20] 5 cmH20 Plateau Pressure:  [0 cmH20] 0 cmH20   Intake/Output Summary (Last 24 hours) at 06/14/2020 1333 Last data filed at 06/14/2020 1200 Gross per 24 hour  Intake 634.05 ml  Output 1665 ml  Net -1030.95 ml   Filed Weights   06/03/2020 0028  Weight: 59.4 kg    Examination: General: Adult male age appropriate s/p MV wETT HEENT: MM pink/moist, anicteric, atraumatic, neck supple Neuro: pGCS4t CV: s1s2 RRR, ST on monitor, no r/m/g Pulm: MV sounds without rhonchi GI: soft, rounded,distended Skin:  no rashes/lesions noted Extremities: warm/dry, pulses + 2 R/P, no edema noted   Resolved Hospital Problem list     Assessment & Plan:  1. Acute Hypoxic / Hypercapnic Respiratory Failure due to severe COPD exacerbation  -patient re-intubated -06/13/20- -advanced emphysema/COPD -continue xopenex patient with tachycardia -steroids IV solumedrol -40 bid with weaning plan  -full MV support -tracheal aspirate  PT/OT -palliative care evaluation    Dementia with mood disorder - continue home  regimen: Aricept, trazodone, Abilify & Seroquel -close monitoring -speech and swallow evaluation if survives  Hypertension - irbesartan & amlodipine discontinued due to hypotension, consider restarting as patient stabilizes  Hyperglycemia in the setting of steroid use - CBG Q 4 monitoring with SSI coverage - follow ICU hypo/ hyper-glycemia protocol  Best practice (right click and "Reselect all SmartList Selections" daily)  Diet:  NPO Pain/Anxiety/Delirium protocol (if indicated): Yes (RASS goal -1) VAP protocol (if indicated): Yes DVT prophylaxis: LMWH GI prophylaxis: PPI Glucose control:  SSI Yes Central venous access:  Will be placing due to vasopressor needs Arterial line:  N/A Foley:  Will be placing due to retention Mobility:  bed rest  PT consulted: N/A Last date of multidisciplinary goals of care discussion 06/10/20 Code Status:  full code Disposition: ICU  Labs   CBC: Recent Labs  Lab 06/09/20 1741 06/10/20 2352 06/12/20 0418 06/14/20 0405  WBC 15.3* 10.5 13.6* 13.5*  NEUTROABS  --  9.8*  --   --   HGB 15.0 13.5 12.3* 12.5*  HCT 46.2 42.5 39.0 40.5  MCV 93.0 94.2 93.3 95.7  PLT 197 141* 161 376    Basic Metabolic Panel: Recent Labs  Lab 06/09/20 1741 06/10/20 2352 06/12/20 0418 06/13/20 0406 06/13/20 1239 06/14/20 0405  NA 136 134* 134* 139  --  142  K 4.8 5.4* 5.2* 5.7* 5.4* 5.8*  CL 94* 92* 93* 96*  --  98  CO2 33* 34* 34* 36*  --  38*  GLUCOSE 145* 134* 117* 113*  --  113*  BUN 19 22 52* 51*  --  47*  CREATININE 0.82 0.98 1.25* 0.90  --  1.09  CALCIUM 9.9 9.2 9.1 9.0  --  9.2  MG  --   --  2.5* 2.9*  --  2.7*  PHOS  --   --  4.5 3.6  --  3.9   GFR: Estimated Creatinine Clearance: 52 mL/min (by C-G formula based on SCr of 1.09 mg/dL). Recent Labs  Lab 06/09/20 1741 06/10/20 2352 06/12/20 0418 06/13/20 0406 06/14/20 0405  PROCALCITON  --  <0.10 0.20 0.19  --   WBC 15.3* 10.5 13.6*  --  13.5*    Liver Function Tests: Recent Labs   Lab 06/10/20 2352 06/12/20 0418  AST 50* 27  ALT 50* 39  ALKPHOS 64 53  BILITOT 0.8 0.7  PROT 6.1* 5.8*  ALBUMIN 3.6 3.3*   No results for input(s): LIPASE, AMYLASE in the last 168 hours. No results for input(s): AMMONIA in the last 168 hours.  ABG    Component Value Date/Time   PHART 7.39 06/13/2020 1811   PCO2ART 76 (HH) 06/13/2020 1811   PO2ART 67 (L) 06/13/2020 1811   HCO3 46.0 (H) 06/13/2020 1811   O2SAT 92.8 06/13/2020 1811     Coagulation Profile: No results for input(s): INR, PROTIME in the last 168 hours.  Cardiac Enzymes: No results for input(s): CKTOTAL, CKMB, CKMBINDEX, TROPONINI in the last 168 hours.  HbA1C: Hemoglobin A1C  Date/Time Value Ref Range Status  11/17/2013 06:18 AM 5.5 4.2 - 6.3 % Final    Comment:    The American Diabetes Association recommends that a primary goal  of therapy should be <7% and that physicians should reevaluate the treatment regimen in patients with HbA1c values consistently >8%.    Hgb A1c MFr Bld  Date/Time Value Ref Range Status  05/22/2020 04:10 AM 5.9 (H) 4.8 - 5.6 % Final    Comment:    (NOTE) Pre diabetes:          5.7%-6.4%  Diabetes:              >6.4%  Glycemic control for   <7.0% adults with diabetes   01/19/2020 05:07 AM 5.2 4.8 - 5.6 % Final    Comment:    (NOTE) Pre diabetes:          5.7%-6.4%  Diabetes:              >6.4%  Glycemic control for   <7.0% adults with diabetes     CBG: Recent Labs  Lab 06/13/20 1936 06/14/20 0032 06/14/20 0356 06/14/20 0726 06/14/20 1107  GLUCAP 134* 134* 109* 99 129*    Review of Systems:   Patient in respiratory distress, lethargic and unable to participate in questions/care at this time.  Past Medical History:  He,  has a past medical history of Chronic respiratory failure with hypoxia ([place]), COPD (chronic obstructive pulmonary disease) ([place]), Hypertension, and Schizophrenia ([place]).   Surgical History:   Past Surgical History:  Procedure  Laterality Date  . COLONOSCOPY    . COLONOSCOPY WITH PROPOFOL N/A 12/13/2016   Procedure: COLONOSCOPY WITH PROPOFOL;  Surgeon: Lollie Sails, MD;  Location: [facility] ENDOSCOPY;  Service: Endoscopy;  Laterality: N/A;  . ESOPHAGOGASTRODUODENOSCOPY (EGD) WITH PROPOFOL N/A 12/13/2016   Procedure: ESOPHAGOGASTRODUODENOSCOPY (EGD) WITH PROPOFOL;  Surgeon: Lollie Sails, MD;  Location: [facility] ENDOSCOPY;  Service: Endoscopy;  Laterality: N/A;  . left arm surgery       Social History:   reports that he has been smoking cigarettes. He has been smoking about 1.00 pack per day. He has never used smokeless tobacco. He reports current alcohol use. He reports previous drug use. Drug: Marijuana.   Family History:  His family history is negative for Prostate cancer and Bladder Cancer.   Allergies Allergies  Allergen Reactions  . Ace Inhibitors Swelling    Ok with benazapril, per grouphome     Home Medications  Prior to Admission medications   Medication Sig Start Date End Date Taking? Authorizing Provider  albuterol (VENTOLIN HFA) 108 (90 Base) MCG/ACT inhaler Inhale 2 by mouth every 4 hours as needed for wheezing, cough, and/or shortness of breath 05/23/20  Yes Rai, Ripudeep K, MD  amlodipine-olmesartan (AZOR) 10-20 MG tablet Take 1 tablet by mouth daily. 03/23/20  Yes Loel Dubonnet, NP  ARIPiprazole (ABILIFY) 30 MG tablet Take 30 mg by mouth daily. 01/29/20  Yes [provider]  aspirin EC 81 MG EC tablet Take 1 tablet (81 mg total) by mouth daily. Swallow whole. 05/24/20  Yes Rai, Ripudeep K, MD  dextromethorphan-guaiFENesin (MUCINEX DM) 30-600 MG 12hr tablet Take 1 tablet by mouth 2 (two) times daily as needed for cough. 05/23/20  Yes Rai, Ripudeep K, MD  donepezil (ARICEPT) 5 MG tablet Take 5 mg by mouth at bedtime.   Yes [provider]  mometasone-formoterol (DULERA) 100-5 MCG/ACT AERO Inhale 2 puffs into the lungs 2 (two) times daily. 05/23/20  Yes Rai, Ripudeep K, MD   pantoprazole (PROTONIX) 20 MG tablet Take 1 tablet (20 mg total) by mouth daily. 12/18/19 12/17/20 Yes Lavonia Drafts, MD  QUEtiapine (SEROQUEL) 200  MG tablet Take 3 tablets (600 mg total) by mouth at bedtime. 02/03/20  Yes Harvest Dark, MD  traZODone (DESYREL) 100 MG tablet Take 100 mg by mouth at bedtime. 02/24/20  Yes [provider]  ipratropium-albuterol (DUONEB) 0.5-2.5 (3) MG/3ML SOLN Take 3 mLs by nebulization every 4 (four) hours for 14 days. Patient taking differently: Take 3 mLs by nebulization every 4 (four) hours as needed. 03/11/20 03/25/20  Fritzi Mandes, MD  nitroGLYCERIN (NITROSTAT) 0.4 MG SL tablet Place 1 tablet (0.4 mg total) under the tongue every 5 (five) minutes as needed for chest pain. 05/23/20   Rai, Vernelle Emerald, MD  predniSONE (DELTASONE) 10 MG tablet Prednisone dosing: Take  Prednisone 40mg  (4 tabs) x 3 days, then taper to 30mg  (3 tabs) x 3 days, then 20mg  (2 tabs) x 3days, then 10mg  (1 tab) x 3days, then OFF. Patient not taking: Reported on 05/24/2020 05/23/20   Mendel Corning, MD     Critical care provider statement:    Critical care time (minutes):  33   Critical care time was exclusive of:  Separately billable procedures and  treating other patients   Critical care was necessary to treat or prevent imminent or  life-threatening deterioration of the following conditions:  acute hypoxemic respiratory failure due to severe COPD   Critical care was time spent personally by me on the following  activities:  Development of treatment plan with patient or surrogate,  discussions with consultants, evaluation of patient's response to  treatment, examination of patient, obtaining history from patient or  surrogate, ordering and performing treatments and interventions, ordering  and review of laboratory studies and re-evaluation of patient's condition   I assumed direction of critical care for this patient from another  provider in my specialty: no           Ottie Glazier, M.D.  Pulmonary & [place]

## 2020-06-14 NOTE — Progress Notes (Signed)
NAME:  Carlos Nelson, MRN:  [id number], DOB:  November 01, 1950, LOS: 6 ADMISSION DATE:  05/28/2020, CONSULTATION DATE:  06/11/20 REFERRING MD:  Rhunette Croft. Blount, NP, CHIEF COMPLAINT:   Shortness of Breath  History of Present Illness:  70 yo M who presented to [facility] ED via EMS from the group home where he resides with c/o shortness of breath that started the same day (06/01/2020) & chest tightness. EMS administered nebulizer treatments & solu-medrol IV, upon reaching the ED the patient reported his breathing felt better. ED course: Patient received 3 breathing treatments & Rocephin IV with SpO2 improvement from mid 80's to 91% at rest on room air. SpO2 dropped to 88% with ambulation and TRH was consulted to admit the patient with COPD exacerbation. Initial Vitals: afebrile, tachypneic at 32, tachycardic at 109, BP: 110/78 (89) & SpO2 at 98% on 4 L [place]. Significant Labs: CXR clear, troponin negative, COVID-19 & Influenza A/B negative, no other abnormalities noted at admission.   Significant Hospital Events: Including procedures, antibiotic start and stop dates in addition to other pertinent events   . 05/23/2020- Admit to Medical surgical floor by [facility] for COPD exacerbation . 06/10/20- Deterioration requiring BIPAP and transfer to SDU, ultimately failing BIPAP requiring emergent intubation & mechanical ventilation. . 06/12/20- patient is mildy improved.  He is off vasopressor and is getting weaned on ventilator in preparation for SBT.  . 06/13/20-  Patient Liberated from MV on HFNC.  Patient placement to [facility] signed out - transferred to step down unit. SLP today.  . 06/14/20-patient reintubated with BIPAP failure, goal of care discussion with palliative in process.     Hospital course: Patient admitted to Med-Surgical floor for COPD exacerbation. Patient receiving bronchodilator therapy, mucolytics, steroids & Doxycycline. On 06/10/20 patient feeling more short of breath, with audible wheezing. ABG: 7.35/ 65/ 102/ 35.9.  Solu-medrol IV increased to Q 6 h & BIPAP ordered QHS. Rapid response called later on in the evening due to increased WOB and continued SOB. Patient placed on BIPAP, repeat ABG: 7.28/ 81/ 127/ 38.1. Patient able to speak in complete sentences stating his breathing "feels better". Transferred to SDU for monitoring. Patient deteriorated after 4 hours on BIPAP with increased accessory muscle use, lethargy, tachycardia & hypotension. PCCM consulted for emergent intubation.  Pertinent  Medical History  COPD- not on chronic O2 Schizophrenia Dementia HTN Current Smoker - 8 cigarettes daily .    Objective   Blood pressure 112/74, pulse 66, temperature 100.22 F (37.9 C), resp. rate (!) 22, height 5\' 4"  (1.626 m), weight 59.4 kg, SpO2 95 %.    Vent Mode: PRVC FiO2 (%):  [40 %-50 %] 50 % Set Rate:  [20 bmp-22 bmp] 22 bmp Vt Set:  [450 mL] 450 mL PEEP:  [5 cmH20] 5 cmH20 Pressure Support:  [5 cmH20] 5 cmH20 Plateau Pressure:  [0 cmH20] 0 cmH20   Intake/Output Summary (Last 24 hours) at 06/14/2020 0820 Last data filed at 06/14/2020 0165 Gross per 24 hour  Intake 422.26 ml  Output 1710 ml  Net -1287.74 ml   Filed Weights   06/06/2020 0028  Weight: 59.4 kg    Examination: General: Adult male age appropriate s/p MV wETT HEENT: MM pink/moist, anicteric, atraumatic, neck supple Neuro: pGCS4t CV: s1s2 RRR, ST on monitor, no r/m/g Pulm: MV sounds without rhonchi GI: soft, rounded,distended Skin:  no rashes/lesions noted Extremities: warm/dry, pulses + 2 R/P, no edema noted   Resolved Hospital Problem list     Assessment & Plan:  1. Acute Hypoxic / Hypercapnic Respiratory Failure due to severe COPD exacerbation  -patient re-intubated -06/13/20- -advanced emphysema/COPD -continue xopenex patient with tachycardia -steroids IV solumedrol -40 bid with weaning plan  -full MV support -tracheal aspirate  PT/OT -palliative care evaluation    Dementia with mood disorder - continue  home regimen: Aricept, trazodone, Abilify & Seroquel -close monitoring -speech and swallow evaluation if survives  Hypertension - irbesartan & amlodipine discontinued due to hypotension, consider restarting as patient stabilizes  Hyperglycemia in the setting of steroid use - CBG Q 4 monitoring with SSI coverage - follow ICU hypo/ hyper-glycemia protocol  Best practice (right click and "Reselect all SmartList Selections" daily)  Diet:  NPO Pain/Anxiety/Delirium protocol (if indicated): Yes (RASS goal -1) VAP protocol (if indicated): Yes DVT prophylaxis: LMWH GI prophylaxis: PPI Glucose control:  SSI Yes Central venous access:  Will be placing due to vasopressor needs Arterial line:  N/A Foley:  Will be placing due to retention Mobility:  bed rest  PT consulted: N/A Last date of multidisciplinary goals of care discussion 06/10/20 Code Status:  full code Disposition: ICU  Labs   CBC: Recent Labs  Lab 06/09/20 1741 06/10/20 2352 06/12/20 0418 06/14/20 0405  WBC 15.3* 10.5 13.6* 13.5*  NEUTROABS  --  9.8*  --   --   HGB 15.0 13.5 12.3* 12.5*  HCT 46.2 42.5 39.0 40.5  MCV 93.0 94.2 93.3 95.7  PLT 197 141* 161 720    Basic Metabolic Panel: Recent Labs  Lab 06/09/20 1741 06/10/20 2352 06/12/20 0418 06/13/20 0406 06/13/20 1239 06/14/20 0405  NA 136 134* 134* 139  --  142  K 4.8 5.4* 5.2* 5.7* 5.4* 5.8*  CL 94* 92* 93* 96*  --  98  CO2 33* 34* 34* 36*  --  38*  GLUCOSE 145* 134* 117* 113*  --  113*  BUN 19 22 52* 51*  --  47*  CREATININE 0.82 0.98 1.25* 0.90  --  1.09  CALCIUM 9.9 9.2 9.1 9.0  --  9.2  MG  --   --  2.5* 2.9*  --  2.7*  PHOS  --   --  4.5 3.6  --  3.9   GFR: Estimated Creatinine Clearance: 52 mL/min (by C-G formula based on SCr of 1.09 mg/dL). Recent Labs  Lab 06/09/20 1741 06/10/20 2352 06/12/20 0418 06/13/20 0406 06/14/20 0405  PROCALCITON  --  <0.10 0.20 0.19  --   WBC 15.3* 10.5 13.6*  --  13.5*    Liver Function Tests: Recent Labs   Lab 06/10/20 2352 06/12/20 0418  AST 50* 27  ALT 50* 39  ALKPHOS 64 53  BILITOT 0.8 0.7  PROT 6.1* 5.8*  ALBUMIN 3.6 3.3*   No results for input(s): LIPASE, AMYLASE in the last 168 hours. No results for input(s): AMMONIA in the last 168 hours.  ABG    Component Value Date/Time   PHART 7.39 06/13/2020 1811   PCO2ART 76 (HH) 06/13/2020 1811   PO2ART 67 (L) 06/13/2020 1811   HCO3 46.0 (H) 06/13/2020 1811   O2SAT 92.8 06/13/2020 1811     Coagulation Profile: No results for input(s): INR, PROTIME in the last 168 hours.  Cardiac Enzymes: No results for input(s): CKTOTAL, CKMB, CKMBINDEX, TROPONINI in the last 168 hours.  HbA1C: Hemoglobin A1C  Date/Time Value Ref Range Status  11/17/2013 06:18 AM 5.5 4.2 - 6.3 % Final    Comment:    The American Diabetes Association recommends that a primary goal  of therapy should be <7% and that physicians should reevaluate the treatment regimen in patients with HbA1c values consistently >8%.    Hgb A1c MFr Bld  Date/Time Value Ref Range Status  05/22/2020 04:10 AM 5.9 (H) 4.8 - 5.6 % Final    Comment:    (NOTE) Pre diabetes:          5.7%-6.4%  Diabetes:              >6.4%  Glycemic control for   <7.0% adults with diabetes   01/19/2020 05:07 AM 5.2 4.8 - 5.6 % Final    Comment:    (NOTE) Pre diabetes:          5.7%-6.4%  Diabetes:              >6.4%  Glycemic control for   <7.0% adults with diabetes     CBG: Recent Labs  Lab 06/13/20 1539 06/13/20 1936 06/14/20 0032 06/14/20 0356 06/14/20 0726  GLUCAP 109* 134* 134* 109* 99    Review of Systems:   Patient in respiratory distress, lethargic and unable to participate in questions/care at this time.  Past Medical History:  He,  has a past medical history of Chronic respiratory failure with hypoxia ([place]), COPD (chronic obstructive pulmonary disease) ([place]), Hypertension, and Schizophrenia ([place]).   Surgical History:   Past Surgical History:  Procedure  Laterality Date  . COLONOSCOPY    . COLONOSCOPY WITH PROPOFOL N/A 12/13/2016   Procedure: COLONOSCOPY WITH PROPOFOL;  Surgeon: Lollie Sails, MD;  Location: [facility] ENDOSCOPY;  Service: Endoscopy;  Laterality: N/A;  . ESOPHAGOGASTRODUODENOSCOPY (EGD) WITH PROPOFOL N/A 12/13/2016   Procedure: ESOPHAGOGASTRODUODENOSCOPY (EGD) WITH PROPOFOL;  Surgeon: Lollie Sails, MD;  [facility] ENDOSCOPY;  Service: Endoscopy;  Laterality: N/A;  . left arm surgery       Social History:   reports that he has been smoking cigarettes. He has been smoking about 1.00 pack per day. He has never used smokeless tobacco. He reports current alcohol use. He reports previous drug use. Drug: Marijuana.   Family History:  His family history is negative for Prostate cancer and Bladder Cancer.   Allergies Allergies  Allergen Reactions  . Ace Inhibitors Swelling    Ok with benazapril, per grouphome     Home Medications  Prior to Admission medications   Medication Sig Start Date End Date Taking? Authorizing Provider  albuterol (VENTOLIN HFA) 108 (90 Base) MCG/ACT inhaler Inhale 2 by mouth every 4 hours as needed for wheezing, cough, and/or shortness of breath 05/23/20  Yes Rai, Ripudeep K, MD  amlodipine-olmesartan (AZOR) 10-20 MG tablet Take 1 tablet by mouth daily. 03/23/20  Yes Loel Dubonnet, NP  ARIPiprazole (ABILIFY) 30 MG tablet Take 30 mg by mouth daily. 01/29/20  Yes [provider]  aspirin EC 81 MG EC tablet Take 1 tablet (81 mg total) by mouth daily. Swallow whole. 05/24/20  Yes Rai, Ripudeep K, MD  dextromethorphan-guaiFENesin (MUCINEX DM) 30-600 MG 12hr tablet Take 1 tablet by mouth 2 (two) times daily as needed for cough. 05/23/20  Yes Rai, Ripudeep K, MD  donepezil (ARICEPT) 5 MG tablet Take 5 mg by mouth at bedtime.   Yes [provider]  mometasone-formoterol (DULERA) 100-5 MCG/ACT AERO Inhale 2 puffs into the lungs 2 (two) times daily. 05/23/20  Yes Rai, Ripudeep K, MD   pantoprazole (PROTONIX) 20 MG tablet Take 1 tablet (20 mg total) by mouth daily. 12/18/19 12/17/20 Yes Lavonia Drafts, MD  QUEtiapine (SEROQUEL) 200  MG tablet Take 3 tablets (600 mg total) by mouth at bedtime. 02/03/20  Yes Harvest Dark, MD  traZODone (DESYREL) 100 MG tablet Take 100 mg by mouth at bedtime. 02/24/20  Yes [provider]  ipratropium-albuterol (DUONEB) 0.5-2.5 (3) MG/3ML SOLN Take 3 mLs by nebulization every 4 (four) hours for 14 days. Patient taking differently: Take 3 mLs by nebulization every 4 (four) hours as needed. 03/11/20 03/25/20  Fritzi Mandes, MD  nitroGLYCERIN (NITROSTAT) 0.4 MG SL tablet Place 1 tablet (0.4 mg total) under the tongue every 5 (five) minutes as needed for chest pain. 05/23/20   Rai, Vernelle Emerald, MD  predniSONE (DELTASONE) 10 MG tablet Prednisone dosing: Take  Prednisone 40mg  (4 tabs) x 3 days, then taper to 30mg  (3 tabs) x 3 days, then 20mg  (2 tabs) x 3days, then 10mg  (1 tab) x 3days, then OFF. Patient not taking: Reported on 05/17/2020 05/23/20   Mendel Corning, MD     Critical care provider statement:    Critical care time (minutes):  33   Critical care time was exclusive of:  Separately billable procedures and  treating other patients   Critical care was necessary to treat or prevent imminent or  life-threatening deterioration of the following conditions:  acute hypoxemic respiratory failure due to severe COPD   Critical care was time spent personally by me on the following  activities:  Development of treatment plan with patient or surrogate,  discussions with consultants, evaluation of patient's response to  treatment, examination of patient, obtaining history from patient or  surrogate, ordering and performing treatments and interventions, ordering  and review of laboratory studies and re-evaluation of patient's condition   I assumed direction of critical care for this patient from another  provider in my specialty: no           Ottie Glazier, M.D.  Pulmonary & [place]

## 2020-06-14 NOTE — Progress Notes (Signed)
Pt continues to be on fentaynl and precedex for sedation. Levophed was weaned down to 2 mcg and Vent settings have not changed. Pt has purposeful movements of all extremities, however is unable to follows commands of extremities, is able to nod. He continues to have moments of agitation and prn versed 1mg  was given once early in the shift. Total urine output was 450cc for the shift and 300cc of output from OG tube. Lokelma 10 g was given for a potassium of 5.8. Pt remains calm and alert at this time.

## 2020-06-14 NOTE — Progress Notes (Signed)
Physical Therapy Discharge Patient Details Name: Carlos Nelson MRN: [id number] DOB: March 02, 1950 Today's Date: 06/14/2020 Time:  -     Patient discharged from PT services secondary to having severe change in status since evaluation yesterday. Will sign off for now but please re-order when pt is appropriate to participate in PT.     Julaine Fusi PTA 06/14/20, 10:41 AM

## 2020-06-14 NOTE — Progress Notes (Addendum)
Daily Progress Note   Patient Name: Carlos Nelson       Date: 06/14/2020 DOB: September 16, 1950  Age: 70 y.o. MRN#: [id number] Attending Physician: Ottie Glazier, MD Primary Care Physician: [facility] Admit Date: 06/12/2020  Reason for Consultation/Follow-up: Establishing goals of care  Subjective: Patient is resting in bed. He is now back on the ventilator from unsuccessful trial of BIPAP. Unable to reach sister who would be surrogate decision maker. Waiting for a decision maker to discuss [place].   Length of Stay: 6  Current Medications: Scheduled Meds:  . ARIPiprazole  30 mg Per Tube Daily  . aspirin  81 mg Per Tube Daily  . budesonide (PULMICORT) nebulizer solution  0.25 mg Nebulization BID  . chlorhexidine gluconate (MEDLINE KIT)  15 mL Mouth Rinse BID  . Chlorhexidine Gluconate Cloth  6 each Topical Daily  . docusate  100 mg Per Tube BID  . donepezil  5 mg Per Tube QHS  . enoxaparin (LOVENOX) injection  40 mg Subcutaneous Q24H  . feeding supplement (NEPRO CARB STEADY)  1,000 mL Per Tube Q24H  . insulin aspart  0-9 Units Subcutaneous Q4H  . mouth rinse  15 mL Mouth Rinse 10 times per day  . methylPREDNISolone (SOLU-MEDROL) injection  40 mg Intravenous Q12H  . polyethylene glycol  17 g Per Tube Daily  . sodium zirconium cyclosilicate  10 g Per Tube Daily    Continuous Infusions: . dexmedetomidine (PRECEDEX) IV infusion 0.8 mcg/kg/hr (06/14/20 1437)  . fentaNYL infusion INTRAVENOUS 100 mcg/hr (06/14/20 1200)  . norepinephrine (LEVOPHED) Adult infusion 2 mcg/min (06/14/20 1200)  . vasopressin Stopped (06/11/20 2353)    PRN Meds: acetaminophen **OR** acetaminophen, fentaNYL, metoprolol tartrate, nitroGLYCERIN, traZODone  Physical Exam Constitutional:       Comments: On ventilator.              Vital Signs: BP 105/68 (BP Location: Right Arm)   Pulse 67   Temp 100.04 F (37.8 C) (Esophageal)   Resp (!) 22   Ht _0  (1.626 m)   Wt 59.4 kg   SpO2 96%   BMI 22.49 kg/m  SpO2: SpO2: 96 % O2 Device: O2 Device: Ventilator O2 Flow Rate: O2 Flow Rate (L/min): 50 L/min  Intake/output summary:   Intake/Output Summary (Last 24 hours) at 06/14/2020 1506 Last data  filed at 06/14/2020 1200 Gross per 24 hour  Intake 634.05 ml  Output 1665 ml  Net -1030.95 ml   LBM: Last BM Date: 06/08/20 Baseline Weight: Weight: 59.4 kg Most recent weight: Weight: 59.4 kg   Patient Active Problem List   Diagnosis Date Noted  . Gastroesophageal reflux disease without esophagitis   . Abdominal pain   . Dementia without behavioral disturbance ([place])   . Sepsis ([place]) 05/21/2020  . Chest pain 05/21/2020  . Acute respiratory failure with hypoxia ([place])   . Protein-calorie malnutrition, severe 03/18/2020  . COPD with acute exacerbation ([place]) 03/10/2020  . Right-sided chest pain 03/10/2020  . HLD (hyperlipidemia) 01/18/2020  . CAD (coronary artery disease) 01/18/2020  . Hypertensive urgency 01/18/2020  . Tobacco abuse 01/18/2020  . Scrotum pain   . Transaminitis   . Demand ischemia ([place]) 06/11/2019  . Malnutrition of moderate degree 06/10/2019  . NSTEMI (non-ST elevated myocardial infarction) ([place]) 06/09/2019  . Depression with anxiety 06/09/2019  . Pleuritic chest pain 05/20/2019  . CAP (community acquired pneumonia) 05/20/2019  . Essential hypertension   . COPD exacerbation ([place])   . Acute on chronic respiratory failure with hypoxia and hypercapnia (HCC)   . Altered mental status 09/25/2018  . Schizophrenia ([place])   . Tachycardia 08/21/2018  . Schizophrenia, chronic with acute exacerbation ([place]) 08/17/2018  . Elevated PSA 05/24/2015    Palliative Care Assessment & Plan    Recommendations/Plan:  Patient is on ventilator. Unable to reach sister  who would be surrogate decision maker. Will discuss GOC if a decision maker is established.  Code Status:    Code Status Orders  (From admission, onward)         Start     Ordered   05/23/2020 0423  Full code  Continuous        05/29/2020 0425        Code Status History    Date Active Date Inactive Code Status Order ID Comments User Context   05/21/2020 0750 05/23/2020 2342 Full Code [id number]  Ivor Costa, MD ED   05/09/2020 0345 05/10/2020 2359 Full Code [id number]  Athena Masse, MD ED   04/21/2020 0517 04/23/2020 2023 Full Code [id number]  Athena Masse, MD ED   03/16/2020 1052 03/18/2020 1810 Full Code [id number]  Collier Bullock, MD ED   03/10/2020 0438 03/11/2020 2007 Full Code [id number]  Athena Masse, MD ED   01/19/2020 0351 01/19/2020 2131 Full Code [id number]  Ivor Costa, MD ED   06/09/2019 1009 06/12/2019 2156 Full Code [id number]  Ivor Costa, MD ED   05/24/2019 0018 05/25/2019 1827 Full Code [id number]  Jenkins Rouge, MD ED   05/20/2019 0827 05/22/2019 2049 Full Code [id number]  Ivor Costa, MD ED   08/21/2018 0446 08/22/2018 2048 Full Code [id number]  Harrie Foreman, MD Inpatient   Advance Care Planning Activity       Prognosis: Poor    Thank you for allowing the Palliative Medicine Team to assist in the care of this patient.   Total Time 15 min Prolonged Time Billed no      Greater than 50%  of this time was spent counseling and coordinating care related to the above assessment and plan.  Asencion Gowda, NP  Please contact Palliative Medicine Team phone at [phone] for questions and concerns.

## 2020-06-14 NOTE — Progress Notes (Signed)
OT Cancellation Note  Patient Details Name: Carlos Nelson MRN: [id number] DOB: July 20, 1950   Cancelled Treatment:    Reason Eval/Treat Not Completed: Medical issues which prohibited therapy. Pt with declined in status and increased respiratory distress. OT to SIGN OFF. Please re-consult when pt is medically able to participate in therapeutic intervention.   Darleen Crocker, MS, OTR/L , CBIS ascom [phone]  06/14/20, 1:29 PM   06/14/2020, 1:28 PM

## 2020-06-14 NOTE — Plan of Care (Signed)
Pt turned Q2H this shift, VSS and vent FIO2 tittrated down to 35%.  Pt wakes spontaneously a times, is calm, follows simple commands, appears to track.  Trickle feeds started, no BM this shift.  Still requiring small dose levophed

## 2020-06-15 DIAGNOSIS — J441 Chronic obstructive pulmonary disease with (acute) exacerbation: Secondary | ICD-10-CM

## 2020-06-15 DIAGNOSIS — J9601 Acute respiratory failure with hypoxia: Secondary | ICD-10-CM

## 2020-06-15 LAB — BASIC METABOLIC PANEL
Anion gap: 6 (ref 5–15)
BUN: 50 mg/dL — ABNORMAL HIGH (ref 8–23)
CO2: 40 mmol/L — ABNORMAL HIGH (ref 22–32)
Calcium: 9.7 mg/dL (ref 8.9–10.3)
Chloride: 100 mmol/L (ref 98–111)
Creatinine, Ser: 1.1 mg/dL (ref 0.61–1.24)
GFR, Estimated: >60 mL/min (ref >60)
Glucose, Bld: 135 mg/dL — ABNORMAL HIGH (ref 70–99)
Potassium: 5.4 mmol/L — ABNORMAL HIGH (ref 3.5–5.1)
Sodium: 146 mmol/L — ABNORMAL HIGH (ref 135–145)

## 2020-06-15 LAB — GLUCOSE, CAPILLARY
Glucose-Capillary: 130 mg/dL — ABNORMAL HIGH (ref 70–99)
Glucose-Capillary: 133 mg/dL — ABNORMAL HIGH (ref 70–99)
Glucose-Capillary: 142 mg/dL — ABNORMAL HIGH (ref 70–99)
Glucose-Capillary: 143 mg/dL — ABNORMAL HIGH (ref 70–99)
Glucose-Capillary: 150 mg/dL — ABNORMAL HIGH (ref 70–99)
Glucose-Capillary: 159 mg/dL — ABNORMAL HIGH (ref 70–99)

## 2020-06-15 LAB — CBC WITH DIFFERENTIAL/PLATELET
Abs Immature Granulocytes: 0.04 10*3/uL (ref 0.00–0.07)
Basophils Absolute: 0 10*3/uL (ref 0.0–0.1)
Basophils Relative: 0 %
Eosinophils Absolute: 0 10*3/uL (ref 0.0–0.5)
Eosinophils Relative: 0 %
HCT: 39.7 % (ref 39.0–52.0)
Hemoglobin: 12.3 g/dL — ABNORMAL LOW (ref 13.0–17.0)
Immature Granulocytes: 0 %
Lymphocytes Relative: 6 %
Lymphs Abs: 0.7 10*3/uL (ref 0.7–4.0)
MCH: 30.1 pg (ref 26.0–34.0)
MCHC: 31 g/dL (ref 30.0–36.0)
MCV: 97.1 fL (ref 80.0–100.0)
Monocytes Absolute: 0.9 10*3/uL (ref 0.1–1.0)
Monocytes Relative: 7 %
Neutro Abs: 10.8 10*3/uL — ABNORMAL HIGH (ref 1.7–7.7)
Neutrophils Relative %: 87 %
Platelets: 180 10*3/uL (ref 150–400)
RBC: 4.09 MIL/uL — ABNORMAL LOW (ref 4.22–5.81)
RDW: 13.4 % (ref 11.5–15.5)
WBC: 12.5 10*3/uL — ABNORMAL HIGH (ref 4.0–10.5)
nRBC: 0 % (ref 0.0–0.2)

## 2020-06-15 LAB — MAGNESIUM: Magnesium: 2.7 mg/dL — ABNORMAL HIGH (ref 1.7–2.4)

## 2020-06-15 LAB — PHOSPHORUS: Phosphorus: 3.9 mg/dL (ref 2.5–4.6)

## 2020-06-15 MED ORDER — IPRATROPIUM-ALBUTEROL 0.5-2.5 (3) MG/3ML IN SOLN
3.0000 mL | RESPIRATORY_TRACT | Status: DC
  Administered 2020-06-15 – 2020-06-16 (×8): 3 mL via RESPIRATORY_TRACT
  Filled 2020-06-15 (×8): qty 3

## 2020-06-15 MED ORDER — METHYLPREDNISOLONE SODIUM SUCC 40 MG IJ SOLR
20.0000 mg | Freq: Two times a day (BID) | INTRAMUSCULAR | Status: DC
  Administered 2020-06-15 – 2020-06-16 (×3): 20 mg via INTRAVENOUS
  Filled 2020-06-15 (×3): qty 1

## 2020-06-15 MED ORDER — FREE WATER
100.0000 mL | Status: DC
  Administered 2020-06-15 – 2020-06-16 (×8): 100 mL

## 2020-06-15 NOTE — Progress Notes (Signed)
NAME:  Carlos Nelson, MRN:  [id number], DOB:  26-Dec-1950, LOS: 7 ADMISSION DATE:  06/13/2020  BRIEF SYNOPSIS  History of Present Illness:  70 yo M who presented to [facility] ED via EMS from the group home where he resides with c/o shortness of breath that started the same day (06/06/2020) & chest tightness. EMS administered nebulizer treatments & solu-medrol IV, upon reaching the ED the patient reported his breathing felt better. ED course: Patient received 3 breathing treatments & Rocephin IV with SpO2 improvement from mid 80's to 91% at rest on room air. SpO2 dropped to 88% with ambulation and TRH was consulted to admit the patient with COPD exacerbation. Initial Vitals: afebrile, tachypneic at 32, tachycardic at 109, BP: 110/78 (89) & SpO2 at 98% on 4 L [place]. Significant Labs: CXR clear, troponin negative, COVID-19 & Influenza A/B negative, no other abnormalities noted at admission.  Hospital course: Patient admitted to Med-Surgical floor for COPD exacerbation. Patient receiving bronchodilator therapy, mucolytics, steroids & Doxycycline. On 06/10/20 patient feeling more short of breath, with audible wheezing. ABG: 7.35/ 65/ 102/ 35.9. Solu-medrol IV increased to Q 6 h & BIPAP ordered QHS. Rapid response called later on in the evening due to increased WOB and continued SOB. Patient placed on BIPAP, repeat ABG: 7.28/ 81/ 127/ 38.1. Patient able to speak in complete sentences stating his breathing "feels better". Transferred to SDU for monitoring. Patient deteriorated after 4 hours on BIPAP with increased accessory muscle use, lethargy, tachycardia & hypotension. PCCM consulted for emergent intubation.    Significant Hospital Events: Including procedures, antibiotic start and stop dates in addition to other pertinent events    05/22/2020- Admit to Medical surgical floor by [facility] for COPD exacerbation  06/10/20- Deterioration requiring BIPAP and transfer to SDU, ultimately failing BIPAP requiring emergent  intubation & mechanical ventilation. Ct chest probable RLL pnuemonia  06/12/20- patient is mildy improved.  He is off vasopressor and is getting weaned on ventilator in preparation for SBT.   06/13/20-  Patient Liberated from MV on HFNC.  Patient placement to [facility] signed out - transferred to step down unit. SLP today.   06/14/20-patient reintubated with BIPAP failure, goal of care discussion with palliative in process.   6/1 remains on vent, severe resp failure     Micro Data:  COVID/INF A-B NEG MRSA PCR NEG  Antimicrobials:   Antibiotics Given (last 72 hours)    Date/Time Action Medication Dose   06/12/20 0912 Given   doxycycline (VIBRA-TABS) tablet 100 mg 100 mg   06/12/20 2136 Given   doxycycline (VIBRA-TABS) tablet 100 mg 100 mg   06/13/20 0841 Given   doxycycline (VIBRA-TABS) tablet 100 mg 100 mg       INTERVAL HISTORY Severe resp failure Remains on vent Severe end stage COPD Failed BIPAP-re intubated      Objective   Blood pressure 122/88, pulse 85, temperature 98.96 F (37.2 C), resp. rate (!) 23, height 5\' 4"  (1.626 m), weight 51.4 kg, SpO2 93 %.    Vent Mode: PRVC FiO2 (%):  [35 %-50 %] 35 % Set Rate:  [22 bmp] 22 bmp Vt Set:  [450 mL] 450 mL PEEP:  [5 cmH20] 5 cmH20 Plateau Pressure:  [0 cmH20] 0 cmH20   Intake/Output Summary (Last 24 hours) at 06/15/2020 0728 Last data filed at 06/15/2020 7782 Gross per 24 hour  Intake 1220.97 ml  Output 1240 ml  Net -19.03 ml   Filed Weights   05/25/2020 0028 06/14/20 1600 06/15/20 0500  Weight: 59.4 kg 53.3 kg 51.4 kg      REVIEW OF SYSTEMS  PATIENT IS UNABLE TO PROVIDE COMPLETE REVIEW OF SYSTEMS DUE TO SEVERE CRITICAL ILLNESS AND TOXIC METABOLIC ENCEPHALOPATHY   PHYSICAL EXAMINATION:  GENERAL:critically ill appearing, +resp distress HEAD: Normocephalic, atraumatic.  EYES: Pupils equal, round, reactive to light.  No scleral icterus.  MOUTH: Moist mucosal membrane. NECK: Supple. PULMONARY: +rhonchi,  +wheezing CARDIOVASCULAR: S1 and S2. Regular rate and rhythm. No murmurs, rubs, or gallops.  GASTROINTESTINAL: Soft, nontender, -distended. Positive bowel sounds.  MUSCULOSKELETAL: No swelling, clubbing, or edema.  NEUROLOGIC: obtunded SKIN:intact,warm,dry   Labs/imaging that I havepersonally reviewed  (right click and "Reselect all SmartList Selections" daily)       ASSESSMENT AND PLAN SYNOPSIS  70 yo AAM with severe end stage COPD with severe hypoxic and hypercapnic resp failure with failure to wean from vent due to severe COPD, needs VENT to survive, I anticipate prolonged ICU LOS   Severe ACUTE Hypoxic and Hypercapnic Respiratory Failure -continue Mechanical Ventilator support -continue Bronchodilator Therapy -Wean Fio2 and PEEP as tolerated -VAP/VENT bundle implementation    SEVERE COPD EXACERBATION -continue IV steroids as prescribed -continue NEB THERAPY as prescribed -morphine as needed -wean fio2 as needed and tolerated    CARDIAC ICU monitoring   NEUROLOGY Acute toxic metabolic encephalopathy, need for sedation Goal RASS -2 to -3   INFECTIOUS DISEASE -continue antibiotics as prescribed -follow up cultures  ENDO - ICU hypoglycemic\Hyperglycemia protocol -check FSBS per protocol   GI GI PROPHYLAXIS as indicated  NUTRITIONAL STATUS DIET-->TF's as tolerated Constipation protocol as indicated   ELECTROLYTES -follow labs as needed -replace as needed -pharmacy consultation and following   Best practice (right click and "Reselect all SmartList Selections" daily)  Diet:  NPO Pain/Anxiety/Delirium protocol (if indicated): Yes (RASS goal -1) VAP protocol (if indicated): Yes DVT prophylaxis: LMWH GI prophylaxis: PPI Glucose control:  SSI Yes Central venous access:  Will be placing due to vasopressor needs Arterial line:  N/A Foley:  Will be placing due to retention Mobility:  bed rest  PT consulted: N/A Last date of multidisciplinary  goals of care discussion 06/10/20 Code Status:  full code Disposition: ICU   Labs   CBC: Recent Labs  Lab 06/09/20 1741 06/10/20 2352 06/12/20 0418 06/14/20 0405 06/15/20 0549  WBC 15.3* 10.5 13.6* 13.5* 12.5*  NEUTROABS  --  9.8*  --   --  10.8*  HGB 15.0 13.5 12.3* 12.5* 12.3*  HCT 46.2 42.5 39.0 40.5 39.7  MCV 93.0 94.2 93.3 95.7 97.1  PLT 197 141* 161 200 001    Basic Metabolic Panel: Recent Labs  Lab 06/10/20 2352 06/12/20 0418 06/13/20 0406 06/13/20 1239 06/14/20 0405 06/15/20 0549  NA 134* 134* 139  --  142 146*  K 5.4* 5.2* 5.7* 5.4* 5.8* 5.4*  CL 92* 93* 96*  --  98 100  CO2 34* 34* 36*  --  38* 40*  GLUCOSE 134* 117* 113*  --  113* 135*  BUN 22 52* 51*  --  47* 50*  CREATININE 0.98 1.25* 0.90  --  1.09 1.10  CALCIUM 9.2 9.1 9.0  --  9.2 9.7  MG  --  2.5* 2.9*  --  2.7* 2.7*  PHOS  --  4.5 3.6  --  3.9 3.9   GFR: Estimated Creatinine Clearance: 44.8 mL/min (by C-G formula based on SCr of 1.1 mg/dL). Recent Labs  Lab 06/10/20 2352 06/12/20 0418 06/13/20 0406 06/14/20 0405 06/15/20 0549  PROCALCITON <0.10  0.20 0.19  --   --   WBC 10.5 13.6*  --  13.5* 12.5*    Liver Function Tests: Recent Labs  Lab 06/10/20 2352 06/12/20 0418  AST 50* 27  ALT 50* 39  ALKPHOS 64 53  BILITOT 0.8 0.7  PROT 6.1* 5.8*  ALBUMIN 3.6 3.3*   No results for input(s): LIPASE, AMYLASE in the last 168 hours. No results for input(s): AMMONIA in the last 168 hours.  ABG    Component Value Date/Time   PHART 7.39 06/13/2020 1811   PCO2ART 76 (HH) 06/13/2020 1811   PO2ART 67 (L) 06/13/2020 1811   HCO3 46.0 (H) 06/13/2020 1811   O2SAT 92.8 06/13/2020 1811     Coagulation Profile: No results for input(s): INR, PROTIME in the last 168 hours.  Cardiac Enzymes: No results for input(s): CKTOTAL, CKMB, CKMBINDEX, TROPONINI in the last 168 hours.  HbA1C: Hemoglobin A1C  Date/Time Value Ref Range Status  11/17/2013 06:18 AM 5.5 4.2 - 6.3 % Final    Comment:    The  American Diabetes Association recommends that a primary goal of therapy should be <7% and that physicians should reevaluate the treatment regimen in patients with HbA1c values consistently >8%.    Hgb A1c MFr Bld  Date/Time Value Ref Range Status  05/22/2020 04:10 AM 5.9 (H) 4.8 - 5.6 % Final    Comment:    (NOTE) Pre diabetes:          5.7%-6.4%  Diabetes:              >6.4%  Glycemic control for   <7.0% adults with diabetes   01/19/2020 05:07 AM 5.2 4.8 - 5.6 % Final    Comment:    (NOTE) Pre diabetes:          5.7%-6.4%  Diabetes:              >6.4%  Glycemic control for   <7.0% adults with diabetes     CBG: Recent Labs  Lab 06/14/20 1107 06/14/20 1526 06/14/20 1958 06/14/20 2307 06/15/20 0311  GLUCAP 129* 156* 130* 146* 142*    Allergies Allergies  Allergen Reactions  . Ace Inhibitors Swelling    Ok with benazapril, per grouphome       DVT/GI PRX  assessed I Assessed the need for Labs I Assessed the need for Foley I Assessed the need for Central Venous Line Family Discussion when available I Assessed the need for Mobilization I made an Assessment of medications to be adjusted accordingly Safety Risk assessment completed  CASE DISCUSSED IN MULTIDISCIPLINARY ROUNDS WITH ICU TEAM     Critical Care Time devoted to patient care services described in this note is 60 minutes.  Critical care was necessary to treat or prevent imminent or life-threatening deterioration. Overall, patient is critically ill, prognosis is guarded.  Patient with Multiorgan failure and at high risk for cardiac arrest and death.    Corrin [place], M.D.  [facility]  Medical Director [place] Director [facility] Cardio-Pulmonary Department

## 2020-06-15 NOTE — Progress Notes (Signed)
[place] [facility]) Hospital Liaison note:  This patient is currently enrolled in [facility] outpatient-based Palliative Care. Will continue to follow for disposition. Was able to obtain sister Brenda's current contact information from the group home and gave to Maudie Mercury, [place] in ICU.  Please call with any outpatient palliative questions or concerns.  Thank you, Lorelee Market, LPN [facility] Hospital Liaison [phone]

## 2020-06-15 NOTE — TOC Progression Note (Signed)
Transition of Care [facility]) - Progression Note    Patient Details  Name: Carlos Nelson MRN: [id number] Date of Birth: [birth year]  Transition of Care [facility]) CM/SW [place], [place] Phone Number:  [phone] 06/15/2020, 2:20 PM  Clinical Narrative:     CSW spoke with patient's sister Dondre Catalfamo [phone], and updated on patient status and location.  CSW stated I would request Attending contact Ms. Harrower to discuss patient condition.  Ms. Dibello stated the patient has three other siblings and does not have surviving children.  CSW updated Attending w/ Ms. Linenberger's contact information.   Expected Discharge Plan: Group Home Barriers to Discharge: Continued Medical Work up  Expected Discharge Plan and Services Expected Discharge Plan: Group Home       Living arrangements for the past 2 months: Group Home                                       Social Determinants of Health (SDOH) Interventions    Readmission Risk Interventions Readmission Risk Prevention Plan 06/08/2020  Transportation Screening Complete  Medication Review (RN Care Manager) Complete  PCP or Specialist appointment within 3-5 days of discharge Complete  SW Recovery Care/Counseling Consult Complete  [place] Not Applicable  Some recent data might be hidden

## 2020-06-15 NOTE — Progress Notes (Deleted)
GOALS OF CARE DISCUSSION  The Clinical status was relayed to family in detail. Father and Girlfriend  Updated and notified of patients medical condition.    Patient remains unresponsive and will not open eyes to command.   Patient is having a weak cough and struggling to remove secretions.   Patient with increased WOB and using accessory muscles to breathe Explained to family course of therapy and the modalities    Patient with Progressive multiorgan failure with a very high probablity of a very minimal chance of meaningful recovery despite all aggressive and optimal medical therapy.  PATIENT REMAINS FULL CODE  Continue Vent support Continue PRessors Recommend DNR status...but remains full code  Family are satisfied with Plan of action and management. All questions answered  Additional CC time 25 mins   Sundeep Destin Patricia Pesa, M.D.  [facility]  Medical Director [place] Director [facility] Cardio-Pulmonary Department

## 2020-06-15 NOTE — Plan of Care (Signed)
PMT note:  Patient is on a ventilator. No family at bedside. Staff has not been able to reach patient's sister, and I have not been able to reach her. Unable to leave a VM. Staff is working toward reaching out to APS. Will shadow for a decision maker.

## 2020-06-15 NOTE — Plan of Care (Signed)
  Problem: Health Behavior/Discharge Planning: Goal: Ability to manage health-related needs will improve Outcome: Not Progressing   Problem: Clinical Measurements: Goal: Ability to maintain clinical measurements within normal limits will improve Outcome: Not Progressing Goal: Will remain free from infection Outcome: Not Progressing Goal: Diagnostic test results will improve Outcome: Not Progressing Goal: Respiratory complications will improve Outcome: Not Progressing Goal: Cardiovascular complication will be avoided Outcome: Not Progressing   Problem: Activity: Goal: Risk for activity intolerance will decrease Outcome: Not Progressing   Problem: Nutrition: Goal: Adequate nutrition will be maintained Outcome: Not Progressing   Problem: Elimination: Goal: Will not experience complications related to bowel motility Outcome: Not Progressing Goal: Will not experience complications related to urinary retention Outcome: Not Progressing   Problem: Safety: Goal: Ability to remain free from injury will improve Outcome: Not Progressing

## 2020-06-15 NOTE — Progress Notes (Signed)
GOALS OF CARE DISCUSSION  The Clinical status was relayed to family in detail. Sister [name]   Updated and notified of patients medical condition.    Patient remains unresponsive and will not open eyes to command.   Patient is having a weak cough and struggling to remove secretions.   Patient with increased WOB and using accessory muscles to breathe Explained to family course of therapy and the modalities    Patient with Progressive multiorgan failure with a very high probablity of a very minimal chance of meaningful recovery despite all aggressive and optimal medical therapy.   The Sister states that Tonny [place] has severe physiatric illness and he would NOT want to live on machines She has consented and agreed to DNR status  Plan for continued care, plan is top wait for [name] to talk with family and they will decide on next steps and plan of action  Family are satisfied with Plan of action and management. All questions answered  Additional CC time 35 mins   Beren Yniguez Patricia Pesa, M.D.  [facility]  Medical Director [place] Director [facility] Cardio-Pulmonary Department

## 2020-06-15 DEATH — deceased

## 2020-06-16 DIAGNOSIS — Z7189 Other specified counseling: Secondary | ICD-10-CM | POA: Diagnosis not present

## 2020-06-16 DIAGNOSIS — Z515 Encounter for palliative care: Secondary | ICD-10-CM | POA: Diagnosis not present

## 2020-06-16 DIAGNOSIS — J9601 Acute respiratory failure with hypoxia: Secondary | ICD-10-CM | POA: Diagnosis not present

## 2020-06-16 DIAGNOSIS — J441 Chronic obstructive pulmonary disease with (acute) exacerbation: Secondary | ICD-10-CM | POA: Diagnosis not present

## 2020-06-16 LAB — GLUCOSE, CAPILLARY
Glucose-Capillary: 148 mg/dL — ABNORMAL HIGH (ref 70–99)
Glucose-Capillary: 134 mg/dL — ABNORMAL HIGH (ref 70–99)
Glucose-Capillary: 160 mg/dL — ABNORMAL HIGH (ref 70–99)
Glucose-Capillary: 139 mg/dL — ABNORMAL HIGH (ref 70–99)

## 2020-06-16 MED ORDER — MIDAZOLAM HCL 2 MG/2ML IJ SOLN
2.0000 mg | INTRAMUSCULAR | Status: DC | PRN
  Administered 2020-06-16: 2 mg via INTRAVENOUS
  Filled 2020-06-16: qty 2

## 2020-06-16 MED ORDER — POLYVINYL ALCOHOL 1.4 % OP SOLN
1.0000 [drp] | Freq: Four times a day (QID) | OPHTHALMIC | Status: DC | PRN
  Filled 2020-06-16: qty 15

## 2020-06-16 MED ORDER — GLYCOPYRROLATE 1 MG PO TABS
1.0000 mg | ORAL_TABLET | ORAL | Status: DC | PRN
  Filled 2020-06-16: qty 1

## 2020-06-16 MED ORDER — DIPHENHYDRAMINE HCL 50 MG/ML IJ SOLN
25.0000 mg | INTRAMUSCULAR | Status: DC | PRN
Start: 2020-06-16 — End: 2020-06-17

## 2020-06-16 MED ORDER — MORPHINE 100MG IN NS 100ML (1MG/ML) PREMIX INFUSION
0.0000 mg/h | INTRAVENOUS | Status: DC
  Administered 2020-06-16: 5 mg/h via INTRAVENOUS
  Filled 2020-06-16: qty 100

## 2020-06-16 MED ORDER — ACETAMINOPHEN 650 MG RE SUPP: 650.0000 mg | Freq: Four times a day (QID) | RECTAL | Status: DC | PRN

## 2020-06-16 MED ORDER — GLYCOPYRROLATE 0.2 MG/ML IJ SOLN: 0.2000 mg | INTRAMUSCULAR | Status: DC | PRN

## 2020-06-16 MED ORDER — MORPHINE SULFATE (PF) 2 MG/ML IV SOLN: 2.0000 mg | INTRAVENOUS | Status: DC | PRN

## 2020-06-16 MED ORDER — DEXTROSE 5 % IV SOLN: INTRAVENOUS | Status: DC

## 2020-06-16 MED ORDER — ACETAMINOPHEN 325 MG PO TABS: 650.0000 mg | ORAL_TABLET | Freq: Four times a day (QID) | ORAL | Status: DC | PRN

## 2020-06-16 MED ORDER — MORPHINE BOLUS VIA INFUSION
5.0000 mg | INTRAVENOUS | Status: DC | PRN
  Filled 2020-06-16: qty 5

## 2020-06-16 MED ORDER — MORPHINE 100MG IN NS 100ML (1MG/ML) PREMIX INFUSION
0.0000 mg/h | INTRAVENOUS | Status: DC
  Administered 2020-06-17: 12 mg/h via INTRAVENOUS
  Filled 2020-06-16: qty 100

## 2020-06-16 MED ORDER — GLYCOPYRROLATE 0.2 MG/ML IJ SOLN
0.2000 mg | INTRAMUSCULAR | Status: DC | PRN
  Administered 2020-06-16: 0.2 mg via INTRAVENOUS
  Filled 2020-06-16: qty 1

## 2020-06-16 NOTE — Progress Notes (Signed)
   06/16/20 1835  Clinical Encounter Type  Visited With Patient and family together  Visit Type Initial;Spiritual support  Referral From Nurse  Consult/Referral To Chaplain  Spiritual Encounters  Spiritual Needs Prayer;Emotional  Chaplain Autumn Patty responded to a page in [place], [place]. Family by Pt's bedside and the clinical status was conveyed to the family in detail. Pt is unresponsive and will not open eyes to commands.Pt is on [place] and nurse is prepping Pt for an extubation. Family understands the situation.

## 2020-06-16 NOTE — Consult Note (Addendum)
Consultation Note Date: 06/16/2020   Patient Name: Carlos Nelson  DOB: 08-04-1950  MRN: [id number]  Age / Sex: 70 y.o., male  PCP: [facility] Referring Physician: Flora Lipps, MD  Reason for Consultation: Establishing goals of care  HPI/Patient Profile: 70 yo M who presented to [facility] ED via EMS from the group home where he resides with c/o shortness of breath that started the same day (06/08/2020) & chest tightness. EMS administered nebulizer treatments & solu-medrol IV, upon reaching the ED the patient reported his breathing felt better.  Clinical Assessment and Goals of Care: Patient is resting in bed with no family at bedside. Per staff, they have been able ot speak with sister [name]. I called [name] at the new number listed. She states the number we had been calling to try to reach her (which is now listed as her home phone number)  is a phone number she no longer has. This number was removed from demographics.  She states she knew he was in the hospital, but states the group home administrator had advised she had lost Brenda's updated contact information.    [name] is able to articulate his status. She states she has already spoken with CCM, and a plan is in place for she and her family to come to liberate him from ventilator support and focus on comfort. Discussed comfort care and anticipation of hospital death. Questions were answered. She states she will be able to come this evening around 6:00pm.      Henderson states family will be here at 6:00 this evening to liberate him from ventilator support.   Anticipate hospital death.    Prognosis:  Hours - Days  Without ventilator support.       Primary Diagnoses: Present on Admission: . COPD exacerbation ([place]) . COPD with acute exacerbation ([place])   I have reviewed the medical record, interviewed the  patient and family, and examined the patient. The following aspects are pertinent.  Past Medical History:  Diagnosis Date  . Chronic respiratory failure with hypoxia ([place])   . COPD (chronic obstructive pulmonary disease) ([place])   . Hypertension   . Schizophrenia [facility])    Social History   Socioeconomic History  . Marital status: Single    Spouse name: Not on file  . Number of children: Not on file  . Years of education: Not on file  . Highest education level: Not on file  Occupational History  . Not on file  Tobacco Use  . Smoking status: Current Every Day Smoker    Packs/day: 1.00    Types: Cigarettes  . Smokeless tobacco: Never Used  Vaping Use  . Vaping Use: Never used  Substance and Sexual Activity  . Alcohol use: Yes    Alcohol/week: 0.0 standard drinks    Comment: beer  . Drug use: Not Currently    Types: Marijuana  . Sexual activity: Not Currently  Other Topics Concern  . Not on file  Social History Narrative  Lives in [place]   Social Determinants of Health   Financial Resource Strain: Not on file  Food Insecurity: Not on file  Transportation Needs: Not on file  Physical Activity: Not on file  Stress: Not on file  Social Connections: Not on file   Family History  Problem Relation Age of Onset  . Prostate cancer Neg Hx   . Bladder Cancer Neg Hx    Scheduled Meds: . ARIPiprazole  30 mg Per Tube Daily  . aspirin  81 mg Per Tube Daily  . budesonide (PULMICORT) nebulizer solution  0.25 mg Nebulization BID  . chlorhexidine gluconate (MEDLINE KIT)  15 mL Mouth Rinse BID  . Chlorhexidine Gluconate Cloth  6 each Topical Daily  . docusate  100 mg Per Tube BID  . donepezil  5 mg Per Tube QHS  . enoxaparin (LOVENOX) injection  40 mg Subcutaneous Q24H  . feeding supplement (NEPRO CARB STEADY)  1,000 mL Per Tube Q24H  . free water  100 mL Per Tube Q4H  . insulin aspart  0-9 Units Subcutaneous Q4H  . ipratropium-albuterol  3 mL Nebulization Q4H  . mouth  rinse  15 mL Mouth Rinse 10 times per day  . methylPREDNISolone (SOLU-MEDROL) injection  20 mg Intravenous Q12H  . polyethylene glycol  17 g Per Tube Daily  . sodium zirconium cyclosilicate  10 g Per Tube Daily   Continuous Infusions: . dexmedetomidine (PRECEDEX) IV infusion 1 mcg/kg/hr (06/16/20 1129)  . fentaNYL infusion INTRAVENOUS 145 mcg/hr (06/16/20 1129)   PRN Meds:.acetaminophen **OR** acetaminophen, fentaNYL, metoprolol tartrate, nitroGLYCERIN, traZODone Medications Prior to Admission:  Prior to Admission medications   Medication Sig Start Date End Date Taking? Authorizing Provider  albuterol (VENTOLIN HFA) 108 (90 Base) MCG/ACT inhaler Inhale 2 by mouth every 4 hours as needed for wheezing, cough, and/or shortness of breath 05/23/20  Yes Rai, Ripudeep K, MD  amlodipine-olmesartan (AZOR) 10-20 MG tablet Take 1 tablet by mouth daily. 03/23/20  Yes Loel Dubonnet, NP  ARIPiprazole (ABILIFY) 30 MG tablet Take 30 mg by mouth daily. 01/29/20  Yes [provider]  aspirin EC 81 MG EC tablet Take 1 tablet (81 mg total) by mouth daily. Swallow whole. 05/24/20  Yes Rai, Ripudeep K, MD  dextromethorphan-guaiFENesin (MUCINEX DM) 30-600 MG 12hr tablet Take 1 tablet by mouth 2 (two) times daily as needed for cough. 05/23/20  Yes Rai, Ripudeep K, MD  donepezil (ARICEPT) 5 MG tablet Take 5 mg by mouth at bedtime.   Yes [provider]  mometasone-formoterol (DULERA) 100-5 MCG/ACT AERO Inhale 2 puffs into the lungs 2 (two) times daily. 05/23/20  Yes Rai, Ripudeep K, MD  pantoprazole (PROTONIX) 20 MG tablet Take 1 tablet (20 mg total) by mouth daily. 12/18/19 12/17/20 Yes Lavonia Drafts, MD  QUEtiapine (SEROQUEL) 200 MG tablet Take 3 tablets (600 mg total) by mouth at bedtime. 02/03/20  Yes Harvest Dark, MD  traZODone (DESYREL) 100 MG tablet Take 100 mg by mouth at bedtime. 02/24/20  Yes [provider]  ipratropium-albuterol (DUONEB) 0.5-2.5 (3) MG/3ML SOLN Take 3 mLs by  nebulization every 4 (four) hours for 14 days. Patient taking differently: Take 3 mLs by nebulization every 4 (four) hours as needed. 03/11/20 03/25/20  Fritzi Mandes, MD  nitroGLYCERIN (NITROSTAT) 0.4 MG SL tablet Place 1 tablet (0.4 mg total) under the tongue every 5 (five) minutes as needed for chest pain. 05/23/20   Rai, Vernelle Emerald, MD  predniSONE (DELTASONE) 10 MG tablet Prednisone dosing: Take  Prednisone  77m (4 tabs) x 3 days, then taper to 31m(3 tabs) x 3 days, then 2035m2 tabs) x 3days, then 46m44m tab) x 3days, then OFF. Patient not taking: Reported on 05/31/2020 05/23/20   Rai,Mendel Corning   Allergies  Allergen Reactions  . Ace Inhibitors Swelling    Ok with benazapril, per grouphome   Review of Systems  Unable to perform ROS   Physical Exam Constitutional:      Comments: Eyes closed. On ventilator.      Vital Signs: BP 107/72   Pulse 80   Temp 99.5 F (37.5 C) (Esophageal)   Resp (!) 22   Ht 5' 4"  (1.626 m)   Wt 48.8 kg   SpO2 92%   BMI 18.47 kg/m  Pain Scale: CPOT POSS *See Group Information*: 2-Acceptable,Slightly drowsy, easily aroused Pain Score: 0-No pain   SpO2: SpO2: 92 % O2 Device:SpO2: 92 % O2 Flow Rate: .O2 Flow Rate (L/min): 50 L/min  IO: Intake/output summary:   Intake/Output Summary (Last 24 hours) at 06/16/2020 1336 Last data filed at 06/16/2020 1136 Gross per 24 hour  Intake 2125.02 ml  Output 1785 ml  Net 340.02 ml    LBM: Last BM Date: 06/15/20 Baseline Weight: Weight: 59.4 kg Most recent weight: Weight: 48.8 kg       Time In: 1:10 Time Out: 1:45 Time Total: 35 min Greater than 50%  of this time was spent counseling and coordinating care related to the above assessment and plan.  Signed by: CrysAsencion Gowda   Please contact Palliative Medicine Team phone at [phone] questions and concerns.  For individual provider: See AmioShea Evans

## 2020-06-16 NOTE — Progress Notes (Signed)
Received report and assumed pt care. Family present at the bedside. Patient has eyes open but is not following any commands at this time. Patient coughing and gagging on ETT. Sedation increased. Excess secretions in ETT and oral cavity. Patient repositioned in bed. Will continue to monitor.

## 2020-06-16 NOTE — Progress Notes (Signed)
Accepted PCCM transfer.  Patient transitioning to comfort care due to end-stage COPD this evening.  TRH will assume attending role on Jul 12, 2020 at 7 AM

## 2020-06-16 NOTE — Progress Notes (Signed)
Chaplin at bedside with family

## 2020-06-16 NOTE — Progress Notes (Signed)
Family is at bedside, patient is verbally talking to them. Patient appears to be comfortable. Family confirms that patient is comfortable and will call if anything is needed. Chaplin remains at bedside.

## 2020-06-16 NOTE — Progress Notes (Signed)
Patient is alert, talking with family and requesting water. Benjamine Mola, NP notified of patients request, Dr.Kasa was then notified and verified that patient is DNR and ok to give patient comfort fluids and foods

## 2020-06-16 NOTE — Progress Notes (Signed)
Patient sister [name] called for an update. Advised that patient condition remains the same. No concerns or questions voiced.

## 2020-06-16 NOTE — Progress Notes (Signed)
Pt RASS is -1 to +1 this shift. PRN fentanyl bolus for +CPOT/ agitation. Able to follow commands but will not grip hands. Will raise arms/legs and stick out tongue on command. Foley has adequate output. OG intact and appears to be tolerating tube feeds this shift. Does not appear to be in any distress at this time.  Will continue to monitor.

## 2020-06-16 NOTE — Progress Notes (Signed)
Family in waiting room. RT at bedside, Cuff deflated and patient extubated. Oral care provided. Patient in no apparent distress. Family called back to bedside. Chaplin offered and agrees for chaplin to be at bedside. Chaplin notified.

## 2020-06-16 NOTE — Progress Notes (Signed)
Sister, Carlos Nelson and niece, Carlos Nelson have left for the evening. Numbers verified and updated in chart.

## 2020-06-16 NOTE — Progress Notes (Signed)
NAME:  Carlos Nelson, MRN:  [id number], DOB:  01/28/1950, LOS: 57 ADMISSION DATE:  06/02/2020 70 yo M who presented to [facility] ED via EMS from the group home where he resides with c/o shortness of breath that started the same day (05/17/2020) & chest tightness. EMS administered nebulizer treatments & solu-medrol IV, upon reaching the ED the patient reported his breathing felt better. ED course:Patient received 3 breathing treatments &Rocephin IV with SpO2 improvement from mid 80's to 91% at rest on room air. SpO2 dropped to 88% with ambulation and TRH was consulted to admit the patient with COPD exacerbation. Initial Vitals: afebrile, tachypneic at 32, tachycardic at 109, BP: 110/78 (89) & SpO2 at 98% on 4 L [place]. Significant Labs: CXR clear, troponin negative, COVID-19 & Influenza A/B negative, no other abnormalities noted at admission.  Hospital course: Patient admitted to Med-Surgical floor for COPD exacerbation. Patient receiving bronchodilator therapy, mucolytics, steroids & Doxycycline. On 06/10/20 patient feeling more short of breath, with audible wheezing. ABG: 7.35/ 65/ 102/ 35.9. Solu-medrol IV increased to Q 6 h & BIPAP ordered QHS. Rapid response called later on in the evening due to increased WOB and continued SOB. Patient placed on BIPAP, repeat ABG: 7.28/ 81/ 127/ 38.1. Patient able to speak in complete sentences stating his breathing "feels better". Transferred to SDU for monitoring. Patient deteriorated after 4 hours on BIPAP with increased accessory muscle use, lethargy, tachycardia & hypotension. PCCM consulted for emergent intubation.    Significant Hospital Events: Including procedures, antibiotic start and stop dates in addition to other pertinent events    05/29/2020- Admit to Medical surgical floor by [facility] for COPD exacerbation  06/10/20- Deterioration requiring BIPAP and transfer to SDU, ultimately failing BIPAP requiring emergent intubation & mechanical ventilation. Ct chest  probable RLL pnuemonia  06/12/20-patient is mildy improved. He is off vasopressor and is getting weaned on ventilator in preparation for SBT.   06/13/20- Patient Liberated from MV on HFNC. Patient placement to [facility] signed out - transferred to step down unit. SLP today.   06/14/20-patient reintubated with BIPAP failure, goal of care discussion with palliative in process.   6/1 remains on vent, severe resp failure     Micro Data:  COVID/INF A-B NEG MRSA PCR NEG  Antimicrobials:          Antibiotics Given (last 72 hours)    Date/Time Action Medication Dose   06/12/20 0912 Given   doxycycline (VIBRA-TABS) tablet 100 mg 100 mg   06/12/20 2136 Given   doxycycline (VIBRA-TABS) tablet 100 mg 100 mg   06/13/20 0841 Given   doxycycline (VIBRA-TABS) tablet 100 mg 100 mg            Interim History / Subjective:  Remains on vent End stage COPD Failed biPAP  Patient made DNR status        Objective   Blood pressure 104/70, pulse 78, temperature (!) 96.26 F (35.7 C), resp. rate (!) 22, height 5\' 4"  (1.626 m), weight 48.8 kg, SpO2 92 %.    Vent Mode: PRVC FiO2 (%):  [35 %] 35 % Set Rate:  [22 bmp] 22 bmp Vt Set:  [450 mL] 450 mL PEEP:  [5 cmH20] 5 cmH20 Plateau Pressure:  [0 cmH20-18 cmH20] 18 cmH20   Intake/Output Summary (Last 24 hours) at 06/16/2020 0856 Last data filed at 06/16/2020 0823 Gross per 24 hour  Intake 2125.37 ml  Output 1855 ml  Net 270.37 ml   Filed Weights   06/14/20 1600 06/15/20 0500  06/16/20 0438  Weight: 53.3 kg 51.4 kg 48.8 kg      REVIEW OF SYSTEMS  PATIENT IS UNABLE TO PROVIDE COMPLETE REVIEW OF SYSTEMS DUE TO SEVERE CRITICAL ILLNESS AND TOXIC METABOLIC ENCEPHALOPATHY   PHYSICAL EXAMINATION:  GENERAL:critically ill appearing, +resp distress HEAD: Normocephalic, atraumatic.  EYES: Pupils equal, round, reactive to light.  No scleral icterus.  MOUTH: Moist mucosal membrane. NECK: Supple. PULMONARY: +rhonchi,  +wheezing CARDIOVASCULAR: S1 and S2. Regular rate and rhythm. No murmurs, rubs, or gallops.  GASTROINTESTINAL: Soft, nontender, -distended. Positive bowel sounds.  MUSCULOSKELETAL: No swelling, clubbing, or edema.  NEUROLOGIC: obtunded SKIN:intact,warm,dry   Labs/imaging that I havepersonally reviewed  (right click and "Reselect all SmartList Selections" daily)      ASSESSMENT AND PLAN SYNOPSIS    70 yo AAM with severe end stage COPD with severe hypoxic and hypercapnic resp failure with failure to wean from vent due to severe COPD, needs VENT to survive, I anticipate prolonged ICU LOS  Severe ACUTE Hypoxic and Hypercapnic Respiratory Failure -continue Mechanical Ventilator support -continue Bronchodilator Therapy -Wean Fio2 and PEEP as tolerated -VAP/VENT bundle implementation Unable to wean from vent Patient will need artifical support to stay alive   SEVERE COPD EXACERBATION -continue IV steroids as prescribed -continue NEB THERAPY as prescribed  CARDIAC ICU monitoring    NEUROLOGY Acute toxic metabolic encephalopathy, need for sedation Goal RASS -2 to -3   INFECTIOUS DISEASE -continue antibiotics as prescribed -follow up cultures   ENDO - ICU hypoglycemic\Hyperglycemia protocol -check FSBS per protocol   GI GI PROPHYLAXIS as indicated  NUTRITIONAL STATUS DIET-->TF's as tolerated Constipation protocol as indicated   ELECTROLYTES -follow labs as needed -replace as needed -pharmacy consultation and following     Best practice (right click and "Reselect all SmartList Selections" daily)  Diet: NPO Pain/Anxiety/Delirium protocol (if indicated): Yes (RASS goal -1) VAP protocol (if indicated): Yes DVT prophylaxis: LMWH GI prophylaxis: PPI Glucose control: SSI Yes Central venous access: Will be placing due to vasopressor needs Arterial line: N/A Foley: Will be placing due to retention Mobility: bed rest PT consulted: N/A Last date of  multidisciplinary goals of care discussion5/27/22 Code Status: full code Disposition:ICU    Labs   CBC: Recent Labs  Lab 06/09/20 1741 06/10/20 2352 06/12/20 0418 06/14/20 0405 06/15/20 0549  WBC 15.3* 10.5 13.6* 13.5* 12.5*  NEUTROABS  --  9.8*  --   --  10.8*  HGB 15.0 13.5 12.3* 12.5* 12.3*  HCT 46.2 42.5 39.0 40.5 39.7  MCV 93.0 94.2 93.3 95.7 97.1  PLT 197 141* 161 200 867    Basic Metabolic Panel: Recent Labs  Lab 06/10/20 2352 06/12/20 0418 06/13/20 0406 06/13/20 1239 06/14/20 0405 06/15/20 0549  NA 134* 134* 139  --  142 146*  K 5.4* 5.2* 5.7* 5.4* 5.8* 5.4*  CL 92* 93* 96*  --  98 100  CO2 34* 34* 36*  --  38* 40*  GLUCOSE 134* 117* 113*  --  113* 135*  BUN 22 52* 51*  --  47* 50*  CREATININE 0.98 1.25* 0.90  --  1.09 1.10  CALCIUM 9.2 9.1 9.0  --  9.2 9.7  MG  --  2.5* 2.9*  --  2.7* 2.7*  PHOS  --  4.5 3.6  --  3.9 3.9   GFR: Estimated Creatinine Clearance: 42.5 mL/min (by C-G formula based on SCr of 1.1 mg/dL). Recent Labs  Lab 06/10/20 2352 06/12/20 0418 06/13/20 0406 06/14/20 0405 06/15/20 0549  PROCALCITON <0.10 0.20  0.19  --   --   WBC 10.5 13.6*  --  13.5* 12.5*    Liver Function Tests: Recent Labs  Lab 06/10/20 2352 06/12/20 0418  AST 50* 27  ALT 50* 39  ALKPHOS 64 53  BILITOT 0.8 0.7  PROT 6.1* 5.8*  ALBUMIN 3.6 3.3*   No results for input(s): LIPASE, AMYLASE in the last 168 hours. No results for input(s): AMMONIA in the last 168 hours.  ABG    Component Value Date/Time   PHART 7.39 06/13/2020 1811   PCO2ART 76 (HH) 06/13/2020 1811   PO2ART 67 (L) 06/13/2020 1811   HCO3 46.0 (H) 06/13/2020 1811   O2SAT 92.8 06/13/2020 1811     Coagulation Profile: No results for input(s): INR, PROTIME in the last 168 hours.  Cardiac Enzymes: No results for input(s): CKTOTAL, CKMB, CKMBINDEX, TROPONINI in the last 168 hours.  HbA1C: Hemoglobin A1C  Date/Time Value Ref Range Status  11/17/2013 06:18 AM 5.5 4.2 - 6.3 % Final     Comment:    The American Diabetes Association recommends that a primary goal of therapy should be <7% and that physicians should reevaluate the treatment regimen in patients with HbA1c values consistently >8%.    Hgb A1c MFr Bld  Date/Time Value Ref Range Status  05/22/2020 04:10 AM 5.9 (H) 4.8 - 5.6 % Final    Comment:    (NOTE) Pre diabetes:          5.7%-6.4%  Diabetes:              >6.4%  Glycemic control for   <7.0% adults with diabetes   01/19/2020 05:07 AM 5.2 4.8 - 5.6 % Final    Comment:    (NOTE) Pre diabetes:          5.7%-6.4%  Diabetes:              >6.4%  Glycemic control for   <7.0% adults with diabetes     CBG: Recent Labs  Lab 06/15/20 1551 06/15/20 2013 06/15/20 2341 06/16/20 0430 06/16/20 0736  GLUCAP 133* 143* 159* 148* 134*    Allergies Allergies  Allergen Reactions  . Ace Inhibitors Swelling    Ok with benazapril, per grouphome       DVT/GI PRX  assessed I Assessed the need for Labs I Assessed the need for Foley I Assessed the need for Central Venous Line Family Discussion when available I Assessed the need for Mobilization I made an Assessment of medications to be adjusted accordingly Safety Risk assessment completed  CASE DISCUSSED IN MULTIDISCIPLINARY ROUNDS WITH ICU TEAM    Critical Care Time devoted to patient care services described in this note is 55  minutes.  Critical care was necessary to treat or prevent imminent or life-threatening deterioration. Overall, patient is critically ill, prognosis is guarded.  Patient with Multiorgan failure and at high risk for cardiac arrest and death.    Corrin Parker, M.D.  [facility]  Medical Director [place] Director [facility] Cardio-Pulmonary Department

## 2020-06-16 NOTE — Progress Notes (Signed)
Family at bedside reminiscing on moments with pt. Pt appears comfortable. MD spoke to family at bedside. Morphine gtt started. RN will give report to next shift RN prior to further comfort measures and extubation so that pt care is not interrupted by shift change.

## 2020-06-16 NOTE — Progress Notes (Signed)
GOALS OF CARE DISCUSSION  The Clinical status was relayed to family in detail. All family members at bedside  Updated and notified of patients medical condition.    Patient remains unresponsive and will not open eyes to command.   Patient is having a weak cough and struggling to remove secretions.   Patient with increased WOB and using accessory muscles to breathe Explained to family course of therapy and the modalities    Patient with Progressive multiorgan failure with a very high probablity of a very minimal chance of meaningful recovery despite all aggressive and optimal medical therapy.    Family understands the situation.  They have consented and agreed to DNR/DNI and would like to proceed with Comfort care measures.  Family are satisfied with Plan of action and management. All questions answered  Additional CC time 35 mins   Aren Cherne Patricia Pesa, M.D.  [facility]  Medical Director [place] Director [facility] Cardio-Pulmonary Department

## 2020-06-20 LAB — BLOOD GAS, ARTERIAL
Acid-Base Excess: 7.8 mmol/L — ABNORMAL HIGH (ref 0.0–2.0)
Bicarbonate: 38.1 mmol/L — ABNORMAL HIGH (ref 20.0–28.0)
FIO2: 0.5
Mode: POSITIVE
O2 Saturation: 98.5 %
Patient temperature: 37
pH, Arterial: 7.28 — ABNORMAL LOW (ref 7.350–7.450)
pO2, Arterial: 127 mmHg — ABNORMAL HIGH (ref 83.0–108.0)

## 2020-07-15 NOTE — Progress Notes (Signed)
Attempted to notify patients sister, Saliou Barnier that patient has passed. No answer and per previous conversation with [name], ok to call Tiki Rudd and notify her if patient should pass this shift.

## 2020-07-15 NOTE — Progress Notes (Signed)
Patient has expired. Patient is without breath sounds, no pulse and pupils fixed. Verified by 2nd RN, Maryjean Morn, RN. Agricultural consultant, AD and Rufina Falco, NP notified of patients passing.

## 2020-07-15 NOTE — Death Summary Note (Signed)
DEATH SUMMARY   Patient Details  Name: Carlos Nelson MRN: [id number] DOB: Sep 29, 1950  Admission/Discharge Information   Admit Date:  06-11-2020  Date of Death: Date of Death: 06/21/20  Time of Death: Time of Death: 0325  Length of Stay: 03/28/2022  Referring Physician: [facility]   Reason(s) for Hospitalization  Acute hypoxic hypercapnic respiratory failure due to AECOPD  Diagnoses  Preliminary cause of death:  Secondary Diagnoses (including complications and co-morbidities):  Active Problems:   Essential hypertension   COPD exacerbation (HCC)   COPD with acute exacerbation (HCC)   Acute respiratory failure with hypoxia (HCC)   Abdominal pain   Dementia without behavioral disturbance ([place])   Gastroesophageal reflux disease without esophagitis  Brief Hospital Course (including significant findings, care, treatment, and services provided and events leading to death)  Carlos Nelson is a 70 y.o. year old male who was admitted on 2020/06/11 for COPD exacerbation. Patient receiving bronchodilator therapy, mucolytics, steroids & Doxycycline. On 06/10/20 patient feeling more short of breath, with audible wheezing. ABG: 7.35/ 65/ 102/ 35.9. Solu-medrol IV increased to Q 6 h & BIPAP ordered QHS. Rapid response called later on in the evening due to increased WOB and continued SOB. Patient placed on BIPAP, repeat ABG: 7.28/ 81/ 127/ 38.1. Patient able to speak in complete sentences stating his breathing "feels better". Transferred to SDU for monitoring. Patient deteriorated after 4 hours on BIPAP with increased accessory muscle use, lethargy, tachycardia & hypotension requiring emergent intubation and mechanical ventilation. 06/12/20  patient was mildy improved and off vasopressor.  06/13/20 patient Liberated from MV on HFNC.  Patient was subsequently  transferred to step down unit. 06/14/20-patient reintubated with BIPAP failure, goal of care discussion with palliative in process.  Patient's family consented to terminal extubation and transition to comfort care due to end-stage COPD. He was extubated Jun 20, 2020 at 8:25 pm and passed away this morning at 0325am.  Pertinent Labs and Studies  Significant Diagnostic Studies DG Chest 2 View  Result Date: 06/10/2020 CLINICAL DATA:  Shortness of breath. EXAM: CHEST - 2 VIEW COMPARISON:  06/11/20. FINDINGS: The heart size and mediastinal contours are within normal limits. Both lungs are clear. The visualized skeletal structures are unremarkable. IMPRESSION: No active cardiopulmonary disease. Electronically Signed   By: Marijo Conception M.D.   On: 06/10/2020 08:50   DG Chest 2 View  Result Date: 06/11/20 CLINICAL DATA:  Dyspnea EXAM: CHEST - 2 VIEW COMPARISON:  06/04/2020 FINDINGS: The lungs are symmetrically hyperinflated in there is a paucity of vasculature, particularly within the lung apices in keeping with changes of at least moderate emphysema. No pneumothorax or pleural effusion. No superimposed confluent pulmonary infiltrate. Cardiac size within normal limits. Pulmonary vascularity is normal. No acute bone abnormality. IMPRESSION: No active cardiopulmonary disease.  Moderate emphysema. Electronically Signed   By: Fidela Salisbury MD   On: 06-11-2020 01:46   DG Chest 2 View  Result Date: 06/04/2020 CLINICAL DATA:  Shortness of breath. EXAM: CHEST - 2 VIEW COMPARISON:  None. FINDINGS: Heart size is normal. Lungs are hyperinflated. Lungs are clear. No edema or effusion is present. No focal airspace disease is present. Axial skeleton is unremarkable. IMPRESSION: 1. No acute cardiopulmonary disease. 2. Hyperinflation. Electronically Signed   By: San Morelle M.D.   On: 06/04/2020 13:14   DG Abd 1 View  Result Date: 06/11/2020 CLINICAL DATA:  Enteric catheter placement, history of abdominal pain EXAM: ABDOMEN - 1 VIEW COMPARISON:  06/08/2020 FINDINGS: Semi-erect  frontal view of the abdomen was obtained. The pelvis is  excluded by collimation. Enteric catheter tip projects over the gastric body. There is increasing gaseous distention of the small bowel within the central upper abdomen, which could reflect developing obstruction or ileus. No masses or abnormal calcifications. IMPRESSION: 1. Enteric catheter as above. 2. Progressive gaseous distention of the small bowel which may reflect obstruction or ileus. Electronically Signed   By: Randa Ngo M.D.   On: 06/11/2020 02:30   CT ANGIO CHEST PE W OR WO CONTRAST  Result Date: 06/11/2020 CLINICAL DATA:  Hypoxemic respiratory failure EXAM: CT ANGIOGRAPHY CHEST WITH CONTRAST TECHNIQUE: Multidetector CT imaging of the chest was performed using the standard protocol during bolus administration of intravenous contrast. Multiplanar CT image reconstructions and MIPs were obtained to evaluate the vascular anatomy. CONTRAST:  26mL OMNIPAQUE IOHEXOL 350 MG/ML SOLN COMPARISON:  CT chest, 01/31/2012, chest radiographs, 08/20/2018 FINDINGS: Cardiovascular: Satisfactory opacification of the pulmonary arteries to the segmental level. No evidence of pulmonary embolism. Normal heart size. Left coronary artery calcifications. No pericardial effusion. Mediastinum/Nodes: No enlarged mediastinal, hilar, or axillary lymph nodes. Thyroid gland, trachea, and esophagus demonstrate no significant findings. Lungs/Pleura: Endotracheal tube is positioned with tip in the midtrachea. Moderate to severe centrilobular emphysema. Diffuse bilateral bronchial wall thickening. Extensive, clustered centrilobular and ground-glass nodularity throughout the lower lungs (series 6, image 83). No pleural effusion or pneumothorax. Upper Abdomen: No acute abnormality. Esophagogastric tube with tip and side port below the diaphragm. Musculoskeletal: No chest wall abnormality. No acute or significant osseous findings. Unchanged, mild wedge deformities of T5 and T6. Review of the MIP images confirms the above findings.  IMPRESSION: 1. Negative examination for pulmonary embolism. 2. Extensive, clustered centrilobular and ground-glass nodularity throughout the lower lungs, consistent with infection or aspiration. 3. Emphysema. 4. Coronary artery disease. Emphysema (ICD10-J43.9). Electronically Signed   By: Eddie Candle M.D.   On: 06/11/2020 12:59   CT ABDOMEN PELVIS W CONTRAST  Result Date: 06/11/2020 CLINICAL DATA:  Bowel obstruction suspected EXAM: CT ABDOMEN AND PELVIS WITH CONTRAST TECHNIQUE: Multidetector CT imaging of the abdomen and pelvis was performed using the standard protocol following bolus administration of intravenous contrast. CONTRAST:  76mL OMNIPAQUE IOHEXOL 350 MG/ML SOLN, additional oral enteric contrast COMPARISON:  None. FINDINGS: Lower chest: No acute abnormality. Hepatobiliary: No solid liver abnormality is seen. Numerous gallstones in the gallbladder. No gallbladder wall thickening, or biliary dilatation. Pancreas: Unremarkable. No pancreatic ductal dilatation or surrounding inflammatory changes. Spleen: Normal in size without significant abnormality. Adrenals/Urinary Tract: Adrenal glands are unremarkable. Kidneys are normal, without renal calculi, solid lesion, or hydronephrosis. Foley catheter in the urinary bladder. Thickening of the urinary bladder wall, likely secondary to chronic outlet obstruction. Stomach/Bowel: Stomach is within normal limits. Appendix is not clearly visualized. Gas-filled, nondistended small bowel throughout the central abdomen. No evidence of bowel wall thickening, distention, or inflammatory changes. Large burden of stool in the right colon. Scattered stool present in the distal colon and rectum, with gas present to the rectum. Vascular/Lymphatic: Aortic atherosclerosis. No enlarged abdominal or pelvic lymph nodes. Reproductive: Prostatomegaly. Other: No abdominal wall hernia or abnormality. No abdominopelvic ascites. Musculoskeletal: No acute or significant osseous findings.  IMPRESSION: 1. No evidence of bowel obstruction. 2. Large burden of stool in the right colon. Scattered stool present in the distal colon and rectum, with gas present to the rectum. 3. Prostatomegaly. Thickening of the urinary bladder wall, likely secondary to chronic outlet obstruction. Foley catheter in the urinary bladder. 4. Cholelithiasis.  Aortic Atherosclerosis (ICD10-I70.0). Electronically Signed   By: Eddie Candle M.D.   On: 06/11/2020 13:09   DG Chest Port 1 View  Result Date: 06/13/2020 CLINICAL DATA:  70 year old male status post enteric and endotracheal tube placement. EXAM: PORTABLE ABDOMEN - 1 VIEW; PORTABLE CHEST - 1 VIEW COMPARISON:  Abdominal radiograph dated 12/18/2019 and CT dated 06/13/2020. FINDINGS: Endotracheal tube with tip approximately 3.8 cm above the carina. Left IJ central venous line with tip over central SVC. Enteric tube extends below the diaphragm with side-port just above the GE junction and tip in the left upper abdomen, in the proximal stomach. Recommend further advancing by additional 10 cm. No focal consolidation, pleural effusion, or pneumothorax. The cardiac silhouette is within limits. Top-normal caliber air-filled loop of small bowel in the left upper abdomen measures up to 2.7 cm. Contrast is noted within the colon. The osseous structures and soft tissues are grossly unremarkable. IMPRESSION: 1. Endotracheal tube above the carina. 2. Left IJ central venous line with tip over central SVC. 3. Enteric tube with side-port just above the GE junction and tip in the proximal stomach. Recommend further advancing of the tube. Electronically Signed   By: Anner Crete M.D.   On: 06/13/2020 17:29   DG Chest Port 1 View  Result Date: 06/11/2020 CLINICAL DATA:  Intubated, central line placement EXAM: PORTABLE CHEST 1 VIEW COMPARISON:  06/10/2020, 06/08/2020 FINDINGS: Two frontal views of the chest are obtained. Endotracheal tube overlies tracheal air column, tip well above  carina. Enteric catheter passes below diaphragm tip overlying gastric body. Left internal jugular catheter tip overlies superior vena cava. The cardiac silhouette is stable. Continued emphysema without airspace disease, effusion, or pneumothorax. No acute bony abnormalities. Distended gas-filled loops of bowel are seen over the central upper abdomen, increased in prominence since prior abdominal series. IMPRESSION: 1. Support devices as above, without evidence of complication. 2. Increasing gaseous distention of the bowel within the central upper abdomen. 3. Stable emphysema. Electronically Signed   By: Randa Ngo M.D.   On: 06/11/2020 02:29   DG Chest Port 1 View  Result Date: 06/10/2020 CLINICAL DATA:  Shortness of breath EXAM: PORTABLE CHEST 1 VIEW COMPARISON:  06/10/2020 FINDINGS: Cardiac shadow is stable. The lungs are hyperinflated bilaterally. No focal infiltrate or effusion is seen. Skin fold is noted over the left chest. No bony abnormality is noted. IMPRESSION: COPD without acute abnormality. Electronically Signed   By: Inez Catalina M.D.   On: 06/10/2020 21:07   DG Chest Portable 1 View  Result Date: 05/21/2020 CLINICAL DATA:  Shortness of breath. EXAM: PORTABLE CHEST 1 VIEW COMPARISON:  Chest x-ray 05/09/2020, CT chest 10/15/2019 FINDINGS: The heart size and mediastinal contours are unchanged. Aortic arch calcifications. Hyperinflation consistent with emphysematous changes. No focal consolidation. No pulmonary edema. Persistent blunting of bilateral costophrenic angles with no definite pleural effusion. No pneumothorax. No acute osseous abnormality. IMPRESSION: No active disease. Electronically Signed   By: Iven Finn M.D.   On: 05/21/2020 06:33   DG Abd 2 Views  Result Date: 06/08/2020 CLINICAL DATA:  Abdominal pain EXAM: ABDOMEN - 2 VIEW COMPARISON:  Prior abdominal radiograph 04/23/2020 FINDINGS: The bowel gas pattern is nonspecific. There is no evidence of gaseous dilation, however  gas is present within multiple loops of nondilated small bowel in the left mid abdomen. Hyperinflation of the visualized lower lungs again noted. Focal degenerative disc disease present at L4-L5. IMPRESSION: 1. Nonspecific bowel gas pattern. No significant dilation or obstruction but there is  gas within multiple loops of nondilated small bowel in the left mid abdomen. 2. Lower lumbar degenerative disc disease. Electronically Signed   By: Jacqulynn Cadet M.D.   On: 06/08/2020 11:08   DG Abd Portable 1V  Result Date: 06/13/2020 CLINICAL DATA:  70 year old male status post enteric and endotracheal tube placement. EXAM: PORTABLE ABDOMEN - 1 VIEW; PORTABLE CHEST - 1 VIEW COMPARISON:  Abdominal radiograph dated 12/18/2019 and CT dated 06/13/2020. FINDINGS: Endotracheal tube with tip approximately 3.8 cm above the carina. Left IJ central venous line with tip over central SVC. Enteric tube extends below the diaphragm with side-port just above the GE junction and tip in the left upper abdomen, in the proximal stomach. Recommend further advancing by additional 10 cm. No focal consolidation, pleural effusion, or pneumothorax. The cardiac silhouette is within limits. Top-normal caliber air-filled loop of small bowel in the left upper abdomen measures up to 2.7 cm. Contrast is noted within the colon. The osseous structures and soft tissues are grossly unremarkable. IMPRESSION: 1. Endotracheal tube above the carina. 2. Left IJ central venous line with tip over central SVC. 3. Enteric tube with side-port just above the GE junction and tip in the proximal stomach. Recommend further advancing of the tube. Electronically Signed   By: Anner Crete M.D.   On: 06/13/2020 17:29   ECHOCARDIOGRAM COMPLETE  Result Date: 05/23/2020    ECHOCARDIOGRAM REPORT   Patient Name:   Carlos Nelson Date of Exam: 05/23/2020 Medical Rec #:  [id number]         Height:       64.0 in Accession #:    [id number]        Weight:       126.7 lb  Date of Birth:  1950/05/02          BSA:          1.611 m Patient Age:    70 years          BP:           181/97 mmHg Patient Gender: M                 HR:           98 bpm. Exam Location:  [facility] Procedure: 2D Echo, Cardiac Doppler and Color Doppler Indications:     Dyspnea R06.00  History:         Patient has prior history of Echocardiogram examinations, most                  recent 06/09/2019. COPD; Risk Factors:Hypertension.  Sonographer:     Sherrie Sport RDCS (AE) Referring Phys:  [id number] RIPUDEEP K RAI Diagnosing Phys: Ida Rogue MD  Sonographer Comments: No apical window and no parasternal window. Image acquisition challenging due to COPD and Image acquisition challenging due to patient body habitus. IMPRESSIONS  1. Left ventricular ejection fraction, by estimation, is 60 to 65%. The left ventricle has normal function. The left ventricle has no regional wall motion abnormalities. There is moderate left ventricular hypertrophy. Left ventricular diastolic parameters are indeterminate.  2. Right ventricular systolic function is normal. The right ventricular size is normal. There is normal pulmonary artery systolic pressure. The estimated right ventricular systolic pressure is 37.6 mmHg. FINDINGS  Left Ventricle: Left ventricular ejection fraction, by estimation, is 60 to 65%. The left ventricle has normal function. The left ventricle has no regional wall motion abnormalities. The left ventricular internal cavity size was normal in size.  There is  moderate left ventricular hypertrophy. Left ventricular diastolic parameters are indeterminate. Right Ventricle: The right ventricular size is normal. No increase in right ventricular wall thickness. Right ventricular systolic function is normal. There is normal pulmonary artery systolic pressure. The tricuspid regurgitant velocity is 1.45 m/s, and  with an assumed right atrial pressure of 5 mmHg, the estimated right ventricular systolic pressure is 14.7 mmHg. Left Atrium:  Left atrial size was normal in size. Right Atrium: Right atrial size was normal in size. Pericardium: There is no evidence of pericardial effusion. Mitral Valve: The mitral valve is normal in structure. No evidence of mitral valve regurgitation. No evidence of mitral valve stenosis. Tricuspid Valve: The tricuspid valve is normal in structure. Tricuspid valve regurgitation is not demonstrated. No evidence of tricuspid stenosis. Aortic Valve: The aortic valve is normal in structure. Aortic valve regurgitation is not visualized. Mild aortic valve sclerosis is present, with no evidence of aortic valve stenosis. Pulmonic Valve: The pulmonic valve was normal in structure. Pulmonic valve regurgitation is not visualized. No evidence of pulmonic stenosis. Aorta: The aortic root is normal in size and structure. Venous: The inferior vena cava is normal in size with greater than 50% respiratory variability, suggesting right atrial pressure of 3 mmHg. IAS/Shunts: No atrial level shunt detected by color flow Doppler.  LEFT VENTRICLE PLAX 2D LVIDd:         3.26 cm LVIDs:         2.41 cm LV PW:         1.84 cm LV IVS:        1.18 cm  LEFT ATRIUM         Index LA diam:    4.10 cm 2.54 cm/m  PULMONIC VALVE PV Vmax:        1.17 m/s PV Peak grad:   5.5 mmHg RVOT Peak grad: 8 mmHg  TRICUSPID VALVE TR Peak grad:   8.4 mmHg TR Vmax:        145.00 cm/s Ida Rogue MD Electronically signed by Ida Rogue MD Signature Date/Time: 05/23/2020/2:50:23 PM    Final     Microbiology Recent Results (from the past 240 hour(s))  MRSA PCR Screening     Status: None   Collection Time: 06/11/20  1:51 AM   Specimen: Nasopharyngeal  Result Value Ref Range Status   MRSA by PCR NEGATIVE NEGATIVE Final    Comment:        The GeneXpert MRSA Assay (FDA approved for NASAL specimens only), is one component of a comprehensive MRSA colonization surveillance program. It is not intended to diagnose MRSA infection nor to guide or monitor  treatment for MRSA infections. Performed at [facility], [address] [place], [place] [address]     Lab Basic Metabolic Panel: Recent Labs  Lab 06/10/20 2352 06/12/20 0418 06/13/20 0406 06/13/20 1239 06/14/20 0405 06/15/20 0549  NA 134* 134* 139  --  142 146*  K 5.4* 5.2* 5.7* 5.4* 5.8* 5.4*  CL 92* 93* 96*  --  98 100  CO2 34* 34* 36*  --  38* 40*  GLUCOSE 134* 117* 113*  --  113* 135*  BUN 22 52* 51*  --  47* 50*  CREATININE 0.98 1.25* 0.90  --  1.09 1.10  CALCIUM 9.2 9.1 9.0  --  9.2 9.7  MG  --  2.5* 2.9*  --  2.7* 2.7*  PHOS  --  4.5 3.6  --  3.9 3.9   Liver Function  Tests: Recent Labs  Lab 06/10/20 2352 06/12/20 0418  AST 50* 27  ALT 50* 39  ALKPHOS 64 53  BILITOT 0.8 0.7  PROT 6.1* 5.8*  ALBUMIN 3.6 3.3*   No results for input(s): LIPASE, AMYLASE in the last 168 hours. No results for input(s): AMMONIA in the last 168 hours. CBC: Recent Labs  Lab 06/10/20 2352 06/12/20 0418 06/14/20 0405 06/15/20 0549  WBC 10.5 13.6* 13.5* 12.5*  NEUTROABS 9.8*  --   --  10.8*  HGB 13.5 12.3* 12.5* 12.3*  HCT 42.5 39.0 40.5 39.7  MCV 94.2 93.3 95.7 97.1  PLT 141* 161 200 180   Cardiac Enzymes: No results for input(s): CKTOTAL, CKMB, CKMBINDEX, TROPONINI in the last 168 hours. Sepsis Labs: Recent Labs  Lab 06/10/20 2352 06/12/20 0418 06/13/20 0406 06/14/20 0405 06/15/20 0549  PROCALCITON <0.10 0.20 0.19  --   --   WBC 10.5 13.6*  --  13.5* 12.5*    Procedures/Operations     Rufina Falco, DNP, CCRN, FNP-C, AGACNP-BC Acute Care Nurse Practitioner  [facility] Pager: [phone] [place] at [facility]

## 2020-07-15 NOTE — Progress Notes (Signed)
Patients niece Arsal Tappan notified of patients passing. She states that she will call the rest of the family, including her aunt [name].

## 2020-07-15 NOTE — Progress Notes (Signed)
Kamerin Grumbine called in to see if there is anything further needed. She did state that they would be using [facility]. She did express appreciation to all staff that assisted with the patients care.

## 2020-07-15 DEATH — deceased

## 2020-08-02 IMAGING — DX DG CHEST 1V PORT
1 series · 1 of 1 positions shown · non-contrast
Comparison: June 23, 2019

CLINICAL DATA: Shortness of breath.

EXAM:
PORTABLE CHEST 1 VIEW

[chest ap]
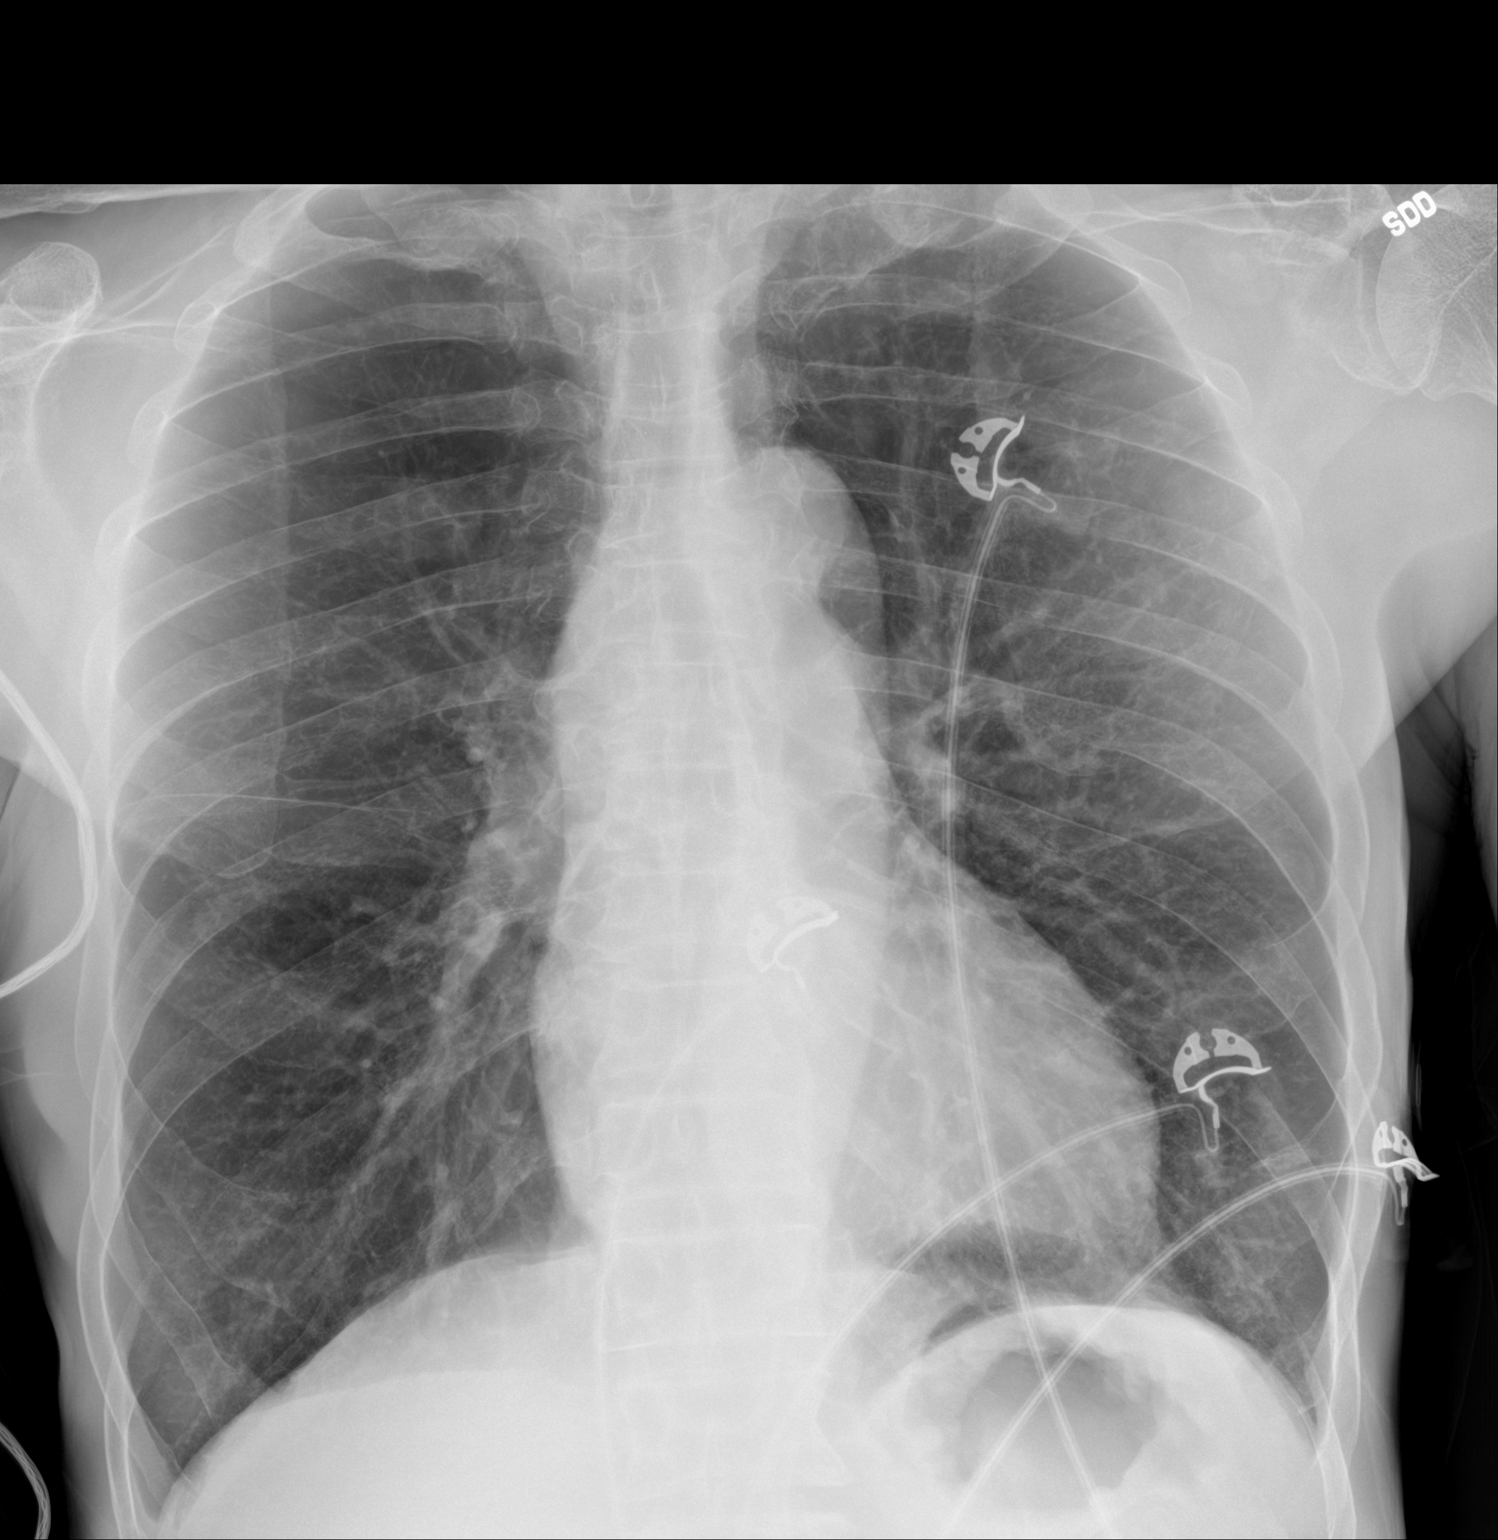

[1 of 1 positions shown; findings below may reference images not displayed]

FINDINGS: Emphysematous changes are noted bilaterally. The lungs are
hyperexpanded. The heart size is stable. Aortic calcifications are
noted. There is no pneumothorax. There is no focal infiltrate.
IMPRESSION: No active disease.

## 2020-08-31 ENCOUNTER — Other Ambulatory Visit: Payer: Self-pay

## 2020-09-02 ENCOUNTER — Ambulatory Visit: Payer: Medicare Other | Admitting: Urology

## 2020-09-02 ENCOUNTER — Ambulatory Visit: Payer: Self-pay | Admitting: Urology

## 2020-09-28 ENCOUNTER — Ambulatory Visit: Payer: Medicare Other | Admitting: Internal Medicine

## 2021-03-01 IMAGING — DX DG CHEST 1V PORT
1 series · 2 of 2 positions shown · non-contrast
Comparison: 02/20/2020

CLINICAL DATA: Shortness of breath.  History of COPD.

EXAM:
PORTABLE CHEST 1 VIEW

[Series 1: chest ap · 0.14mm/px · 2 of 2 slices shown]
[im 1/2]
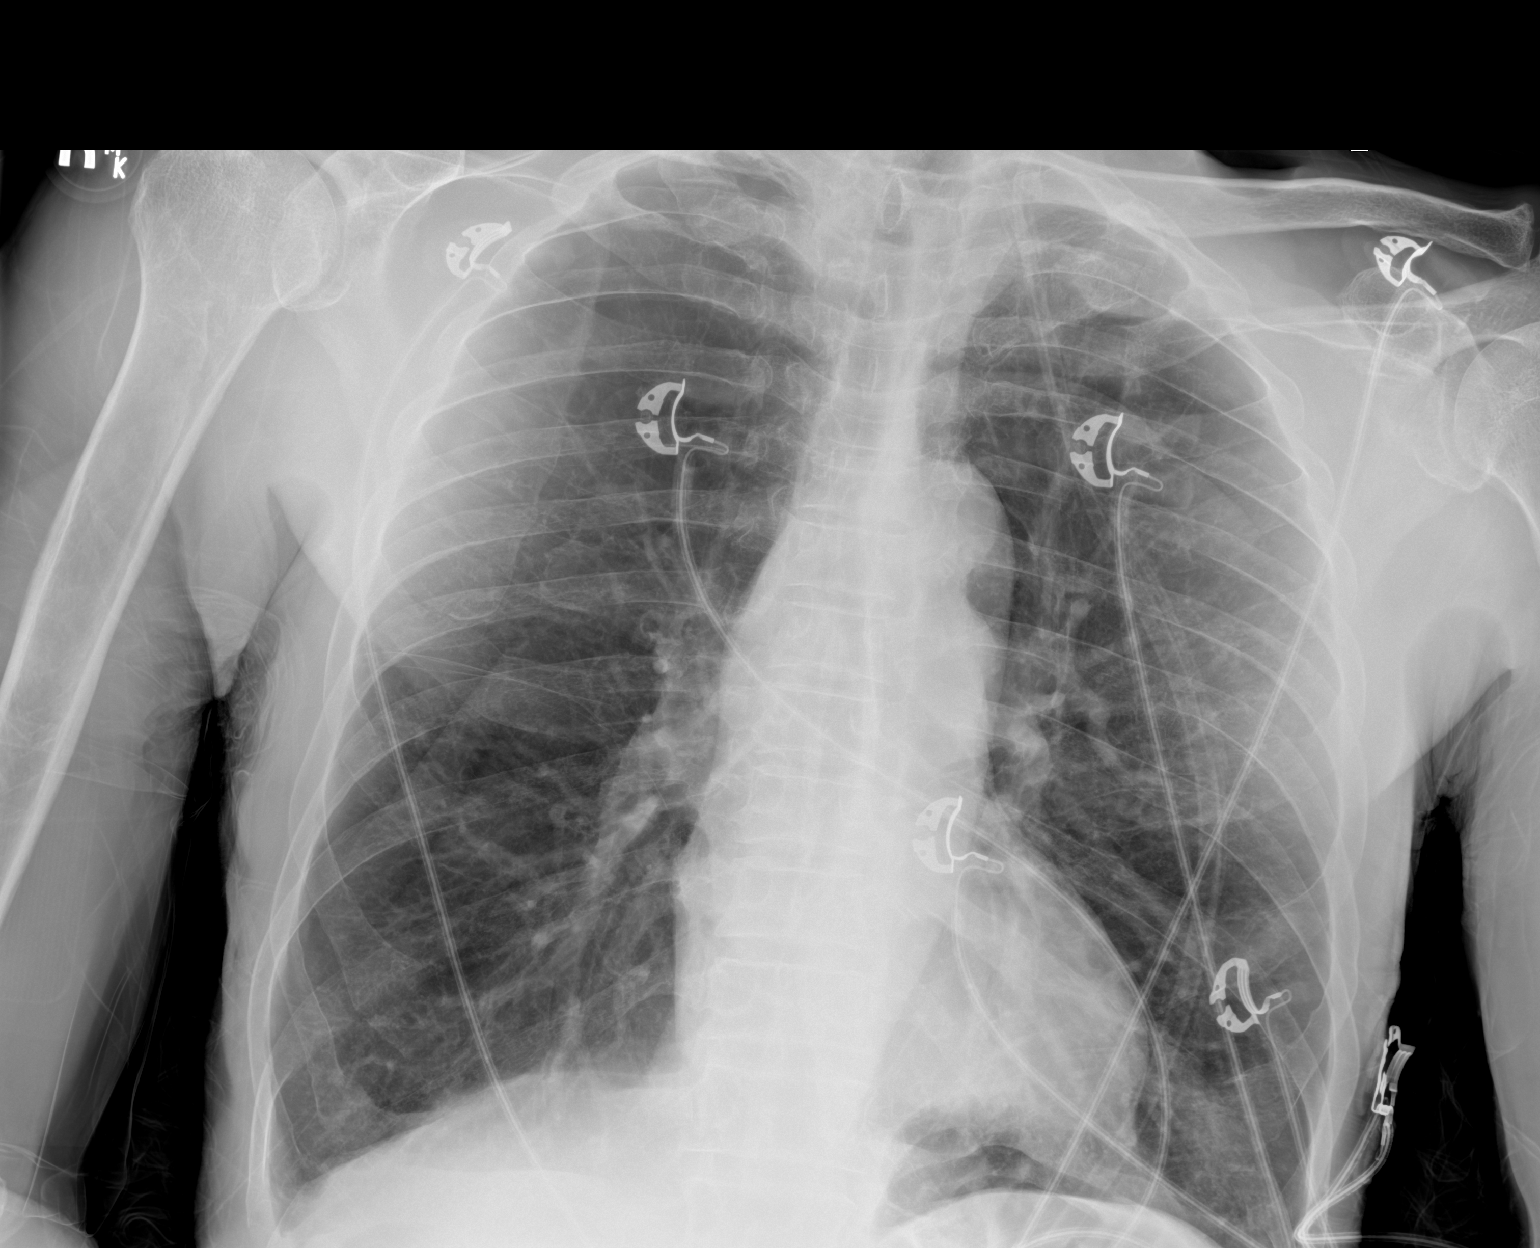
[im 2/2]
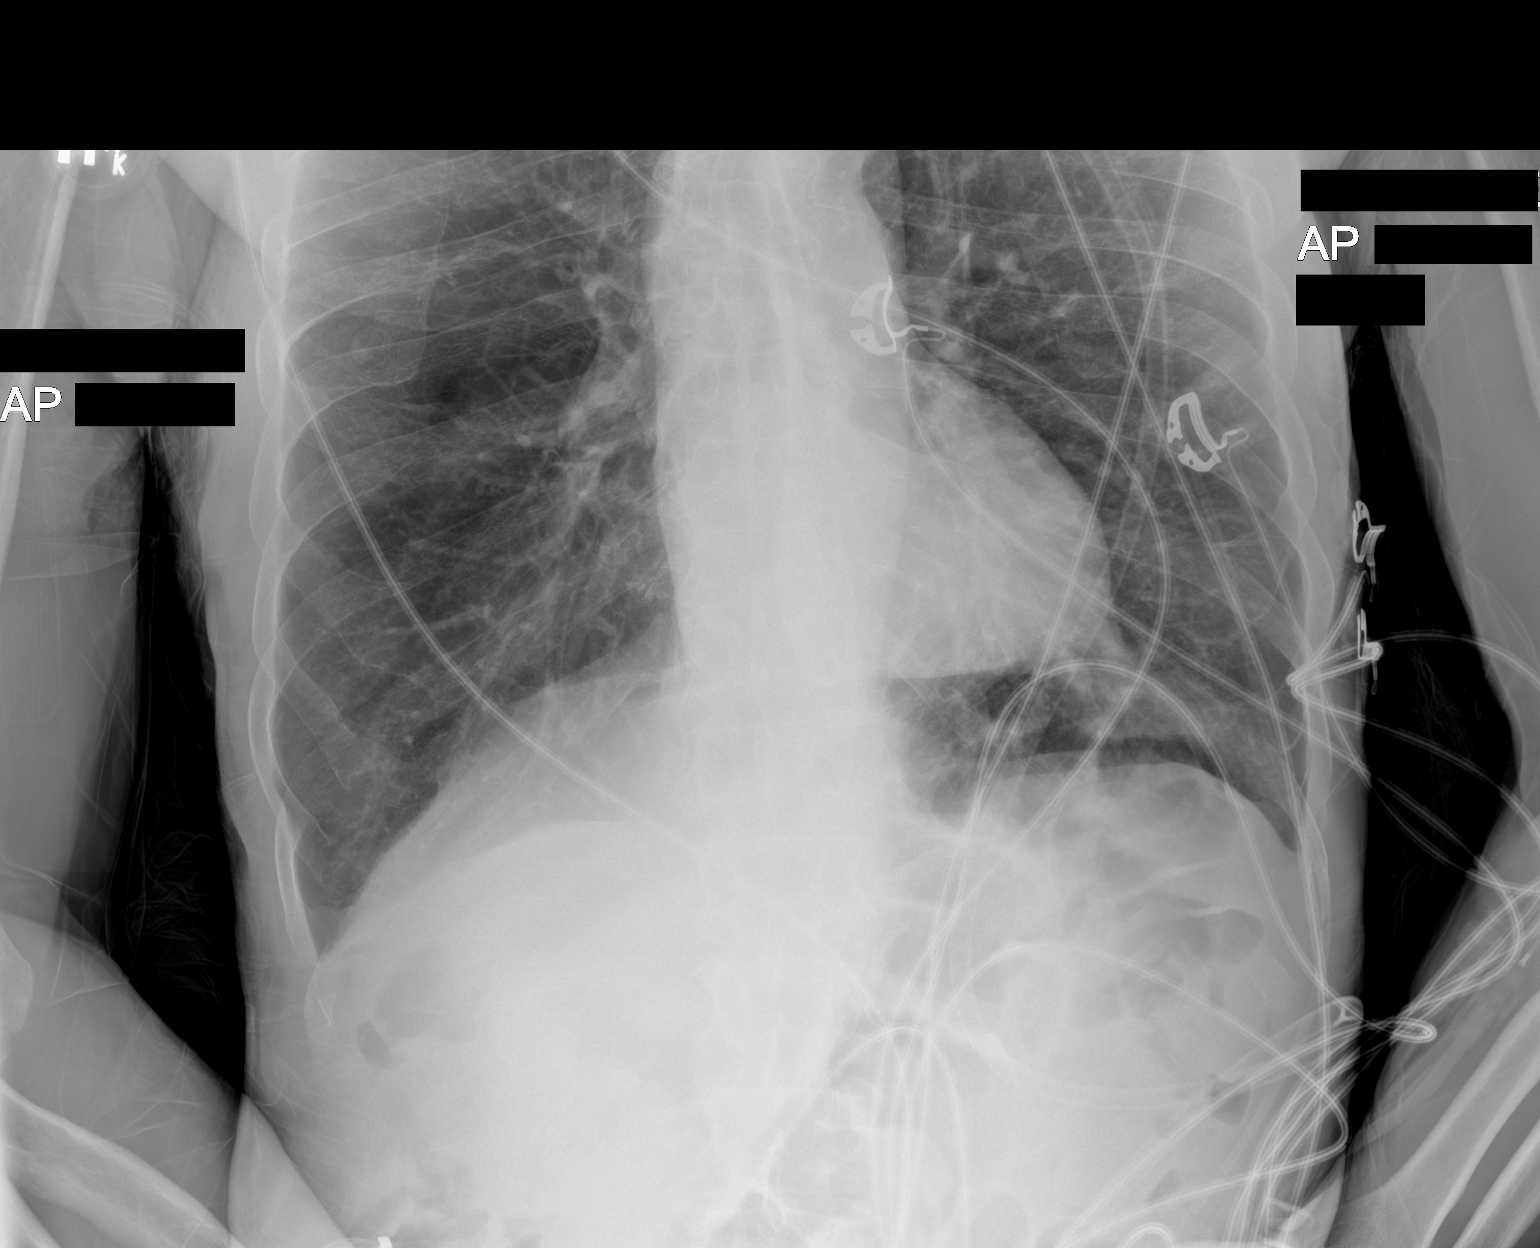

[2 of 2 positions shown; findings below may reference images not displayed]

FINDINGS: Chronic hyperinflation and apical predominant emphysema. No acute
airspace disease. Normal heart size and mediastinal contours. No
pleural fluid or pneumothorax. No acute osseous abnormalities are
seen.
IMPRESSION: Chronic hyperinflation and emphysema. No acute abnormality.
